# Patient Record
Sex: Female | Born: 1945 | Race: White | Hispanic: No | Marital: Married | Smoking: Former smoker
Health system: Southern US, Community
[De-identification: ages and names within clinical notes are randomized; demographics above are authoritative.]

## PROBLEM LIST (undated history)

## (undated) DIAGNOSIS — I700 Atherosclerosis of aorta: Secondary | ICD-10-CM

## (undated) DIAGNOSIS — R519 Headache, unspecified: Secondary | ICD-10-CM

## (undated) DIAGNOSIS — J189 Pneumonia, unspecified organism: Secondary | ICD-10-CM

## (undated) DIAGNOSIS — H3530 Unspecified macular degeneration: Secondary | ICD-10-CM

## (undated) DIAGNOSIS — Z720 Tobacco use: Secondary | ICD-10-CM

## (undated) DIAGNOSIS — J439 Emphysema, unspecified: Secondary | ICD-10-CM

## (undated) DIAGNOSIS — R0609 Other forms of dyspnea: Secondary | ICD-10-CM

## (undated) DIAGNOSIS — Z8669 Personal history of other diseases of the nervous system and sense organs: Secondary | ICD-10-CM

## (undated) DIAGNOSIS — G43909 Migraine, unspecified, not intractable, without status migrainosus: Secondary | ICD-10-CM

## (undated) DIAGNOSIS — T7840XA Allergy, unspecified, initial encounter: Secondary | ICD-10-CM

## (undated) DIAGNOSIS — F419 Anxiety disorder, unspecified: Secondary | ICD-10-CM

## (undated) DIAGNOSIS — E785 Hyperlipidemia, unspecified: Secondary | ICD-10-CM

## (undated) DIAGNOSIS — F32A Depression, unspecified: Secondary | ICD-10-CM

## (undated) DIAGNOSIS — I10 Essential (primary) hypertension: Secondary | ICD-10-CM

## (undated) DIAGNOSIS — F1910 Other psychoactive substance abuse, uncomplicated: Secondary | ICD-10-CM

## (undated) DIAGNOSIS — F319 Bipolar disorder, unspecified: Secondary | ICD-10-CM

## (undated) DIAGNOSIS — J449 Chronic obstructive pulmonary disease, unspecified: Secondary | ICD-10-CM

## (undated) DIAGNOSIS — M810 Age-related osteoporosis without current pathological fracture: Secondary | ICD-10-CM

## (undated) DIAGNOSIS — I2510 Atherosclerotic heart disease of native coronary artery without angina pectoris: Secondary | ICD-10-CM

## (undated) HISTORY — PX: WISDOM TOOTH EXTRACTION: SHX21

## (undated) HISTORY — PX: DILATION AND CURETTAGE OF UTERUS: SHX78

## (undated) HISTORY — PX: CATARACT EXTRACTION: SUR2

## (undated) HISTORY — PX: WRIST GANGLION EXCISION: SUR520

---

## 2004-09-15 ENCOUNTER — Ambulatory Visit: Payer: Self-pay | Admitting: Family Medicine

## 2005-10-05 ENCOUNTER — Ambulatory Visit: Payer: Self-pay | Admitting: Family Medicine

## 2006-11-08 ENCOUNTER — Ambulatory Visit: Payer: Self-pay | Admitting: Family Medicine

## 2009-01-07 ENCOUNTER — Ambulatory Visit: Payer: Self-pay | Admitting: Family Medicine

## 2010-01-12 ENCOUNTER — Ambulatory Visit: Payer: Self-pay | Admitting: Family Medicine

## 2011-01-18 ENCOUNTER — Ambulatory Visit: Payer: Self-pay | Admitting: Family Medicine

## 2011-01-31 ENCOUNTER — Ambulatory Visit: Payer: Self-pay | Admitting: Gastroenterology

## 2011-02-02 LAB — PATHOLOGY REPORT

## 2011-02-10 ENCOUNTER — Ambulatory Visit: Payer: Self-pay | Admitting: Neurology

## 2012-01-24 ENCOUNTER — Ambulatory Visit: Payer: Self-pay | Admitting: Internal Medicine

## 2012-01-25 ENCOUNTER — Ambulatory Visit: Payer: Self-pay | Admitting: Internal Medicine

## 2013-01-29 ENCOUNTER — Ambulatory Visit: Payer: Self-pay | Admitting: Internal Medicine

## 2013-10-07 DIAGNOSIS — F17200 Nicotine dependence, unspecified, uncomplicated: Secondary | ICD-10-CM | POA: Insufficient documentation

## 2013-10-07 DIAGNOSIS — N951 Menopausal and female climacteric states: Secondary | ICD-10-CM | POA: Insufficient documentation

## 2013-10-07 DIAGNOSIS — I10 Essential (primary) hypertension: Secondary | ICD-10-CM | POA: Insufficient documentation

## 2014-01-30 ENCOUNTER — Ambulatory Visit: Payer: Self-pay | Admitting: Family Medicine

## 2015-08-02 ENCOUNTER — Emergency Department: Payer: Medicare HMO

## 2015-08-02 ENCOUNTER — Emergency Department
Admission: EM | Admit: 2015-08-02 | Discharge: 2015-08-02 | Disposition: A | Discharge status: Home / Self Care | Payer: Medicare HMO | Attending: Emergency Medicine | Admitting: Emergency Medicine

## 2015-08-02 DIAGNOSIS — R000 Tachycardia, unspecified: Secondary | ICD-10-CM | POA: Insufficient documentation

## 2015-08-02 DIAGNOSIS — J159 Unspecified bacterial pneumonia: Secondary | ICD-10-CM | POA: Diagnosis not present

## 2015-08-02 DIAGNOSIS — J189 Pneumonia, unspecified organism: Secondary | ICD-10-CM

## 2015-08-02 DIAGNOSIS — R0602 Shortness of breath: Secondary | ICD-10-CM | POA: Diagnosis present

## 2015-08-02 LAB — COMPREHENSIVE METABOLIC PANEL
ALBUMIN: 4.1 g/dL (ref 3.5–5.0)
ALK PHOS: 85 U/L (ref 38–126)
ALT: 23 U/L (ref 14–54)
AST: 30 U/L (ref 15–41)
Anion gap: 10 (ref 5–15)
BUN: 11 mg/dL (ref 6–20)
CALCIUM: 9.2 mg/dL (ref 8.9–10.3)
CHLORIDE: 98 mmol/L — AB (ref 101–111)
CO2: 26 mmol/L (ref 22–32)
CREATININE: 0.74 mg/dL (ref 0.44–1.00)
GFR calc non Af Amer: >60 mL/min (ref >60)
GLUCOSE: 105 mg/dL — AB (ref 65–99)
Potassium: 4 mmol/L (ref 3.5–5.1)
SODIUM: 134 mmol/L — AB (ref 135–145)
Total Bilirubin: 0.6 mg/dL (ref 0.3–1.2)
Total Protein: 8.2 g/dL — ABNORMAL HIGH (ref 6.5–8.1)

## 2015-08-02 LAB — CBC
HCT: 38.5 % (ref 35.0–47.0)
HEMOGLOBIN: 13.4 g/dL (ref 12.0–16.0)
MCH: 35.1 pg — AB (ref 26.0–34.0)
MCHC: 34.9 g/dL (ref 32.0–36.0)
MCV: 100.7 fL — ABNORMAL HIGH (ref 80.0–100.0)
PLATELETS: 339 10*3/uL (ref 150–440)
RBC: 3.82 MIL/uL (ref 3.80–5.20)
RDW: 14.3 % (ref 11.5–14.5)
WBC: 10.9 10*3/uL (ref 3.6–11.0)

## 2015-08-02 LAB — TROPONIN I: Troponin I: 0.03 ng/mL (ref <0.031)

## 2015-08-02 LAB — BRAIN NATRIURETIC PEPTIDE: B Natriuretic Peptide: 63 pg/mL (ref 0.0–100.0)

## 2015-08-02 MED ORDER — LEVOFLOXACIN IN D5W 500 MG/100ML IV SOLN
500.0000 mg | Freq: Once | INTRAVENOUS | Status: AC
  Administered 2015-08-02: 500 mg via INTRAVENOUS
  Filled 2015-08-02: qty 100

## 2015-08-02 MED ORDER — LEVOFLOXACIN 500 MG PO TABS: 500.0000 mg | ORAL_TABLET | Freq: Every day | ORAL | Status: AC

## 2015-08-02 MED ORDER — IPRATROPIUM-ALBUTEROL 0.5-2.5 (3) MG/3ML IN SOLN
3.0000 mL | Freq: Once | RESPIRATORY_TRACT | Status: AC
  Administered 2015-08-02: 3 mL via RESPIRATORY_TRACT
  Filled 2015-08-02: qty 3

## 2015-08-02 MED ORDER — SODIUM CHLORIDE 0.9 % IV SOLN
1000.0000 mL | Freq: Once | INTRAVENOUS | Status: AC
  Administered 2015-08-02: 1000 mL via INTRAVENOUS

## 2015-08-02 NOTE — Discharge Instructions (Signed)

## 2015-08-02 NOTE — ED Notes (Addendum)
Pt reports since Jan. 5th SOB that increases with activity. Pt also reports heart palpations but denies pain. Denies fever. Cough with green sputum.

## 2015-08-02 NOTE — ED Notes (Signed)
Pt was assisted to the bathroom. 

## 2015-08-02 NOTE — ED Provider Notes (Signed)
[facility] Emergency Department Provider Note  ____________________________________________    I have reviewed the triage vital signs and the nursing notes.   HISTORY  Chief Complaint Shortness of Breath and Palpitations    HPI Taylor Chambers is a 70 y.o. female who presents with cough shortness of breath and fatigue and heart palpitations. She reports the last month she has had upper respiratory infection which her PCP treated with steroids but she reports over the last week she has become worse and become short of breath with ambulation. She denies chest pain. She does report cough. She does smoke cigarettes. Subjective fevers. No recent Travel or calf pain or swelling.     No past medical history on file.  There are no active problems to display for this patient.   No past surgical history on file.  No current outpatient prescriptions on file.  Allergies Review of patient's allergies indicates no known allergies.  No family history on file.  Social History Patient smokes cigarettes, denies alcohol use  Review of Systems  Constitutional: Subjective fever Eyes: Negative for discharge ENT: Negative for sore throat Cardiovascular: Negative for chest pain. Respiratory: Positive for shortness of breath. Positive for cough  Gastrointestinal: Negative for abdominal pain, vomiting  Genitourinary: Negative for dysuria. Musculoskeletal: Negative for back pain. Skin: Negative for rash. Neurological: Negative for headaches     ____________________________________________   PHYSICAL EXAM:  VITAL SIGNS: ED Triage Vitals  Enc Vitals Group     BP 08/02/15 0849 174/71 mmHg     Pulse Rate 08/02/15 0849 125     Resp 08/02/15 0849 22     Temp 08/02/15 0849 98.1 F (36.7 C)     Temp Source 08/02/15 0849 Oral     SpO2 08/02/15 0849 97 %     Weight 08/02/15 0849 100 lb 6 oz (45.53 kg)     Height 08/02/15 0849 5\' 2"  (1.575 m)     Head Cir --       Peak Flow --      Pain Score 08/02/15 0853 9     Pain Loc --      Pain Edu? --      Excl. in [place]? --      Constitutional: Alert and oriented. Well appearing and in no distress. Eyes: Conjunctivae are normal.  ENT   Head: Normocephalic and atraumatic.   Mouth/Throat: Mucous membranes are moist. Cardiovascular: Tachycardia, regular rhythm. Normal and symmetric distal pulses are present in all extremities. No murmurs, rubs, or gallops. Respiratory: Normal respiratory effort without tachypnea nor retractions. Scattered mild wheezes Gastrointestinal: Soft and non-tender in all quadrants. No distention. There is no CVA tenderness. Genitourinary: deferred Musculoskeletal: Nontender with normal range of motion in all extremities. No lower extremity tenderness nor edema. Neurologic:  Normal speech and language. No gross focal neurologic deficits are appreciated. Skin:  Skin is warm, dry and intact. No rash noted. Psychiatric: Mood and affect are normal. Patient exhibits appropriate insight and judgment.  ____________________________________________    LABS (pertinent positives/negatives)  Labs Reviewed  CBC - Abnormal; Notable for the following:    MCV 100.7 (*)    MCH 35.1 (*)    All other components within normal limits  COMPREHENSIVE METABOLIC PANEL  TROPONIN I  BRAIN NATRIURETIC PEPTIDE    ____________________________________________   EKG    ED ECG REPORT I, Lavonia Drafts, the attending physician, personally viewed and interpreted this ECG.  Date: 08/02/2015 EKG Time: 8:57 AM Rate: 114 Rhythm: normal sinus  rhythm QRS Axis: normal Intervals: normal ST/T Wave abnormalities: Nonspecific changes Conduction Disturbances: none Narrative Interpretation: unremarkable   ____________________________________________    RADIOLOGY I have personally reviewed any xrays that were ordered on this patient: Chest x-ray concerning for  pneumonia  ____________________________________________   PROCEDURES  Procedure(s) performed: none  Critical Care performed: none  ____________________________________________   INITIAL IMPRESSION / ASSESSMENT AND PLAN / ED COURSE  Pertinent labs & imaging results that were available during my care of the patient were reviewed by me and considered in my medical decision making (see chart for details).  Patient presents with complaints of symptoms of upper respiratory infection for approximately one month. Appears to worsened over the last week. She does have a severe cough. We will check labs, chest x-ray given concerning for pneumonia versus bronchospasm versus bronchitis.  ----------------------------------------- 10:16 AM on 08/02/2015 -----------------------------------------  Chest x-ray concerning for pneumonia, labs unremarkable. Recommended admission to the patient however she refuses. She wants to try outpatient antibiotics. She did agree to a dose of IV Levaquin in the emergency department.  ----------------------------------------- 11:43 AM on 08/02/2015 -----------------------------------------  Patient has finished IV antibiotics. Her heart rate remains slightly high. I reiterated that I would like her to stay in the hospital and she again declined. We did discuss return precautions at length.  ____________________________________________   FINAL CLINICAL IMPRESSION(S) / ED DIAGNOSES  Final diagnoses:  Community acquired pneumonia     Lavonia Drafts, MD 08/02/15 1147

## 2016-01-26 ENCOUNTER — Other Ambulatory Visit: Payer: Self-pay | Admitting: Internal Medicine

## 2016-01-26 DIAGNOSIS — Z1231 Encounter for screening mammogram for malignant neoplasm of breast: Secondary | ICD-10-CM

## 2016-02-02 ENCOUNTER — Ambulatory Visit
Admission: RE | Admit: 2016-02-02 | Discharge: 2016-02-02 | Disposition: A | Discharge status: Home / Self Care | Payer: Medicare HMO | Source: Ambulatory Visit | Attending: Internal Medicine | Admitting: Internal Medicine

## 2016-02-02 ENCOUNTER — Other Ambulatory Visit: Payer: Self-pay | Admitting: Internal Medicine

## 2016-02-02 DIAGNOSIS — Z1231 Encounter for screening mammogram for malignant neoplasm of breast: Secondary | ICD-10-CM

## 2016-04-05 ENCOUNTER — Telehealth: Payer: Self-pay | Admitting: *Deleted

## 2016-04-05 NOTE — Telephone Encounter (Signed)
Received referral for initial lung cancer screening scan. Contacted patient and obtained smoking history,(current, 45 pack year) as well as answering questions related to screening process. Patient denies signs of lung cancer such as weight loss or hemoptysis. Patient denies comorbidity that would prevent curative treatment if lung cancer were found. Patient is tentatively scheduled for shared decision making visit and CT scan on 04/12/16 at 1:30pm, pending insurance approval from business office.

## 2016-04-11 ENCOUNTER — Other Ambulatory Visit: Payer: Self-pay | Admitting: *Deleted

## 2016-04-11 DIAGNOSIS — Z87891 Personal history of nicotine dependence: Secondary | ICD-10-CM

## 2016-04-12 ENCOUNTER — Inpatient Hospital Stay: Payer: Medicare HMO | Attending: Oncology | Admitting: Oncology

## 2016-04-12 ENCOUNTER — Encounter: Payer: Self-pay | Admitting: Oncology

## 2016-04-12 ENCOUNTER — Ambulatory Visit
Admission: RE | Admit: 2016-04-12 | Discharge: 2016-04-12 | Disposition: A | Discharge status: Home / Self Care | Payer: Medicare HMO | Source: Ambulatory Visit | Attending: Oncology | Admitting: Oncology

## 2016-04-12 DIAGNOSIS — I700 Atherosclerosis of aorta: Secondary | ICD-10-CM | POA: Diagnosis not present

## 2016-04-12 DIAGNOSIS — I2510 Atherosclerotic heart disease of native coronary artery without angina pectoris: Secondary | ICD-10-CM | POA: Insufficient documentation

## 2016-04-12 DIAGNOSIS — Z122 Encounter for screening for malignant neoplasm of respiratory organs: Secondary | ICD-10-CM

## 2016-04-12 DIAGNOSIS — F17210 Nicotine dependence, cigarettes, uncomplicated: Secondary | ICD-10-CM

## 2016-04-12 DIAGNOSIS — Z87891 Personal history of nicotine dependence: Secondary | ICD-10-CM

## 2016-04-12 DIAGNOSIS — J432 Centrilobular emphysema: Secondary | ICD-10-CM | POA: Diagnosis not present

## 2016-04-13 ENCOUNTER — Telehealth: Payer: Self-pay | Admitting: *Deleted

## 2016-04-13 NOTE — Telephone Encounter (Signed)
Notified patient of LDCT lung cancer screening results with recommendation for 1 month follow up imaging. Also notified of incidental finding noted below. Will coordinate 1 month imaging scan. This note will be sent to PCP via Epic. Patient verbalizes understanding.   IMPRESSION: 1. Scattered areas of nodular consolidation are likely infectious/inflammatory, given the presence of peribronchial thickening, scattered peribronchovascular nodularity mucoid impaction. However, malignancy cannot be excluded and based on size, study is categorized as Lung-RADS Category 4B, suspicious. Consider short-term follow-up low-dose CT chest in 3-4 weeks in further initial evaluation. If persistent, pulmonary medicine or thoracic surgery consultation should be considered. These results will be called to the ordering clinician or representative by the Radiologist Assistant, and communication documented in the PACS or zVision Dashboard. 2. Aortic atherosclerosis (ICD10-170.0). Coronary artery calcification. 3.  Emphysema (ICD10-J43.9).

## 2016-04-17 DIAGNOSIS — Z87891 Personal history of nicotine dependence: Secondary | ICD-10-CM | POA: Insufficient documentation

## 2016-04-17 NOTE — Progress Notes (Signed)
In accordance with CMS guidelines, patient has met eligibility criteria including age, absence of signs or symptoms of lung cancer.  Social History  Substance Use Topics  . Smoking status: Current Every Day Smoker    Packs/day: 1.00    Years: 45.00    Types: Cigarettes  . Smokeless tobacco: Not on file  . Alcohol use Not on file     A shared decision-making session was conducted prior to the performance of CT scan. This includes one or more decision aids, includes benefits and harms of screening, follow-up diagnostic testing, over-diagnosis, false positive rate, and total radiation exposure.  Counseling on the importance of adherence to annual lung cancer LDCT screening, impact of co-morbidities, and ability or willingness to undergo diagnosis and treatment is imperative for compliance of the program.  Counseling on the importance of continued smoking cessation for former smokers; the importance of smoking cessation for current smokers, and information about tobacco cessation interventions have been given to patient including [place] and [employer] quit [place] programs.  Written order for lung cancer screening with LDCT has been given to the patient and any and all questions have been answered to the best of my abilities.   Yearly follow up will be coordinated by Burgess Estelle, Thoracic Navigator.

## 2016-05-03 ENCOUNTER — Other Ambulatory Visit: Payer: Self-pay | Admitting: *Deleted

## 2016-05-03 DIAGNOSIS — R9389 Abnormal findings on diagnostic imaging of other specified body structures: Secondary | ICD-10-CM

## 2016-05-10 ENCOUNTER — Ambulatory Visit
Admission: RE | Admit: 2016-05-10 | Discharge: 2016-05-10 | Disposition: A | Discharge status: Home / Self Care | Payer: Medicare HMO | Source: Ambulatory Visit | Attending: Oncology | Admitting: Oncology

## 2016-05-10 ENCOUNTER — Ambulatory Visit: Admission: RE | Admit: 2016-05-10 | Payer: Medicare HMO | Source: Ambulatory Visit

## 2016-05-10 DIAGNOSIS — I2510 Atherosclerotic heart disease of native coronary artery without angina pectoris: Secondary | ICD-10-CM | POA: Insufficient documentation

## 2016-05-10 DIAGNOSIS — R938 Abnormal findings on diagnostic imaging of other specified body structures: Secondary | ICD-10-CM | POA: Insufficient documentation

## 2016-05-10 DIAGNOSIS — I700 Atherosclerosis of aorta: Secondary | ICD-10-CM | POA: Diagnosis not present

## 2016-05-10 DIAGNOSIS — J432 Centrilobular emphysema: Secondary | ICD-10-CM | POA: Diagnosis not present

## 2016-05-10 DIAGNOSIS — R9389 Abnormal findings on diagnostic imaging of other specified body structures: Secondary | ICD-10-CM

## 2016-05-13 ENCOUNTER — Telehealth: Payer: Self-pay | Admitting: *Deleted

## 2016-05-13 NOTE — Telephone Encounter (Signed)
Notified patient of LDCT lung cancer screening results with recommendation for 6 month follow up imaging. Also notified of incidental finding noted below. Patient verbalizes understanding. This note will be forwarded to PCP via Epic.  IMPRESSION: 1. Lung-Rads category 3, probably benign findings. Short-term follow-up in 6 months is recommended with repeat low-dose chest CT without contrast (please use the following order, "CT CHEST LCS NODULE FOLLOW-UP W/O CM"). Improvement in multifocal pulmonary opacities which are likely post infectious or inflammatory. 2. Underlying centrilobular emphysema. 3.  Coronary artery atherosclerosis. Aortic atherosclerosis.

## 2016-11-03 ENCOUNTER — Telehealth: Payer: Self-pay | Admitting: *Deleted

## 2016-11-03 NOTE — Telephone Encounter (Signed)
Left message for patient to notify them that it is time to schedule annual low dose lung cancer screening CT scan. Instructed patient to call back to verify information prior to the scan being scheduled.  

## 2016-11-04 ENCOUNTER — Telehealth: Payer: Self-pay | Admitting: *Deleted

## 2016-11-04 DIAGNOSIS — R9389 Abnormal findings on diagnostic imaging of other specified body structures: Secondary | ICD-10-CM

## 2016-11-04 NOTE — Telephone Encounter (Signed)
Notified patient that annual lung cancer screening low dose CT scan is due currently or will be in near future. Confirmed that patient is within the age range of 55-77, and asymptomatic, (no signs or symptoms of lung cancer). Patient denies illness that would prevent curative treatment for lung cancer if found. Verified smoking history, (current, 45.5 pack year). The shared decision making visit was done 04/12/16. Patient is agreeable for CT scan being scheduled.

## 2016-11-23 ENCOUNTER — Ambulatory Visit
Admission: RE | Admit: 2016-11-23 | Discharge: 2016-11-23 | Disposition: A | Discharge status: Home / Self Care | Payer: Medicare HMO | Source: Ambulatory Visit | Attending: Oncology | Admitting: Oncology

## 2016-11-23 ENCOUNTER — Ambulatory Visit: Payer: Medicare HMO

## 2016-11-23 DIAGNOSIS — I700 Atherosclerosis of aorta: Secondary | ICD-10-CM | POA: Insufficient documentation

## 2016-11-23 DIAGNOSIS — R938 Abnormal findings on diagnostic imaging of other specified body structures: Secondary | ICD-10-CM | POA: Diagnosis not present

## 2016-11-23 DIAGNOSIS — I2510 Atherosclerotic heart disease of native coronary artery without angina pectoris: Secondary | ICD-10-CM | POA: Diagnosis not present

## 2016-11-23 DIAGNOSIS — J432 Centrilobular emphysema: Secondary | ICD-10-CM | POA: Insufficient documentation

## 2016-11-23 DIAGNOSIS — R9389 Abnormal findings on diagnostic imaging of other specified body structures: Secondary | ICD-10-CM

## 2016-11-28 ENCOUNTER — Encounter: Payer: Self-pay | Admitting: *Deleted

## 2017-03-16 ENCOUNTER — Other Ambulatory Visit: Payer: Self-pay | Admitting: Ophthalmology

## 2017-03-16 DIAGNOSIS — G453 Amaurosis fugax: Secondary | ICD-10-CM

## 2017-03-22 ENCOUNTER — Ambulatory Visit
Admission: RE | Admit: 2017-03-22 | Discharge: 2017-03-22 | Disposition: A | Discharge status: Home / Self Care | Payer: Medicare HMO | Source: Ambulatory Visit | Attending: Ophthalmology | Admitting: Ophthalmology

## 2017-03-22 DIAGNOSIS — G453 Amaurosis fugax: Secondary | ICD-10-CM | POA: Diagnosis present

## 2017-03-22 LAB — POCT I-STAT CREATININE: Creatinine, Ser: 1.1 mg/dL — ABNORMAL HIGH (ref 0.44–1.00)

## 2017-03-22 MED ORDER — GADOBENATE DIMEGLUMINE 529 MG/ML IV SOLN
10.0000 mL | Freq: Once | INTRAVENOUS | Status: AC | PRN
  Administered 2017-03-22: 10 mL via INTRAVENOUS

## 2017-11-14 ENCOUNTER — Telehealth: Payer: Self-pay | Admitting: *Deleted

## 2017-11-14 DIAGNOSIS — Z87891 Personal history of nicotine dependence: Secondary | ICD-10-CM

## 2017-11-14 DIAGNOSIS — Z122 Encounter for screening for malignant neoplasm of respiratory organs: Secondary | ICD-10-CM

## 2017-11-14 NOTE — Telephone Encounter (Signed)
Notified patient that annual lung cancer screening low dose CT scan is due currently or will be in near future. Confirmed that patient is within the age range of 55-77, and asymptomatic, (no signs or symptoms of lung cancer). Patient denies illness that would prevent curative treatment for lung cancer if found. Verified smoking history, (former, quit 08/04/17). The shared decision making visit was done 04/12/16. Patient is agreeable for CT scan being scheduled.

## 2017-11-15 ENCOUNTER — Telehealth: Payer: Self-pay | Admitting: *Deleted

## 2017-11-15 NOTE — Telephone Encounter (Signed)
Pt notified and appt details mailed. Also received email.

## 2017-11-27 ENCOUNTER — Ambulatory Visit
Admission: RE | Admit: 2017-11-27 | Discharge: 2017-11-27 | Disposition: A | Discharge status: Home / Self Care | Payer: Medicare HMO | Source: Ambulatory Visit | Attending: Oncology | Admitting: Oncology

## 2017-11-27 DIAGNOSIS — I2510 Atherosclerotic heart disease of native coronary artery without angina pectoris: Secondary | ICD-10-CM | POA: Diagnosis not present

## 2017-11-27 DIAGNOSIS — Z87891 Personal history of nicotine dependence: Secondary | ICD-10-CM | POA: Diagnosis not present

## 2017-11-27 DIAGNOSIS — J439 Emphysema, unspecified: Secondary | ICD-10-CM | POA: Diagnosis not present

## 2017-11-27 DIAGNOSIS — Z122 Encounter for screening for malignant neoplasm of respiratory organs: Secondary | ICD-10-CM | POA: Insufficient documentation

## 2017-11-27 DIAGNOSIS — I700 Atherosclerosis of aorta: Secondary | ICD-10-CM | POA: Insufficient documentation

## 2017-12-04 ENCOUNTER — Encounter: Payer: Self-pay | Admitting: *Deleted

## 2018-12-05 ENCOUNTER — Telehealth: Payer: Self-pay | Admitting: *Deleted

## 2018-12-05 DIAGNOSIS — Z87891 Personal history of nicotine dependence: Secondary | ICD-10-CM

## 2018-12-05 DIAGNOSIS — Z122 Encounter for screening for malignant neoplasm of respiratory organs: Secondary | ICD-10-CM

## 2018-12-05 NOTE — Telephone Encounter (Signed)
Patient has been notified that annual lung cancer screening low dose CT scan is due currently or will be in near future. Confirmed that patient is within the age range of 55-77, and asymptomatic, (no signs or symptoms of lung cancer). Patient denies illness that would prevent curative treatment for lung cancer if found. Verified smoking history, (current, 46 pack year). The shared decision making visit was done 04/12/16. Patient is agreeable for CT scan being scheduled.

## 2018-12-11 ENCOUNTER — Ambulatory Visit
Admission: RE | Admit: 2018-12-11 | Discharge: 2018-12-11 | Disposition: A | Discharge status: Home / Self Care | Payer: Medicare HMO | Source: Ambulatory Visit | Attending: Oncology | Admitting: Oncology

## 2018-12-11 ENCOUNTER — Other Ambulatory Visit: Payer: Self-pay

## 2018-12-11 DIAGNOSIS — Z122 Encounter for screening for malignant neoplasm of respiratory organs: Secondary | ICD-10-CM | POA: Diagnosis not present

## 2018-12-11 DIAGNOSIS — Z87891 Personal history of nicotine dependence: Secondary | ICD-10-CM

## 2018-12-12 ENCOUNTER — Encounter: Payer: Self-pay | Admitting: *Deleted

## 2019-02-05 DIAGNOSIS — J432 Centrilobular emphysema: Secondary | ICD-10-CM | POA: Insufficient documentation

## 2019-02-06 ENCOUNTER — Other Ambulatory Visit: Payer: Self-pay | Admitting: Infectious Diseases

## 2019-02-06 DIAGNOSIS — Z1231 Encounter for screening mammogram for malignant neoplasm of breast: Secondary | ICD-10-CM

## 2019-03-15 ENCOUNTER — Ambulatory Visit
Admission: RE | Admit: 2019-03-15 | Discharge: 2019-03-15 | Disposition: A | Discharge status: Home / Self Care | Payer: Medicare HMO | Source: Ambulatory Visit | Attending: Infectious Diseases | Admitting: Infectious Diseases

## 2019-03-15 DIAGNOSIS — Z1231 Encounter for screening mammogram for malignant neoplasm of breast: Secondary | ICD-10-CM | POA: Insufficient documentation

## 2019-12-06 ENCOUNTER — Telehealth: Payer: Self-pay

## 2019-12-06 DIAGNOSIS — Z122 Encounter for screening for malignant neoplasm of respiratory organs: Secondary | ICD-10-CM

## 2019-12-06 DIAGNOSIS — Z87891 Personal history of nicotine dependence: Secondary | ICD-10-CM

## 2019-12-06 NOTE — Telephone Encounter (Signed)
Patient has been notified that the low dose lung cancer screening CT scan is due currently or will be in near future.  Confirmed that patient is within the appropriate age range and asymptomatic, (no signs or symptoms of lung cancer).  Patient denies illness that would prevent curative treatment for lung cancer if found.  Patient is agreeable for CT scan being scheduled.    Verified smoking history (current smoker, with 46 year 1 ppd history).   CT scheduled for 12/17/19 @ 9:30

## 2019-12-10 NOTE — Telephone Encounter (Signed)
Smoking history: current, 47 pack year

## 2019-12-10 NOTE — Addendum Note (Signed)
Addended by: Lieutenant Diego on: 12/10/2019 02:31 PM   Modules accepted: Orders

## 2019-12-17 ENCOUNTER — Other Ambulatory Visit: Payer: Self-pay

## 2019-12-17 ENCOUNTER — Ambulatory Visit
Admission: RE | Admit: 2019-12-17 | Discharge: 2019-12-17 | Disposition: A | Discharge status: Home / Self Care | Payer: Medicare HMO | Source: Ambulatory Visit | Attending: Oncology | Admitting: Oncology

## 2019-12-17 DIAGNOSIS — Z122 Encounter for screening for malignant neoplasm of respiratory organs: Secondary | ICD-10-CM | POA: Diagnosis present

## 2019-12-17 DIAGNOSIS — Z87891 Personal history of nicotine dependence: Secondary | ICD-10-CM | POA: Insufficient documentation

## 2019-12-20 ENCOUNTER — Encounter: Payer: Self-pay | Admitting: *Deleted

## 2020-02-24 DIAGNOSIS — I5189 Other ill-defined heart diseases: Secondary | ICD-10-CM

## 2020-02-24 HISTORY — DX: Other ill-defined heart diseases: I51.89

## 2020-03-25 ENCOUNTER — Other Ambulatory Visit: Payer: Self-pay | Admitting: Cardiovascular Disease

## 2020-03-25 ENCOUNTER — Other Ambulatory Visit: Payer: Self-pay | Admitting: Infectious Diseases

## 2020-03-25 DIAGNOSIS — Z1231 Encounter for screening mammogram for malignant neoplasm of breast: Secondary | ICD-10-CM

## 2020-04-29 ENCOUNTER — Other Ambulatory Visit: Payer: Self-pay

## 2020-04-29 ENCOUNTER — Ambulatory Visit
Admission: RE | Admit: 2020-04-29 | Discharge: 2020-04-29 | Disposition: A | Discharge status: Home / Self Care | Payer: Medicare HMO | Source: Ambulatory Visit | Attending: Infectious Diseases | Admitting: Infectious Diseases

## 2020-04-29 DIAGNOSIS — Z1231 Encounter for screening mammogram for malignant neoplasm of breast: Secondary | ICD-10-CM | POA: Diagnosis not present

## 2021-02-23 ENCOUNTER — Other Ambulatory Visit
Admission: RE | Admit: 2021-02-23 | Discharge: 2021-02-23 | Disposition: A | Discharge status: Home / Self Care | Payer: Medicare HMO | Attending: Ophthalmology | Admitting: Ophthalmology

## 2021-02-23 ENCOUNTER — Other Ambulatory Visit: Payer: Self-pay | Admitting: Ophthalmology

## 2021-02-23 DIAGNOSIS — H5462 Unqualified visual loss, left eye, normal vision right eye: Secondary | ICD-10-CM | POA: Diagnosis present

## 2021-02-23 LAB — CBC WITH DIFFERENTIAL/PLATELET
Abs Immature Granulocytes: 0.03 10*3/uL (ref 0.00–0.07)
Basophils Absolute: 0.1 10*3/uL (ref 0.0–0.1)
Basophils Relative: 1 %
Eosinophils Absolute: 0.4 10*3/uL (ref 0.0–0.5)
Eosinophils Relative: 4 %
HCT: 40.7 % (ref 36.0–46.0)
Hemoglobin: 14 g/dL (ref 12.0–15.0)
Immature Granulocytes: 0 %
Lymphocytes Relative: 24 %
Lymphs Abs: 2.2 10*3/uL (ref 0.7–4.0)
MCH: 34.1 pg — ABNORMAL HIGH (ref 26.0–34.0)
MCHC: 34.4 g/dL (ref 30.0–36.0)
MCV: 99 fL (ref 80.0–100.0)
Monocytes Absolute: 0.8 10*3/uL (ref 0.1–1.0)
Monocytes Relative: 9 %
Neutro Abs: 5.8 10*3/uL (ref 1.7–7.7)
Neutrophils Relative %: 62 %
Platelets: 242 10*3/uL (ref 150–400)
RBC: 4.11 MIL/uL (ref 3.87–5.11)
RDW: 12 % (ref 11.5–15.5)
WBC: 9.3 10*3/uL (ref 4.0–10.5)
nRBC: 0 % (ref 0.0–0.2)

## 2021-02-23 LAB — C-REACTIVE PROTEIN: CRP: 0.5 mg/dL (ref <1.0)

## 2021-02-23 LAB — SEDIMENTATION RATE: Sed Rate: 12 mm/hr (ref 0–30)

## 2021-03-09 ENCOUNTER — Ambulatory Visit
Admission: RE | Admit: 2021-03-09 | Discharge: 2021-03-09 | Disposition: A | Discharge status: Home / Self Care | Payer: Medicare HMO | Source: Ambulatory Visit | Attending: Ophthalmology | Admitting: Ophthalmology

## 2021-03-09 ENCOUNTER — Other Ambulatory Visit: Payer: Self-pay

## 2021-03-09 DIAGNOSIS — H5462 Unqualified visual loss, left eye, normal vision right eye: Secondary | ICD-10-CM | POA: Insufficient documentation

## 2021-03-09 MED ORDER — GADOBUTROL 1 MMOL/ML IV SOLN
5.0000 mL | Freq: Once | INTRAVENOUS | Status: AC | PRN
  Administered 2021-03-09: 5 mL via INTRAVENOUS

## 2021-03-10 ENCOUNTER — Other Ambulatory Visit: Payer: Self-pay | Admitting: *Deleted

## 2021-03-10 DIAGNOSIS — Z87891 Personal history of nicotine dependence: Secondary | ICD-10-CM

## 2021-03-10 DIAGNOSIS — F17210 Nicotine dependence, cigarettes, uncomplicated: Secondary | ICD-10-CM

## 2021-03-26 ENCOUNTER — Other Ambulatory Visit: Payer: Self-pay | Admitting: Infectious Diseases

## 2021-03-26 DIAGNOSIS — Z1231 Encounter for screening mammogram for malignant neoplasm of breast: Secondary | ICD-10-CM

## 2021-03-30 ENCOUNTER — Ambulatory Visit
Admission: RE | Admit: 2021-03-30 | Discharge: 2021-03-30 | Disposition: A | Discharge status: Home / Self Care | Payer: Medicare HMO | Source: Ambulatory Visit | Attending: Acute Care | Admitting: Acute Care

## 2021-03-30 ENCOUNTER — Other Ambulatory Visit: Payer: Self-pay

## 2021-03-30 DIAGNOSIS — F17210 Nicotine dependence, cigarettes, uncomplicated: Secondary | ICD-10-CM | POA: Insufficient documentation

## 2021-03-30 DIAGNOSIS — Z87891 Personal history of nicotine dependence: Secondary | ICD-10-CM | POA: Insufficient documentation

## 2021-04-01 ENCOUNTER — Other Ambulatory Visit: Payer: Self-pay | Admitting: *Deleted

## 2021-04-01 DIAGNOSIS — F17210 Nicotine dependence, cigarettes, uncomplicated: Secondary | ICD-10-CM

## 2021-04-01 DIAGNOSIS — Z87891 Personal history of nicotine dependence: Secondary | ICD-10-CM

## 2021-04-01 NOTE — Progress Notes (Signed)
I have called the patient with the results of her low dose CT Chest. I explained her scan was read as a Lung RADS 4 A : suspicious findings, either short term follow up in 3 months or alternatively  PET Scan evaluation may be considered when there is a solid component of  8 mm or larger. She is in agreement with a 3 month follow up and knows we will call closer to January 2023 to get this scheduled . She verbalized understanding. Langley Gauss, please fax results to PCP and order 3 month follow up scan. Thanks so much

## 2021-06-24 DIAGNOSIS — Z87898 Personal history of other specified conditions: Secondary | ICD-10-CM | POA: Insufficient documentation

## 2021-07-16 ENCOUNTER — Other Ambulatory Visit: Payer: Self-pay

## 2021-07-16 ENCOUNTER — Ambulatory Visit
Admission: RE | Admit: 2021-07-16 | Discharge: 2021-07-16 | Disposition: A | Discharge status: Home / Self Care | Payer: Medicare HMO | Source: Ambulatory Visit | Attending: Acute Care | Admitting: Acute Care

## 2021-07-16 DIAGNOSIS — F17210 Nicotine dependence, cigarettes, uncomplicated: Secondary | ICD-10-CM | POA: Insufficient documentation

## 2021-07-16 DIAGNOSIS — Z87891 Personal history of nicotine dependence: Secondary | ICD-10-CM | POA: Insufficient documentation

## 2021-08-12 ENCOUNTER — Other Ambulatory Visit: Payer: Self-pay | Admitting: Acute Care

## 2021-08-12 ENCOUNTER — Telehealth: Payer: Self-pay | Admitting: Acute Care

## 2021-08-12 DIAGNOSIS — Z87891 Personal history of nicotine dependence: Secondary | ICD-10-CM

## 2021-08-12 DIAGNOSIS — F17210 Nicotine dependence, cigarettes, uncomplicated: Secondary | ICD-10-CM

## 2021-08-12 NOTE — Telephone Encounter (Signed)
I  called patient / send result letter and let them  know their  low dose Ct was read as a Lung  RADS 3, nodules that are probably benign findings, short term follow up suggested: includes nodules with a low likelihood of becoming a clinically active cancer. Radiology recommends a 6 month repeat LDCT follow up. I  let them  know we will order and schedule their  follow up  screening scan for 01/2022.  ?Pt.  is  currently on statin therapy. Please place order for 6 month follow up  screening scan for  01/2022 and fax results to PCP. Thanks so much.  ?

## 2021-08-12 NOTE — Telephone Encounter (Signed)
CT results faxed to PCP with f/u plan included. Order placed for 6 mth nodule f/u CT ?  ?

## 2021-08-23 ENCOUNTER — Other Ambulatory Visit: Payer: Self-pay | Admitting: Physician Assistant

## 2021-08-23 DIAGNOSIS — M1611 Unilateral primary osteoarthritis, right hip: Secondary | ICD-10-CM

## 2021-08-27 ENCOUNTER — Encounter: Payer: Self-pay | Admitting: Gastroenterology

## 2021-08-29 ENCOUNTER — Other Ambulatory Visit: Payer: Self-pay

## 2021-08-29 ENCOUNTER — Ambulatory Visit
Admission: RE | Admit: 2021-08-29 | Discharge: 2021-08-29 | Disposition: A | Discharge status: Home / Self Care | Payer: Medicare HMO | Source: Ambulatory Visit | Attending: Physician Assistant | Admitting: Physician Assistant

## 2021-08-29 DIAGNOSIS — M1611 Unilateral primary osteoarthritis, right hip: Secondary | ICD-10-CM | POA: Diagnosis present

## 2021-08-30 ENCOUNTER — Encounter
Admission: RE | Disposition: A | Discharge status: Home / Self Care | Payer: Self-pay | Source: Ambulatory Visit | Attending: Gastroenterology

## 2021-08-30 ENCOUNTER — Ambulatory Visit
Admission: RE | Admit: 2021-08-30 | Discharge: 2021-08-30 | Disposition: A | Discharge status: Home / Self Care | Payer: Medicare HMO | Source: Ambulatory Visit | Attending: Gastroenterology | Admitting: Gastroenterology

## 2021-08-30 ENCOUNTER — Encounter: Payer: Self-pay | Admitting: Gastroenterology

## 2021-08-30 ENCOUNTER — Ambulatory Visit: Payer: Medicare HMO | Admitting: Certified Registered"

## 2021-08-30 DIAGNOSIS — I10 Essential (primary) hypertension: Secondary | ICD-10-CM | POA: Insufficient documentation

## 2021-08-30 DIAGNOSIS — J439 Emphysema, unspecified: Secondary | ICD-10-CM | POA: Insufficient documentation

## 2021-08-30 DIAGNOSIS — M810 Age-related osteoporosis without current pathological fracture: Secondary | ICD-10-CM | POA: Diagnosis not present

## 2021-08-30 DIAGNOSIS — K640 First degree hemorrhoids: Secondary | ICD-10-CM | POA: Insufficient documentation

## 2021-08-30 DIAGNOSIS — K621 Rectal polyp: Secondary | ICD-10-CM | POA: Diagnosis not present

## 2021-08-30 DIAGNOSIS — I340 Nonrheumatic mitral (valve) insufficiency: Secondary | ICD-10-CM | POA: Insufficient documentation

## 2021-08-30 DIAGNOSIS — Z1211 Encounter for screening for malignant neoplasm of colon: Secondary | ICD-10-CM | POA: Insufficient documentation

## 2021-08-30 DIAGNOSIS — D120 Benign neoplasm of cecum: Secondary | ICD-10-CM | POA: Diagnosis not present

## 2021-08-30 DIAGNOSIS — F419 Anxiety disorder, unspecified: Secondary | ICD-10-CM | POA: Diagnosis not present

## 2021-08-30 DIAGNOSIS — Z87891 Personal history of nicotine dependence: Secondary | ICD-10-CM | POA: Insufficient documentation

## 2021-08-30 DIAGNOSIS — F32A Depression, unspecified: Secondary | ICD-10-CM | POA: Insufficient documentation

## 2021-08-30 HISTORY — DX: Depression, unspecified: F32.A

## 2021-08-30 HISTORY — PX: COLONOSCOPY: SHX5424

## 2021-08-30 HISTORY — DX: Anxiety disorder, unspecified: F41.9

## 2021-08-30 HISTORY — DX: Emphysema, unspecified: J43.9

## 2021-08-30 HISTORY — DX: Other psychoactive substance abuse, uncomplicated: F19.10

## 2021-08-30 HISTORY — DX: Age-related osteoporosis without current pathological fracture: M81.0

## 2021-08-30 HISTORY — DX: Personal history of other diseases of the nervous system and sense organs: Z86.69

## 2021-08-30 HISTORY — DX: Essential (primary) hypertension: I10

## 2021-08-30 SURGERY — COLONOSCOPY
Anesthesia: General

## 2021-08-30 MED ORDER — PROPOFOL 10 MG/ML IV BOLUS
INTRAVENOUS | Status: AC
  Filled 2021-08-30: qty 20

## 2021-08-30 MED ORDER — LIDOCAINE HCL (CARDIAC) PF 100 MG/5ML IV SOSY
PREFILLED_SYRINGE | INTRAVENOUS | Status: DC | PRN
Start: 2021-08-30 — End: 2021-08-30
  Administered 2021-08-30: 100 mg via INTRAVENOUS

## 2021-08-30 MED ORDER — SODIUM CHLORIDE 0.9 % IV SOLN
INTRAVENOUS | Status: DC
  Administered 2021-08-30: 20 mL/h via INTRAVENOUS

## 2021-08-30 MED ORDER — PROPOFOL 10 MG/ML IV BOLUS
INTRAVENOUS | Status: DC | PRN
  Administered 2021-08-30: 80 mg via INTRAVENOUS
  Administered 2021-08-30: 140 ug/kg/min via INTRAVENOUS

## 2021-08-30 MED ORDER — LIDOCAINE HCL (PF) 2 % IJ SOLN
INTRAMUSCULAR | Status: AC
  Filled 2021-08-30: qty 5

## 2021-08-30 NOTE — Op Note (Signed)
[facility] ?Gastroenterology ?Patient Name: Taylor Chambers ?Procedure Date: 08/30/2021 8:17 AM ?MRN: [id number] ?Account #: [id number] ?Date of Birth: July 18, 1945 ?Admit Type: Outpatient ?Age: 76 ?Room: [facility] ENDO ROOM 2 ?Gender: Female ?Note Status: Finalized ?Instrument Name: Peds Colonoscope [id number] ?Procedure:             Colonoscopy ?Indications:           Screening for colorectal malignant neoplasm ?Providers:             Annamaria Helling DO, DO ?Medicines:             Monitored Anesthesia Care ?Complications:         No immediate complications. Estimated blood loss:  ?                       Minimal. ?Procedure:             Pre-Anesthesia Assessment: ?                       - Prior to the procedure, a History and Physical was  ?                       performed, and patient medications and allergies were  ?                       reviewed. The patient is competent. The risks and  ?                       benefits of the procedure and the sedation options and  ?                       risks were discussed with the patient. All questions  ?                       were answered and informed consent was obtained.  ?                       Patient identification and proposed procedure were  ?                       verified by the physician, the nurse, the anesthetist  ?                       and the technician in the endoscopy suite. Mental  ?                       Status Examination: alert and oriented. Airway  ?                       Examination: normal oropharyngeal airway and neck  ?                       mobility. Respiratory Examination: clear to  ?                       auscultation. CV Examination: RRR, no murmurs, no S3  ?                       or S4. Prophylactic Antibiotics: The patient does  not  ?                       require prophylactic antibiotics. Prior  ?                       Anticoagulants: The patient has taken no previous  ?                       anticoagulant or antiplatelet  agents. ASA Grade  ?                       Assessment: III - A patient with severe systemic  ?                       disease. After reviewing the risks and benefits, the  ?                       patient was deemed in satisfactory condition to  ?                       undergo the procedure. The anesthesia plan was to use  ?                       monitored anesthesia care (MAC). Immediately prior to  ?                       administration of medications, the patient was  ?                       re-assessed for adequacy to receive sedatives. The  ?                       heart rate, respiratory rate, oxygen saturations,  ?                       blood pressure, adequacy of pulmonary ventilation, and  ?                       response to care were monitored throughout the  ?                       procedure. The physical status of the patient was  ?                       re-assessed after the procedure. ?                       After obtaining informed consent, the colonoscope was  ?                       passed under direct vision. Throughout the procedure,  ?                       the patient's blood pressure, pulse, and oxygen  ?                       saturations were monitored continuously. The  ?  Colonoscope was introduced through the anus and  ?                       advanced to the the cecum, identified by appendiceal  ?                       orifice and ileocecal valve. The colonoscopy was  ?                       performed without difficulty. The patient tolerated  ?                       the procedure well. The quality of the bowel  ?                       preparation was evaluated using the BBPS [facility] Bowel  ?                       Preparation Scale) with scores of: Right Colon = 3,  ?                       Transverse Colon = 3 and Left Colon = 3 (entire mucosa  ?                       seen well with no residual staining, small fragments  ?                       of stool or opaque liquid).  The total BBPS score  ?                       equals 9. The ileocecal valve, appendiceal orifice,  ?                       and rectum were photographed. ?Findings: ?     The perianal and digital rectal examinations were normal. Pertinent  ?     negatives include normal sphincter tone. ?     Non-bleeding internal hemorrhoids were found during retroflexion. The  ?     hemorrhoids were Grade I (internal hemorrhoids that do not prolapse).  ?     Estimated blood loss: none. ?     A 1 to 2 mm polyp was found in the rectum. The polyp was sessile. The  ?     polyp was removed with a cold biopsy forceps. Resection and retrieval  ?     were complete. Estimated blood loss was minimal. ?     A 3 to 4 mm polyp was found in the cecum. The polyp was sessile. The  ?     polyp was removed with a cold snare. Resection and retrieval were  ?     complete. Estimated blood loss was minimal. ?     The exam was otherwise without abnormality on direct and retroflexion  ?     views. ?Impression:            - Non-bleeding internal hemorrhoids. ?                       - One 1 to 2 mm polyp in the rectum, removed with a  ?  cold biopsy forceps. Resected and retrieved. ?                       - One 3 to 4 mm polyp in the cecum, removed with a  ?                       cold snare. Resected and retrieved. ?                       - The examination was otherwise normal on direct and  ?                       retroflexion views. ?Recommendation:        - Discharge patient to home. ?                       - Resume previous diet. ?                       - No aspirin, ibuprofen, naproxen, or other  ?                       non-steroidal anti-inflammatory drugs for 5 days after  ?                       polyp removal. ?                       - Continue present medications. ?                       - Recommend starting proton pump inhibitor over the  ?                       counter such as omeprazole or esomeprazole, etc. if  ?                        continuing to take non-steroidal anti-inflammatories  ?                       long term. ?                       - Await pathology results. ?                       - Repeat colonoscopy for surveillance based on  ?                       pathology results. ?                       - Return to referring physician as previously  ?                       scheduled. ?                       - The findings and recommendations were discussed with  ?                       the patient. ?Procedure Code(s):     ---  Professional --- ?                       [id number], Colonoscopy, flexible; with removal of  ?                       tumor(s), polyp(s), or other lesion(s) by snare  ?                       technique ?                       45380, 59, Colonoscopy, flexible; with biopsy, single  ?                       or multiple ?Diagnosis Code(s):     --- Professional --- ?                       Z12.11, Encounter for screening for malignant neoplasm  ?                       of colon ?                       K62.1, Rectal polyp ?                       K63.5, Polyp of colon ?                       K64.0, First degree hemorrhoids ?CPT copyright 2019 American Medical Association. All rights reserved. ?The codes documented in this report are preliminary and upon coder review may  ?be revised to meet current compliance requirements. ?Attending Participation: ?     I personally performed the entire procedure. ?Volney American, DO ?Annamaria Helling DO, DO ?08/30/2021 9:38:31 AM ?This report has been signed electronically. ?Number of Addenda: 0 ?Note Initiated On: 08/30/2021 8:17 AM ?Scope Withdrawal Time: 0 hours 16 minutes 24 seconds  ?Total Procedure Duration: 0 hours 24 minutes 52 seconds  ?Estimated Blood Loss:  Estimated blood loss was minimal. ?     [facility] ?

## 2021-08-30 NOTE — Anesthesia Postprocedure Evaluation (Signed)
Anesthesia Post Note ? ?Patient: Taylor Chambers ? ?Procedure(s) Performed: COLONOSCOPY ? ?Patient location during evaluation: Endoscopy ?Anesthesia Type: General ?Level of consciousness: awake and alert ?Pain management: pain level controlled ?Vital Signs Assessment: post-procedure vital signs reviewed and stable ?Respiratory status: spontaneous breathing, nonlabored ventilation, respiratory function stable and patient connected to nasal cannula oxygen ?Cardiovascular status: blood pressure returned to baseline and stable ?Postop Assessment: no apparent nausea or vomiting ?Anesthetic complications: no ? ? ?No notable events documented. ? ? ?Last Vitals:  ?Vitals:  ? 08/30/21 0953 08/30/21 0959  ?BP:  (!) 150/84  ?Pulse: 76 77  ?Resp: 20 (!) 21  ?Temp:    ?SpO2: 100% 99%  ?  ?Last Pain:  ?Vitals:  ? 08/30/21 0936  ?TempSrc: Temporal  ?PainSc:   ? ? ?  ?  ?  ?  ?  ?  ? ?Martha Clan ? ? ? ? ?

## 2021-08-30 NOTE — Transfer of Care (Signed)
Immediate Anesthesia Transfer of Care Note ? ?Patient: Taylor Chambers ? ?Procedure(s) Performed: COLONOSCOPY ? ?Patient Location: PACU ? ?Anesthesia Type:General ? ?Level of Consciousness: awake ? ?Airway & Oxygen Therapy: Patient Spontanous Breathing ? ?Post-op Assessment: Report given to RN and Post -op Vital signs reviewed and stable ? ?Post vital signs: Reviewed and stable ? ?Last Vitals:  ?Vitals Value Taken Time  ?BP 108/48 08/30/21 0936  ?Temp 35.8   ?Pulse 72 08/30/21 0936  ?Resp 26 08/30/21 0936  ?SpO2 100 % 08/30/21 0936  ?Vitals shown include unvalidated device data. ? ?Last Pain:  ?Vitals:  ? 08/30/21 0840  ?TempSrc: Temporal  ?PainSc: 7   ?   ? ?  ? ?Complications: No notable events documented. ?

## 2021-08-30 NOTE — H&P (Signed)
B   ? ?Pre-Procedure H&P ?  ?Patient ID: Taylor Chambers is a 76 y.o. female. ? ?Gastroenterology Provider: Annamaria Helling, DO ? ?Referring Provider: Dawson Bills, NP ?PCP: Leonel Ramsay, MD ? ?Date: 08/30/2021 ? ?HPI ?Taylor Chambers is a 76 y.o. female who presents today for Colonoscopy for Screening colonoscopy. ? ?Patient with history of mitral valve insufficiency, tobacco use who presents for screening colonoscopy. ? ?Bowel movements have been normal without melena hematochezia diarrhea or constipation. ? ?Most recent lab work hemoglobin 13.5 MCV 99.8 platelets 21,000 creatinine 0.6.  Recent echocardiogram with 55% ejection fraction and mitral valve insufficiency. ? ?EGD and colonoscopy in 2012.  Each of these were normal per report ? ?Taking two aleve tablets twice a day ? ?No family history of colorectal cancer or polyps ? ?Past Medical History:  ?Diagnosis Date  ? Anxiety   ? Depression   ? Emphysema lung ([place])   ? History of cataract   ? Hypertension   ? Osteoporosis   ? Substance abuse ([place])   ? tobacco user  ? ? ?Past Surgical History:  ?Procedure Laterality Date  ? CATARACT EXTRACTION    ? DILATION AND CURETTAGE OF UTERUS    ? WISDOM TOOTH EXTRACTION    ? WRIST GANGLION EXCISION    ? ? ?Family History ?No h/o GI disease or malignancy ? ?Review of Systems  ?Constitutional:  Negative for activity change, appetite change, chills, diaphoresis, fatigue, fever and unexpected weight change.  ?HENT:  Negative for trouble swallowing and voice change.   ?Respiratory:  Negative for shortness of breath and wheezing.   ?Cardiovascular:  Negative for chest pain, palpitations and leg swelling.  ?Gastrointestinal:  Negative for abdominal distention, abdominal pain, anal bleeding, blood in stool, constipation, diarrhea, nausea, rectal pain and vomiting.  ?Musculoskeletal:  Negative for arthralgias and myalgias.  ?Skin:  Negative for color change and pallor.  ?Neurological:  Negative for dizziness, syncope and  weakness.  ?Psychiatric/Behavioral:  Negative for confusion.   ?All other systems reviewed and are negative.  ? ?Medications ?No current facility-administered medications on file prior to encounter.  ? ?Current Outpatient Medications on File Prior to Encounter  ?Medication Sig Dispense Refill  ? alendronate (FOSAMAX) 70 MG tablet Take 70 mg by mouth once a week. Take with a full glass of water on an empty stomach.    ? amLODipine (NORVASC) 5 MG tablet Take 5 mg by mouth daily.    ? aspirin EC 81 MG tablet Take 81 mg by mouth daily. Swallow whole.    ? buPROPion (WELLBUTRIN) 75 MG tablet Take 75 mg by mouth 2 (two) times daily.    ? Calcium Carbonate (CALCIUM 500 PO) Take by mouth.    ? Cholecalciferol (VITAMIN D3) 1.25 MG (50000 UT) CAPS Take by mouth.    ? fluticasone (FLONASE) 50 MCG/ACT nasal spray Place into both nostrils daily.    ? fluticasone-salmeterol (ADVAIR HFA) 115-21 MCG/ACT inhaler Inhale 2 puffs into the lungs 2 (two) times daily.    ? ibuprofen (ADVIL) 200 MG tablet Take 200 mg by mouth every 6 (six) hours as needed.    ? Ipratropium-Albuterol (COMBIVENT RESPIMAT) 20-100 MCG/ACT AERS respimat Inhale 1 puff into the lungs every 6 (six) hours.    ? losartan (COZAAR) 100 MG tablet Take 100 mg by mouth daily.    ? Multiple Vitamins-Minerals (PRESERVISION AREDS 2+MULTI VIT PO) Take 2 tablets by mouth.    ? Multiple Vitamins-Minerals (ZINC PO) Take by mouth.    ?  oxybutynin (DITROPAN) 5 MG tablet Take 5 mg by mouth at bedtime.    ? pravastatin (PRAVACHOL) 10 MG tablet Take 10 mg by mouth daily.    ? sertraline (ZOLOFT) 50 MG tablet Take 50 mg by mouth daily.    ? ? ?Pertinent medications related to GI and procedure were reviewed by me with the patient prior to the procedure ? ? ?Current Facility-Administered Medications:  ?  0.9 %  sodium chloride infusion, , Intravenous, Continuous, Annamaria Helling, DO, Last Rate: 20 mL/hr at 08/30/21 0848, 20 mL/hr at 08/30/21 0848 ?  ?  ? ?Allergies  ?Allergen  Reactions  ? Bupropion   ? ?Allergies were reviewed by me prior to the procedure ? ?Objective  ? ? ?Vitals:  ? 08/30/21 0840  ?BP: (!) 173/108  ?Pulse: 97  ?Resp: 20  ?Temp: (!) 97 ?F (36.1 ?C)  ?TempSrc: Temporal  ?SpO2: 99%  ?Weight: 49.9 kg  ?Height: '5\' 2"'$  (1.575 m)  ? ? ?Physical Exam ?Vitals and nursing note reviewed.  ?Constitutional:   ?   General: She is not in acute distress. ?   Appearance: Normal appearance. She is not ill-appearing, toxic-appearing or diaphoretic.  ?HENT:  ?   Head: Normocephalic and atraumatic.  ?   Nose: Nose normal.  ?   Mouth/Throat:  ?   Mouth: Mucous membranes are moist.  ?   Pharynx: Oropharynx is clear.  ?Eyes:  ?   General: No scleral icterus. ?   Extraocular Movements: Extraocular movements intact.  ?Cardiovascular:  ?   Rate and Rhythm: Normal rate and regular rhythm.  ?   Heart sounds: No murmur heard. ?  No friction rub. No gallop.  ?Pulmonary:  ?   Effort: Pulmonary effort is normal. No respiratory distress.  ?   Breath sounds: Normal breath sounds. No wheezing, rhonchi or rales.  ?Abdominal:  ?   General: Bowel sounds are normal. There is no distension.  ?   Palpations: Abdomen is soft.  ?   Tenderness: There is no abdominal tenderness. There is no guarding or rebound.  ?Musculoskeletal:  ?   Cervical back: Neck supple.  ?   Right lower leg: No edema.  ?   Left lower leg: No edema.  ?Skin: ?   General: Skin is warm and dry.  ?   Coloration: Skin is not jaundiced or pale.  ?Neurological:  ?   General: No focal deficit present.  ?   Mental Status: She is alert and oriented to person, place, and time. Mental status is at baseline.  ?Psychiatric:     ?   Mood and Affect: Mood normal.     ?   Behavior: Behavior normal.     ?   Thought Content: Thought content normal.     ?   Judgment: Judgment normal.  ? ? ? ?Assessment:  ?Taylor Chambers is a 76 y.o. female  who presents today for Colonoscopy for Screening colonoscopy. ? ?Plan:  ?Colonoscopy with possible intervention  today ? ?Colonoscopy with possible biopsy, control of bleeding, polypectomy, and interventions as necessary has been discussed with the patient/patient representative. Informed consent was obtained from the patient/patient representative after explaining the indication, nature, and risks of the procedure including but not limited to death, bleeding, perforation, missed neoplasm/lesions, cardiorespiratory compromise, and reaction to medications. Opportunity for questions was given and appropriate answers were provided. Patient/patient representative has verbalized understanding is amenable to undergoing the procedure. ? ? ?Annamaria Helling, DO  ?[facility] ? ?Portions of the record may have been created with voice recognition software. Occasional wrong-word or 'sound-a-like' substitutions may have occurred due to the inherent limitations of voice recognition software.  Read the chart carefully and recognize, using context, where substitutions may have occurred. ?

## 2021-08-30 NOTE — Interval H&P Note (Signed)
History and Physical Interval Note: Preprocedure H&P from 08/30/21 ? was reviewed and there was no interval change after seeing and examining the patient.  Written consent was obtained from the patient after discussion of risks, benefits, and alternatives. Patient has consented to proceed with Colonoscopy with possible intervention ? ? ?08/30/2021 ?8:53 AM ? ?Romanda Turrubiates  has presented today for surgery, with the diagnosis of Screening for colon cancer (Z12.11).  The various methods of treatment have been discussed with the patient and family. After consideration of risks, benefits and other options for treatment, the patient has consented to  Procedure(s): ?COLONOSCOPY (N/A) as a surgical intervention.  The patient's history has been reviewed, patient examined, no change in status, stable for surgery.  I have reviewed the patient's chart and labs.  Questions were answered to the patient's satisfaction.   ? ? ?Annamaria Helling ? ? ?

## 2021-08-30 NOTE — Anesthesia Preprocedure Evaluation (Signed)
Anesthesia Evaluation  ?Patient identified by MRN, date of birth, ID band ?Patient awake ? ? ? ?Reviewed: ?Allergy & Precautions, H&P , NPO status , Patient's Chart, lab work & pertinent test results, reviewed documented beta blocker date and time  ? ?History of Anesthesia Complications ?Negative for: history of anesthetic complications ? ?Airway ?Mallampati: II ? ?TM Distance: >3 FB ?Neck ROM: full ? ? ? Dental ? ?(+) Dental Advidsory Given, Partial Upper, Partial Lower, Teeth Intact ?  ?Pulmonary ?shortness of breath and with exertion, COPD,  COPD inhaler, neg recent URI, former smoker,  ?  ?Pulmonary exam normal ?breath sounds clear to auscultation ? ? ? ? ? ? Cardiovascular ?Exercise Tolerance: Good ?hypertension, (-) angina(-) Past MI and (-) Cardiac Stents Normal cardiovascular exam(-) dysrhythmias (-) Valvular Problems/Murmurs ?Rhythm:regular Rate:Normal ? ? ?  ?Neuro/Psych ?PSYCHIATRIC DISORDERS Anxiety Depression negative neurological ROS ?   ? GI/Hepatic ?negative GI ROS, Neg liver ROS,   ?Endo/Other  ?negative endocrine ROS ? Renal/GU ?negative Renal ROS  ?negative genitourinary ?  ?Musculoskeletal ? ? Abdominal ?  ?Peds ? Hematology ?negative hematology ROS ?(+)   ?Anesthesia Other Findings ?Past Medical History: ?No date: Anxiety ?No date: Depression ?No date: Emphysema lung ([place]) ?No date: History of cataract ?No date: Hypertension ?No date: Osteoporosis ?No date: Substance abuse ([place]) ?    Comment:  tobacco user ? ? Reproductive/Obstetrics ?negative OB ROS ? ?  ? ? ? ? ? ? ? ? ? ? ? ? ? ?  ?  ? ? ? ? ? ? ? ? ?Anesthesia Physical ?Anesthesia Plan ? ?ASA: 3 ? ?Anesthesia Plan: General  ? ?Post-op Pain Management:   ? ?Induction: Intravenous ? ?PONV Risk Score and Plan: 2 and Propofol infusion and TIVA ? ?Airway Management Planned: Natural Airway and Nasal Cannula ? ?Additional Equipment:  ? ?Intra-op Plan:  ? ?Post-operative Plan:  ? ?Informed Consent: I have reviewed  the patients History and Physical, chart, labs and discussed the procedure including the risks, benefits and alternatives for the proposed anesthesia with the patient or authorized representative who has indicated his/her understanding and acceptance.  ? ? ? ?Dental Advisory Given ? ?Plan Discussed with: Anesthesiologist, CRNA and Surgeon ? ?Anesthesia Plan Comments:   ? ? ? ? ? ? ?Anesthesia Quick Evaluation ? ?

## 2021-08-31 ENCOUNTER — Encounter: Payer: Self-pay | Admitting: Gastroenterology

## 2021-08-31 LAB — SURGICAL PATHOLOGY

## 2021-10-22 NOTE — Discharge Instructions (Signed)
Instructions after Total Hip Replacement     Taylor Chambers, Jr., M.D.     Dept. of Orthopaedics & Sports Medicine  [facility]  [address]  [place], [place]  [address]  Phone: [phone]   Fax: [phone]    DIET: . Drink plenty of non-alcoholic fluids. . Resume your normal diet. Include foods high in fiber.  ACTIVITY:  . You may use crutches or a walker with weight-bearing as tolerated, unless instructed otherwise. . You may be weaned off of the walker or crutches by your Physical Therapist.  . Do NOT reach below the level of your knees or cross your legs until allowed.    . Continue doing gentle exercises. Exercising will reduce the pain and swelling, increase motion, and prevent muscle weakness.   . Please continue to use the TED compression stockings for 6 weeks. You may remove the stockings at night, but should reapply them in the morning. . Do not drive or operate any equipment until instructed.  WOUND CARE:  . Continue to use ice packs periodically to reduce pain and swelling. . Keep the incision clean and dry. . You may bathe or shower after the staples are removed at the first office visit following surgery.  MEDICATIONS: . You may resume your regular medications. . Please take the pain medication as prescribed on the medication. . Do not take pain medication on an empty stomach. . You have been given a prescription for a blood thinner to prevent blood clots. Please take the medication as instructed. (NOTE: After completing a 2 week course of Lovenox, take one Enteric-coated aspirin once a day.) . Pain medications and iron supplements can cause constipation. Use a stool softener (Senokot or Colace) on a daily basis and a laxative (dulcolax or miralax) as needed. . Do not drive or drink alcoholic beverages when taking pain medications.  CALL THE OFFICE FOR: . Temperature above 101 degrees . Excessive bleeding or drainage on the dressing. . Excessive  swelling, coldness, or paleness of the toes. . Persistent nausea and vomiting.  FOLLOW-UP:  . You should have an appointment to return to the office in 6 weeks after surgery. . Arrangements have been made for continuation of Physical Therapy (either home therapy or outpatient therapy).     [facility] Department Directory         [email or web address]       [email or web address]          Cardiology  Appointments: [place] - [phone] [place] - [phone]  Endocrinology  Appointments: [place] - [phone] [place] - [phone]  Gastroenterology  Appointments: [place] - [phone] [place] - [phone]        General Surgery   Appointments: [place] - [phone]  Internal Medicine/Family Medicine  Appointments: [place] - [phone] [place] - [phone] [place] - [phone]  Metabolic and Weigh Loss Surgery  Appointments: [place] - [phone]        Neurology  Appointments: [place] - [phone] [place] - [phone]  Neurosurgery  Appointments: [place] - [phone]  Obstetrics & Gynecology  Appointments: [place] - [phone] [place] - [phone]        Pediatrics  Appointments: [place] - [phone] [place] - [phone]  Physiatry  Appointments: [place] -[phone]  Physical Therapy  Appointments: [place] - [phone] [place] - [phone]        Podiatry  Appointments: [place] - [phone] [place] - [phone]  Pulmonology  Appointments: [place] - [phone]  Rheumatology  Appointments: [place] - [phone]        [place] Location: [facility]  [address] [place], [place]  [address]  [place] Location: [facility] [address] [place], [place]  [address]  [place] Location: [facility] [address] [place], [place]  [address]    

## 2021-10-28 ENCOUNTER — Other Ambulatory Visit: Payer: Self-pay

## 2021-10-28 ENCOUNTER — Encounter
Admission: RE | Admit: 2021-10-28 | Discharge: 2021-10-28 | Disposition: A | Discharge status: Home / Self Care | Payer: Medicare HMO | Source: Ambulatory Visit | Attending: Orthopedic Surgery | Admitting: Orthopedic Surgery

## 2021-10-28 VITALS — BP 148/70 | HR 83 | Resp 17

## 2021-10-28 DIAGNOSIS — Z87891 Personal history of nicotine dependence: Secondary | ICD-10-CM

## 2021-10-28 DIAGNOSIS — E876 Hypokalemia: Secondary | ICD-10-CM | POA: Diagnosis not present

## 2021-10-28 DIAGNOSIS — R7989 Other specified abnormal findings of blood chemistry: Secondary | ICD-10-CM | POA: Insufficient documentation

## 2021-10-28 DIAGNOSIS — Z01818 Encounter for other preprocedural examination: Secondary | ICD-10-CM | POA: Insufficient documentation

## 2021-10-28 DIAGNOSIS — M1611 Unilateral primary osteoarthritis, right hip: Secondary | ICD-10-CM | POA: Diagnosis not present

## 2021-10-28 DIAGNOSIS — Z01812 Encounter for preprocedural laboratory examination: Secondary | ICD-10-CM

## 2021-10-28 HISTORY — DX: Pneumonia, unspecified organism: J18.9

## 2021-10-28 HISTORY — DX: Unspecified macular degeneration: H35.30

## 2021-10-28 HISTORY — DX: Headache, unspecified: R51.9

## 2021-10-28 LAB — URINALYSIS, ROUTINE W REFLEX MICROSCOPIC
Bilirubin Urine: NEGATIVE
Glucose, UA: NEGATIVE mg/dL
Hgb urine dipstick: NEGATIVE
Ketones, ur: 5 mg/dL — AB
Leukocytes,Ua: NEGATIVE
Nitrite: NEGATIVE
Protein, ur: 30 mg/dL — AB
Specific Gravity, Urine: 1.026 (ref 1.005–1.030)
pH: 5 (ref 5.0–8.0)

## 2021-10-28 LAB — CBC
HCT: 43.1 % (ref 36.0–46.0)
Hemoglobin: 14.2 g/dL (ref 12.0–15.0)
MCH: 32 pg (ref 26.0–34.0)
MCHC: 32.9 g/dL (ref 30.0–36.0)
MCV: 97.1 fL (ref 80.0–100.0)
Platelets: 329 10*3/uL (ref 150–400)
RBC: 4.44 MIL/uL (ref 3.87–5.11)
RDW: 12.4 % (ref 11.5–15.5)
WBC: 10.4 10*3/uL (ref 4.0–10.5)
nRBC: 0 % (ref 0.0–0.2)

## 2021-10-28 LAB — C-REACTIVE PROTEIN: CRP: 1.1 mg/dL — ABNORMAL HIGH (ref <1.0)

## 2021-10-28 LAB — COMPREHENSIVE METABOLIC PANEL
ALT: 15 U/L (ref 0–44)
AST: 20 U/L (ref 15–41)
Albumin: 4.1 g/dL (ref 3.5–5.0)
Alkaline Phosphatase: 78 U/L (ref 38–126)
Anion gap: 7 (ref 5–15)
BUN: 13 mg/dL (ref 8–23)
CO2: 29 mmol/L (ref 22–32)
Calcium: 8.9 mg/dL (ref 8.9–10.3)
Chloride: 106 mmol/L (ref 98–111)
Creatinine, Ser: 0.65 mg/dL (ref 0.44–1.00)
GFR, Estimated: >60 mL/min (ref >60)
Glucose, Bld: 76 mg/dL (ref 70–99)
Potassium: 3 mmol/L — ABNORMAL LOW (ref 3.5–5.1)
Sodium: 142 mmol/L (ref 135–145)
Total Bilirubin: 0.4 mg/dL (ref 0.3–1.2)
Total Protein: 7 g/dL (ref 6.5–8.1)

## 2021-10-28 LAB — SURGICAL PCR SCREEN
MRSA, PCR: NEGATIVE
Staphylococcus aureus: NEGATIVE

## 2021-10-28 LAB — SEDIMENTATION RATE: Sed Rate: 11 mm/hr (ref 0–30)

## 2021-10-28 NOTE — Patient Instructions (Addendum)
Your procedure is scheduled on: 11/10/21 - Wednesday Report to the Registration Desk on the 1st floor of the [facility]. To find out your arrival time, please call [phone] between 1PM - 3PM on: 11/09/21 - Tuesday If your arrival time is 6:00 am, do not arrive prior to that time as the [place] entrance doors do not open until 6:00 am.  REMEMBER: Instructions that are not followed completely may result in serious medical risk, up to and including death; or upon the discretion of your surgeon and anesthesiologist your surgery may need to be rescheduled.  Do not eat food after midnight the night before surgery.  No gum chewing, lozengers or hard candies.  You may however, drink CLEAR liquids up to 2 hours before you are scheduled to arrive for your surgery. Do not drink anything within 2 hours of your scheduled arrival time.  Clear liquids include: - water  - apple juice without pulp - gatorade (not RED colors) - black coffee or tea (Do NOT add milk or creamers to the coffee or tea) Do NOT drink anything that is not on this list.  In addition, your doctor has ordered for you to drink the provided  Ensure Pre-Surgery Clear Carbohydrate Drink  Drinking this carbohydrate drink up to two hours before surgery helps to reduce insulin resistance and improve patient outcomes. Please complete drinking 2 hours prior to scheduled arrival time.  TAKE ONLY THESE MEDICATIONS THE MORNING OF SURGERY WITH A SIP OF WATER:  - ipratropium-albuterol (DUONEB) 0.5-2.5 (3) MG/3ML SOLN - Ipratropium-Albuterol (COMBIVENT RESPIMAT) 20-100 MCG/ACT AERS respimat bring to the hospital with you. - budesonide (PULMICORT) 0.25 MG/2ML nebulizer solution   One week prior to surgery: Stop Anti-inflammatories (NSAIDS) such as Advil, Aleve, Ibuprofen, Motrin, Naproxen, Naprosyn and Aspirin based products such as Excedrin, Goodys Powder, BC Powder.  Stop ANY OVER THE COUNTER supplements until after  surgery.Calcium Carbonate (CALCIUM 500 PO, Cholecalciferol (VITAMIN D3) 1.25 MG (50000 UT) CAPS,  Multiple Vitamins-Minerals (PRESERVISION AREDS 2+MULTI VIT PO You may take Tylenol if needed for pain up until the day of surgery.  No Alcohol for 24 hours before or after surgery.  No Smoking including e-cigarettes for 24 hours prior to surgery.  No chewable tobacco products for at least 6 hours prior to surgery.  No nicotine patches on the day of surgery.  Do not use any "recreational" drugs for at least a week prior to your surgery.  Please be advised that the combination of cocaine and anesthesia may have negative outcomes, up to and including death. If you test positive for cocaine, your surgery will be cancelled.  On the morning of surgery brush your teeth with toothpaste and water, you may rinse your mouth with mouthwash if you wish. Do not swallow any toothpaste or mouthwash.  Use CHG Soap or wipes as directed on instruction sheet.  Do not wear jewelry, make-up, hairpins, clips or nail polish.  Do not wear lotions, powders, or perfumes.   Do not shave body from the neck down 48 hours prior to surgery just in case you cut yourself which could leave a site for infection.  Also, freshly shaved skin may become irritated if using the CHG soap.  Contact lenses, hearing aids and dentures may not be worn into surgery.  Do not bring valuables to the hospital. [facility] is not responsible for any missing/lost belongings or valuables.   Notify your doctor if there is any change in your medical condition (cold, fever, infection).  Wear comfortable clothing (specific to your surgery type) to the hospital.  After surgery, you can help prevent lung complications by doing breathing exercises.  Take deep breaths and cough every 1-2 hours. Your doctor may order a device called an Incentive Spirometer to help you take deep breaths. When coughing or sneezing, hold a pillow firmly against your  incision with both hands. This is called "splinting." Doing this helps protect your incision. It also decreases belly discomfort.  If you are being admitted to the hospital overnight, leave your suitcase in the car. After surgery it may be brought to your room.  If you are being discharged the day of surgery, you will not be allowed to drive home. You will need a responsible adult (18 years or older) to drive you home and stay with you that night.   If you are taking public transportation, you will need to have a responsible adult (18 years or older) with you. Please confirm with your physician that it is acceptable to use public transportation.   Please call the [facility] at [phone] if you have any questions about these instructions.  Surgery Visitation Policy:  Patients undergoing a surgery or procedure may have two family members or support persons with them as long as the person is not COVID-19 positive or experiencing its symptoms.   Inpatient Visitation:    Visiting hours are 7 a.m. to 8 p.m. Up to four visitors are allowed at one time in a patient room, including children. The visitors may rotate out with other people during the day. One designated support person (adult) may remain overnight.

## 2021-10-28 NOTE — Progress Notes (Signed)
  [facility] Perioperative Services: Pre-Admission/Anesthesia Testing  Abnormal Lab Notification   Date: 10/28/21  Name: Jaeden Westbay MRN:   [id number]  Re: Abnormal labs noted during PAT appointment   Notified:  Provider Name Provider Role Notification Mode  Skip Estimable, MD Orthopedics (Surgeon) Routed and/or faxed via [place] and Notes:  ABNORMAL LAB VALUE(S): Lab Results  Component Value Date   K 3.0 (L) 10/28/2021   Elvera Almario is scheduled for an elective TOTAL HIP ARTHROPLASTY (Right: Hip) on 11/10/2021. In review of her medication reconciliation, it is noted that the patient is NOT taking any type of prescribed diuretic medications. Please note, in efforts to promote a safe and effective anesthetic course, per current guidelines/standards set by the [facility] anesthesia team, the minimal acceptable K+ level for the patient to proceed with general anesthesia is 3.0 mmol/L. With that being said, at her current level, her elective procedure would need to be postponed until K+ is better optimized. Abnormal result is being forwarded to primary attending surgeon for review and optimization in efforts to avoid case cancellation. Order placed to have K+ rechecked on the day of her procedure to ensure correction of the noted derangement.    Honor Loh, MSN, APRN, FNP-C, CEN [facility]  Peri-operative Services Nurse Practitioner Phone: [phone] Fax: [phone] 10/28/21 3:17 PM

## 2021-11-04 ENCOUNTER — Encounter: Payer: Self-pay | Admitting: Orthopedic Surgery

## 2021-11-07 NOTE — H&P (Signed)
ORTHOPAEDIC HISTORY & PHYSICAL Gwenlyn Fudge, [place] - 10/26/2021 8:15 AM EDT Formatting of this note is different from the original. [facility] Chief Complaint:   Chief Complaint  Patient presents with   Hip Pain  H & P RIGHT HIP   History of Present Illness:   Taylor Chambers is a 76 y.o. female that presents to clinic today for her preoperative history and evaluation. Patient presents with her husband. The patient is scheduled to undergo a right total hip arthroplasty on 11/10/21 by Dr. Marry Guan. Her pain began around 6 months ago. The pain is located in the right hip and groin. She describes her pain as worse with weightbearing and extremes of rotation. She denies associated numbness or tingling. Of note, patient does have a burning pain in the right buttocks that radiates to the groin as well as occasional radiation of pain down the lateral aspect of the thigh to the ankle.  The patient's symptoms have progressed to the point that they decrease her quality of life. The patient has previously undergone conservative treatment including NSAIDS and activity modification without adequate control of her symptoms.  Patient denies history of lumbar surgery, history of DVT. She is followed by Dr Orest Dikes office in cardiology.   No significant drug allergies. Her husband will be available to help after surgery.  Past Medical, Surgical, Family, Social History, Allergies, Medications:   Past Medical History:  Past Medical History:  Diagnosis Date   Allergic rhinitis due to allergen   Allergy  take Allegra Allergy and Flonase   Anxiety buspor  mild - no medication   Depression zolof   Emphysema of lung (CMS-HCC)   History of cataract  surgery in 2008   Hypertension   Osteoporosis 02/13/2019   Substance abuse (CMS-HCC)  tobacco user   Past Surgical History:  Past Surgical History:  Procedure Laterality Date   EXTRACTION TEETH Bilateral  06/12/2019   colonscopy 08/30/2021  Tubular adenoma/Hyperplastic polyp/Repeat 35yr/SMR   CATARACT EXTRACTION   DILATION AND CURETTAGE, DIAGNOSTIC / THERAPEUTIC   WRIST GANGLION EXCISION   Current Medications:  Current Outpatient Medications  Medication Sig Dispense Refill   amLODIPine (NORVASC) 5 MG tablet TAKE ONE AND ONE-HALF TABLET BY MOUTH ONCE DAILY 135 tablet 1   aspirin 81 MG EC tablet Take 81 mg by mouth once daily.   budesonide (PULMICORT) 0.25 mg/2 mL nebulizer solution Take 2 mLs (0.25 mg total) by nebulization 2 (two) times daily for 90 days 360 mL 0   calcium 500 mg Tab Take 500 mg by mouth once daily.   cholecalciferol (VITAMIN D3) 1000 unit tablet Take 1,000 Units by mouth once daily   fluticasone propionate (FLONASE) 50 mcg/actuation nasal spray Place 2 sprays into both nostrils 2 (two) times daily   fluticasone-umeclidinium-vilanterol (TRELEGY ELLIPTA) 100-62.5-25 mcg inhaler Inhale 1 inhalation into the lungs once daily 1 each 10   ipratropium-albuteroL (DUO-NEB) nebulizer solution Take 3 mLs by nebulization 2 (two) times daily for 90 days 540 mL 0   losartan (COZAAR) 100 MG tablet TAKE 1 TABLET BY MOUTH ONCE DAILY 90 tablet 3   naproxen sodium (ALEVE) 220 MG tablet Take 220 mg by mouth 2 (two) times daily as needed for Pain   pravastatin (PRAVACHOL) 10 MG tablet Take 1 tablet (10 mg total) by mouth at bedtime 90 tablet 2   vit C/E/Zn/coppr/lutein/zeaxan (PRESERVISION AREDS-2 ORAL) Take 2 capsules by mouth once daily   ipratropium-albuteroL (COMBIVENT RESPIMAT) 20-100 mcg/actuation  inhaler Inhale 2 inhalations into the lungs 4 (four) times daily as needed for Wheezing 4 g 12   No current facility-administered medications for this visit.   Allergies:  Allergies  Allergen Reactions   Bupropion Other (See Comments)  "feels crazy"   Social History:  Social History   Socioeconomic History   Marital status: Married  Spouse name: Laporche Martelle   Number of  children: 2   Years of education: 12+   Highest education level: Some college, no degree  Occupational History   Occupation: Retired - Buyer, retail -[employer]  Tobacco Use   Smoking status: Former  Packs/day: 0.75  Years: 50.00  Pack years: 37.50  Types: Cigarettes  Start date: 06/06/1968  Quit date: 06/06/2021  Years since quitting: 0.3   Smokeless tobacco: Never   Tobacco comments:  Need help to quit  Vaping Use   Vaping Use: Never used  Substance and Sexual Activity   Alcohol use: Never   Drug use: Never   Sexual activity: Not Currently  Partners: Male  Birth control/protection: Pill, Post-menopausal   Family History:  Family History  Problem Relation Age of Onset   Breast cancer Mother   High blood pressure (Hypertension) Mother   Diabetes Mother   Diabetes type II Mother   Stroke Mother   Cancer Mother   Aneurysm Father   High blood pressure (Hypertension) Father   Stroke Father   Prostate cancer Brother   High blood pressure (Hypertension) Brother   Review of Systems:   A 10+ ROS was performed, reviewed, and the pertinent orthopaedic findings are documented in the HPI.   Physical Examination:   BP (!) 140/90 (BP Location: Left upper arm, Patient Position: Sitting, BP Cuff Size: Adult)  Ht 157.5 cm ('5\' 2"'$ )  Wt 49.4 kg (109 lb)  BMI 19.94 kg/m   Patient is a well-developed, well-nourished female in no acute distress. Patient has normal mood and affect. Patient is alert and oriented to person, place, and time.   HEENT: Atraumatic, normocephalic. Pupils equal and reactive to light. Extraocular motion intact. Noninjected sclera.  Cardiovascular: Regular rate and rhythm, with no murmurs, rubs, or gallops. Distal pulses palpable.  Respiratory: Lungs clear to auscultation bilaterally.   Right Hip: Pelvic tilt: Negative Limb lengths: Equal with the patient standing Soft tissue swelling: Negative Erythema: Negative Crepitance:  Negative Tenderness: Greater trochanter is nontender to palpation. Moderate pain is elicited by axial compression or extremes of rotation. Atrophy: No atrophy. Fair to good hip flexor and abductor strength. Range of Motion:   IR/ER: 20/0/30  Patient has approximately 110 degrees of flexion.  Patient able to dorsiflex and plantarflex the right ankle. Able to flex and extend the toes.  Sensation is intact over the saphenous, lateral cutaneous, superficial fibular, and deep fibular nerve distributions.  Tests Performed/Reviewed:  X-rays  Anteroposterior view of the pelvis as well as anteroposterior and lateral views of the right hip were obtained. Images are mild loss of femoral acetabular joint space. Mild osteophyte formation of the inferior femoral head is noted.  I personally ordered and interpreted today's x-rays.  MRI of the right hip:   MR OF THE RIGHT HIP WITHOUT CONTRAST   TECHNIQUE:  Multiplanar, multisequence MR imaging was performed. No intravenous  contrast was administered.   COMPARISON:  None.   FINDINGS:  Bones: Marked bone marrow edema throughout the right femoral head,  neck, and intertrochanteric region. Developing trabecular fracture  within the basicervical and intertrochanteric aspects of  the right  femur (series 3, image 21). Extensive subchondral fracture of the  femoral head superiorly with slight flattening of the femoral head  contour. No typical findings of femoral head avascular necrosis.  Bone marrow edema within the posterior and medial aspects of the  right acetabulum where there is a suspected developing fracture  (series 3, image 15).   Left hip joint intact without fracture, malalignment, or significant  arthropathy. No marrow edema associated with the proximal left  femur. Pelvic bony ring intact without diastasis. SI joints and  pubic symphysis within normal limits. Degenerative disc disease and  facet arthropathy of the included lower  lumbar spine.   Articular cartilage and labrum   Articular cartilage: High-grade cartilage loss along the superior  and anterosuperior aspects of the right hip joint.   Labrum: Diffuse labral degeneration. Multiple small cysts along the  acetabular rim may reflect paralabral cysts or ganglia.   Joint or bursal effusion   Joint effusion:  Small right hip joint effusion.   Bursae: None.   Muscles and tendons   Muscles and tendons: The gluteal, hamstring, iliopsoas, rectus  femoris, and adductor tendons appear intact without tear or  significant tendinosis. Intramuscular edema within the right gluteus  minimus muscles and mildly within the right adductor compartment.   Other findings   Miscellaneous: No inguinal lymphadenopathy. No acute findings within  the pelvis.   IMPRESSION:  1. Extensive bone marrow edema throughout the right femoral head,  neck, and intertrochanteric regions with developing trabecular  fractures involving the basicervical and intertrochanteric aspects  of the right femur.  2. Subchondral fracture of the right femoral head superiorly with  slight flattening of the femoral head contour.  3. Bone marrow edema within the posterior and medial aspects of the  right acetabulum where there is a suspected developing fracture.  4. Small right hip joint effusion.  5. Intramuscular edema within the right gluteus minimus and right  adductor muscles, which may represent muscle strain.   These results will be called to the ordering clinician or  representative by the Radiologist Assistant, and communication  documented in the PACS or [employer].   Electronically Signed    By: Davina Poke D.O.    On: 08/31/2021 10:09  Impression:   ICD-10-CM  1. Primary osteoarthritis of right hip M16.11   Plan:   The patient has severe degenerative changes of the right hip. It was explained to the patient that the condition is progressive in nature. Having  failed conservative treatment, the patient has elected to proceed with a total joint arthroplasty. The patient will undergo a total joint arthroplasty with Dr. Marry Guan. The risks of surgery, including blood clot and infection, were discussed with the patient. Measures to reduce these risks, including the use of anticoagulation, perioperative antibiotics, and early ambulation were discussed. The importance of postoperative physical therapy was discussed with the patient. The patient elects to proceed with surgery. The patient is instructed to stop all blood thinners prior to surgery. The patient is instructed to call the hospital the day before surgery to learn of the proper arrival time.   Contact our office with any questions or concerns. Follow up as indicated, or sooner should any new problems arise, if conditions worsen, or if they are otherwise concerned.   Gwenlyn Fudge, PA-C [facility] [place], [place] [address] Phone: [phone]  This note was generated in part with voice recognition software and I  apologize for any typographical errors that were not detected and corrected.  Electronically signed by Gwenlyn Fudge, PA at 10/26/2021 12:01 PM EDT

## 2021-11-09 MED ORDER — CHLORHEXIDINE GLUCONATE 0.12 % MT SOLN: 15.0000 mL | Freq: Once | OROMUCOSAL | Status: DC

## 2021-11-09 MED ORDER — LACTATED RINGERS IV SOLN
INTRAVENOUS | Status: DC
Start: 2021-11-09 — End: 2021-11-10

## 2021-11-09 MED ORDER — CHLORHEXIDINE GLUCONATE 4 % EX LIQD: 60.0000 mL | Freq: Once | CUTANEOUS | Status: DC

## 2021-11-09 MED ORDER — CEFAZOLIN SODIUM-DEXTROSE 2-4 GM/100ML-% IV SOLN: 2.0000 g | INTRAVENOUS | Status: DC

## 2021-11-09 MED ORDER — ORAL CARE MOUTH RINSE: 15.0000 mL | Freq: Once | OROMUCOSAL | Status: DC

## 2021-11-09 MED ORDER — CELECOXIB 200 MG PO CAPS: 400.0000 mg | ORAL_CAPSULE | Freq: Once | ORAL | Status: DC

## 2021-11-09 MED ORDER — GABAPENTIN 300 MG PO CAPS: 300.0000 mg | ORAL_CAPSULE | Freq: Once | ORAL | Status: DC

## 2021-11-09 MED ORDER — TRANEXAMIC ACID-NACL 1000-0.7 MG/100ML-% IV SOLN: 1000.0000 mg | INTRAVENOUS | Status: DC

## 2021-11-09 MED ORDER — FAMOTIDINE 20 MG PO TABS: 20.0000 mg | ORAL_TABLET | Freq: Once | ORAL | Status: AC

## 2021-11-09 MED ORDER — DEXAMETHASONE SODIUM PHOSPHATE 10 MG/ML IJ SOLN: 8.0000 mg | Freq: Once | INTRAMUSCULAR | Status: DC

## 2021-11-10 ENCOUNTER — Encounter: Payer: Self-pay | Admitting: Orthopedic Surgery

## 2021-11-10 ENCOUNTER — Observation Stay
Admission: RE | Admit: 2021-11-10 | Discharge: 2021-11-11 | Disposition: A | Discharge status: Home Health | Payer: Medicare HMO | Source: Ambulatory Visit | Attending: Orthopedic Surgery | Admitting: Orthopedic Surgery

## 2021-11-10 ENCOUNTER — Observation Stay: Payer: Medicare HMO

## 2021-11-10 ENCOUNTER — Other Ambulatory Visit: Payer: Self-pay

## 2021-11-10 ENCOUNTER — Ambulatory Visit: Payer: Medicare HMO | Admitting: Urgent Care

## 2021-11-10 ENCOUNTER — Encounter
Admission: RE | Disposition: A | Discharge status: Home Health | Payer: Self-pay | Source: Ambulatory Visit | Attending: Orthopedic Surgery

## 2021-11-10 DIAGNOSIS — Z79899 Other long term (current) drug therapy: Secondary | ICD-10-CM | POA: Insufficient documentation

## 2021-11-10 DIAGNOSIS — M1611 Unilateral primary osteoarthritis, right hip: Secondary | ICD-10-CM | POA: Diagnosis not present

## 2021-11-10 DIAGNOSIS — J449 Chronic obstructive pulmonary disease, unspecified: Secondary | ICD-10-CM | POA: Insufficient documentation

## 2021-11-10 DIAGNOSIS — Z87891 Personal history of nicotine dependence: Secondary | ICD-10-CM | POA: Insufficient documentation

## 2021-11-10 DIAGNOSIS — Z01812 Encounter for preprocedural laboratory examination: Secondary | ICD-10-CM

## 2021-11-10 DIAGNOSIS — Z7982 Long term (current) use of aspirin: Secondary | ICD-10-CM | POA: Diagnosis not present

## 2021-11-10 DIAGNOSIS — I10 Essential (primary) hypertension: Secondary | ICD-10-CM | POA: Insufficient documentation

## 2021-11-10 DIAGNOSIS — E876 Hypokalemia: Secondary | ICD-10-CM

## 2021-11-10 DIAGNOSIS — Z96641 Presence of right artificial hip joint: Secondary | ICD-10-CM

## 2021-11-10 HISTORY — DX: Other forms of dyspnea: R06.09

## 2021-11-10 HISTORY — DX: Chronic obstructive pulmonary disease, unspecified: J44.9

## 2021-11-10 HISTORY — DX: Atherosclerosis of aorta: I70.0

## 2021-11-10 HISTORY — DX: Tobacco use: Z72.0

## 2021-11-10 HISTORY — DX: Atherosclerotic heart disease of native coronary artery without angina pectoris: I25.10

## 2021-11-10 HISTORY — DX: Bipolar disorder, unspecified: F31.9

## 2021-11-10 HISTORY — DX: Allergy, unspecified, initial encounter: T78.40XA

## 2021-11-10 HISTORY — DX: Hyperlipidemia, unspecified: E78.5

## 2021-11-10 HISTORY — DX: Migraine, unspecified, not intractable, without status migrainosus: G43.909

## 2021-11-10 HISTORY — PX: TOTAL HIP ARTHROPLASTY: SHX124

## 2021-11-10 LAB — POCT I-STAT, CHEM 8
BUN: 10 mg/dL (ref 8–23)
Calcium, Ion: 1.19 mmol/L (ref 1.15–1.40)
Chloride: 106 mmol/L (ref 98–111)
Creatinine, Ser: 0.5 mg/dL (ref 0.44–1.00)
Glucose, Bld: 103 mg/dL — ABNORMAL HIGH (ref 70–99)
HCT: 44 % (ref 36.0–46.0)
Hemoglobin: 15 g/dL (ref 12.0–15.0)
Potassium: 4.8 mmol/L (ref 3.5–5.1)
Sodium: 140 mmol/L (ref 135–145)
TCO2: 27 mmol/L (ref 22–32)

## 2021-11-10 SURGERY — ARTHROPLASTY, HIP, TOTAL,POSTERIOR APPROACH
Anesthesia: Spinal | Site: Hip | Laterality: Right

## 2021-11-10 MED ORDER — BISACODYL 10 MG RE SUPP: 10.0000 mg | Freq: Every day | RECTAL | Status: DC | PRN

## 2021-11-10 MED ORDER — PANTOPRAZOLE SODIUM 40 MG PO TBEC
40.0000 mg | DELAYED_RELEASE_TABLET | Freq: Two times a day (BID) | ORAL | Status: DC
  Administered 2021-11-10 – 2021-11-11 (×3): 40 mg via ORAL
  Filled 2021-11-10 (×3): qty 1

## 2021-11-10 MED ORDER — SODIUM CHLORIDE 0.9 % IV SOLN: INTRAVENOUS | Status: DC

## 2021-11-10 MED ORDER — CHLORHEXIDINE GLUCONATE 0.12 % MT SOLN
OROMUCOSAL | Status: AC
  Administered 2021-11-10: 15 mL via OROMUCOSAL
  Filled 2021-11-10: qty 15

## 2021-11-10 MED ORDER — IPRATROPIUM-ALBUTEROL 0.5-2.5 (3) MG/3ML IN SOLN: 3.0000 mL | Freq: Two times a day (BID) | RESPIRATORY_TRACT | Status: DC

## 2021-11-10 MED ORDER — ONDANSETRON HCL 4 MG PO TABS: 4.0000 mg | ORAL_TABLET | Freq: Four times a day (QID) | ORAL | Status: DC | PRN

## 2021-11-10 MED ORDER — TRANEXAMIC ACID-NACL 1000-0.7 MG/100ML-% IV SOLN
INTRAVENOUS | Status: AC
  Administered 2021-11-10: 1000 mg via INTRAVENOUS
  Filled 2021-11-10: qty 100

## 2021-11-10 MED ORDER — LOSARTAN POTASSIUM 50 MG PO TABS
100.0000 mg | ORAL_TABLET | Freq: Every day | ORAL | Status: DC
  Administered 2021-11-10: 100 mg via ORAL
  Filled 2021-11-10 (×2): qty 2

## 2021-11-10 MED ORDER — BUPIVACAINE HCL (PF) 0.5 % IJ SOLN
INTRAMUSCULAR | Status: DC | PRN
  Administered 2021-11-10: 3 mL

## 2021-11-10 MED ORDER — ACETAMINOPHEN 10 MG/ML IV SOLN
INTRAVENOUS | Status: DC | PRN
  Administered 2021-11-10: 1000 mg via INTRAVENOUS

## 2021-11-10 MED ORDER — HYDROMORPHONE HCL 1 MG/ML IJ SOLN: 0.5000 mg | INTRAMUSCULAR | Status: DC | PRN

## 2021-11-10 MED ORDER — OXYCODONE HCL 5 MG PO TABS: 10.0000 mg | ORAL_TABLET | ORAL | Status: DC | PRN

## 2021-11-10 MED ORDER — PHENOL 1.4 % MT LIQD
1.0000 | OROMUCOSAL | Status: DC | PRN
Start: 2021-11-10 — End: 2021-11-11

## 2021-11-10 MED ORDER — PROPOFOL 1000 MG/100ML IV EMUL
INTRAVENOUS | Status: AC
  Filled 2021-11-10: qty 100

## 2021-11-10 MED ORDER — OCUVITE-LUTEIN PO CAPS
2.0000 | ORAL_CAPSULE | Freq: Every day | ORAL | Status: DC
  Administered 2021-11-10 – 2021-11-11 (×2): 2 via ORAL
  Filled 2021-11-10 (×2): qty 2

## 2021-11-10 MED ORDER — MAGNESIUM HYDROXIDE 400 MG/5ML PO SUSP
30.0000 mL | Freq: Every day | ORAL | Status: DC
  Administered 2021-11-10 – 2021-11-11 (×2): 30 mL via ORAL
  Filled 2021-11-10 (×2): qty 30

## 2021-11-10 MED ORDER — AMLODIPINE BESYLATE 5 MG PO TABS
5.0000 mg | ORAL_TABLET | Freq: Every day | ORAL | Status: DC
  Administered 2021-11-10 – 2021-11-11 (×2): 5 mg via ORAL
  Filled 2021-11-10 (×2): qty 1

## 2021-11-10 MED ORDER — BUDESONIDE 0.25 MG/2ML IN SUSP
0.2500 mg | Freq: Two times a day (BID) | RESPIRATORY_TRACT | Status: DC
  Administered 2021-11-10 – 2021-11-11 (×3): 0.25 mg via RESPIRATORY_TRACT
  Filled 2021-11-10 (×3): qty 2

## 2021-11-10 MED ORDER — PRAVASTATIN SODIUM 20 MG PO TABS
10.0000 mg | ORAL_TABLET | Freq: Every day | ORAL | Status: DC
  Administered 2021-11-10: 10 mg via ORAL
  Filled 2021-11-10 (×2): qty 1

## 2021-11-10 MED ORDER — METOCLOPRAMIDE HCL 10 MG PO TABS
10.0000 mg | ORAL_TABLET | Freq: Three times a day (TID) | ORAL | Status: DC
  Administered 2021-11-10 – 2021-11-11 (×4): 10 mg via ORAL
  Filled 2021-11-10 (×9): qty 1

## 2021-11-10 MED ORDER — IPRATROPIUM-ALBUTEROL 20-100 MCG/ACT IN AERS
1.0000 | INHALATION_SPRAY | Freq: Four times a day (QID) | RESPIRATORY_TRACT | Status: DC
  Filled 2021-11-10: qty 4

## 2021-11-10 MED ORDER — CEFAZOLIN SODIUM-DEXTROSE 2-3 GM-%(50ML) IV SOLR
INTRAVENOUS | Status: DC | PRN
  Administered 2021-11-10: 2 g via INTRAVENOUS

## 2021-11-10 MED ORDER — FERROUS SULFATE 325 (65 FE) MG PO TABS
325.0000 mg | ORAL_TABLET | Freq: Two times a day (BID) | ORAL | Status: DC
  Administered 2021-11-10 – 2021-11-11 (×2): 325 mg via ORAL
  Filled 2021-11-10 (×2): qty 1

## 2021-11-10 MED ORDER — ACETAMINOPHEN 10 MG/ML IV SOLN
INTRAVENOUS | Status: AC
Start: 2021-11-10 — End: ?
  Filled 2021-11-10: qty 100

## 2021-11-10 MED ORDER — ALUM & MAG HYDROXIDE-SIMETH 200-200-20 MG/5ML PO SUSP: 30.0000 mL | ORAL | Status: DC | PRN

## 2021-11-10 MED ORDER — ACETAMINOPHEN 10 MG/ML IV SOLN: 1000.0000 mg | Freq: Once | INTRAVENOUS | Status: DC | PRN

## 2021-11-10 MED ORDER — PHENYLEPHRINE HCL (PRESSORS) 10 MG/ML IV SOLN
INTRAVENOUS | Status: AC
  Filled 2021-11-10: qty 1

## 2021-11-10 MED ORDER — OXYCODONE HCL 5 MG PO TABS
5.0000 mg | ORAL_TABLET | ORAL | Status: DC | PRN
  Administered 2021-11-10 – 2021-11-11 (×3): 5 mg via ORAL
  Filled 2021-11-10 (×3): qty 1

## 2021-11-10 MED ORDER — TRANEXAMIC ACID-NACL 1000-0.7 MG/100ML-% IV SOLN
INTRAVENOUS | Status: DC | PRN
  Administered 2021-11-10: 1000 mg via INTRAVENOUS

## 2021-11-10 MED ORDER — SODIUM CHLORIDE 0.9 % IR SOLN
Status: DC | PRN
  Administered 2021-11-10: 3000 mL

## 2021-11-10 MED ORDER — CEFAZOLIN SODIUM-DEXTROSE 2-4 GM/100ML-% IV SOLN
INTRAVENOUS | Status: AC
  Filled 2021-11-10: qty 100

## 2021-11-10 MED ORDER — LACTATED RINGERS IV SOLN: INTRAVENOUS | Status: DC | PRN

## 2021-11-10 MED ORDER — OXYCODONE HCL 5 MG PO TABS: 5.0000 mg | ORAL_TABLET | Freq: Once | ORAL | Status: DC | PRN

## 2021-11-10 MED ORDER — SURGIPHOR WOUND IRRIGATION SYSTEM - OPTIME: TOPICAL | Status: DC | PRN

## 2021-11-10 MED ORDER — FAMOTIDINE 20 MG PO TABS
ORAL_TABLET | ORAL | Status: AC
  Administered 2021-11-10: 20 mg via ORAL
  Filled 2021-11-10: qty 1

## 2021-11-10 MED ORDER — IPRATROPIUM-ALBUTEROL 0.5-2.5 (3) MG/3ML IN SOLN
3.0000 mL | Freq: Four times a day (QID) | RESPIRATORY_TRACT | Status: DC
  Administered 2021-11-10 – 2021-11-11 (×4): 3 mL via RESPIRATORY_TRACT
  Filled 2021-11-10 (×4): qty 3

## 2021-11-10 MED ORDER — ACETAMINOPHEN 325 MG PO TABS: 325.0000 mg | ORAL_TABLET | Freq: Four times a day (QID) | ORAL | Status: DC | PRN

## 2021-11-10 MED ORDER — DEXAMETHASONE SODIUM PHOSPHATE 10 MG/ML IJ SOLN
INTRAMUSCULAR | Status: AC
  Filled 2021-11-10: qty 1

## 2021-11-10 MED ORDER — TRAMADOL HCL 50 MG PO TABS
50.0000 mg | ORAL_TABLET | ORAL | Status: DC | PRN
  Administered 2021-11-10: 50 mg via ORAL
  Filled 2021-11-10: qty 1

## 2021-11-10 MED ORDER — CELECOXIB 200 MG PO CAPS
200.0000 mg | ORAL_CAPSULE | Freq: Two times a day (BID) | ORAL | Status: DC
  Administered 2021-11-10 – 2021-11-11 (×3): 200 mg via ORAL
  Filled 2021-11-10 (×3): qty 1

## 2021-11-10 MED ORDER — ONDANSETRON HCL 4 MG/2ML IJ SOLN: 4.0000 mg | Freq: Four times a day (QID) | INTRAMUSCULAR | Status: DC | PRN

## 2021-11-10 MED ORDER — MIDAZOLAM HCL 2 MG/2ML IJ SOLN
INTRAMUSCULAR | Status: AC
  Filled 2021-11-10: qty 2

## 2021-11-10 MED ORDER — FENTANYL CITRATE (PF) 100 MCG/2ML IJ SOLN: 25.0000 ug | INTRAMUSCULAR | Status: DC | PRN

## 2021-11-10 MED ORDER — ACETAMINOPHEN 10 MG/ML IV SOLN
1000.0000 mg | Freq: Four times a day (QID) | INTRAVENOUS | Status: AC
  Administered 2021-11-10 – 2021-11-11 (×4): 1000 mg via INTRAVENOUS
  Filled 2021-11-10 (×4): qty 100

## 2021-11-10 MED ORDER — MIDAZOLAM HCL 5 MG/5ML IJ SOLN
INTRAMUSCULAR | Status: DC | PRN
  Administered 2021-11-10: 2 mg via INTRAVENOUS

## 2021-11-10 MED ORDER — SENNOSIDES-DOCUSATE SODIUM 8.6-50 MG PO TABS
1.0000 | ORAL_TABLET | Freq: Two times a day (BID) | ORAL | Status: DC
  Administered 2021-11-10 – 2021-11-11 (×3): 1 via ORAL
  Filled 2021-11-10 (×3): qty 1

## 2021-11-10 MED ORDER — CEFAZOLIN SODIUM-DEXTROSE 2-4 GM/100ML-% IV SOLN
2.0000 g | Freq: Four times a day (QID) | INTRAVENOUS | Status: AC
  Administered 2021-11-10 (×2): 2 g via INTRAVENOUS
  Filled 2021-11-10 (×2): qty 100

## 2021-11-10 MED ORDER — FLEET ENEMA 7-19 GM/118ML RE ENEM: 1.0000 | ENEMA | Freq: Once | RECTAL | Status: DC | PRN

## 2021-11-10 MED ORDER — DIPHENHYDRAMINE HCL 12.5 MG/5ML PO ELIX: 12.5000 mg | ORAL_SOLUTION | ORAL | Status: DC | PRN

## 2021-11-10 MED ORDER — GABAPENTIN 300 MG PO CAPS
ORAL_CAPSULE | ORAL | Status: AC
  Administered 2021-11-10: 300 mg via ORAL
  Filled 2021-11-10: qty 1

## 2021-11-10 MED ORDER — 0.9 % SODIUM CHLORIDE (POUR BTL) OPTIME
TOPICAL | Status: DC | PRN
  Administered 2021-11-10: 1000 mL

## 2021-11-10 MED ORDER — GLYCOPYRROLATE 0.2 MG/ML IJ SOLN
INTRAMUSCULAR | Status: DC | PRN
  Administered 2021-11-10: .2 mg via INTRAVENOUS

## 2021-11-10 MED ORDER — VITAMIN D 25 MCG (1000 UNIT) PO TABS
5000.0000 [IU] | ORAL_TABLET | Freq: Every day | ORAL | Status: DC
  Administered 2021-11-10 – 2021-11-11 (×2): 5000 [IU] via ORAL
  Filled 2021-11-10 (×2): qty 5

## 2021-11-10 MED ORDER — TRANEXAMIC ACID-NACL 1000-0.7 MG/100ML-% IV SOLN
INTRAVENOUS | Status: AC
  Filled 2021-11-10: qty 100

## 2021-11-10 MED ORDER — TRANEXAMIC ACID-NACL 1000-0.7 MG/100ML-% IV SOLN: 1000.0000 mg | Freq: Once | INTRAVENOUS | Status: AC

## 2021-11-10 MED ORDER — SODIUM CHLORIDE FLUSH 0.9 % IV SOLN
INTRAVENOUS | Status: AC
  Filled 2021-11-10: qty 10

## 2021-11-10 MED ORDER — PROPOFOL 500 MG/50ML IV EMUL
INTRAVENOUS | Status: DC | PRN
  Administered 2021-11-10: 150 ug/kg/min via INTRAVENOUS

## 2021-11-10 MED ORDER — MENTHOL 3 MG MT LOZG: 1.0000 | LOZENGE | OROMUCOSAL | Status: DC | PRN

## 2021-11-10 MED ORDER — PHENYLEPHRINE HCL-NACL 20-0.9 MG/250ML-% IV SOLN
INTRAVENOUS | Status: DC | PRN
  Administered 2021-11-10: 40 ug/min via INTRAVENOUS

## 2021-11-10 MED ORDER — ENOXAPARIN SODIUM 30 MG/0.3ML IJ SOSY
30.0000 mg | PREFILLED_SYRINGE | Freq: Two times a day (BID) | INTRAMUSCULAR | Status: DC
  Administered 2021-11-11: 30 mg via SUBCUTANEOUS
  Filled 2021-11-10: qty 0.3

## 2021-11-10 MED ORDER — OXYCODONE HCL 5 MG/5ML PO SOLN: 5.0000 mg | Freq: Once | ORAL | Status: DC | PRN

## 2021-11-10 MED ORDER — ENSURE PRE-SURGERY PO LIQD: 296.0000 mL | Freq: Once | ORAL | Status: DC

## 2021-11-10 MED ORDER — ONDANSETRON HCL 4 MG/2ML IJ SOLN: 4.0000 mg | Freq: Once | INTRAMUSCULAR | Status: DC | PRN

## 2021-11-10 MED ORDER — FLUTICASONE PROPIONATE 50 MCG/ACT NA SUSP: 1.0000 | NASAL | Status: DC | PRN

## 2021-11-10 SURGICAL SUPPLY — 63 items
BLADE DRUM FLTD (BLADE) ×2 IMPLANT
BLADE SAW 90X25X1.19 OSCILLAT (BLADE) ×2 IMPLANT
CARTRIDGE OIL MAESTRO DRILL (MISCELLANEOUS) ×1 IMPLANT
DIFFUSER DRILL AIR PNEUMATIC (MISCELLANEOUS) ×2 IMPLANT
DRAPE 3/4 80X56 (DRAPES) ×2 IMPLANT
DRAPE INCISE IOBAN 66X60 STRL (DRAPES) ×2 IMPLANT
DRSG DERMACEA 8X12 NADH (GAUZE/BANDAGES/DRESSINGS) ×2 IMPLANT
DRSG MEPILEX SACRM 8.7X9.8 (GAUZE/BANDAGES/DRESSINGS) ×2 IMPLANT
DRSG OPSITE POSTOP 4X12 (GAUZE/BANDAGES/DRESSINGS) ×2 IMPLANT
DRSG OPSITE POSTOP 4X14 (GAUZE/BANDAGES/DRESSINGS) IMPLANT
DRSG TEGADERM 4X4.75 (GAUZE/BANDAGES/DRESSINGS) ×2 IMPLANT
DURAPREP 26ML APPLICATOR (WOUND CARE) ×4 IMPLANT
ELECT CAUTERY BLADE 6.4 (BLADE) ×2 IMPLANT
ELECT REM PT RETURN 9FT ADLT (ELECTROSURGICAL) ×2
ELECTRODE REM PT RTRN 9FT ADLT (ELECTROSURGICAL) ×1 IMPLANT
GLOVE BIO SURGEON STRL SZ7.5 (GLOVE) ×4 IMPLANT
GLOVE BIOGEL M STRL SZ7.5 (GLOVE) ×4 IMPLANT
GLOVE SURG UNDER LTX SZ8 (GLOVE) ×2 IMPLANT
GLOVE SURG UNDER POLY LF SZ7.5 (GLOVE) ×2 IMPLANT
GOWN STRL REUS W/ TWL LRG LVL3 (GOWN DISPOSABLE) ×2 IMPLANT
GOWN STRL REUS W/ TWL XL LVL3 (GOWN DISPOSABLE) ×1 IMPLANT
GOWN STRL REUS W/TWL LRG LVL3 (GOWN DISPOSABLE) ×2
GOWN STRL REUS W/TWL XL LVL3 (GOWN DISPOSABLE) ×1
HEAD FEM STD 32X+1 STRL (Hips) ×1 IMPLANT
HEMOVAC 400CC 10FR (MISCELLANEOUS) ×2 IMPLANT
HOLDER FOLEY CATH W/STRAP (MISCELLANEOUS) ×2 IMPLANT
HOLSTER ELECTROSUGICAL PENCIL (MISCELLANEOUS) ×2 IMPLANT
HOOD PEEL AWAY FLYTE STAYCOOL (MISCELLANEOUS) ×4 IMPLANT
IV NS IRRIG 3000ML ARTHROMATIC (IV SOLUTION) ×2 IMPLANT
KIT PEG BOARD PINK (KITS) ×2 IMPLANT
KIT TURNOVER KIT A (KITS) ×2 IMPLANT
LINER MARATHON 10 DEG 32MMX450 (Hips) ×1 IMPLANT
LINER MARATHON 10D 32MMX450 (Hips) IMPLANT
MANIFOLD NEPTUNE II (INSTRUMENTS) ×4 IMPLANT
NDL SAFETY ECLIPSE 18X1.5 (NEEDLE) ×1 IMPLANT
NEEDLE HYPO 18GX1.5 SHARP (NEEDLE)
NS IRRIG 1000ML POUR BTL (IV SOLUTION) ×2 IMPLANT
NS IRRIG 500ML POUR BTL (IV SOLUTION) ×1 IMPLANT
OIL CARTRIDGE MAESTRO DRILL (MISCELLANEOUS) ×2
PACK HIP PROSTHESIS (MISCELLANEOUS) ×2 IMPLANT
PENCIL SMOKE EVACUATOR COATED (MISCELLANEOUS) ×1 IMPLANT
PIN SECT CUP 50MM (Hips) ×1 IMPLANT
PULSAVAC PLUS IRRIG FAN TIP (DISPOSABLE) ×2
SOL PREP PVP 2OZ (MISCELLANEOUS) ×2
SOLUTION IRRIG SURGIPHOR (IV SOLUTION) ×2 IMPLANT
SOLUTION PREP PVP 2OZ (MISCELLANEOUS) ×1 IMPLANT
SPONGE DRAIN TRACH 4X4 STRL 2S (GAUZE/BANDAGES/DRESSINGS) ×2 IMPLANT
STAPLER SKIN PROX 35W (STAPLE) ×2 IMPLANT
STEM FEM CMNTLSS SM AML 15.0 (Hips) ×1 IMPLANT
SUT ETHIBOND #5 BRAIDED 30INL (SUTURE) ×2 IMPLANT
SUT VIC AB 0 CT1 36 (SUTURE) ×2 IMPLANT
SUT VIC AB 1 CT1 36 (SUTURE) ×4 IMPLANT
SUT VIC AB 2-0 CT1 27 (SUTURE) ×1
SUT VIC AB 2-0 CT1 TAPERPNT 27 (SUTURE) ×1 IMPLANT
SYR 20ML LL LF (SYRINGE) ×1 IMPLANT
TAPE CLOTH 3X10 WHT NS LF (GAUZE/BANDAGES/DRESSINGS) ×2 IMPLANT
TAPE TRANSPORE STRL 2 31045 (GAUZE/BANDAGES/DRESSINGS) ×2 IMPLANT
TIP FAN IRRIG PULSAVAC PLUS (DISPOSABLE) ×1 IMPLANT
TOWEL OR 17X26 4PK STRL BLUE (TOWEL DISPOSABLE) IMPLANT
TRAY FOLEY MTR SLVR 16FR STAT (SET/KITS/TRAYS/PACK) ×2 IMPLANT
TUBE KAMVAC SUCTION (TUBING) ×2 IMPLANT
WATER STERILE IRR 1000ML POUR (IV SOLUTION) ×3 IMPLANT
WATER STERILE IRR 500ML POUR (IV SOLUTION) ×1 IMPLANT

## 2021-11-10 NOTE — Transfer of Care (Signed)
Immediate Anesthesia Transfer of Care Note  Patient: Houa Nie  Procedure(s) Performed: TOTAL HIP ARTHROPLASTY (Right: Hip)  Patient Location: PACU  Anesthesia Type:Spinal  Level of Consciousness: awake, alert  and oriented  Airway & Oxygen Therapy: Patient Spontanous Breathing and Patient connected to face mask oxygen  Post-op Assessment: Report given to RN and Post -op Vital signs reviewed and stable  Post vital signs: Reviewed and stable  Last Vitals:  Vitals Value Taken Time  BP 117/57 11/10/21 1153  Temp 36.3 C 11/10/21 1153  Pulse 81 11/10/21 1157  Resp 29 11/10/21 1157  SpO2 96 % 11/10/21 1157  Vitals shown include unvalidated device data.  Last Pain:  Vitals:   11/10/21 1153  TempSrc:   PainSc: Asleep      Patients Stated Pain Goal: 0 (54/62/70 3500)  Complications: No notable events documented.

## 2021-11-10 NOTE — Op Note (Signed)
OPERATIVE NOTE  DATE OF SURGERY:  11/10/2021  PATIENT NAME:  Taylor Chambers   DOB: [birth year]  MRN: [id number]  PRE-OPERATIVE DIAGNOSIS: Degenerative arthrosis of the right hip, primary  POST-OPERATIVE DIAGNOSIS:  Same  PROCEDURE:  Right total hip arthroplasty  SURGEON:  Marciano Sequin. M.D.  ASSISTANT: Cassell Smiles, PA-C (present and scrubbed throughout the case, critical for assistance with exposure, retraction, instrumentation, and closure)  ANESTHESIA: general  ESTIMATED BLOOD LOSS: 100 mL  FLUIDS REPLACED: 1400 mL of crystalloid  DRAINS: 2 medium Hemovac drains  IMPLANTS UTILIZED: DePuy 15 mm small stature AML femoral stem, 50 mm OD Pinnacle 100 acetabular component, +4 mm 10 degree Pinnacle Marathon polyethylene insert, and a 32 mm CoCr +1 mm hip ball  INDICATIONS FOR SURGERY: Taylor Chambers is a 76 y.o. year old female with a long history of progressive hip and groin  pain. X-rays demonstrated severe degenerative changes. The patient had not seen any significant improvement despite conservative nonsurgical intervention. After discussion of the risks and benefits of surgical intervention, the patient expressed understanding of the risks benefits and agree with plans for total hip arthroplasty.   The risks, benefits, and alternatives were discussed at length including but not limited to the risks of infection, bleeding, nerve injury, stiffness, blood clots, the need for revision surgery, limb length inequality, dislocation, cardiopulmonary complications, among others, and they were willing to proceed.  PROCEDURE IN DETAIL: The patient was brought into the operating room and, after adequate spinal anesthesia was achieved, the patient was placed in a left lateral decubitus position. Axillary roll was placed and all bony prominences were well-padded. The patient's right hip was cleaned and prepped with alcohol and DuraPrep and draped in the usual sterile fashion. A "timeout" was performed as  per usual protocol. A lateral curvilinear incision was made gently curving towards the posterior superior iliac spine. The IT band was incised in line with the skin incision and the fibers of the gluteus maximus were split in line. The piriformis tendon was identified, skeletonized, and incised at its insertion to the proximal femur and reflected posteriorly. A T type posterior capsulotomy was performed. Prior to dislocation of the femoral head, a threaded Steinmann pin was inserted through a separate stab incision into the pelvis superior to the acetabulum and bent in the form of a stylus so as to assess limb length and hip offset throughout the procedure. The femoral head was then dislocated posteriorly. Inspection of the femoral head demonstrated severe degenerative changes with full-thickness loss of articular cartilage. The femoral neck cut was performed using an oscillating saw. The anterior capsule was elevated off of the femoral neck using a periosteal elevator. Attention was then directed to the acetabulum. The remnant of the labrum was excised using electrocautery. Inspection of the acetabulum also demonstrated significant degenerative changes. The acetabulum was reamed in sequential fashion up to a 49 mm diameter. Good punctate bleeding bone was encountered. A 50 mm Pinnacle 100 acetabular component was positioned and impacted into place. Good scratch fit was appreciated. A +4 mm neutral polyethylene trial was inserted.  Attention was then directed to the proximal femur. A hole for reaming of the proximal femoral canal was created using a high-speed burr. The femoral canal was reamed in sequential fashion up to a 14.5 mm diameter. This allowed for approximately 5 cm of scratch fit. Serial broaches were inserted up to a 15 mm small stature femoral broach. Calcar region was planed and a trial reduction was performed using  a 32 mm hip ball with a +1 mm neck length.  Good stability was noted but it was  elected to trial with a +4 mm 10 degree trial polyethylene with the high side positioned at the 8 o'clock position.  Good equalization of limb lengths and hip offset was appreciated and excellent stability was noted both anteriorly and posteriorly. Trial components were removed. The acetabular shell was irrigated with copious amounts of normal saline with antibiotic solution and suctioned dry. A +4 mm 10 degree Pinnacle Marathon polyethylene insert was positioned with the high side at the 8 o'clock position and impacted into place. Next, a 15 mm small stature AML femoral stem was positioned and impacted into place. Excellent scratch fit was appreciated. A trial reduction was again performed with a 32 mm hip ball with a S1 mm neck length. Again, good equalization of limb lengths was appreciated and excellent stability appreciated both anteriorly and posteriorly. The hip was then dislocated and the trial hip ball was removed. The Morse taper was cleaned and dried. A 32 mm cobalt chrome hip ball with a +1 mm neck length was placed on the trunnion and impacted into place. The hip was then reduced and placed through range of motion. Excellent stability was appreciated both anteriorly and posteriorly.  The wound was irrigated with copious amounts of normal saline followed by 450 ml of Surgiphor and suctioned dry. Good hemostasis was appreciated. The posterior capsulotomy was repaired using #5 Ethibond. Piriformis tendon was reapproximated to the undersurface of the gluteus medius tendon using #5 Ethibond. The IT band was reapproximated using interrupted sutures of #1 Vicryl. Subcutaneous tissue was approximated using first #0 Vicryl followed by #2-0 Vicryl. The skin was closed with skin staples.  The patient tolerated the procedure well and was transported to the recovery room in stable condition.   Marciano Sequin., M.D.

## 2021-11-10 NOTE — H&P (Signed)
The patient has been re-examined, and the chart reviewed, and there have been no interval changes to the documented history and physical.    The risks, benefits, and alternatives have been discussed at length. The patient expressed understanding of the risks benefits and agreed with plans for surgical intervention.  Grigor Lipschutz P. Wylma Tatem, Jr. M.D.    

## 2021-11-10 NOTE — Anesthesia Procedure Notes (Signed)
Spinal  Patient location during procedure: OR Start time: 11/10/2021 8:30 AM End time: 11/10/2021 8:37 AM Reason for block: surgical anesthesia Staffing Performed: resident/CRNA  Anesthesiologist: Piscitello, Precious Haws, MD Resident/CRNA: Nelda Marseille, CRNA Preanesthetic Checklist Completed: patient identified, IV checked, site marked, risks and benefits discussed, surgical consent, monitors and equipment checked, pre-op evaluation and timeout performed Spinal Block Patient position: sitting Prep: ChloraPrep Patient monitoring: heart rate, continuous pulse ox, blood pressure and cardiac monitor Approach: midline Location: L3-4 Injection technique: single-shot Needle Needle type: Whitacre and Introducer  Needle gauge: 25 G Needle length: 9 cm Assessment Sensory level: T10 Events: CSF return Additional Notes Sterile aseptic technique used throughout the procedure.  Negative paresthesia. Negative blood return. Positive free-flowing CSF. Expiration date of kit checked and confirmed. Patient tolerated procedure well, without complications.

## 2021-11-10 NOTE — Anesthesia Preprocedure Evaluation (Signed)
Anesthesia Evaluation  Patient identified by MRN, date of birth, ID band Patient awake    Reviewed: Allergy & Precautions, NPO status , Patient's Chart, lab work & pertinent test results  History of Anesthesia Complications Negative for: history of anesthetic complications  Airway Mallampati: II  TM Distance: >3 FB Neck ROM: Full    Dental  (+) Edentulous Upper, Edentulous Lower   Pulmonary neg sleep apnea, COPD,  COPD inhaler, Patient abstained from smoking.Not current smoker, former smoker,  Quit smoking two months ago Took inhaler today     + decreased breath sounds      Cardiovascular Exercise Tolerance: Good METShypertension, Pt. on medications + CAD and + DOE  (-) Past MI (-) dysrhythmias  Rhythm:Regular Rate:Normal - Systolic murmurs    Neuro/Psych  Headaches, PSYCHIATRIC DISORDERS Anxiety Depression Bipolar Disorder    GI/Hepatic neg GERD  ,(+)     (-) substance abuse  ,   Endo/Other  neg diabetes  Renal/GU negative Renal ROS     Musculoskeletal   Abdominal   Peds  Hematology   Anesthesia Other Findings Past Medical History: No date: Allergies No date: Anxiety No date: Aortic atherosclerosis (HCC) No date: Bipolar affective disorder ([facility]) No date: COPD (chronic obstructive pulmonary disease) (HCC) No date: Coronary artery calcification seen on CT scan No date: Depression 73/53/2992: Diastolic dysfunction     Comment:  a.) TTE 02/24/2020: EF >55%; triv PR, mild TR, mod MR;               G1DD. No date: DOE (dyspnea on exertion) No date: Emphysema lung (HCC) No date: History of cataract No date: HLD (hyperlipidemia) No date: Hypertension No date: Macular degeneration No date: Migraines No date: Osteoporosis No date: Pneumonia No date: Tobacco use  Reproductive/Obstetrics                             Anesthesia Physical Anesthesia Plan  ASA: 3  Anesthesia Plan:  Spinal   Post-op Pain Management: Ofirmev IV (intra-op)* and Gabapentin PO (pre-op)*   Induction: Intravenous  PONV Risk Score and Plan: 2 and Ondansetron, Dexamethasone, Propofol infusion, TIVA and Treatment may vary due to age or medical condition  Airway Management Planned: Natural Airway  Additional Equipment: None  Intra-op Plan:   Post-operative Plan:   Informed Consent: I have reviewed the patients History and Physical, chart, labs and discussed the procedure including the risks, benefits and alternatives for the proposed anesthesia with the patient or authorized representative who has indicated his/her understanding and acceptance.       Plan Discussed with: CRNA and Surgeon  Anesthesia Plan Comments: (Discussed R/B/A of neuraxial anesthesia technique with patient: - rare risks of spinal/epidural hematoma, nerve damage, infection - Risk of PDPH - Risk of nausea and vomiting - Risk of conversion to general anesthesia and its associated risks, including sore throat, damage to lips/eyes/teeth/oropharynx, and rare risks such as cardiac and respiratory events. - Risk of allergic reactions  Discussed the role of CRNA in patient's perioperative care.  Patient voiced understanding.)        Anesthesia Quick Evaluation

## 2021-11-10 NOTE — Anesthesia Procedure Notes (Signed)
Date/Time: 11/10/2021 8:38 AM Performed by: Nelda Marseille, CRNA Pre-anesthesia Checklist: Patient identified, Emergency Drugs available, Suction available, Patient being monitored and Timeout performed Oxygen Delivery Method: Simple face mask

## 2021-11-10 NOTE — Evaluation (Signed)
Physical Therapy Evaluation Patient Details Name: Taylor Chambers MRN: [id number] DOB: [birth year] Today's Date: 11/10/2021  History of Present Illness  Pt  is a 76 y.o. female s/p R THA 11/10/21 w/ posterior hip approach. PMH includes COPD, HTN, GERD, osteoporosis, DOE, and bipolar disorder.  Clinical Impression  Pt was pleasant and motivated to participate during the session and put forth good effort throughout. Pt was able to perform bed mobility w/ supervision and min verbal cuing to safely maintain hip precautions. Pt was able to perform sit to stand with no excessive hip flexion and required verbal cuing for sequencing. Pt was able to safely ambulate to the recliner and perform 90 degree right turns to avoid CKC R hip IR following verbal cuing for sequencing. Pt with some hesitation to bear weight through RLE but improved as session progressed. Pt will benefit from HHPT upon discharge to safely address deficits listed in patient problem list for decreased caregiver assistance and eventual return to PLOF.      Recommendations for follow up therapy are one component of a multi-disciplinary discharge planning process, led by the attending physician.  Recommendations may be updated based on patient status, additional functional criteria and insurance authorization.  Follow Up Recommendations Home health PT    Assistance Recommended at Discharge Intermittent Supervision/Assistance  Patient can return home with the following  A little help with walking and/or transfers;A little help with bathing/dressing/bathroom;Assistance with cooking/housework;Help with stairs or ramp for entrance    Equipment Recommendations Rolling walker (2 wheels)  Recommendations for Other Services       Functional Status Assessment Patient has had a recent decline in their functional status and demonstrates the ability to make significant improvements in function in a reasonable and predictable amount of time.      Precautions / Restrictions Precautions Precautions: Posterior Hip;Fall Precaution Booklet Issued: Yes (comment) Restrictions Weight Bearing Restrictions: Yes RLE Weight Bearing: Weight bearing as tolerated      Mobility  Bed Mobility Overal bed mobility: Needs Assistance Bed Mobility: Supine to Sit     Supine to sit: Supervision     General bed mobility comments: extra time and effort required w/ verbal cuing to maintain hip precautions    Transfers Overall transfer level: Needs assistance Equipment used: Rolling walker (2 wheels) Transfers: Sit to/from Stand Sit to Stand: Min guard           General transfer comment: verbal cuing for hand placement and foot placement to maintain hip precaution    Ambulation/Gait Ambulation/Gait assistance: Min guard Gait Distance (Feet): 4 Feet Assistive device: Rolling walker (2 wheels) Gait Pattern/deviations: Step-to pattern, Decreased step length - right, Decreased step length - left, Decreased stance time - right       General Gait Details: extra time and effort required and verbal cuing for sequencing to maintain hip precuation  Stairs            Wheelchair Mobility    Modified Rankin (Stroke Patients Only)       Balance Overall balance assessment: Needs assistance Sitting-balance support: Bilateral upper extremity supported, Feet supported Sitting balance-Leahy Scale: Good     Standing balance support: Bilateral upper extremity supported, During functional activity Standing balance-Leahy Scale: Fair                               Pertinent Vitals/Pain Pain Assessment Pain Assessment: 0-10 Pain Score: 3  Pain Location: R hip Pain Descriptors /  Indicators: Aching Pain Intervention(s): Monitored during session, Premedicated before session, Repositioned    Home Living Family/patient expects to be discharged to:: Private residence Living Arrangements: Spouse/significant other  (husband) Available Help at Discharge: Family;Available 24 hours/day Type of Home: House Home Access: Stairs to enter   [employer] of Steps: 18 steps in basement entry w/ chair lift   Home Layout: Two level Home Equipment: Standard Walker;Cane - quad;BSC/3in1;Grab bars - tub/shower;Hand held shower head      Prior Function Prior Level of Function : Independent/Modified Independent             Mobility Comments: Independent household ambulator w/ no AD and community ambulator using cane for longer distances. Pt reports no hx of falls ADLs Comments: Indpendent w/ ADLs     Hand Dominance        Extremity/Trunk Assessment   Upper Extremity Assessment Upper Extremity Assessment: Overall WFL for tasks assessed    Lower Extremity Assessment Lower Extremity Assessment: Generalized weakness       Communication   Communication: No difficulties  Cognition Arousal/Alertness: Awake/alert Behavior During Therapy: WFL for tasks assessed/performed Overall Cognitive Status: Within Functional Limits for tasks assessed                                          General Comments      Exercises Total Joint Exercises Ankle Circles/Pumps: Strengthening, Both, 10 reps Quad Sets: Strengthening, Both, 10 reps Gluteal Sets: Strengthening, Both, 10 reps Knee Flexion: Strengthening, Both, 10 reps Other Exercises: Positioning education to maintain hip pre cautions Other Exercises: Pt educated on 90 degree R turn to prevent CKC R hip IR Other Exercises: HEP education per handout   Assessment/Plan    PT Assessment Patient needs continued PT services  PT Problem List Decreased strength;Decreased mobility;Decreased activity tolerance;Decreased balance       PT Treatment Interventions DME instruction;Therapeutic exercise;Balance training;Gait training;Stair training;Functional mobility training;Therapeutic activities;Patient/family education    PT Goals  (Current goals can be found in the Care Plan section)  Acute Rehab PT Goals Patient Stated Goal: Pt would like to get back to PLOF independent walking PT Goal Formulation: With patient Time For Goal Achievement: 11/23/21 Potential to Achieve Goals: Good    Frequency BID     Co-evaluation               AM-PAC PT "6 Clicks" Mobility  Outcome Measure Help needed turning from your back to your side while in a flat bed without using bedrails?: A Little Help needed moving from lying on your back to sitting on the side of a flat bed without using bedrails?: A Little Help needed moving to and from a bed to a chair (including a wheelchair)?: A Little Help needed standing up from a chair using your arms (e.g., wheelchair or bedside chair)?: A Little Help needed to walk in hospital room?: A Little Help needed climbing 3-5 steps with a railing? : A Little 6 Click Score: 18    End of Session Equipment Utilized During Treatment: Gait belt Activity Tolerance: Patient tolerated treatment well;No increased pain Patient left: in chair;with call bell/phone within reach;with chair alarm set;with family/visitor present Nurse Communication: Mobility status;Precautions;Weight bearing status PT Visit Diagnosis: Other abnormalities of gait and mobility (R26.89);Muscle weakness (generalized) (M62.81)    Time: 4765-4650 PT Time Calculation (min) (ACUTE ONLY): 47 min   Charges:  Turner Daniels, SPT  11/10/2021, 5:36 PM

## 2021-11-11 DIAGNOSIS — M1611 Unilateral primary osteoarthritis, right hip: Secondary | ICD-10-CM | POA: Diagnosis not present

## 2021-11-11 LAB — TYPE AND SCREEN
ABO/RH(D): A POS
Antibody Screen: POSITIVE
Unit division: 0
Unit division: 0

## 2021-11-11 LAB — BPAM RBC
Blood Product Expiration Date: 202306292359
Blood Product Expiration Date: 202306292359
Unit Type and Rh: 6200
Unit Type and Rh: 6200

## 2021-11-11 MED ORDER — CELECOXIB 200 MG PO CAPS: 200.0000 mg | ORAL_CAPSULE | Freq: Two times a day (BID) | ORAL | 0 refills | Status: DC

## 2021-11-11 MED ORDER — OXYCODONE HCL 5 MG PO TABS: 5.0000 mg | ORAL_TABLET | ORAL | 0 refills | Status: DC | PRN

## 2021-11-11 MED ORDER — ENOXAPARIN SODIUM 40 MG/0.4ML IJ SOSY: 40.0000 mg | PREFILLED_SYRINGE | INTRAMUSCULAR | 0 refills | Status: DC

## 2021-11-11 MED ORDER — IPRATROPIUM-ALBUTEROL 0.5-2.5 (3) MG/3ML IN SOLN
3.0000 mL | Freq: Two times a day (BID) | RESPIRATORY_TRACT | Status: DC
Start: 2021-11-11 — End: 2021-11-11

## 2021-11-11 MED ORDER — TRAMADOL HCL 50 MG PO TABS
50.0000 mg | ORAL_TABLET | ORAL | 0 refills | Status: DC | PRN
Start: 2021-11-11 — End: 2023-04-14

## 2021-11-11 NOTE — Anesthesia Postprocedure Evaluation (Signed)
Anesthesia Post Note  Patient: Taylor Chambers  Procedure(s) Performed: TOTAL HIP ARTHROPLASTY (Right: Hip)  Patient location during evaluation: Nursing Unit Anesthesia Type: Spinal Level of consciousness: oriented and awake and alert Pain management: pain level controlled Vital Signs Assessment: post-procedure vital signs reviewed and stable Respiratory status: spontaneous breathing and respiratory function stable Cardiovascular status: blood pressure returned to baseline and stable Postop Assessment: no headache, no backache, no apparent nausea or vomiting and patient able to bend at knees Anesthetic complications: no   No notable events documented.   Last Vitals:  Vitals:   11/11/21 0348 11/11/21 0501  BP:  139/71  Pulse:  (!) 109  Resp:  17  Temp:  37.1 C  SpO2: 96% 92%    Last Pain:  Vitals:   11/11/21 0517  TempSrc:   PainSc: 2                  Alison Stalling

## 2021-11-11 NOTE — Progress Notes (Signed)
Met with the patient in the room at the bedside The patient lives at Home with her husband The patient  currently has DME including a 3 in 1 standard walker, rolling walker, and rolator The patient will  not need additional They have transportation with her husband They can afford their medication  They are set up with [facility] for Home health services   Admitted for: right hip surgery

## 2021-11-11 NOTE — Progress Notes (Signed)
  Subjective: 1 Day Post-Op Procedure(s) (LRB): TOTAL HIP ARTHROPLASTY (Right) Patient reports pain as well-controlled.   Patient is well, and has had no acute complaints or problems Plan is to go Home after hospital stay. Negative for chest pain and shortness of breath Fever: no Gastrointestinal: negative for nausea and vomiting.  Patient has not had a bowel movement.  Objective: Vital signs in last 24 hours: Temp:  [97.4 F (36.3 C)-98.8 F (37.1 C)] 98.2 F (36.8 C) (06/08 0809) Pulse Rate:  [81-109] 93 (06/08 0813) Resp:  [16-20] 16 (06/08 0813) BP: (101-158)/(56-94) 143/86 (06/08 0809) SpO2:  [92 %-100 %] 94 % (06/08 0813)  Intake/Output from previous day:  Intake/Output Summary (Last 24 hours) at 11/11/2021 0825 Last data filed at 11/11/2021 0522 Gross per 24 hour  Intake 2225 ml  Output 495 ml  Net 1730 ml    Intake/Output this shift: No intake/output data recorded.  Labs: Recent Labs    11/10/21 0757  HGB 15.0   Recent Labs    11/10/21 0757  HCT 44.0   Recent Labs    11/10/21 0757  NA 140  K 4.8  CL 106  BUN 10  CREATININE 0.50  GLUCOSE 103*   No results for input(s): "LABPT", "INR" in the last 72 hours.   EXAM General - Patient is Alert, Appropriate, and Oriented Extremity - Neurovascular intact Dorsiflexion/Plantar flexion intact Compartment soft Dressing/Incision -clean, dry, no drainage, Hemovac in place.  Motor Function - intact, moving foot and toes well on exam.  Cardiovascular- Regular rate and rhythm, no murmurs/rubs/gallops Respiratory- Lungs clear to auscultation bilaterally Gastrointestinal- soft, nontender, and active bowel sounds   Assessment/Plan: 1 Day Post-Op Procedure(s) (LRB): TOTAL HIP ARTHROPLASTY (Right) Principal Problem:   Hx of total hip arthroplasty, right  Estimated body mass index is 20.3 kg/m as calculated from the following:   Height as of this encounter: '5\' 2"'$  (1.575 m).   Weight as of this encounter:  50.3 kg. Advance diet Up with therapy  Discharge with [facility] anticipated this PM pending completion of therapy goals   Hemovac removed., Mini compression dressing applied. , and Fresh honeycomb dressing applied.   DVT Prophylaxis - Lovenox, Ted hose, and SCDs Weight-Bearing as tolerated to right leg  Cassell Smiles, PA-C [facility] Orthopaedic Surgery 11/11/2021, 8:25 AM

## 2021-11-11 NOTE — Evaluation (Signed)
Occupational Therapy Evaluation Patient Details Name: Taylor Chambers MRN: [id number] DOB: 1946/05/26 Today's Date: 11/11/2021   History of Present Illness Pt is a 76 y/o F s/p R THA w/ posterior approach 11/10/21. PMH COPD, HTN, GERD, osteoporosis, DOE, and bipolar disorder.   Clinical Impression   Pt was seen for OT evaluation this date. Prior to hospital admission, pt was independent with mobility and all ADLs. Pt lives with husband in two level house with 18 stairs and a chair lift to reach the entrance. Pt presents to acute OT demonstrating impaired ADL performance and functional mobility 2/2 R THA w/ posterior approach - functional strength/ROM limitations. Pt currently requires MOD A for threading/unthreading underpants/pants and donning/doffing socks - pt husband present and demonstrated safe assistance with dressing and MOD I + RW for grooming tasks. SUPERVISION + RW with min vcs to maintain pcns for STS and ADL t/f. Pt and husband educated on maintaining pcns during functional ADLs and donning/doffing compression stockings. All questions from pt and family answered within scope of practice. All education complete, will sign off. Upon hospital discharge, recommend no OT follow up.     Recommendations for follow up therapy are one component of a multi-disciplinary discharge planning process, led by the attending physician.  Recommendations may be updated based on patient status, additional functional criteria and insurance authorization.   Follow Up Recommendations  No OT follow up    Assistance Recommended at Discharge Intermittent Supervision/Assistance  Patient can return home with the following A lot of help with bathing/dressing/bathroom;Assistance with cooking/housework;Assist for transportation    Functional Status Assessment  Patient has had a recent decline in their functional status and demonstrates the ability to make significant improvements in function in a reasonable and  predictable amount of time.  Equipment Recommendations  None recommended by OT    Recommendations for Other Services       Precautions / Restrictions Precautions Precautions: Posterior Hip;Fall Precaution Booklet Issued: Yes (comment) Restrictions Weight Bearing Restrictions: Yes RLE Weight Bearing: Weight bearing as tolerated      Mobility Bed Mobility               General bed mobility comments: Not tested; pt seated in chair at start and seated at EOB at end of session.    Transfers Overall transfer level: Needs assistance Equipment used: Rolling walker (2 wheels) Transfers: Sit to/from Stand Sit to Stand: Supervision           General transfer comment: Pt required vcs for maintaining posterior hip precautions      Balance Overall balance assessment: Modified Independent Sitting-balance support: Feet supported, No upper extremity supported Sitting balance-Leahy Scale: Good     Standing balance support: Bilateral upper extremity supported, During functional activity Standing balance-Leahy Scale: Good                             ADL either performed or assessed with clinical judgement   ADL Overall ADL's : Needs assistance/impaired                                       General ADL Comments: MOD A for threading/unthreading underpants/pants and donning/doffing socks - pt husband present and able to assist with dressing. SUPERVISION with min vcs to maintain pcns for STS and ADL t/f.      Pertinent Vitals/Pain Pain Assessment Pain  Assessment: 0-10 Pain Score: 8  (Pt reported 7/10 pain at start of session and 8/10 pain with mobility) Pain Location: R hip Pain Descriptors / Indicators: Discomfort Pain Intervention(s): Limited activity within patient's tolerance, Monitored during session     Hand Dominance     Extremity/Trunk Assessment Upper Extremity Assessment Upper Extremity Assessment: Overall WFL for tasks  assessed   Lower Extremity Assessment Lower Extremity Assessment: Generalized weakness (R THA)       Communication Communication Communication: No difficulties   Cognition Arousal/Alertness: Awake/alert Behavior During Therapy: WFL for tasks assessed/performed Overall Cognitive Status: Within Functional Limits for tasks assessed                                                  Home Living Family/patient expects to be discharged to:: Private residence Living Arrangements: Spouse/significant other (husband) Available Help at Discharge: Family;Available 24 hours/day Type of Home: House Home Access: Stairs to enter [employer] of Steps: 18 steps in basement entry w/ chair lift   Home Layout: Two level;Able to live on main level with bedroom/bathroom     Bathroom Shower/Tub: Tub/shower unit;Walk-in shower   Bathroom Toilet: Standard     Home Equipment: Standard Walker;Grab bars - tub/shower;Hand held shower head;BSC/3in1;Cane - quad          Prior Functioning/Environment Prior Level of Function : Independent/Modified Independent;Driving             Mobility Comments: No hx of falls; no AD ADLs Comments: Independent w/ ADLs        OT Problem List: Decreased strength;Decreased activity tolerance;Decreased range of motion      OT Treatment/Interventions:      OT Goals(Current goals can be found in the care plan section) Acute Rehab OT Goals Patient Stated Goal: to go home OT Goal Formulation: With patient/family Time For Goal Achievement: 11/25/21 Potential to Achieve Goals: Good  OT Frequency:         AM-PAC OT "6 Clicks" Daily Activity     Outcome Measure Help from another person eating meals?: None Help from another person taking care of personal grooming?: None Help from another person toileting, which includes using toliet, bedpan, or urinal?: A Little Help from another person bathing (including washing, rinsing,  drying)?: A Lot Help from another person to put on and taking off regular upper body clothing?: A Little Help from another person to put on and taking off regular lower body clothing?: A Lot 6 Click Score: 18   End of Session Equipment Utilized During Treatment: Gait belt;Rolling walker (2 wheels)  Activity Tolerance: Patient tolerated treatment well Patient left: in bed;with call bell/phone within reach;with family/visitor present  OT Visit Diagnosis: Muscle weakness (generalized) (M62.81)                Time: 0934-1000 OT Time Calculation (min): 26 min Charges:  OT General Charges $OT Visit: 1 Visit OT Evaluation $OT Eval Low Complexity: 1 Low OT Treatments $Self Care/Home Management : 8-22 mins  [employer], OTDS  [employer] 11/11/2021, 11:22 AM

## 2021-11-11 NOTE — Discharge Summary (Signed)
Physician Discharge Summary  Patient ID: Taylor Chambers MRN: [id number] DOB/AGE: 1945-08-11 76 y.o.  Admit date: 11/10/2021 Discharge date: 11/11/2021  Admission Diagnoses:  Hx of total hip arthroplasty, right [Z96.641]  Surgeries:Procedure(s):  Right total hip arthroplasty   SURGEON:  Marciano Sequin. M.D.   ASSISTANT: Cassell Smiles, PA-C (present and scrubbed throughout the case, critical for assistance with exposure, retraction, instrumentation, and closure)   ANESTHESIA: general   ESTIMATED BLOOD LOSS: 100 mL   FLUIDS REPLACED: 1400 mL of crystalloid   DRAINS: 2 medium Hemovac drains   IMPLANTS UTILIZED: DePuy 15 mm small stature AML femoral stem, 50 mm OD Pinnacle 100 acetabular component, +4 mm 10 degree Pinnacle Marathon polyethylene insert, and a 32 mm CoCr +1 mm hip ball  Discharge Diagnoses: Patient Active Problem List   Diagnosis Date Noted   Hx of total hip arthroplasty, right 11/10/2021   H/O heartburn 06/24/2021   Centrilobular emphysema ([place]) 02/05/2019   Tobacco use disorder 10/07/2013   Benign essential hypertension 10/07/2013   Symptomatic menopausal or female climacteric states 10/07/2013    Past Medical History:  Diagnosis Date   Allergies    Anxiety    Aortic atherosclerosis (H[facility])    Bipolar affective disorder ([place])    COPD (chronic obstructive pulmonary disease) ([place])    Coronary artery calcification seen on CT scan    Depression    Diastolic dysfunction 35/57/3220   a.) TTE 02/24/2020: EF >55%; triv PR, mild TR, mod MR; G1DD.   DOE (dyspnea on exertion)    Emphysema lung (HCC)    History of cataract    HLD (hyperlipidemia)    Hypertension    Macular degeneration    Migraines    Osteoporosis    Pneumonia    Tobacco use      Transfusion:    Consultants (if any):   Discharged Condition: Improved  Hospital Course: Taylor Chambers is an 76 y.o. female who was admitted 11/10/2021 with a diagnosis of right hip osteoarthritis and went to the  operating room on 11/10/2021 and underwent right total hip arthroplasty through posterior approach. The patient received perioperative antibiotics for prophylaxis (see below). The patient tolerated the procedure well and was transported to PACU in stable condition. After meeting PACU criteria, the patient was subsequently transferred to the Orthopaedics/Rehabilitation unit.   The patient received DVT prophylaxis in the form of early mobilization, Lovenox, TED hose, and SCDs . A sacral pad had been placed and heels were elevated off of the bed with rolled towels in order to protect skin integrity. Foley catheter was discontinued on postoperative day #0. Wound drains were discontinued on postoperative day #1. The surgical incision was healing well without signs of infection.  Physical therapy was initiated postoperatively for transfers, gait training, and strengthening. Occupational therapy was initiated for activities of daily living and evaluation for assisted devices. Rehabilitation goals were reviewed in detail with the patient. The patient made steady progress with physical therapy and physical therapy recommended discharge to Home.   The patient achieved the preliminary goals of this hospitalization and was felt to be medically and orthopaedically appropriate for discharge.  She was given perioperative antibiotics:  Anti-infectives (From admission, onward)    Start     Dose/Rate Route Frequency Ordered Stop   11/10/21 1500  ceFAZolin (ANCEF) IVPB 2g/100 mL premix        2 g 200 mL/hr over 30 Minutes Intravenous Every 6 hours 11/10/21 1258 11/10/21 2058   11/10/21 0726  ceFAZolin (  ANCEF) 2-4 GM/100ML-% IVPB       Note to Pharmacy: [place] [facility], [place]: cabinet override      11/10/21 0726 11/10/21 1929   11/10/21 0600  ceFAZolin (ANCEF) IVPB 2g/100 mL premix  Status:  Discontinued        2 g 200 mL/hr over 30 Minutes Intravenous On call to O.R. 11/09/21 2153 11/10/21 0814     .  Recent vital  signs:  Vitals:   11/11/21 0809 11/11/21 0813  BP: (!) 143/86   Pulse: 94 93  Resp: 18 16  Temp: 98.2 F (36.8 C)   SpO2: 95% 94%    Recent laboratory studies:  Recent Labs    11/10/21 0757  HGB 15.0  HCT 44.0  K 4.8  CL 106  BUN 10  CREATININE 0.50  GLUCOSE 103*    Diagnostic Studies: DG Hip Port Unilat With Pelvis 1V Right  Addendum Date: 11/11/2021   ADDENDUM REPORT: 11/11/2021 09:15 ADDENDUM: The original report was by Dr. Van Clines. The following addendum is by Dr. Van Clines: This case was research further, and it appears that the truncated original lateral hip view was due to a machine error but that the data could be recovered. Accordingly, the data was recovered, and the cross-table lateral view of the right hip is the subject of this addendum. This makes the exam a two view hip radiograph. The cross-table lateral view demonstrates expected alignment and no periprosthetic fracture or acute complicating feature. Electronically Signed   By: Van Clines M.D.   On: 11/11/2021 09:15   Result Date: 11/11/2021 CLINICAL DATA:  Right total hip arthroplasty, postoperative assessment EXAM: DG HIP (WITH OR WITHOUT PELVIS) 1V PORT RIGHT COMPARISON:  Right hip MRI from 08/29/2021 FINDINGS: Right total hip prosthesis noted, without visible periprosthetic fracture or acute complicating feature on the frontal projection. The lateral projection attempt unfortunately excludes the femur and prosthesis, we are happy to re-attempt if clinically warranted but otherwise this is considered a one-view exam. IMPRESSION: 1. Single frontal view demonstrates no periprosthetic fracture or acute complicating feature status post right total hip replacement. The lateral view is nondiagnostic but can be repeated if clinically warranted. Electronically Signed: By: Van Clines M.D. On: 11/10/2021 12:33    Discharge Medications:   Allergies as of 11/11/2021       Reactions   Bupropion          Medication List     STOP taking these medications    aspirin EC 81 MG tablet   ibuprofen 200 MG tablet Commonly known as: ADVIL       TAKE these medications    alendronate 70 MG tablet Commonly known as: FOSAMAX Take 70 mg by mouth once a week. Take with a full glass of water on an empty stomach.   amLODipine 5 MG tablet Commonly known as: NORVASC Take 5 mg by mouth daily.   budesonide 0.25 MG/2ML nebulizer solution Commonly known as: PULMICORT Take 0.25 mg by nebulization 2 (two) times daily.   buPROPion 75 MG tablet Commonly known as: WELLBUTRIN Take 75 mg by mouth 2 (two) times daily.   CALCIUM 500 PO Take by mouth.   celecoxib 200 MG capsule Commonly known as: CELEBREX Take 1 capsule (200 mg total) by mouth 2 (two) times daily.   Combivent Respimat 20-100 MCG/ACT Aers respimat Generic drug: Ipratropium-Albuterol Inhale 1 puff into the lungs every 6 (six) hours.   ipratropium-albuterol 0.5-2.5 (3) MG/3ML Soln Commonly known as: DUONEB Take 3  mLs by nebulization 2 (two) times daily. Mix with budesonide   enoxaparin 40 MG/0.4ML injection Commonly known as: LOVENOX Inject 0.4 mLs (40 mg total) into the skin daily for 14 days.   fluticasone 50 MCG/ACT nasal spray Commonly known as: FLONASE Place into both nostrils as needed.   fluticasone-salmeterol 115-21 MCG/ACT inhaler Commonly known as: ADVAIR HFA Inhale 2 puffs into the lungs 2 (two) times daily.   losartan 100 MG tablet Commonly known as: COZAAR Take 100 mg by mouth daily.   oxybutynin 5 MG tablet Commonly known as: DITROPAN Take 5 mg by mouth at bedtime.   oxyCODONE 5 MG immediate release tablet Commonly known as: Oxy IR/ROXICODONE Take 1 tablet (5 mg total) by mouth every 4 (four) hours as needed for severe pain.   potassium chloride SA 20 MEQ tablet Commonly known as: KLOR-CON M Take 1 tablet by mouth daily.   pravastatin 10 MG tablet Commonly known as: PRAVACHOL Take 10  mg by mouth daily.   PRESERVISION AREDS 2+MULTI VIT PO Take 2 tablets by mouth.   sertraline 50 MG tablet Commonly known as: ZOLOFT Take 50 mg by mouth daily.   traMADol 50 MG tablet Commonly known as: ULTRAM Take 1 tablet (50 mg total) by mouth every 4 (four) hours as needed for moderate pain.   Vitamin D3 1.25 MG (50000 UT) Caps Take by mouth.   ZINC PO Take by mouth.               Durable Medical Equipment  (From admission, onward)           Start     Ordered   11/10/21 1258  DME Walker rolling  Once       Question:  Patient needs a walker to treat with the following condition  Answer:  S/P total hip arthroplasty   11/10/21 1257   11/10/21 1258  DME Bedside commode  Once       Question:  Patient needs a bedside commode to treat with the following condition  Answer:  S/P total hip arthroplasty   11/10/21 1257            Disposition: Home with home health PT     Follow-up Information     Hooten, Laurice Record, MD Follow up on 12/28/2021.   Specialty: Orthopedic Surgery Why: at 9:30am Contact information: [place] [address] [place], PA-C 11/11/2021, 11:55 AM

## 2021-11-11 NOTE — Progress Notes (Signed)
D/C orders received, IV taken out, AVS explained to pt.  Two honeycomb dressings sent with pt.  Pt taken to hospital entrance to go home with husband.

## 2021-11-11 NOTE — Progress Notes (Addendum)
Physical Therapy Treatment Patient Details Name: Taylor Chambers MRN: [id number] DOB: 1945-08-29 Today's Date: 11/11/2021   History of Present Illness Pt is a 76 y/o F s/p R THA w/ posterior approach 11/10/21. PMH COPD, HTN, GERD, osteoporosis, DOE, and bipolar disorder.    PT Comments    Pt was pleasant and motivated to participate during the session and put forth good effort throughout. Pt was able to ambulate around nurses station using RW  w/ CGA, provided but not needed, and as session progressed was able to evenly bear weight through BLE with more fluid step through pattern. Pt is able to navigate stairs safely using bilat handrails w/ good stability and no LOB w/ min verbal cuing for sequencing. Pt was able to safely maintain hip precautions through all mobility tasks w/ min verbal cuing. Pt was left in recliner at end of session with all needs met. Pt will benefit from HHPT upon discharge to safely address deficits listed in patient problem list for decreased caregiver assistance and eventual return to PLOF.    Recommendations for follow up therapy are one component of a multi-disciplinary discharge planning process, led by the attending physician.  Recommendations may be updated based on patient status, additional functional criteria and insurance authorization.  Follow Up Recommendations  Home health PT     Assistance Recommended at Discharge Intermittent Supervision/Assistance  Patient can return home with the following A little help with walking and/or transfers;A little help with bathing/dressing/bathroom;Assistance with cooking/housework;Help with stairs or ramp for entrance   Equipment Recommendations  None recommended by PT    Recommendations for Other Services       Precautions / Restrictions Precautions Precautions: Posterior Hip;Fall Precaution Booklet Issued: Yes (comment) Restrictions Weight Bearing Restrictions: Yes RLE Weight Bearing: Weight bearing as tolerated      Mobility  Bed Mobility Overal bed mobility: Independent Bed Mobility: Supine to Sit     Supine to sit: Supervision     General bed mobility comments: pt able to maintain hip pre cautions    Transfers Overall transfer level: Needs assist Equipment used: Rolling walker (2 wheels) Transfers: Sit to/from Stand Sit to Stand: Supervision           General transfer comment: min verbal cuing for RLE to maintain hip precuation    Ambulation/Gait Ambulation/Gait assistance: Min guard Gait Distance (Feet): 160 Feet Assistive device: Rolling walker (2 wheels) Gait Pattern/deviations: Decreased step length - right, Decreased step length - left, Step-through pattern       General Gait Details: pt able to evenly bear weight with good stability and balance. Min cuing to push and not lift walker as well min cuing for turning to maintain hip precaution   Stairs Stairs: Yes Stairs assistance: Min guard Stair Management: Two rails, Step to pattern Number of Stairs: 4 General stair comments: pt with good stability and no LOB   Wheelchair Mobility    Modified Rankin (Stroke Patients Only)       Balance Overall balance assessment: Modified Independent Sitting-balance support: Feet supported, Bilateral upper extremity supported Sitting balance-Leahy Scale: Good     Standing balance support: Bilateral upper extremity supported, During functional activity Standing balance-Leahy Scale: Good                              Cognition Arousal/Alertness: Awake/alert Behavior During Therapy: WFL for tasks assessed/performed Overall Cognitive Status: Within Functional Limits for tasks assessed  Exercises      General Comments        Pertinent Vitals/Pain Pain Assessment Pain Assessment: 0-10 Pain Score: 0-No pain Pain Intervention(s): Monitored during session, Premedicated before session, Repositioned     Home Living Family/patient expects to be discharged to:: Private residence Living Arrangements: Spouse/significant other (husband) Available Help at Discharge: Family;Available 24 hours/day Type of Home: House Home Access: Stairs to enter   [employer] of Steps: 18 steps in basement entry w/ chair lift   Home Layout: Two level;Able to live on main level with bedroom/bathroom Home Equipment: Standard Walker;Grab bars - tub/shower;Hand held shower head;BSC/3in1;Cane - quad      Prior Function            PT Goals (current goals can now be found in the care plan section) Progress towards PT goals: Progressing toward goals    Frequency    BID      PT Plan Current plan remains appropriate    Co-evaluation              AM-PAC PT "6 Clicks" Mobility   Outcome Measure  Help needed turning from your back to your side while in a flat bed without using bedrails?: A Little Help needed moving from lying on your back to sitting on the side of a flat bed without using bedrails?: A Little Help needed moving to and from a bed to a chair (including a wheelchair)?: A Little Help needed standing up from a chair using your arms (e.g., wheelchair or bedside chair)?: A Little Help needed to walk in hospital room?: A Little Help needed climbing 3-5 steps with a railing? : A Little 6 Click Score: 18    End of Session Equipment Utilized During Treatment: Gait belt Activity Tolerance: Patient tolerated treatment well;No increased pain Patient left: in chair;with call bell/phone within reach;with chair alarm set Nurse Communication: Mobility status;Precautions;Weight bearing status PT Visit Diagnosis: Other abnormalities of gait and mobility (R26.89);Muscle weakness (generalized) (M62.81)     Time: 0865-7846 PT Time Calculation (min) (ACUTE ONLY): 25 min  Charges:  $Gait Training: 8-22 mins $Therapeutic Exercise: 8-22 mins                     Turner Daniels,  SPT  11/11/2021, 12:52 PM

## 2021-11-12 LAB — SURGICAL PATHOLOGY

## 2021-11-13 LAB — ABO/RH: ABO/RH(D): A POS

## 2022-01-11 ENCOUNTER — Other Ambulatory Visit: Payer: Self-pay

## 2022-01-11 ENCOUNTER — Emergency Department: Payer: Medicare HMO

## 2022-01-11 ENCOUNTER — Observation Stay
Admission: EM | Admit: 2022-01-11 | Discharge: 2022-01-12 | Disposition: A | Discharge status: Home Health | Payer: Medicare HMO | Attending: Family Medicine | Admitting: Family Medicine

## 2022-01-11 ENCOUNTER — Encounter: Payer: Self-pay | Admitting: Emergency Medicine

## 2022-01-11 DIAGNOSIS — Z79899 Other long term (current) drug therapy: Secondary | ICD-10-CM | POA: Insufficient documentation

## 2022-01-11 DIAGNOSIS — Z20822 Contact with and (suspected) exposure to covid-19: Secondary | ICD-10-CM | POA: Insufficient documentation

## 2022-01-11 DIAGNOSIS — Z7951 Long term (current) use of inhaled steroids: Secondary | ICD-10-CM | POA: Insufficient documentation

## 2022-01-11 DIAGNOSIS — A419 Sepsis, unspecified organism: Secondary | ICD-10-CM | POA: Diagnosis not present

## 2022-01-11 DIAGNOSIS — J9601 Acute respiratory failure with hypoxia: Secondary | ICD-10-CM | POA: Insufficient documentation

## 2022-01-11 DIAGNOSIS — F17210 Nicotine dependence, cigarettes, uncomplicated: Secondary | ICD-10-CM | POA: Diagnosis not present

## 2022-01-11 DIAGNOSIS — R778 Other specified abnormalities of plasma proteins: Secondary | ICD-10-CM | POA: Diagnosis not present

## 2022-01-11 DIAGNOSIS — J441 Chronic obstructive pulmonary disease with (acute) exacerbation: Secondary | ICD-10-CM | POA: Diagnosis not present

## 2022-01-11 DIAGNOSIS — E440 Moderate protein-calorie malnutrition: Secondary | ICD-10-CM | POA: Insufficient documentation

## 2022-01-11 DIAGNOSIS — I10 Essential (primary) hypertension: Secondary | ICD-10-CM | POA: Diagnosis not present

## 2022-01-11 DIAGNOSIS — Z7901 Long term (current) use of anticoagulants: Secondary | ICD-10-CM | POA: Diagnosis not present

## 2022-01-11 DIAGNOSIS — Z96641 Presence of right artificial hip joint: Secondary | ICD-10-CM | POA: Insufficient documentation

## 2022-01-11 DIAGNOSIS — R6520 Severe sepsis without septic shock: Secondary | ICD-10-CM | POA: Diagnosis not present

## 2022-01-11 DIAGNOSIS — I5A Non-ischemic myocardial injury (non-traumatic): Secondary | ICD-10-CM | POA: Diagnosis not present

## 2022-01-11 DIAGNOSIS — J9602 Acute respiratory failure with hypercapnia: Secondary | ICD-10-CM

## 2022-01-11 DIAGNOSIS — J432 Centrilobular emphysema: Secondary | ICD-10-CM | POA: Diagnosis present

## 2022-01-11 DIAGNOSIS — R911 Solitary pulmonary nodule: Secondary | ICD-10-CM

## 2022-01-11 DIAGNOSIS — R0602 Shortness of breath: Secondary | ICD-10-CM | POA: Diagnosis present

## 2022-01-11 DIAGNOSIS — E43 Unspecified severe protein-calorie malnutrition: Secondary | ICD-10-CM | POA: Insufficient documentation

## 2022-01-11 LAB — COMPREHENSIVE METABOLIC PANEL
ALT: 24 U/L (ref 0–44)
AST: 37 U/L (ref 15–41)
Albumin: 3.6 g/dL (ref 3.5–5.0)
Alkaline Phosphatase: 127 U/L — ABNORMAL HIGH (ref 38–126)
Anion gap: 12 (ref 5–15)
BUN: 22 mg/dL (ref 8–23)
CO2: 23 mmol/L (ref 22–32)
Calcium: 8.8 mg/dL — ABNORMAL LOW (ref 8.9–10.3)
Chloride: 104 mmol/L (ref 98–111)
Creatinine, Ser: 0.65 mg/dL (ref 0.44–1.00)
GFR, Estimated: >60 mL/min (ref >60)
Glucose, Bld: 175 mg/dL — ABNORMAL HIGH (ref 70–99)
Potassium: 3.5 mmol/L (ref 3.5–5.1)
Sodium: 139 mmol/L (ref 135–145)
Total Bilirubin: 1.2 mg/dL (ref 0.3–1.2)
Total Protein: 7.4 g/dL (ref 6.5–8.1)

## 2022-01-11 LAB — CBC WITH DIFFERENTIAL/PLATELET
Abs Immature Granulocytes: 0.15 10*3/uL — ABNORMAL HIGH (ref 0.00–0.07)
Basophils Absolute: 0.2 10*3/uL — ABNORMAL HIGH (ref 0.0–0.1)
Basophils Relative: 1 %
Eosinophils Absolute: 0 10*3/uL (ref 0.0–0.5)
Eosinophils Relative: 0 %
HCT: 42.5 % (ref 36.0–46.0)
Hemoglobin: 14.2 g/dL (ref 12.0–15.0)
Immature Granulocytes: 1 %
Lymphocytes Relative: 7 %
Lymphs Abs: 1.5 10*3/uL (ref 0.7–4.0)
MCH: 32.3 pg (ref 26.0–34.0)
MCHC: 33.4 g/dL (ref 30.0–36.0)
MCV: 96.6 fL (ref 80.0–100.0)
Monocytes Absolute: 2.4 10*3/uL — ABNORMAL HIGH (ref 0.1–1.0)
Monocytes Relative: 11 %
Neutro Abs: 17.4 10*3/uL — ABNORMAL HIGH (ref 1.7–7.7)
Neutrophils Relative %: 80 %
Platelets: 390 10*3/uL (ref 150–400)
RBC: 4.4 MIL/uL (ref 3.87–5.11)
RDW: 12.3 % (ref 11.5–15.5)
WBC: 21.7 10*3/uL — ABNORMAL HIGH (ref 4.0–10.5)
nRBC: 0 % (ref 0.0–0.2)

## 2022-01-11 LAB — LACTIC ACID, PLASMA: Lactic Acid, Venous: 2.4 mmol/L (ref 0.5–1.9)

## 2022-01-11 LAB — BRAIN NATRIURETIC PEPTIDE: B Natriuretic Peptide: 94 pg/mL (ref 0.0–100.0)

## 2022-01-11 LAB — TROPONIN I (HIGH SENSITIVITY): Troponin I (High Sensitivity): 25 ng/L — ABNORMAL HIGH (ref <18)

## 2022-01-11 LAB — SARS CORONAVIRUS 2 BY RT PCR: SARS Coronavirus 2 by RT PCR: NEGATIVE

## 2022-01-11 LAB — D-DIMER, QUANTITATIVE: D-Dimer, Quant: 1.95 ug/mL-FEU — ABNORMAL HIGH (ref 0.00–0.50)

## 2022-01-11 MED ORDER — SODIUM CHLORIDE 0.9 % IV SOLN: 2.0000 g | Freq: Once | INTRAVENOUS | Status: DC

## 2022-01-11 MED ORDER — ALBUTEROL SULFATE (2.5 MG/3ML) 0.083% IN NEBU
2.5000 mg | INHALATION_SOLUTION | Freq: Once | RESPIRATORY_TRACT | Status: AC
  Administered 2022-01-11: 2.5 mg via RESPIRATORY_TRACT
  Filled 2022-01-11: qty 3

## 2022-01-11 MED ORDER — ALBUTEROL SULFATE (2.5 MG/3ML) 0.083% IN NEBU: 2.5000 mg | INHALATION_SOLUTION | RESPIRATORY_TRACT | Status: DC | PRN

## 2022-01-11 MED ORDER — IPRATROPIUM-ALBUTEROL 0.5-2.5 (3) MG/3ML IN SOLN
3.0000 mL | Freq: Four times a day (QID) | RESPIRATORY_TRACT | Status: DC
  Administered 2022-01-12 (×3): 3 mL via RESPIRATORY_TRACT
  Filled 2022-01-11 (×3): qty 3

## 2022-01-11 MED ORDER — VANCOMYCIN HCL IN DEXTROSE 1-5 GM/200ML-% IV SOLN
1000.0000 mg | Freq: Once | INTRAVENOUS | Status: AC
Start: 2022-01-11 — End: 2022-01-12
  Administered 2022-01-11: 1000 mg via INTRAVENOUS
  Filled 2022-01-11: qty 200

## 2022-01-11 MED ORDER — METHYLPREDNISOLONE SODIUM SUCC 40 MG IJ SOLR
40.0000 mg | Freq: Two times a day (BID) | INTRAMUSCULAR | Status: DC
  Administered 2022-01-12: 40 mg via INTRAVENOUS
  Filled 2022-01-11: qty 1

## 2022-01-11 MED ORDER — ENOXAPARIN SODIUM 40 MG/0.4ML IJ SOSY
40.0000 mg | PREFILLED_SYRINGE | INTRAMUSCULAR | Status: DC
Start: 2022-01-12 — End: 2022-01-12
  Administered 2022-01-12: 40 mg via SUBCUTANEOUS
  Filled 2022-01-11: qty 0.4

## 2022-01-11 MED ORDER — SODIUM CHLORIDE 0.9 % IV SOLN
500.0000 mg | INTRAVENOUS | Status: DC
  Administered 2022-01-12: 500 mg via INTRAVENOUS
  Filled 2022-01-11: qty 5

## 2022-01-11 MED ORDER — SODIUM CHLORIDE 0.9 % IV SOLN: 500.0000 mg | Freq: Once | INTRAVENOUS | Status: DC

## 2022-01-11 MED ORDER — IPRATROPIUM-ALBUTEROL 0.5-2.5 (3) MG/3ML IN SOLN
3.0000 mL | Freq: Once | RESPIRATORY_TRACT | Status: AC
  Administered 2022-01-11: 3 mL via RESPIRATORY_TRACT
  Filled 2022-01-11: qty 3

## 2022-01-11 MED ORDER — SODIUM CHLORIDE 0.9 % IV SOLN
2.0000 g | INTRAVENOUS | Status: DC
  Administered 2022-01-12: 2 g via INTRAVENOUS
  Filled 2022-01-11: qty 20

## 2022-01-11 MED ORDER — ACETAMINOPHEN 650 MG RE SUPP: 650.0000 mg | Freq: Four times a day (QID) | RECTAL | Status: DC | PRN

## 2022-01-11 MED ORDER — ACETAMINOPHEN 325 MG PO TABS: 650.0000 mg | ORAL_TABLET | Freq: Four times a day (QID) | ORAL | Status: DC | PRN

## 2022-01-11 MED ORDER — IOHEXOL 350 MG/ML SOLN
75.0000 mL | Freq: Once | INTRAVENOUS | Status: AC | PRN
  Administered 2022-01-11: 75 mL via INTRAVENOUS

## 2022-01-11 MED ORDER — METHYLPREDNISOLONE SODIUM SUCC 125 MG IJ SOLR
125.0000 mg | Freq: Once | INTRAMUSCULAR | Status: AC
  Administered 2022-01-11: 125 mg via INTRAVENOUS
  Filled 2022-01-11: qty 2

## 2022-01-11 MED ORDER — CEFEPIME HCL 2 G IV SOLR
2.0000 g | Freq: Once | INTRAVENOUS | Status: AC
  Administered 2022-01-11: 2 g via INTRAVENOUS
  Filled 2022-01-11: qty 12.5

## 2022-01-11 MED ORDER — ONDANSETRON HCL 4 MG PO TABS: 4.0000 mg | ORAL_TABLET | Freq: Four times a day (QID) | ORAL | Status: DC | PRN

## 2022-01-11 MED ORDER — LACTATED RINGERS IV SOLN: INTRAVENOUS | Status: DC

## 2022-01-11 MED ORDER — HYDROCODONE-ACETAMINOPHEN 5-325 MG PO TABS
1.0000 | ORAL_TABLET | ORAL | Status: DC | PRN
  Administered 2022-01-12: 2 via ORAL
  Filled 2022-01-11: qty 2

## 2022-01-11 MED ORDER — PREDNISONE 20 MG PO TABS: 40.0000 mg | ORAL_TABLET | Freq: Every day | ORAL | Status: DC

## 2022-01-11 MED ORDER — SODIUM CHLORIDE 0.9 % IV BOLUS
1000.0000 mL | Freq: Once | INTRAVENOUS | Status: AC
  Administered 2022-01-11: 1000 mL via INTRAVENOUS

## 2022-01-11 MED ORDER — ONDANSETRON HCL 4 MG/2ML IJ SOLN: 4.0000 mg | Freq: Four times a day (QID) | INTRAMUSCULAR | Status: DC | PRN

## 2022-01-11 NOTE — Assessment & Plan Note (Addendum)
Acute respiratory failure with hypoxia Sepsis Patient with increased work of breathing tachypneic to 39 using accessory muscles requiring O2 Sepsis criteria includes leukocytosis lactic acidosis, tachypnea, tachycardia Sepsis fluids Antibiotic coverage with ceftriaxone and azithromycin (received cefepime and vancomycin in the ED) Follow blood cultures and sputum cultures DuoNebs every 6 and as needed IV steroids Flutter valve, pulmonary toilet, antitussives and supportive care

## 2022-01-11 NOTE — Assessment & Plan Note (Signed)
-   Continue home meds °

## 2022-01-11 NOTE — ED Provider Notes (Signed)
[facility] Provider Note    Event Date/Time   First MD Initiated Contact with Patient 01/11/22 2125     (approximate)   History   Nasal Congestion   HPI  Angelo Caroll is a 76 y.o. female  with PMHx HTN, COPD/emphysema, here with nasal congestion and SOB. Pt reports that for the past 4 days she has had nasal congestion, cough, wheezing, and fatigue. She has felt chills. She has a h/o COPD and has been using breathing tx without significant relief. She got more SOB today so presents for evaluation. No other complaints. No known sick contacts. No leg swelling.     Physical Exam   Triage Vital Signs: ED Triage Vitals  Enc Vitals Group     BP 01/11/22 2115 (!) 143/58     Pulse Rate 01/11/22 2115 (!) 106     Resp 01/11/22 2115 18     Temp 01/11/22 2115 97.8 F (36.6 C)     Temp Source 01/11/22 2115 Oral     SpO2 01/11/22 2115 94 %     Weight 01/11/22 2115 105 lb (47.6 kg)     Height 01/11/22 2115 '5\' 2"'$  (1.575 m)     Head Circumference --      Peak Flow --      Pain Score 01/11/22 2116 0     Pain Loc --      Pain Edu? --      Excl. in [place]? --     Most recent vital signs: Vitals:   01/12/22 0105 01/12/22 0106  BP: 138/61   Pulse: 99 (!) 103  Resp: (!) 25 (!) 24  Temp: 98.3 F (36.8 C)   SpO2: 99% 99%     General: Awake, mild resp distress with tachypnea. CV:  Good peripheral perfusion. Tachycardic.  Resp:  Increased WOB with diffuse diminished aeration, wheezing. Abd:  No distention. No tenderness. Other:  No significant edema.   ED Results / Procedures / Treatments   Labs (all labs ordered are listed, but only abnormal results are displayed) Labs Reviewed  CBC WITH DIFFERENTIAL/PLATELET - Abnormal; Notable for the following components:      Result Value   WBC 21.7 (*)    Neutro Abs 17.4 (*)    Monocytes Absolute 2.4 (*)    Basophils Absolute 0.2 (*)    Abs Immature Granulocytes 0.15 (*)    All other components within normal  limits  COMPREHENSIVE METABOLIC PANEL - Abnormal; Notable for the following components:   Glucose, Bld 175 (*)    Calcium 8.8 (*)    Alkaline Phosphatase 127 (*)    All other components within normal limits  D-DIMER, QUANTITATIVE - Abnormal; Notable for the following components:   D-Dimer, Quant 1.95 (*)    All other components within normal limits  LACTIC ACID, PLASMA - Abnormal; Notable for the following components:   Lactic Acid, Venous 2.4 (*)    All other components within normal limits  TROPONIN I (HIGH SENSITIVITY) - Abnormal; Notable for the following components:   Troponin I (High Sensitivity) 25 (*)    All other components within normal limits  TROPONIN I (HIGH SENSITIVITY) - Abnormal; Notable for the following components:   Troponin I (High Sensitivity) 148 (*)    All other components within normal limits  SARS CORONAVIRUS 2 BY RT PCR  CULTURE, BLOOD (ROUTINE X 2)  CULTURE, BLOOD (ROUTINE X 2)  EXPECTORATED SPUTUM ASSESSMENT W GRAM STAIN, RFLX TO RESP C  LACTIC ACID, PLASMA  BRAIN NATRIURETIC PEPTIDE  HIV ANTIBODY (ROUTINE TESTING W REFLEX)  PROTIME-INR  CORTISOL-AM, BLOOD  PROCALCITONIN     EKG Sinus tachycardia with PVCs. VR 119, RP 128, QRS 58, QTc 458. ST depression noted in inferior and lateral leads, without ST elevations.   RADIOLOGY CXR: Increased vascular and interstitial prominence CT Angio: Emphysema, no PE   I also independently reviewed and agree with radiologist interpretations.   PROCEDURES:  Critical Care performed: No   MEDICATIONS ORDERED IN ED: Medications  methylPREDNISolone sodium succinate (SOLU-MEDROL) 40 mg/mL injection 40 mg (has no administration in time range)    Followed by  predniSONE (DELTASONE) tablet 40 mg (has no administration in time range)  ipratropium-albuterol (DUONEB) 0.5-2.5 (3) MG/3ML nebulizer solution 3 mL (3 mLs Nebulization Given 01/12/22 0106)  albuterol (PROVENTIL) (2.5 MG/3ML) 0.083% nebulizer solution 2.5  mg (has no administration in time range)  enoxaparin (LOVENOX) injection 40 mg (has no administration in time range)  lactated ringers infusion ( Intravenous New Bag/Given 01/12/22 0111)  cefTRIAXone (ROCEPHIN) 2 g in sodium chloride 0.9 % 100 mL IVPB (has no administration in time range)  azithromycin (ZITHROMAX) 500 mg in sodium chloride 0.9 % 250 mL IVPB (has no administration in time range)  acetaminophen (TYLENOL) tablet 650 mg (has no administration in time range)    Or  acetaminophen (TYLENOL) suppository 650 mg (has no administration in time range)  ondansetron (ZOFRAN) tablet 4 mg (has no administration in time range)    Or  ondansetron (ZOFRAN) injection 4 mg (has no administration in time range)  HYDROcodone-acetaminophen (NORCO/VICODIN) 5-325 MG per tablet 1-2 tablet (has no administration in time range)  guaiFENesin (MUCINEX) 12 hr tablet 1,200 mg (1,200 mg Oral Given 01/12/22 0032)  benzonatate (TESSALON) capsule 200 mg (200 mg Oral Given 01/12/22 0032)  ipratropium-albuterol (DUONEB) 0.5-2.5 (3) MG/3ML nebulizer solution 3 mL (3 mLs Nebulization Given 01/11/22 2121)  sodium chloride 0.9 % bolus 1,000 mL (0 mLs Intravenous Stopped 01/12/22 0024)  methylPREDNISolone sodium succinate (SOLU-MEDROL) 125 mg/2 mL injection 125 mg (125 mg Intravenous Given 01/11/22 2218)  ipratropium-albuterol (DUONEB) 0.5-2.5 (3) MG/3ML nebulizer solution 3 mL (3 mLs Nebulization Given 01/11/22 2334)  ipratropium-albuterol (DUONEB) 0.5-2.5 (3) MG/3ML nebulizer solution 3 mL (3 mLs Nebulization Given 01/11/22 2231)  albuterol (PROVENTIL) (2.5 MG/3ML) 0.083% nebulizer solution 2.5 mg (2.5 mg Nebulization Given 01/11/22 2218)  iohexol (OMNIPAQUE) 350 MG/ML injection 75 mL (75 mLs Intravenous Contrast Given 01/11/22 2209)  ceFEPIme (MAXIPIME) 2 g in sodium chloride 0.9 % 100 mL IVPB (0 g Intravenous Stopped 01/11/22 2334)  vancomycin (VANCOCIN) IVPB 1000 mg/200 mL premix (1,000 mg Intravenous New Bag/Given 01/11/22 2336)      IMPRESSION / MDM / ASSESSMENT AND PLAN / ED COURSE  I reviewed the triage vital signs and the nursing notes.                               The patient is on the cardiac monitor to evaluate for evidence of arrhythmia and/or significant heart rate changes.   Ddx:  Differential includes the following, with pertinent life- or limb-threatening emergencies considered:  COPD exacerbation, PNA, PE, CHF, ACS, PTX, anemia  Patient's presentation is most consistent with acute presentation with potential threat to life or bodily function.  MDM:  76 yo F here with SOB, hypoxia. Suspect COPD exacerbation versus PNA, with diffuse wheezing/tachypnea on exam. Initial WBC 21.7 with left shift and LA slightly  elevated, so pt started on empiric ABX and fluids. CXR clear, however. CMP unremarkable. D-Dimer positive, angio pending. Trop 25, and EKG does show ST depressions likely related to demand. She has no current CP, and no STE are noted. Will monitor closely.   Will admit for acute hypoxic resp failure likely 2/2 COPD vs PNA, with plan to trend trops for ACS, and CT Angio for +D-Dimer. Hospitalist Dr. Damita Dunnings aware and in agreement.  MEDICATIONS GIVEN IN ED: Medications  methylPREDNISolone sodium succinate (SOLU-MEDROL) 40 mg/mL injection 40 mg (has no administration in time range)    Followed by  predniSONE (DELTASONE) tablet 40 mg (has no administration in time range)  ipratropium-albuterol (DUONEB) 0.5-2.5 (3) MG/3ML nebulizer solution 3 mL (3 mLs Nebulization Given 01/12/22 0106)  albuterol (PROVENTIL) (2.5 MG/3ML) 0.083% nebulizer solution 2.5 mg (has no administration in time range)  enoxaparin (LOVENOX) injection 40 mg (has no administration in time range)  lactated ringers infusion ( Intravenous New Bag/Given 01/12/22 0111)  cefTRIAXone (ROCEPHIN) 2 g in sodium chloride 0.9 % 100 mL IVPB (has no administration in time range)  azithromycin (ZITHROMAX) 500 mg in sodium chloride 0.9 % 250 mL  IVPB (has no administration in time range)  acetaminophen (TYLENOL) tablet 650 mg (has no administration in time range)    Or  acetaminophen (TYLENOL) suppository 650 mg (has no administration in time range)  ondansetron (ZOFRAN) tablet 4 mg (has no administration in time range)    Or  ondansetron (ZOFRAN) injection 4 mg (has no administration in time range)  HYDROcodone-acetaminophen (NORCO/VICODIN) 5-325 MG per tablet 1-2 tablet (has no administration in time range)  guaiFENesin (MUCINEX) 12 hr tablet 1,200 mg (1,200 mg Oral Given 01/12/22 0032)  benzonatate (TESSALON) capsule 200 mg (200 mg Oral Given 01/12/22 0032)  ipratropium-albuterol (DUONEB) 0.5-2.5 (3) MG/3ML nebulizer solution 3 mL (3 mLs Nebulization Given 01/11/22 2121)  sodium chloride 0.9 % bolus 1,000 mL (0 mLs Intravenous Stopped 01/12/22 0024)  methylPREDNISolone sodium succinate (SOLU-MEDROL) 125 mg/2 mL injection 125 mg (125 mg Intravenous Given 01/11/22 2218)  ipratropium-albuterol (DUONEB) 0.5-2.5 (3) MG/3ML nebulizer solution 3 mL (3 mLs Nebulization Given 01/11/22 2334)  ipratropium-albuterol (DUONEB) 0.5-2.5 (3) MG/3ML nebulizer solution 3 mL (3 mLs Nebulization Given 01/11/22 2231)  albuterol (PROVENTIL) (2.5 MG/3ML) 0.083% nebulizer solution 2.5 mg (2.5 mg Nebulization Given 01/11/22 2218)  iohexol (OMNIPAQUE) 350 MG/ML injection 75 mL (75 mLs Intravenous Contrast Given 01/11/22 2209)  ceFEPIme (MAXIPIME) 2 g in sodium chloride 0.9 % 100 mL IVPB (0 g Intravenous Stopped 01/11/22 2334)  vancomycin (VANCOCIN) IVPB 1000 mg/200 mL premix (1,000 mg Intravenous New Bag/Given 01/11/22 2336)     Consults:  Hospitalist Dr. Damita Dunnings   EMR reviewed       FINAL CLINICAL IMPRESSION(S) / ED DIAGNOSES   Final diagnoses:  Acute respiratory failure with hypoxia ([place])  COPD exacerbation ([place])  Elevated troponin     Rx / DC Orders   ED Discharge Orders     None        Note:  This document was prepared using Dragon voice recognition  software and may include unintentional dictation errors.   Duffy Bruce, MD 01/12/22 [phone]

## 2022-01-11 NOTE — ED Triage Notes (Signed)
Pt presents via POV with complaints of nasal congestion, cough, and headache for the last 4 days. Unsure if she's been around anyone sick but developed a "cold" over the last few days. Hx of COPD & Emphysema. Denies fevers, N/V, or CP.

## 2022-01-11 NOTE — Assessment & Plan Note (Signed)
Pain control and fall precautions

## 2022-01-11 NOTE — Progress Notes (Signed)
PHARMACY -  BRIEF ANTIBIOTIC NOTE   Pharmacy has received consult(s) for Cefepime & Vancomycin from an ED provider.  The patient's profile has been reviewed for ht/wt/allergies/indication/available labs.    One time order(s) placed for Cefepime 2 gm & Vancomycin 1 gm per pt wt 47.6 kg  Further antibiotics/pharmacy consults should be ordered by admitting physician if indicated.                       Thank you, Renda Rolls, PharmD, [facility] 01/11/2022 10:12 PM

## 2022-01-11 NOTE — ED Notes (Signed)
ERP notified of critical lab value for lactic.

## 2022-01-11 NOTE — H&P (Incomplete)
History and Physical    Patient: Taylor Chambers FYB:[id number] DOB: [birth year] DOA: 01/11/2022 DOS: the patient was seen and examined on 01/11/2022 PCP: Leonel Ramsay, MD  Patient coming from: Home  Chief Complaint:  Chief Complaint  Patient presents with   Nasal Congestion    HPI: Taylor Chambers is a 76 y.o. female with medical history significant for Hypertension, COPD/emphysema who is s/p right hip arthroplasty in June 2023 who presents to the ED with a 4-day history of cough, nasal congestion, wheezing, generalized malaise and chills.  She has been using her breathing treatments without significant relief.  Her symptoms became acutely worse on the day of arrival prompting the visit to the ED. ED course and data review: Afebrile on arrival with pulse up to 119, respiratory rate up to 36, BP 143/58, O2 sat 94% on room air but with increased work of breathing she was placed on O2 at 4 L improving her respiratory rate to 21 and O2 sat are 100%. Labs WBC 21,000 with lactic acid 2.4.  Troponin 25 and BNP 94.  CBC and CMP otherwise unremarkable except for glucose of 175.  D-dimer 1.95 EKG, personally viewed and interpreted: Sinus tachycardia at 119 with T wave inversion inferior leads CTA chest PE protocol shows the following: IMPRESSION: 1. No evidence of pulmonary embolism or other acute cardiopulmonary disease. 2. Moderate severity centrilobular and paraseptal emphysematous lung disease. 3. Stable right lower lobe noncalcified lung nodules. ...... 4. Small hiatal hernia.    Patient treated with a fluid bolus, several rounds of DuoNebs, cefepime and vancomycin and methylprednisolone.  Hospitalist consulted for admission.     Past Medical History:  Diagnosis Date   Allergies    Anxiety    Aortic atherosclerosis (HCC)    Bipolar affective disorder (HCC)    COPD (chronic obstructive pulmonary disease) (HCC)    Coronary artery calcification seen on CT scan    Depression    Diastolic  dysfunction 52/77/8242   a.) TTE 02/24/2020: EF >55%; triv PR, mild TR, mod MR; G1DD.   DOE (dyspnea on exertion)    Emphysema lung (HCC)    History of cataract    HLD (hyperlipidemia)    Hypertension    Macular degeneration    Migraines    Osteoporosis    Pneumonia    Tobacco use    Past Surgical History:  Procedure Laterality Date   CATARACT EXTRACTION     COLONOSCOPY N/A 08/30/2021   Procedure: COLONOSCOPY;  Surgeon: Annamaria Helling, DO;  Location: [facility] ENDOSCOPY;  Service: Gastroenterology;  Laterality: N/A;   DILATION AND CURETTAGE OF UTERUS     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   Procedure: TOTAL HIP ARTHROPLASTY;  Surgeon: Dereck Leep, MD;  Location: [facility] ORS;  Service: Orthopedics;  Laterality: Right;   WISDOM TOOTH EXTRACTION     WRIST GANGLION EXCISION     Social History:  reports that she has been smoking cigarettes. She has a 12.50 pack-year smoking history. She has never used smokeless tobacco. She reports that she does not drink alcohol and does not use drugs.  Allergies  Allergen Reactions   Bupropion     Family History  Problem Relation Age of Onset   Breast cancer Mother 88    Prior to Admission medications   Medication Sig Start Date End Date Taking? Authorizing Provider  alendronate (FOSAMAX) 70 MG tablet Take 70 mg by mouth once a week. Take with a full glass of water on an empty stomach.  Patient not taking: Reported on 10/28/2021    [provider]  amLODipine (NORVASC) 5 MG tablet Take 5 mg by mouth daily.    [provider]  budesonide (PULMICORT) 0.25 MG/2ML nebulizer solution Take 0.25 mg by nebulization 2 (two) times daily.    [provider]  buPROPion (WELLBUTRIN) 75 MG tablet Take 75 mg by mouth 2 (two) times daily. Patient not taking: Reported on 10/28/2021    [provider]  Calcium Carbonate (CALCIUM 500 PO) Take by mouth.    [provider]  celecoxib (CELEBREX) 200 MG capsule Take 1  capsule (200 mg total) by mouth 2 (two) times daily. 11/11/21   Fausto Skillern, PA-C  Cholecalciferol (VITAMIN D3) 1.25 MG (50000 UT) CAPS Take by mouth.    [provider]  enoxaparin (LOVENOX) 40 MG/0.4ML injection Inject 0.4 mLs (40 mg total) into the skin daily for 14 days. 11/11/21 11/25/21  Tamala Julian B, PA-C  fluticasone (FLONASE) 50 MCG/ACT nasal spray Place into both nostrils as needed.    [provider]  fluticasone-salmeterol (ADVAIR HFA) 115-21 MCG/ACT inhaler Inhale 2 puffs into the lungs 2 (two) times daily. Patient not taking: Reported on 10/28/2021    [provider]  Ipratropium-Albuterol (COMBIVENT RESPIMAT) 20-100 MCG/ACT AERS respimat Inhale 1 puff into the lungs every 6 (six) hours.    [provider]  ipratropium-albuterol (DUONEB) 0.5-2.5 (3) MG/3ML SOLN Take 3 mLs by nebulization 2 (two) times daily. Mix with budesonide    [provider]  losartan (COZAAR) 100 MG tablet Take 100 mg by mouth daily.    [provider]  Multiple Vitamins-Minerals (PRESERVISION AREDS 2+MULTI VIT PO) Take 2 tablets by mouth.    [provider]  Multiple Vitamins-Minerals (ZINC PO) Take by mouth. Patient not taking: Reported on 10/28/2021    [provider]  oxybutynin (DITROPAN) 5 MG tablet Take 5 mg by mouth at bedtime. Patient not taking: Reported on 10/28/2021    [provider]  oxyCODONE (OXY IR/ROXICODONE) 5 MG immediate release tablet Take 1 tablet (5 mg total) by mouth every 4 (four) hours as needed for severe pain. 11/11/21   Tamala Julian B, PA-C  potassium chloride SA (KLOR-CON M) 20 MEQ tablet Take 1 tablet by mouth daily. 11/08/21   [provider]  pravastatin (PRAVACHOL) 10 MG tablet Take 10 mg by mouth daily.    [provider]  sertraline (ZOLOFT) 50 MG tablet Take 50 mg by mouth daily. Patient not taking: Reported on 10/28/2021    [provider]  traMADol (ULTRAM) 50 MG  tablet Take 1 tablet (50 mg total) by mouth every 4 (four) hours as needed for moderate pain. 11/11/21   Fausto Skillern, PA-C    Physical Exam: Vitals:   01/11/22 2200 01/11/22 2230 01/11/22 2300 01/11/22 2330  BP: (!) 138/52 (!) 128/54 125/73 121/67  Pulse: (!) 109 (!) 107 (!) 105 (!) 104  Resp: (!) 36 (!) 29 (!) 21 (!) 47  Temp:    97.8 F (36.6 C)  TempSrc:    Oral  SpO2: 100% 100% 100% 100%  Weight:      Height:       Physical Exam  Labs on Admission: I have personally reviewed following labs and imaging studies  CBC: Recent Labs  Lab 01/11/22 2121  WBC 21.7*  NEUTROABS 17.4*  HGB 14.2  HCT 42.5  MCV 96.6  PLT 166   Basic Metabolic Panel: Recent Labs  Lab 01/11/22  2121  NA 139  K 3.5  CL 104  CO2 23  GLUCOSE 175*  BUN 22  CREATININE 0.65  CALCIUM 8.8*   GFR: Estimated Creatinine Clearance: 46.4 mL/min (by C-G formula based on SCr of 0.65 mg/dL). Liver Function Tests: Recent Labs  Lab 01/11/22 2121  AST 37  ALT 24  ALKPHOS 127*  BILITOT 1.2  PROT 7.4  ALBUMIN 3.6   No results for input(s): "LIPASE", "AMYLASE" in the last 168 hours. No results for input(s): "AMMONIA" in the last 168 hours. Coagulation Profile: No results for input(s): "INR", "PROTIME" in the last 168 hours. Cardiac Enzymes: No results for input(s): "CKTOTAL", "CKMB", "CKMBINDEX", "TROPONINI" in the last 168 hours. BNP (last 3 results) No results for input(s): "PROBNP" in the last 8760 hours. HbA1C: No results for input(s): "HGBA1C" in the last 72 hours. CBG: No results for input(s): "GLUCAP" in the last 168 hours. Lipid Profile: No results for input(s): "CHOL", "HDL", "LDLCALC", "TRIG", "CHOLHDL", "LDLDIRECT" in the last 72 hours. Thyroid Function Tests: No results for input(s): "TSH", "T4TOTAL", "FREET4", "T3FREE", "THYROIDAB" in the last 72 hours. Anemia Panel: No results for input(s): "VITAMINB12", "FOLATE", "FERRITIN", "TIBC", "IRON", "RETICCTPCT" in the last 72  hours. Urine analysis:    Component Value Date/Time   COLORURINE YELLOW (A) 10/28/2021 1425   APPEARANCEUR HAZY (A) 10/28/2021 1425   LABSPEC 1.026 10/28/2021 1425   PHURINE 5.0 10/28/2021 1425   GLUCOSEU NEGATIVE 10/28/2021 1425   HGBUR NEGATIVE 10/28/2021 1425   BILIRUBINUR NEGATIVE 10/28/2021 1425   KETONESUR 5 (A) 10/28/2021 1425   PROTEINUR 30 (A) 10/28/2021 1425   NITRITE NEGATIVE 10/28/2021 1425   LEUKOCYTESUR NEGATIVE 10/28/2021 1425    Radiological Exams on Admission: CT Angio Chest PE W and/or Wo Contrast  Result Date: 01/11/2022 CLINICAL DATA:  Nasal congestion, cough and headache. EXAM: CT ANGIOGRAPHY CHEST WITH CONTRAST TECHNIQUE: Multidetector CT imaging of the chest was performed using the standard protocol during bolus administration of intravenous contrast. Multiplanar CT image reconstructions and MIPs were obtained to evaluate the vascular anatomy. RADIATION DOSE REDUCTION: This exam was performed according to the departmental dose-optimization program which includes automated exposure control, adjustment of the mA and/or kV according to patient size and/or use of iterative reconstruction technique. CONTRAST:  40m OMNIPAQUE IOHEXOL 350 MG/ML SOLN COMPARISON:  July 16, 2021 FINDINGS: Cardiovascular: There is mild calcification of the aortic arch, without evidence of aortic aneurysm. Satisfactory opacification of the pulmonary arteries to the segmental level. No evidence of pulmonary embolism. Normal heart size. No pericardial effusion. Mediastinum/Nodes: No enlarged mediastinal, hilar, or axillary lymph nodes. Thyroid gland, trachea, and esophagus demonstrate no significant findings. Lungs/Pleura: There is moderate severity centrilobular and paraseptal emphysematous lung disease. A stable 6.5 mm noncalcified lung nodule is seen within the posterior aspect of the right lower lobe (axial CT image 63, CT series 5). An additional stable 8.3 mm noncalcified lung nodule is seen  within the right lower lobe (axial CT image 71, CT series 5). There is mild, posterior biapical scarring and/or atelectasis, with mild atelectasis seen within the left lung base. Focal lingular scarring and/or atelectasis is seen. This is mildly increased in size when compared to the prior study. There is no evidence of acute infiltrate, pleural effusion or pneumothorax. Upper Abdomen: There is a small hiatal hernia. Musculoskeletal: Multilevel degenerative changes seen throughout the thoracic spine. Review of the MIP images confirms the above findings. IMPRESSION: 1. No evidence of pulmonary embolism or other acute cardiopulmonary disease. 2. Moderate severity centrilobular  and paraseptal emphysematous lung disease. 3. Stable right lower lobe noncalcified lung nodules. Additional follow-up chest CT at 18-24 months (from the July 16, 2021 scan) is considered optional for low-risk patients, but is recommended for high-risk patients. This recommendation follows the consensus statement: Guidelines for Management of Incidental Pulmonary Nodules Detected on CT Images: From the Fleischner Society 2017; Radiology 2017; 284:228-243. 4. Small hiatal hernia. Aortic Atherosclerosis (ICD10-I70.0) and Emphysema (ICD10-J43.9). Electronically Signed   By: Virgina Norfolk M.D.   On: 01/11/2022 22:34   DG Chest 1 View  Result Date: 01/11/2022 CLINICAL DATA:  10031. Complains of congestion, cough and headache. History COPD. EXAM: CHEST  1 VIEW COMPARISON:  Lung screening chest CT 07/16/2021, portable chest 08/02/2015 FINDINGS: The cardiac size and mediastinal configuration are normal. There is aortic atherosclerosis. There are trace pleural effusions. The central vessels are mildly prominent in relation to prior studies. The lungs are emphysematous. There are diffuse increased interstitial markings which could be due to interstitial edema or interstitial pneumonia. There are underlying scattered areas of ground-glass opacity  which could be ground-glass edema or ground-glass pneumonitis. IMPRESSION: Increased vascular and interstitial prominence, could be due to perihilar vascular congestion and interstitial edema, or interstitial pneumonia with scattered areas of ground-glass edema or pneumonitis. The cardiac size is normal. Trace pleural effusions are also noted. Clinical correlation and radiographic follow-up recommended. Electronically Signed   By: Telford Nab M.D.   On: 01/11/2022 22:06     Data Reviewed: Relevant notes from primary care and specialist visits, past discharge summaries as available in EHR, including Care Everywhere. Prior diagnostic testing as pertinent to current admission diagnoses Updated medications and problem lists for reconciliation ED course, including vitals, labs, imaging, treatment and response to treatment Triage notes, nursing and pharmacy notes and ED provider's notes Notable results as noted in HPI   Assessment and Plan: * COPD with acute exacerbation ([place]) Acute respiratory failure with hypoxia Sepsis Patient with increased work of breathing tachypneic to 39 using accessory muscles requiring O2 Sepsis criteria includes leukocytosis lactic acidosis, tachypnea, tachycardia Sepsis fluids Antibiotic coverage with ceftriaxone and azithromycin (received cefepime and vancomycin in the ED) Follow blood cultures DuoNebs every 6 and as needed IV steroids Flutter valve, pulmonary toilet, antitussives and supportive care  Hx of total hip arthroplasty, 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 Inpatient status is judged to be reasonable and necessary in  order to provide the required intensity of service to ensure the patient's safety. The patient's presenting symptoms, physical exam findings, and initial radiographic and laboratory data in the context of their chronic comorbidities is felt to place them at high risk for further clinical deterioration. Furthermore, it is not anticipated that the patient will be medically stable for discharge from the hospital within 2 midnights of admission.   * I certify that at the point of admission it is my clinical judgment that the patient will require inpatient hospital care spanning beyond 2 midnights from the point of admission due to high intensity of service, high risk for further deterioration and high frequency of surveillance required.*  Author: Athena Masse, MD 01/11/2022 11:47 PM  For on call review [email or web address].

## 2022-01-12 ENCOUNTER — Encounter: Payer: Self-pay | Admitting: Internal Medicine

## 2022-01-12 DIAGNOSIS — E440 Moderate protein-calorie malnutrition: Secondary | ICD-10-CM | POA: Insufficient documentation

## 2022-01-12 DIAGNOSIS — E43 Unspecified severe protein-calorie malnutrition: Secondary | ICD-10-CM | POA: Insufficient documentation

## 2022-01-12 DIAGNOSIS — I5A Non-ischemic myocardial injury (non-traumatic): Secondary | ICD-10-CM

## 2022-01-12 DIAGNOSIS — R911 Solitary pulmonary nodule: Secondary | ICD-10-CM

## 2022-01-12 DIAGNOSIS — J441 Chronic obstructive pulmonary disease with (acute) exacerbation: Secondary | ICD-10-CM | POA: Diagnosis not present

## 2022-01-12 LAB — PROCALCITONIN: Procalcitonin: 0.97 ng/mL

## 2022-01-12 LAB — CORTISOL-AM, BLOOD: Cortisol - AM: 19.3 ug/dL (ref 6.7–22.6)

## 2022-01-12 LAB — LACTIC ACID, PLASMA: Lactic Acid, Venous: 1.8 mmol/L (ref 0.5–1.9)

## 2022-01-12 LAB — HIV ANTIBODY (ROUTINE TESTING W REFLEX): HIV Screen 4th Generation wRfx: NONREACTIVE

## 2022-01-12 LAB — PROTIME-INR
INR: 1.3 — ABNORMAL HIGH (ref 0.8–1.2)
Prothrombin Time: 15.8 seconds — ABNORMAL HIGH (ref 11.4–15.2)

## 2022-01-12 LAB — TROPONIN I (HIGH SENSITIVITY)
Troponin I (High Sensitivity): 148 ng/L (ref <18)
Troponin I (High Sensitivity): 202 ng/L (ref <18)

## 2022-01-12 MED ORDER — ENSURE ENLIVE PO LIQD
237.0000 mL | Freq: Three times a day (TID) | ORAL | Status: DC
  Administered 2022-01-12: 237 mL via ORAL

## 2022-01-12 MED ORDER — BENZONATATE 200 MG PO CAPS: 200.0000 mg | ORAL_CAPSULE | Freq: Three times a day (TID) | ORAL | 0 refills | Status: DC | PRN

## 2022-01-12 MED ORDER — BENZONATATE 100 MG PO CAPS
200.0000 mg | ORAL_CAPSULE | Freq: Three times a day (TID) | ORAL | Status: DC | PRN
Start: 2022-01-12 — End: 2022-01-12
  Administered 2022-01-12: 200 mg via ORAL
  Filled 2022-01-12: qty 2

## 2022-01-12 MED ORDER — PREDNISONE 20 MG PO TABS: 40.0000 mg | ORAL_TABLET | Freq: Every day | ORAL | 0 refills | Status: DC

## 2022-01-12 MED ORDER — ADULT MULTIVITAMIN W/MINERALS CH
1.0000 | ORAL_TABLET | Freq: Every day | ORAL | Status: DC
Start: 2022-01-13 — End: 2022-01-12

## 2022-01-12 MED ORDER — GUAIFENESIN ER 600 MG PO TB12
1200.0000 mg | ORAL_TABLET | Freq: Two times a day (BID) | ORAL | Status: DC
  Administered 2022-01-12 (×2): 1200 mg via ORAL
  Filled 2022-01-12 (×2): qty 2

## 2022-01-12 MED ORDER — AZITHROMYCIN 250 MG PO TABS: ORAL_TABLET | ORAL | 0 refills | Status: AC

## 2022-01-12 NOTE — Evaluation (Signed)
Physical Therapy Evaluation Patient Details Name: Taylor Chambers MRN: [id number] DOB: 1945-11-22 Today's Date: 01/12/2022  History of Present Illness  Pt is a 76 y/o F s/p R THA w/ posterior approach 11/10/21. PMH COPD, HTN, GERD, osteoporosis, DOE, and bipolar disorder.  Clinical Impression  Pt did relatively well with PT exam and was able to ambulate ~350 ft, she did need one rest break at ~125 ft  (O2 stayed in the mid 90s most of the time, HR up to 130 with prolonged activity).  Overall pt did relatively well but did display some consistent stagger stepping and though PT did not need to directly physically assist she did reach to counter/wall/grab bar surfaces from time to time.  Gait much more consistent and w/o stagger stepping with light HHA for the final ~100 ft.  PT encouraged her to start using SPC at home until she has had time to improve function/safety with HHPT.       Recommendations for follow up therapy are one component of a multi-disciplinary discharge planning process, led by the attending physician.  Recommendations may be updated based on patient status, additional functional criteria and insurance authorization.  Follow Up Recommendations Home health PT      Assistance Recommended at Discharge Intermittent Supervision/Assistance  Patient can return home with the following  Assistance with cooking/housework;Help with stairs or ramp for entrance;Assist for transportation    Equipment Recommendations None recommended by PT (discussed her using SPC more consistently)  Recommendations for Other Services       Functional Status Assessment Patient has had a recent decline in their functional status and demonstrates the ability to make significant improvements in function in a reasonable and predictable amount of time.     Precautions / Restrictions Precautions Precautions:  (mod fall) Restrictions Weight Bearing Restrictions: No      Mobility  Bed Mobility Overal bed  mobility: Modified Independent             General bed mobility comments: Pt moves in bed with confidence    Transfers Overall transfer level: Independent Equipment used: None               General transfer comment: Pt was able to rise to standing w/o assist or hesitation    Ambulation/Gait Ambulation/Gait assistance: Supervision Gait Distance (Feet): 350 Feet Assistive device: None, 1 person hand held assist         General Gait Details: Pt showed good effort with ambulation; while walking w/o AD (first 250 ft ) she had multiple small bouts of stagger stepping/wobble that she generally self corrected (often reaching toward surfaces to steady).  She did much better with more consistent cadence and less unsteadiness with HHA  Stairs            Wheelchair Mobility    Modified Rankin (Stroke Patients Only)       Balance Overall balance assessment: Needs assistance Sitting-balance support: No upper extremity supported Sitting balance-Leahy Scale: Normal       Standing balance-Leahy Scale: Fair Standing balance comment: Pt with no overt LOBs, but had multiple stagger stepping episodes with ambulation w/o AD                             Pertinent Vitals/Pain Pain Assessment Pain Assessment: No/denies pain    Home Living Family/patient expects to be discharged to:: Private residence Living Arrangements: Spouse/significant other Available Help at Discharge: Family;Available 24 hours/day Type of  Home: House Home Access: Stairs to enter   [employer] of Steps: 18 steps in basement entry w/ chair lift   Home Layout: Two level;Able to live on main level with bedroom/bathroom Home Equipment: Grab bars - tub/shower;Hand held shower head;BSC/3in1;Cane - quad;Rolling Walker (2 wheels)      Prior Function Prior Level of Function : Independent/Modified Independent;Driving             Mobility Comments: No hx of falls; no AD -  reports she drives and stays active ADLs Comments: Independent w/ ADLs     Hand Dominance        Extremity/Trunk Assessment   Upper Extremity Assessment Upper Extremity Assessment: Overall WFL for tasks assessed    Lower Extremity Assessment Lower Extremity Assessment: Overall WFL for tasks assessed       Communication   Communication: No difficulties  Cognition Arousal/Alertness: Awake/alert Behavior During Therapy: WFL for tasks assessed/performed Overall Cognitive Status: Within Functional Limits for tasks assessed                                          General Comments      Exercises     Assessment/Plan    PT Assessment Patient needs continued PT services  PT Problem List Decreased activity tolerance;Decreased balance;Decreased coordination;Decreased knowledge of use of DME;Decreased safety awareness       PT Treatment Interventions DME instruction;Gait training;Functional mobility training;Therapeutic activities;Therapeutic exercise;Balance training;Patient/family education    PT Goals (Current goals can be found in the Care Plan section)  Acute Rehab PT Goals Patient Stated Goal: go home PT Goal Formulation: With patient Time For Goal Achievement: 01/25/22 Potential to Achieve Goals: Good    Frequency Min 2X/week     Co-evaluation               AM-PAC PT "6 Clicks" Mobility  Outcome Measure Help needed turning from your back to your side while in a flat bed without using bedrails?: A Little Help needed moving from lying on your back to sitting on the side of a flat bed without using bedrails?: A Little Help needed moving to and from a bed to a chair (including a wheelchair)?: A Little Help needed standing up from a chair using your arms (e.g., wheelchair or bedside chair)?: A Little Help needed to walk in hospital room?: A Little Help needed climbing 3-5 steps with a railing? : A Little 6 Click Score: 18    End of  Session Equipment Utilized During Treatment: Gait belt Activity Tolerance: Patient tolerated treatment well;Patient limited by fatigue Patient left: in chair;with call bell/phone within reach Nurse Communication: Mobility status PT Visit Diagnosis: Unsteadiness on feet (R26.81);Difficulty in walking, not elsewhere classified (R26.2)    Time: 0998-3382 PT Time Calculation (min) (ACUTE ONLY): 20 min   Charges:   PT Evaluation $PT Eval Low Complexity: 1 Low PT Treatments $Gait Training: 8-22 mins        Kreg Shropshire, DPT 01/12/2022, 12:47 PM

## 2022-01-12 NOTE — Hospital Course (Signed)
Taylor Chambers is a 76 y.o. female with medical history significant for Hypertension, COPD/emphysema who is s/p right hip arthroplasty in June 2023 who presents to the ED with a 4-day history of cough, nasal congestion, wheezing, generalized malaise and chills.  Cough is productive of copious amount of thick tenacious yellow phlegm.  She has been using her breathing treatments without significant relief.  Her symptoms became acutely worse on the day of arrival prompting the visit to the ED. ED course and data review: Afebrile on arrival with pulse up to 119, respiratory rate up to 36, BP 143/58, O2 sat 94% on room air but with increased work of breathing she was placed on O2 at 4 L improving her respiratory rate to 21 and O2 sat are 100%. Labs WBC 21,000 with lactic acid 2.4.  Troponin 25 and BNP 94.  CBC and CMP otherwise unremarkable except for glucose of 175.  D-dimer 1.95

## 2022-01-12 NOTE — Discharge Summary (Signed)
Physician Discharge Summary   Patient: Taylor Chambers MRN: [id number] DOB: 12/30/1945  Admit date:     01/11/2022  Discharge date: 01/12/22  Discharge Physician: Edwin Dada   PCP: Leonel Ramsay, MD     Recommendations at discharge:  Follow up with PCP Dr. 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 F with emphysema, HTN who presented with several days progressive cough, wheezing, and thick yellow phlegm.  In the ER CT angiogram of the chest showed no infiltrate, pulmonary embolism, but did show extensive emphysema.  She was admitted and started on steroids, antibiotics.     * COPD with acute exacerbation [facility]) Patient admitted and started on antibiotics, steroids.  Imaging ruled out pneumonia, she had no source of infection, suspect her lactic acid elevation was due to lung disease, sepsis ruled out.  No documented hypoxia here, respiratory failure ruled out.    She was weaned to room air, able to ambulate without hypoxia, was taking orals well, ambulating short distances without assistance, and mentating at baseline.  Discharged with 5 days of azithromycin and prednisone, close follow-up with PCP.             The [facility] Controlled Substances Registry was reviewed for this patient prior to discharge.    Procedures performed:  - CTA chest   Disposition: Home Diet recommendation: Regular   DISCHARGE MEDICATION: Allergies as of 01/12/2022       Reactions   Bupropion         Medication List     STOP taking these medications    alendronate 70 MG tablet Commonly known as: FOSAMAX   buPROPion 75 MG tablet Commonly known as:  WELLBUTRIN   enoxaparin 40 MG/0.4ML injection Commonly known as: LOVENOX   fluticasone-salmeterol 115-21 MCG/ACT inhaler Commonly known as: ADVAIR HFA   oxybutynin 5 MG tablet Commonly known as: DITROPAN   sertraline 50 MG tablet Commonly known as: ZOLOFT   ZINC PO       TAKE these medications    amLODipine 5 MG tablet Commonly known as: NORVASC Take 5 mg by mouth daily.   azithromycin 250 MG tablet Commonly known as: Zithromax Z-Pak Take 2 tablets (500 mg) on  Day 1,  followed by 1 tablet (250 mg) once daily on Days 2 through 5.   benzonatate 200 MG capsule Commonly known as: TESSALON Take 1 capsule (200 mg total) by mouth 3 (three) times daily as needed for cough.   budesonide 0.25 MG/2ML nebulizer solution Commonly known as: PULMICORT Take 0.25 mg by nebulization 2 (two) times daily.   CALCIUM 500 PO Take by mouth.   celecoxib 200 MG capsule Commonly known as: CELEBREX Take 1 capsule (200 mg total) by mouth 2 (two) times daily.   Combivent Respimat 20-100 MCG/ACT Aers respimat Generic drug: Ipratropium-Albuterol Inhale 1 puff into the lungs every 6 (six) hours.   ipratropium-albuterol 0.5-2.5 (3) MG/3ML Soln Commonly known as: DUONEB Take 3 mLs by nebulization 2 (two) times daily. Mix with budesonide   fluticasone 50 MCG/ACT nasal spray Commonly known as: FLONASE Place into both nostrils as needed.   losartan 100 MG tablet Commonly known  as: COZAAR Take 100 mg by mouth daily.   oxyCODONE 5 MG immediate release tablet Commonly known as: Oxy IR/ROXICODONE Take 1 tablet (5 mg total) by mouth every 4 (four) hours as needed for severe pain.   potassium chloride SA 20 MEQ tablet Commonly known as: KLOR-CON M Take 1 tablet by mouth daily.   pravastatin 10 MG tablet Commonly known as: PRAVACHOL Take 10 mg by mouth daily.   predniSONE 20 MG tablet Commonly known as: DELTASONE Take 2 tablets (40 mg total) by mouth daily with breakfast. Start taking  on: January 13, 2022   PRESERVISION AREDS 2+MULTI VIT PO Take 2 tablets by mouth.   traMADol 50 MG tablet Commonly known as: ULTRAM Take 1 tablet (50 mg total) by mouth every 4 (four) hours as needed for moderate pain.   Vitamin D3 1.25 MG (50000 UT) Caps Take by mouth.        Follow-up Information     Leonel Ramsay, MD Follow up.   Specialty: Infectious Diseases Why: Follow up with Dr. Ola Spurr in 1 week Contact information: [place] [address] [phone]                 Discharge Instructions     Discharge instructions   Complete by: As directed    From Dr. Loleta Books: You were admitted for a COPD flare You had a CT scan of the lungs that showed no blood clots and no pneumonia, but did show fairly advanced emphysema, which you knew about.  You should take steroids and antibiotics for the next 5 days Take the steroid prednisone 40 mg daily (two tabs) for 5 days Take the antibiotic azithromycin for 5 days (2 tabs or 500 mg on the first day, then 250 mg one tab once daily for the next 4 days)  Go see your primary care doctor, Dr. Ola Spurr in 1 week Call his office for an appointment.  For the next week, take your Duoneb nebulizer four times daily After that, you can go back to using it as you usually did  You can continue your home Trelegy  Ladona Ridgel might help with cough Use the incentive spirometer and flutter valve at home  Continue your other home medicines as before   Increase activity slowly   Complete by: As directed        Discharge Exam: Filed Weights   01/11/22 2115  Weight: 47.6 kg    General: Pt is alert, awake, not in acute distress Cardiovascular: RRR, nl S1-S2, no murmurs appreciated.   No LE edema.   Respiratory: Normal respiratory rate and rhythm.  Diminished air movement, but CTAB without rales or wheezes. Abdominal: Abdomen soft and non-tender.  No distension or HSM.   Neuro/Psych:  Strength symmetric in upper and lower extremities.  Judgment and insight appear normal.   Condition at discharge: fair  The results of significant diagnostics from this hospitalization (including imaging, microbiology, ancillary and laboratory) are listed below for reference.   Imaging Studies: CT Angio Chest PE W and/or Wo Contrast  Result Date: 01/11/2022 CLINICAL DATA:  Nasal congestion, cough and headache. EXAM: CT ANGIOGRAPHY CHEST WITH CONTRAST TECHNIQUE: Multidetector CT imaging of the chest was performed using the standard protocol during bolus administration of intravenous contrast. Multiplanar CT image reconstructions and MIPs were obtained to evaluate the vascular anatomy. RADIATION DOSE REDUCTION: This exam was performed according to the departmental dose-optimization program which includes automated exposure control, adjustment of the mA and/or  kV according to patient size and/or use of iterative reconstruction technique. CONTRAST:  82m OMNIPAQUE IOHEXOL 350 MG/ML SOLN COMPARISON:  July 16, 2021 FINDINGS: Cardiovascular: There is mild calcification of the aortic arch, without evidence of aortic aneurysm. Satisfactory opacification of the pulmonary arteries to the segmental level. No evidence of pulmonary embolism. Normal heart size. No pericardial effusion. Mediastinum/Nodes: No enlarged mediastinal, hilar, or axillary lymph nodes. Thyroid gland, trachea, and esophagus demonstrate no significant findings. Lungs/Pleura: There is moderate severity centrilobular and paraseptal emphysematous lung disease. A stable 6.5 mm noncalcified lung nodule is seen within the posterior aspect of the right lower lobe (axial CT image 63, CT series 5). An additional stable 8.3 mm noncalcified lung nodule is seen within the right lower lobe (axial CT image 71, CT series 5). There is mild, posterior biapical scarring and/or atelectasis, with mild atelectasis seen within the left lung base. Focal lingular  scarring and/or atelectasis is seen. This is mildly increased in size when compared to the prior study. There is no evidence of acute infiltrate, pleural effusion or pneumothorax. Upper Abdomen: There is a small hiatal hernia. Musculoskeletal: Multilevel degenerative changes seen throughout the thoracic spine. Review of the MIP images confirms the above findings. IMPRESSION: 1. No evidence of pulmonary embolism or other acute cardiopulmonary disease. 2. Moderate severity centrilobular and paraseptal emphysematous lung disease. 3. Stable right lower lobe noncalcified lung nodules. Additional follow-up chest CT at 18-24 months (from the July 16, 2021 scan) is considered optional for low-risk patients, but is recommended for high-risk patients. This recommendation follows the consensus statement: Guidelines for Management of Incidental Pulmonary Nodules Detected on CT Images: From the Fleischner Society 2017; Radiology 2017; 284:228-243. 4. Small hiatal hernia. Aortic Atherosclerosis (ICD10-I70.0) and Emphysema (ICD10-J43.9). Electronically Signed   By: TVirgina NorfolkM.D.   On: 01/11/2022 22:34   DG Chest 1 View  Result Date: 01/11/2022 CLINICAL DATA:  10031. Complains of congestion, cough and headache. History COPD. EXAM: CHEST  1 VIEW COMPARISON:  Lung screening chest CT 07/16/2021, portable chest 08/02/2015 FINDINGS: The cardiac size and mediastinal configuration are normal. There is aortic atherosclerosis. There are trace pleural effusions. The central vessels are mildly prominent in relation to prior studies. The lungs are emphysematous. There are diffuse increased interstitial markings which could be due to interstitial edema or interstitial pneumonia. There are underlying scattered areas of ground-glass opacity which could be ground-glass edema or ground-glass pneumonitis. IMPRESSION: Increased vascular and interstitial prominence, could be due to perihilar vascular congestion and interstitial edema,  or interstitial pneumonia with scattered areas of ground-glass edema or pneumonitis. The cardiac size is normal. Trace pleural effusions are also noted. Clinical correlation and radiographic follow-up recommended. Electronically Signed   By: KTelford NabM.D.   On: 01/11/2022 22:06    Microbiology: Results for orders placed or performed during the hospital encounter of 01/11/22  SARS Coronavirus 2 by RT PCR (hospital order, [facility]) *cepheid single result test* Anterior Nasal Swab     Status: None   Collection Time: 01/11/22  9:21 PM   Specimen: Anterior Nasal Swab  Result Value Ref Range Status   SARS Coronavirus 2 by RT PCR NEGATIVE NEGATIVE Final    Comment: (NOTE) SARS-CoV-2 target nucleic acids are NOT DETECTED.  The SARS-CoV-2 RNA is generally detectable in upper and lower respiratory specimens during the acute phase of infection. The lowest concentration of SARS-CoV-2 viral copies this assay can detect is 250 copies / mL. A negative result does  not preclude SARS-CoV-2 infection and should not be used as the sole basis for treatment or other patient management decisions.  A negative result may occur with improper specimen collection / handling, submission of specimen other than nasopharyngeal swab, presence of viral mutation(s) within the areas targeted by this assay, and inadequate number of viral copies (<250 copies / mL). A negative result must be combined with clinical observations, patient history, and epidemiological information.  Fact Sheet for Patients:   https://www.patel.info/  Fact Sheet for Healthcare Providers: https://hall.com/  This test is not yet approved or  cleared by the [place] FDA and has been authorized for detection and/or diagnosis of SARS-CoV-2 by FDA under an Emergency Use Authorization (EUA).  This EUA will remain in effect (meaning this test can be used) for the duration  of the COVID-19 declaration under Section 564(b)(1) of the Act, 21 U.S.C. section 360bbb-3(b)(1), unless the authorization is terminated or revoked sooner.  Performed at [facility], [address] [place], [place] [address]   Blood culture (routine x 2)     Status: None (Preliminary result)   Collection Time: 01/11/22  9:51 PM   Specimen: BLOOD  Result Value Ref Range Status   Specimen Description BLOOD LEFT ASSIST CONTROL  Final   Special Requests   Final    BOTTLES DRAWN AEROBIC AND ANAEROBIC Blood Culture results may not be optimal due to an inadequate volume of blood received in culture bottles   Culture   Final    NO GROWTH < 12 HOURS Performed at [facility], [address] [place], [place] [address]    Report Status PENDING  Incomplete  Blood culture (routine x 2)     Status: None (Preliminary result)   Collection Time: 01/11/22  9:57 PM   Specimen: BLOOD  Result Value Ref Range Status   Specimen Description BLOOD LEFT ARM  Final   Special Requests   Final    BOTTLES DRAWN AEROBIC AND ANAEROBIC Blood Culture adequate volume   Culture   Final    NO GROWTH < 12 HOURS Performed at [facility], [address], [place], [place] [address]    Report Status PENDING  Incomplete    Labs: CBC: Recent Labs  Lab 01/11/22 2121  WBC 21.7*  NEUTROABS 17.4*  HGB 14.2  HCT 42.5  MCV 96.6  PLT 621   Basic Metabolic Panel: Recent Labs  Lab 01/11/22 2121  NA 139  K 3.5  CL 104  CO2 23  GLUCOSE 175*  BUN 22  CREATININE 0.65  CALCIUM 8.8*   Liver Function Tests: Recent Labs  Lab 01/11/22 2121  AST 37  ALT 24  ALKPHOS 127*  BILITOT 1.2  PROT 7.4  ALBUMIN 3.6   CBG: No results for input(s): "GLUCAP" in the last 168 hours.  Discharge time spent: approximately 35 minutes spent on discharge counseling, evaluation of patient on day of discharge, and coordination of discharge planning with nursing, social work, pharmacy and case  management  Signed: Edwin Dada, MD [facility] 01/12/2022

## 2022-01-12 NOTE — Progress Notes (Signed)
Lab called for critical value of Troponin at 148. Paged Hospitalist and made aware. NP Called for repeat lab draw at 5am.

## 2022-01-12 NOTE — Plan of Care (Signed)

## 2022-01-12 NOTE — Progress Notes (Signed)
Discharge instructions reviewed w/ patient. [place]co given for ride  home as per patient request. No questions or concerns regarding instructions. All active LDA's removed. Patient happy to be going home. Spouse at bedside to transport patient home.

## 2022-01-12 NOTE — Progress Notes (Signed)
Oxygen saturated assessed at rest on room air at 95%, ambulated patient entire length of unit and patient remained stable w/ O2 saturations 93 and 94% w/ no complaints of shortness of breath or dyspnea.

## 2022-01-12 NOTE — Progress Notes (Signed)
       CROSS COVER NOTE  NAME: Taylor Chambers MRN: [id number] DOB : [birth year]    Date of Service   01/12/22  HPI/Events of Note   Notified of elevated Troponin 148 up from 25 ~3hrs prior. Suspect this is secondary to demand, patient presented in respiratory distress and tachycardic. No reports of chest pain on arrival and denies chest pain at this time.  Interventions   Plan: Repeat Troponin with AM labs     This document was prepared using Dragon voice recognition software and may include unintentional dictation errors.  Neomia Glass DNP, MHA, FNP-BC Nurse Practitioner [facility] Pager [phone]

## 2022-01-12 NOTE — Evaluation (Signed)
Occupational Therapy Evaluation Patient Details Name: Taylor Chambers MRN: [id number] DOB: 27-Jun-1945 Today's Date: 01/12/2022   History of Present Illness Pt is a 76 y/o F s/p R THA w/ posterior approach 11/10/21. PMH COPD, HTN, GERD, osteoporosis, DOE, and bipolar disorder.   Clinical Impression   Patient presenting with decreased Ind in self care, balance, functional mobility/transfers, endurance, and safety awareness. Patient reports living at home with husband and being independent since last hospital admission with THA. Pt is independent with bed mobility and stands without assistance. She does have LOB needing min guard when standing with self care items  in hands. Pt standing at sink for grooming tasks without assistance. RR is noted to increase to 40 and HR increased to 120's while standing. Pt returning to sit on EOB and educated on use of incentive spirometer. She demonstrated x 5 reps. RN present in the room with medications and all needs within reach. Patient will benefit from acute OT to increase overall independence in the areas of ADLs, functional mobility, and safety awareness in order to safely discharge home with family.      Recommendations for follow up therapy are one component of a multi-disciplinary discharge planning process, led by the attending physician.  Recommendations may be updated based on patient status, additional functional criteria and insurance authorization.   Follow Up Recommendations  No OT follow up    Assistance Recommended at Discharge Intermittent Supervision/Assistance  Patient can return home with the following A little help with bathing/dressing/bathroom;Assistance with cooking/housework;Assist for transportation;Help with stairs or ramp for entrance;A little help with walking and/or transfers    Functional Status Assessment  Patient has had a recent decline in their functional status and demonstrates the ability to make significant improvements in  function in a reasonable and predictable amount of time.  Equipment Recommendations  None recommended by OT       Precautions / Restrictions Precautions Precautions: None Restrictions Weight Bearing Restrictions: No      Mobility Bed Mobility Overal bed mobility: Modified Independent             General bed mobility comments: Pt moves in bed with confidence    Transfers Overall transfer level: Independent Equipment used: None                      Balance Overall balance assessment: Needs assistance Sitting-balance support: No upper extremity supported Sitting balance-Leahy Scale: Normal     Standing balance support: During functional activity Standing balance-Leahy Scale: Fair                             ADL either performed or assessed with clinical judgement   ADL Overall ADL's : Needs assistance/impaired                                       General ADL Comments: Pt ambulates and stands at sink with supervision for self care tasks. She has some staggering steps when carrying ADL items to sink needing min guard to correct during this session.     Vision Patient Visual Report: No change from baseline              Pertinent Vitals/Pain Pain Assessment Pain Assessment: No/denies pain     Hand Dominance Right   Extremity/Trunk Assessment Upper Extremity Assessment Upper Extremity Assessment: Overall [facility]  for tasks assessed   Lower Extremity Assessment Lower Extremity Assessment: Overall WFL for tasks assessed       Communication Communication Communication: No difficulties   Cognition Arousal/Alertness: Awake/alert Behavior During Therapy: WFL for tasks assessed/performed Overall Cognitive Status: Within Functional Limits for tasks assessed                                                  Home Living Family/patient expects to be discharged to:: Private residence Living Arrangements:  Spouse/significant other Available Help at Discharge: Family;Available 24 hours/day Type of Home: House Home Access: Stairs to enter [employer] of Steps: 18 steps in basement entry w/ chair lift   Home Layout: Two level;Able to live on main level with bedroom/bathroom     Bathroom Shower/Tub: Tub/shower unit;Walk-in shower   Bathroom Toilet: Standard Bathroom Accessibility: Yes   Home Equipment: Grab bars - tub/shower;Hand held shower head;BSC/3in1;Cane - quad;Rolling Walker (2 wheels)          Prior Functioning/Environment Prior Level of Function : Independent/Modified Independent;Driving             Mobility Comments: No hx of falls; no AD - reports she drives and stays active ADLs Comments: Independent w/ ADLs        OT Problem List: Decreased strength;Decreased activity tolerance;Impaired balance (sitting and/or standing);Decreased safety awareness;Decreased knowledge of use of DME or AE      OT Treatment/Interventions: Self-care/ADL training;Therapeutic exercise;Therapeutic activities;DME and/or AE instruction;Balance training;Patient/family education;Energy conservation    OT Goals(Current goals can be found in the care plan section) Acute Rehab OT Goals Patient Stated Goal: to go home OT Goal Formulation: With patient Time For Goal Achievement: 01/26/22 Potential to Achieve Goals: Good ADL Goals Pt Will Perform Grooming: standing;Independently Pt Will Perform Lower Body Dressing: with modified independence;sit to/from stand Pt Will Transfer to Toilet: with modified independence;ambulating Pt Will Perform Toileting - Clothing Manipulation and hygiene: with modified independence;sit to/from stand Additional ADL Goal #1: Pt will demonstrate energy conservation strategies during self care tasks independently.  OT Frequency: Min 2X/week       AM-PAC OT "6 Clicks" Daily Activity     Outcome Measure Help from another person eating meals?: None Help  from another person taking care of personal grooming?: None Help from another person toileting, which includes using toliet, bedpan, or urinal?: A Little Help from another person bathing (including washing, rinsing, drying)?: A Little Help from another person to put on and taking off regular upper body clothing?: None Help from another person to put on and taking off regular lower body clothing?: A Little 6 Click Score: 21   End of Session Nurse Communication: Mobility status  Activity Tolerance: Patient tolerated treatment well Patient left: in bed;with call bell/phone within reach;with nursing/sitter in room;with family/visitor present  OT Visit Diagnosis: Unsteadiness on feet (R26.81);Repeated falls (R29.6);Muscle weakness (generalized) (M62.81)                Time: 6203-5597 OT Time Calculation (min): 18 min Charges:  OT General Charges $OT Visit: 1 Visit OT Evaluation $OT Eval Moderate Complexity: 1 Mod OT Treatments $Self Care/Home Management : 8-22 mins  Darleen Crocker, MS, OTR/L , CBIS ascom [phone]  01/12/22, 2:07 PM

## 2022-01-12 NOTE — TOC Progression Note (Signed)
Transition of Care [facility]) - Progression Note    Patient Details  Name: Taylor Chambers MRN: [id number] Date of Birth: December 03, 1945  Transition of Care [facility]) CM/SW [place], RN Phone Number: 01/12/2022, 3:54 PM  Clinical Narrative:    Reviewed the code 79 with the patient        Expected Discharge Plan and Services                                                 Social Determinants of Health (SDOH) Interventions    Readmission Risk Interventions     No data to display

## 2022-01-12 NOTE — Care Management CC44 (Signed)
Condition Code 44 Documentation Completed  Patient Details  Name: Taylor Chambers MRN: [id number] Date of Birth: [birth year]   Condition Code 44 given:  Yes Patient signature on Condition Code 44 notice:  Yes Documentation of 2 MD's agreement:  Yes Code 44 added to claim:  Yes    Conception Oms, RN 01/12/2022, 3:53 PM

## 2022-01-12 NOTE — Plan of Care (Signed)
  Problem: Fluid Volume: Goal: Hemodynamic stability will improve 01/12/2022 1447 by Ardelia Mems, RN Outcome: Adequate for Discharge 01/12/2022 [phone] by Ardelia Mems, RN Outcome: Progressing   Problem: Clinical Measurements: Goal: Diagnostic test results will improve 01/12/2022 1447 by Ardelia Mems, RN Outcome: Adequate for Discharge 01/12/2022 [phone] by Ardelia Mems, RN Outcome: Progressing Goal: Signs and symptoms of infection will decrease 01/12/2022 1447 by Ardelia Mems, RN Outcome: Adequate for Discharge 01/12/2022 [phone] by Ardelia Mems, RN Outcome: Progressing   Problem: Respiratory: Goal: Ability to maintain adequate ventilation will improve 01/12/2022 1447 by Ardelia Mems, RN Outcome: Adequate for Discharge 01/12/2022 [phone] by Ardelia Mems, RN Outcome: Progressing   Problem: Education: Goal: Knowledge of General Education information will improve Description: Including pain rating scale, medication(s)/side effects and non-pharmacologic comfort measures 01/12/2022 1447 by Ardelia Mems, RN Outcome: Adequate for Discharge 01/12/2022 [phone] by Ardelia Mems, RN Outcome: Progressing   Problem: Health Behavior/Discharge Planning: Goal: Ability to manage health-related needs will improve 01/12/2022 1447 by Ardelia Mems, RN Outcome: Adequate for Discharge 01/12/2022 [phone] by Ardelia Mems, RN Outcome: Progressing   Problem: Clinical Measurements: Goal: Ability to maintain clinical measurements within normal limits will improve 01/12/2022 1447 by Ardelia Mems, RN Outcome: Adequate for Discharge 01/12/2022 [phone] by Ardelia Mems, RN Outcome: Progressing Goal: Will remain free from infection 01/12/2022 1447 by Ardelia Mems, RN Outcome: Adequate for Discharge 01/12/2022 [phone] by Ardelia Mems, RN Outcome: Progressing Goal: Diagnostic test results will improve 01/12/2022 1447 by Ardelia Mems,  RN Outcome: Adequate for Discharge 01/12/2022 [phone] by Ardelia Mems, RN Outcome: Progressing Goal: Respiratory complications will improve 01/12/2022 1447 by Ardelia Mems, RN Outcome: Adequate for Discharge 01/12/2022 [phone] by Ardelia Mems, RN Outcome: Progressing Goal: Cardiovascular complication will be avoided 01/12/2022 1447 by Ardelia Mems, RN Outcome: Adequate for Discharge 01/12/2022 [phone] by Ardelia Mems, RN Outcome: Progressing   Problem: Activity: Goal: Risk for activity intolerance will decrease 01/12/2022 1447 by Ardelia Mems, RN Outcome: Adequate for Discharge 01/12/2022 [phone] by Ardelia Mems, RN Outcome: Progressing   Problem: Nutrition: Goal: Adequate nutrition will be maintained 01/12/2022 1447 by Ardelia Mems, RN Outcome: Adequate for Discharge 01/12/2022 [phone] by Ardelia Mems, RN Outcome: Progressing   Problem: Coping: Goal: Level of anxiety will decrease 01/12/2022 1447 by Ardelia Mems, RN Outcome: Adequate for Discharge 01/12/2022 [phone] by Ardelia Mems, RN Outcome: Progressing   Problem: Elimination: Goal: Will not experience complications related to bowel motility 01/12/2022 1447 by Ardelia Mems, RN Outcome: Adequate for Discharge 01/12/2022 [phone] by Ardelia Mems, RN Outcome: Progressing Goal: Will not experience complications related to urinary retention 01/12/2022 1447 by Ardelia Mems, RN Outcome: Adequate for Discharge 01/12/2022 [phone] by Ardelia Mems, RN Outcome: Progressing   Problem: Pain Managment: Goal: General experience of comfort will improve 01/12/2022 1447 by Ardelia Mems, RN Outcome: Adequate for Discharge 01/12/2022 [phone] by Ardelia Mems, RN Outcome: Progressing   Problem: Safety: Goal: Ability to remain free from injury will improve 01/12/2022 1447 by Ardelia Mems, RN Outcome: Adequate for Discharge 01/12/2022 [phone] by Ardelia Mems,  RN Outcome: Progressing   Problem: Skin Integrity: Goal: Risk for impaired skin integrity will decrease 01/12/2022 1447 by Ardelia Mems, RN Outcome: Adequate for Discharge 01/12/2022 [phone] by Ardelia Mems, RN Outcome: Progressing

## 2022-01-12 NOTE — TOC Initial Note (Signed)
Transition of Care [facility]) - Initial/Assessment Note    Patient Details  Name: Taylor Chambers MRN: [id number] Date of Birth: 05/17/1946  Transition of Care [facility]) CM/SW Contact:    Conception Oms, RN Phone Number: 01/12/2022, 11:26 AM  Clinical Narrative:                  Transition of Care [facility]) Screening Note   Patient Details  Name: Taylor Chambers Date of Birth: [birth year]   Transition of Care [facility]) CM/SW Contact:    Conception Oms, RN Phone Number: 01/12/2022, 11:26 AM    Transition of Care Department [facility]) has reviewed patient and no TOC needs have been identified at this time. We will continue to monitor patient advancement through interdisciplinary progression rounds. If new patient transition needs arise, please place a TOC consult.          Patient Goals and CMS Choice        Expected Discharge Plan and Services                                                Prior Living Arrangements/Services                       Activities of Daily Living Home Assistive Devices/Equipment: Nebulizer ADL Screening (condition at time of admission) Patient's cognitive ability adequate to safely complete daily activities?: Yes Is the patient deaf or have difficulty hearing?: No Does the patient have difficulty seeing, even when wearing glasses/contacts?: No Does the patient have difficulty concentrating, remembering, or making decisions?: No Patient able to express need for assistance with ADLs?: Yes Does the patient have difficulty dressing or bathing?: No Independently performs ADLs?: Yes (appropriate for developmental age) Does the patient have difficulty walking or climbing stairs?: No Weakness of Legs: None Weakness of Arms/Hands: None  Permission Sought/Granted                  Emotional Assessment              Admission diagnosis:  COPD with acute exacerbation ([place]) [J44.1] Patient Active Problem List   Diagnosis Date Noted    Lung nodule 01/12/2022   Myocardial injury 01/12/2022   COPD with acute exacerbation ([place]) 01/11/2022   Sepsis ([place]) 01/11/2022   Acute respiratory failure with hypoxia ([place]) 01/11/2022   Hx of total hip arthroplasty, 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 11/10/2021   H/O heartburn 06/24/2021   Centrilobular emphysema ([place]) 02/05/2019   Tobacco use disorder 10/07/2013   Benign essential hypertension 10/07/2013   Symptomatic menopausal or female climacteric states 10/07/2013   PCP:  Leonel Ramsay, MD Pharmacy:   [place], [place] [facility] [address] [place] [address] Phone: [phone] Fax: [phone]     Social Determinants of Health (SDOH) Interventions    Readmission Risk Interventions     No data to display

## 2022-01-12 NOTE — Progress Notes (Signed)
Initial Nutrition Assessment  DOCUMENTATION CODES:   Non-severe (moderate) malnutrition in context of chronic illness  INTERVENTION:   Ensure Enlive po TID, each supplement provides 350 kcal and 20 grams of protein.  MVI po daily   Liberalize diet  Pt at high refeed risk; recommend monitor potassium, magnesium and phosphorus labs daily until stable  NUTRITION DIAGNOSIS:   Moderate Malnutrition related to chronic illness (COPD) as evidenced by moderate muscle depletion, moderate fat depletion.  GOAL:   Patient will meet greater than or equal to 90% of their needs  MONITOR:   PO intake, Supplement acceptance, Labs, Weight trends, Skin, I & O's  REASON FOR ASSESSMENT:   Consult Assessment of nutrition requirement/status  ASSESSMENT:   76 y/o female with h/o COPD, HTN, bipolar disorder, depression, R total hip arthroplasty 6/7 and HLD who is admitted with COPD exacerbation.  Met with pt in room today. Pt reports decreased appetite and oral intake for 5 days pta r/t SOB. Pt reports that she ate pretty well at breakfast this morning. Pt reports that she has never tried supplements before at home but she is willing to try chocolate or strawberry Ensure in hospital. RD discussed with pt the importance of adequate nutrition needed to preserve lean muscle. RD will add supplements and MVI to help pt meet her estimated needs. RD will also liberalize pt's diet. Pt is at refeed risk. Per chart, pt is down ~6lbs(5%) over the past month; this is significant weight loss. Pt reports that weight loss is r/t to her recent hip surgery as she reports that the pain medications decreased her appetite.   Medications reviewed and include: lovenox, solu-medrol, azithromycin, ceftriaxone   Labs reviewed: K 3.5 wnl- 8/8 Wbc- 21.7(H)- 8/8  NUTRITION - FOCUSED PHYSICAL EXAM:  Flowsheet Row Most Recent Value  Orbital Region No depletion  Upper Arm Region Mild depletion  Thoracic and Lumbar Region  Mild depletion  Buccal Region No depletion  Temple Region No depletion  Clavicle Bone Region Moderate depletion  Clavicle and Acromion Bone Region Moderate depletion  Scapular Bone Region Mild depletion  Dorsal Hand Moderate depletion  Patellar Region Moderate depletion  Anterior Thigh Region Mild depletion  Posterior Calf Region Mild depletion  Edema (RD Assessment) None  Hair Reviewed  Eyes Reviewed  Mouth Reviewed  Skin Reviewed  Nails Reviewed   Diet Order:   Diet Order             Diet regular Room service appropriate? Yes; Fluid consistency: Thin  Diet effective now                  EDUCATION NEEDS:   Education needs have been addressed  Skin:  Skin Assessment: Reviewed RN Assessment  Last BM:  8/8  Height:   Ht Readings from Last 1 Encounters:  01/11/22 _0  (1.575 m)    Weight:   Wt Readings from Last 1 Encounters:  01/11/22 47.6 kg    Ideal Body Weight:  50 kg  BMI:  Body mass index is 19.2 kg/m.  Estimated Nutritional Needs:   Kcal:  1400-1600kcal/day  Protein:  70-80g/day  Fluid:  1.2-1.4L/day  Koleen Distance MS, RD, LDN Please refer to [facility] for RD and/or RD on-call/weekend/after hours pager

## 2022-01-16 LAB — CULTURE, BLOOD (ROUTINE X 2)
Culture: NO GROWTH
Culture: NO GROWTH
Special Requests: ADEQUATE

## 2022-02-16 ENCOUNTER — Telehealth: Payer: Self-pay | Admitting: Acute Care

## 2022-02-16 DIAGNOSIS — Z87891 Personal history of nicotine dependence: Secondary | ICD-10-CM

## 2022-02-16 DIAGNOSIS — Z122 Encounter for screening for malignant neoplasm of respiratory organs: Secondary | ICD-10-CM

## 2022-02-16 DIAGNOSIS — F17210 Nicotine dependence, cigarettes, uncomplicated: Secondary | ICD-10-CM

## 2022-02-16 NOTE — Telephone Encounter (Signed)
This patient will need a 12 month follow up from the CTA done 01/2022. They recommended 18-24 months, but we will do her annual scan as we do all the screening patients. Thanks so much

## 2022-02-16 NOTE — Telephone Encounter (Signed)
New Low dose lung screening CT order placed due 01/2023.

## 2022-04-07 ENCOUNTER — Other Ambulatory Visit: Payer: Self-pay | Admitting: Internal Medicine

## 2022-04-07 ENCOUNTER — Ambulatory Visit
Admission: RE | Admit: 2022-04-07 | Discharge: 2022-04-07 | Disposition: A | Discharge status: Home / Self Care | Payer: Medicare HMO | Source: Ambulatory Visit | Attending: Internal Medicine | Admitting: Internal Medicine

## 2022-04-07 DIAGNOSIS — I2089 Other forms of angina pectoris: Secondary | ICD-10-CM | POA: Insufficient documentation

## 2022-04-07 MED ORDER — NITROGLYCERIN 0.4 MG SL SUBL
0.8000 mg | SUBLINGUAL_TABLET | Freq: Once | SUBLINGUAL | Status: AC
  Administered 2022-04-07: 0.8 mg via SUBLINGUAL

## 2022-04-07 MED ORDER — METOPROLOL TARTRATE 5 MG/5ML IV SOLN
5.0000 mg | Freq: Once | INTRAVENOUS | Status: AC
  Administered 2022-04-07: 5 mg via INTRAVENOUS

## 2022-04-07 MED ORDER — METOPROLOL TARTRATE 5 MG/5ML IV SOLN
10.0000 mg | Freq: Once | INTRAVENOUS | Status: AC
  Administered 2022-04-07: 10 mg via INTRAVENOUS

## 2022-04-07 MED ORDER — IOHEXOL 350 MG/ML SOLN
75.0000 mL | Freq: Once | INTRAVENOUS | Status: AC | PRN
  Administered 2022-04-07: 75 mL via INTRAVENOUS

## 2022-04-07 MED ORDER — DILTIAZEM HCL 25 MG/5ML IV SOLN
5.0000 mg | Freq: Once | INTRAVENOUS | Status: AC
  Administered 2022-04-07: 5 mg via INTRAVENOUS

## 2022-09-05 ENCOUNTER — Other Ambulatory Visit: Payer: Self-pay

## 2022-09-05 ENCOUNTER — Observation Stay
Admission: EM | Admit: 2022-09-05 | Discharge: 2022-09-06 | Disposition: A | Discharge status: Home / Self Care | Payer: Medicare HMO | Attending: Internal Medicine | Admitting: Internal Medicine

## 2022-09-05 ENCOUNTER — Emergency Department: Payer: Medicare HMO

## 2022-09-05 ENCOUNTER — Encounter: Payer: Self-pay | Admitting: Internal Medicine

## 2022-09-05 DIAGNOSIS — Z79899 Other long term (current) drug therapy: Secondary | ICD-10-CM | POA: Insufficient documentation

## 2022-09-05 DIAGNOSIS — I10 Essential (primary) hypertension: Secondary | ICD-10-CM | POA: Insufficient documentation

## 2022-09-05 DIAGNOSIS — F17210 Nicotine dependence, cigarettes, uncomplicated: Secondary | ICD-10-CM | POA: Insufficient documentation

## 2022-09-05 DIAGNOSIS — D7589 Other specified diseases of blood and blood-forming organs: Secondary | ICD-10-CM | POA: Insufficient documentation

## 2022-09-05 DIAGNOSIS — Z96641 Presence of right artificial hip joint: Secondary | ICD-10-CM | POA: Insufficient documentation

## 2022-09-05 DIAGNOSIS — I480 Paroxysmal atrial fibrillation: Secondary | ICD-10-CM | POA: Diagnosis not present

## 2022-09-05 DIAGNOSIS — J441 Chronic obstructive pulmonary disease with (acute) exacerbation: Secondary | ICD-10-CM | POA: Diagnosis not present

## 2022-09-05 DIAGNOSIS — D72829 Elevated white blood cell count, unspecified: Secondary | ICD-10-CM | POA: Diagnosis not present

## 2022-09-05 DIAGNOSIS — J9621 Acute and chronic respiratory failure with hypoxia: Secondary | ICD-10-CM | POA: Diagnosis not present

## 2022-09-05 DIAGNOSIS — Z1152 Encounter for screening for COVID-19: Secondary | ICD-10-CM | POA: Diagnosis not present

## 2022-09-05 DIAGNOSIS — I4891 Unspecified atrial fibrillation: Secondary | ICD-10-CM | POA: Diagnosis present

## 2022-09-05 DIAGNOSIS — R0602 Shortness of breath: Secondary | ICD-10-CM | POA: Diagnosis present

## 2022-09-05 DIAGNOSIS — F17200 Nicotine dependence, unspecified, uncomplicated: Secondary | ICD-10-CM | POA: Diagnosis present

## 2022-09-05 LAB — CBC
HCT: 42.3 % (ref 36.0–46.0)
Hemoglobin: 14 g/dL (ref 12.0–15.0)
MCH: 33.4 pg (ref 26.0–34.0)
MCHC: 33.1 g/dL (ref 30.0–36.0)
MCV: 101 fL — ABNORMAL HIGH (ref 80.0–100.0)
Platelets: 303 10*3/uL (ref 150–400)
RBC: 4.19 MIL/uL (ref 3.87–5.11)
RDW: 12.4 % (ref 11.5–15.5)
WBC: 20.7 10*3/uL — ABNORMAL HIGH (ref 4.0–10.5)
nRBC: 0 % (ref 0.0–0.2)

## 2022-09-05 LAB — BASIC METABOLIC PANEL
Anion gap: 14 (ref 5–15)
BUN: 10 mg/dL (ref 8–23)
CO2: 19 mmol/L — ABNORMAL LOW (ref 22–32)
Calcium: 8.8 mg/dL — ABNORMAL LOW (ref 8.9–10.3)
Chloride: 103 mmol/L (ref 98–111)
Creatinine, Ser: 0.62 mg/dL (ref 0.44–1.00)
GFR, Estimated: >60 mL/min (ref >60)
Glucose, Bld: 128 mg/dL — ABNORMAL HIGH (ref 70–99)
Potassium: 3.8 mmol/L (ref 3.5–5.1)
Sodium: 136 mmol/L (ref 135–145)

## 2022-09-05 LAB — RESP PANEL BY RT-PCR (RSV, FLU A&B, COVID)  RVPGX2
Influenza A by PCR: NEGATIVE
Influenza B by PCR: NEGATIVE
Resp Syncytial Virus by PCR: NEGATIVE
SARS Coronavirus 2 by RT PCR: NEGATIVE

## 2022-09-05 LAB — TROPONIN I (HIGH SENSITIVITY)
Troponin I (High Sensitivity): 11 ng/L (ref <18)
Troponin I (High Sensitivity): 11 ng/L (ref <18)

## 2022-09-05 LAB — LACTIC ACID, PLASMA: Lactic Acid, Venous: 1.5 mmol/L (ref 0.5–1.9)

## 2022-09-05 LAB — CBG MONITORING, ED: Glucose-Capillary: 180 mg/dL — ABNORMAL HIGH (ref 70–99)

## 2022-09-05 LAB — MAGNESIUM: Magnesium: 2.1 mg/dL (ref 1.7–2.4)

## 2022-09-05 MED ORDER — METHYLPREDNISOLONE SODIUM SUCC 125 MG IJ SOLR: 60.0000 mg | Freq: Every day | INTRAMUSCULAR | Status: DC

## 2022-09-05 MED ORDER — SODIUM CHLORIDE 0.9 % IV SOLN
1.0000 g | INTRAVENOUS | Status: DC
  Administered 2022-09-05: 1 g via INTRAVENOUS
  Filled 2022-09-05: qty 10

## 2022-09-05 MED ORDER — INSULIN ASPART 100 UNIT/ML IJ SOLN: 0.0000 [IU] | Freq: Every day | INTRAMUSCULAR | Status: DC

## 2022-09-05 MED ORDER — DILTIAZEM HCL 25 MG/5ML IV SOLN
10.0000 mg | Freq: Once | INTRAVENOUS | Status: AC
  Administered 2022-09-05: 10 mg via INTRAVENOUS

## 2022-09-05 MED ORDER — INSULIN ASPART 100 UNIT/ML IJ SOLN
0.0000 [IU] | Freq: Three times a day (TID) | INTRAMUSCULAR | Status: DC
  Administered 2022-09-06: 2 [IU] via SUBCUTANEOUS
  Filled 2022-09-05: qty 1

## 2022-09-05 MED ORDER — PANTOPRAZOLE SODIUM 20 MG PO TBEC
20.0000 mg | DELAYED_RELEASE_TABLET | Freq: Every day | ORAL | Status: DC
  Filled 2022-09-05 (×2): qty 1

## 2022-09-05 MED ORDER — DILTIAZEM HCL 25 MG/5ML IV SOLN: 5.0000 mg | Freq: Once | INTRAVENOUS | Status: AC

## 2022-09-05 MED ORDER — DILTIAZEM HCL 25 MG/5ML IV SOLN
INTRAVENOUS | Status: AC
  Administered 2022-09-05: 5 mg via INTRAVENOUS
  Filled 2022-09-05: qty 5

## 2022-09-05 MED ORDER — IPRATROPIUM-ALBUTEROL 0.5-2.5 (3) MG/3ML IN SOLN
3.0000 mL | Freq: Once | RESPIRATORY_TRACT | Status: AC
  Administered 2022-09-05: 3 mL via RESPIRATORY_TRACT
  Filled 2022-09-05: qty 3

## 2022-09-05 MED ORDER — METHYLPREDNISOLONE SODIUM SUCC 125 MG IJ SOLR
125.0000 mg | Freq: Once | INTRAMUSCULAR | Status: AC
  Administered 2022-09-05: 125 mg via INTRAVENOUS
  Filled 2022-09-05: qty 2

## 2022-09-05 MED ORDER — SODIUM CHLORIDE 0.9 % IV BOLUS
500.0000 mL | Freq: Once | INTRAVENOUS | Status: AC
  Administered 2022-09-05: 500 mL via INTRAVENOUS

## 2022-09-05 MED ORDER — SODIUM CHLORIDE 0.9 % IV SOLN
500.0000 mg | INTRAVENOUS | Status: DC
  Administered 2022-09-05: 500 mg via INTRAVENOUS
  Filled 2022-09-05: qty 5

## 2022-09-05 MED ORDER — DILTIAZEM HCL-DEXTROSE 125-5 MG/125ML-% IV SOLN (PREMIX)
INTRAVENOUS | Status: AC
  Administered 2022-09-05: 10 mg/h via INTRAVENOUS
  Filled 2022-09-05: qty 125

## 2022-09-05 MED ORDER — IPRATROPIUM-ALBUTEROL 0.5-2.5 (3) MG/3ML IN SOLN
3.0000 mL | RESPIRATORY_TRACT | Status: DC
  Administered 2022-09-05 – 2022-09-06 (×4): 3 mL via RESPIRATORY_TRACT
  Filled 2022-09-05 (×4): qty 3

## 2022-09-05 MED ORDER — DILTIAZEM HCL-DEXTROSE 125-5 MG/125ML-% IV SOLN (PREMIX): 5.0000 mg/h | INTRAVENOUS | Status: DC

## 2022-09-05 MED ORDER — DILTIAZEM HCL 30 MG PO TABS
30.0000 mg | ORAL_TABLET | Freq: Four times a day (QID) | ORAL | Status: DC
  Administered 2022-09-05 – 2022-09-06 (×2): 30 mg via ORAL
  Filled 2022-09-05 (×2): qty 1

## 2022-09-05 NOTE — ED Notes (Signed)
Report received, this RN now assuming care.  

## 2022-09-05 NOTE — Assessment & Plan Note (Signed)
Moderate to severe in intensity given that patient is still tachypneic after initial Rx in the ER.  At this time I will draw blood cultures, viral panel is negative.  Treat with ceftriaxone azithromycin intravenous methylprednisolone and inhaled bronchodilators.  Monitor with continuous pulse oximetry.

## 2022-09-05 NOTE — Assessment & Plan Note (Addendum)
Smoking cessation discussion.

## 2022-09-05 NOTE — Assessment & Plan Note (Signed)
Patient has required at least 2 doses of IV diltiazem and then started on infusion, currently infusion has been stopped.  I will start the patient on oral 30 mg of Cardizem every 6 hourly to maintain rate control.  Check thyroid panel as well as echo.  Troponin is negative.  This is a new diagnosis.  I will check baseline INR PTT with plan for anticoagulation in this 77 year old lady.  I did broach the topic of anticoagulation with this patient.  Further discussion to follow

## 2022-09-05 NOTE — Assessment & Plan Note (Signed)
Without anemia, check B12 and folate

## 2022-09-05 NOTE — Assessment & Plan Note (Signed)
Blood cultures as above

## 2022-09-05 NOTE — ED Provider Notes (Addendum)
[facility] Provider Note    Event Date/Time   First MD Initiated Contact with Patient 09/05/22 1524     (approximate)   History   Shortness of Breath   HPI  Taylor Chambers is a 77 y.o. female with a history of COPD and hypertension who presents with increased shortness of breath since yesterday associated with cough productive of green and brown sputum.  The patient has intermittently felt hot but has not noted any fever.  She denies any chest pain.  She has no leg swelling.  The patient states that she is on 2 L of O2 by nasal cannula at home intermittently when needed.  I reviewed the past medical records.  The patient was most recently admitted in August of last year and per the hospitalist discharge summary she was treated for COPD exacerbation and was ruled out for sepsis.     Physical Exam   Triage Vital Signs: ED Triage Vitals  Enc Vitals Group     BP 09/05/22 1505 (!) 120/94     Pulse Rate 09/05/22 1505 (!) 115     Resp 09/05/22 1505 (!) 28     Temp 09/05/22 1505 99.3 F (37.4 C)     Temp src --      SpO2 09/05/22 1505 95 %     Weight 09/05/22 1506 112 lb (50.8 kg)     Height 09/05/22 1506 5\' 2"  (1.575 m)     Head Circumference --      Peak Flow --      Pain Score 09/05/22 1505 0     Pain Loc --      Pain Edu? --      Excl. in [place]? --     Most recent vital signs: Vitals:   09/05/22 1930 09/05/22 1945  BP: 131/78 139/63  Pulse:    Resp: (!) 21   Temp:    SpO2: 99% 97%     General: Awake, no distress, relatively well-appearing.  CV:  Good peripheral perfusion.  Resp:  Normal effort.  Diminished breath sounds bilaterally with scattered wheezes. Abd:  No distention.  Other:  No peripheral edema.   ED Results / Procedures / Treatments   Labs (all labs ordered are listed, but only abnormal results are displayed) Labs Reviewed  BASIC METABOLIC PANEL - Abnormal; Notable for the following components:      Result Value   CO2 19  (*)    Glucose, Bld 128 (*)    Calcium 8.8 (*)    All other components within normal limits  CBC - Abnormal; Notable for the following components:   WBC 20.7 (*)    MCV 101.0 (*)    All other components within normal limits  RESP PANEL BY RT-PCR (RSV, FLU A&B, COVID)  [id number]  LACTIC ACID, PLASMA  MAGNESIUM  TROPONIN I (HIGH SENSITIVITY)  TROPONIN I (HIGH SENSITIVITY)     EKG  ED ECG REPORT I, Arta Silence, the attending physician, personally viewed and interpreted this ECG.  Date: 09/05/2022 EKG Time: 1514 Rate: 116 Rhythm: Sinus tachycardia QRS Axis: normal Intervals: normal ST/T Wave abnormalities: Nonspecific ST abnormalities Narrative Interpretation: no evidence of acute ischemia   ED ECG REPORT I, Arta Silence, the attending physician, personally viewed and interpreted this ECG.  Date: 09/05/2022 EKG Time: 1559 Rate: 189 Rhythm: Atrial fibrillation with RVR QRS Axis: normal Intervals: normal ST/T Wave abnormalities: Nonspecific ST abnormalities Narrative Interpretation: no evidence of acute ischemia  RADIOLOGY  Chest x-ray: I independently viewed and interpreted the images; there is no focal consolidation or edema   PROCEDURES:  Critical Care performed: Yes, see critical care procedure note(s)  .Critical Care  Performed by: Arta Silence, MD Authorized by: Arta Silence, MD   Critical care provider statement:    Critical care time (minutes):  45   Critical care time was exclusive of:  Separately billable procedures and treating other patients   Critical care was necessary to treat or prevent imminent or life-threatening deterioration of the following conditions:  Cardiac failure and respiratory failure   Critical care was time spent personally by me on the following activities:  Development of treatment plan with patient or surrogate, discussions with consultants, evaluation of patient's response to treatment, examination of  patient, ordering and review of laboratory studies, ordering and review of radiographic studies, ordering and performing treatments and interventions, pulse oximetry, re-evaluation of patient's condition, review of old charts and obtaining history from patient or surrogate   Care discussed with: admitting provider      MEDICATIONS ORDERED IN ED: Medications  diltiazem (CARDIZEM) 125 mg in dextrose 5% 125 mL (1 mg/mL) infusion (0 mg/hr Intravenous Stopped 09/05/22 1901)  methylPREDNISolone sodium succinate (SOLU-MEDROL) 125 mg/2 mL injection 125 mg (125 mg Intravenous Given 09/05/22 1600)  ipratropium-albuterol (DUONEB) 0.5-2.5 (3) MG/3ML nebulizer solution 3 mL (3 mLs Nebulization Given 09/05/22 1550)  ipratropium-albuterol (DUONEB) 0.5-2.5 (3) MG/3ML nebulizer solution 3 mL (3 mLs Nebulization Given 09/05/22 1550)  sodium chloride 0.9 % bolus 500 mL (0 mLs Intravenous Stopped 09/05/22 1625)  diltiazem (CARDIZEM) injection 5 mg (5 mg Intravenous Given 09/05/22 1602)  diltiazem (CARDIZEM) injection 10 mg (10 mg Intravenous Given 09/05/22 1608)  sodium chloride 0.9 % bolus 500 mL (0 mLs Intravenous Stopped 09/05/22 1733)     IMPRESSION / MDM / [place] / ED COURSE  I reviewed the triage vital signs and the nursing notes.  77 year old female with PMH as noted above presents with worsening shortness of breath since yesterday associated with productive cough.  Exam reveals diminished breath sounds bilaterally, some wheezing, and borderline elevated temperature.  Differential diagnosis includes, but is not limited to, COPD exacerbation, pneumonia, acute bronchitis, COVID-19, influenza, or other viral etiology.  We will obtain lab workup including respiratory panel to rule out acute viral infections, chest x-ray, give bronchodilators, steroid, and fluids and reassess.  Patient's presentation is most consistent with acute presentation with potential threat to life or bodily function.  The patient is on  the cardiac monitor to evaluate for evidence of arrhythmia and/or significant heart rate changes.  ----------------------------------------- 4:28 PM on 09/05/2022 -----------------------------------------  The patient went into rapid atrial fibrillation shortly after starting her nebulizer treatment with a heart rate as high as 225.  EKG shows irregular narrow complex rhythm consistent with atrial fibrillation.  The patient got 2 doses of IV Cardizem with an improvement in her rate to the 160s but remains in atrial fibrillation so I have ordered a Cardizem infusion.  The patient reports palpitations but no increase in shortness of breath and no chest pain.  Her blood pressure has remained stable.  ----------------------------------------- 7:30 PM on 09/05/2022 -----------------------------------------  The patient's heart rate improved on the Cardizem infusion.  Short time ago her rate went down to the 90s and appeared to be back in sinus so I discontinued the Cardizem infusion.  On further discussion with the patient, she confirms that she has no history of atrial fibrillation.  Chest  x-ray shows no evidence of pneumonia.  Respiratory panel is negative.  Lab workup is otherwise unremarkable for acute findings except for leukocytosis.  Lactate is normal.  I do not suspect pneumonia.  I consulted Dr. Elvia Collum from the hospitalist service; based our discussion he agrees to admit the patient.  FINAL CLINICAL IMPRESSION(S) / ED DIAGNOSES   Final diagnoses:  COPD exacerbation  Acute on chronic respiratory failure with hypoxia  Atrial fibrillation with RVR     Rx / DC Orders   ED Discharge Orders     None        Note:  This document was prepared using Dragon voice recognition software and may include unintentional dictation errors.    Arta Silence, MD 09/05/22 [id number]    Arta Silence, MD 09/05/22 [phone]

## 2022-09-05 NOTE — H&P (Signed)
History and Physical    Patient: Taylor Chambers [id number] DOB: April 20, 1946 DOA: 09/05/2022 DOS: the patient was seen and examined on 09/05/2022 PCP: Leonel Ramsay, MD  Patient coming from: Home  Chief Complaint:  Chief Complaint  Patient presents with   Shortness of Breath   HPI: Taylor Chambers is a 77 y.o. female with medical history significant of COPD with patient using suplemntal oxygen at home PRN.  At baseline patient describes at least some degree of shortness of breath with activities of living such as walking to the bathroom and back.  However since getting up this morning patient reports a worsening of shortness of breath that she would have with this degree of exertion.  Patient also, later in the encounter, reported some degree of shortness of breath present at rest.  There is no worsening of cough or sputum.  No chest pain no fever no leg swelling no cramping no presyncope.  Patient came to the hospital due to worsening shortness of breath.  Patient's son Taylor Chambers and daughter-in-law Taylor Chambers are at the bedside.  ER course is notable for patient having atrial fibrillation with rapid ventricular rate as per the signout.  Review of Systems: As mentioned in the history of present illness. All other systems reviewed and are negative. Past Medical History:  Diagnosis Date   Allergies    Anxiety    Aortic atherosclerosis (HCC)    Bipolar affective disorder (HCC)    COPD (chronic obstructive pulmonary disease) (HCC)    Coronary artery calcification seen on CT scan    Depression    Diastolic dysfunction [id number]   a.) TTE 02/24/2020: EF >55%; triv PR, mild TR, mod MR; G1DD.   DOE (dyspnea on exertion)    Emphysema lung (HCC)    History of cataract    HLD (hyperlipidemia)    Hypertension    Macular degeneration    Migraines    Osteoporosis    Pneumonia    Tobacco use    Past Surgical History:  Procedure Laterality Date   CATARACT EXTRACTION     COLONOSCOPY N/A 08/30/2021    Procedure: COLONOSCOPY;  Surgeon: Annamaria Helling, DO;  Location: [facility] ENDOSCOPY;  Service: Gastroenterology;  Laterality: N/A;   DILATION AND CURETTAGE OF UTERUS     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   Procedure: TOTAL HIP ARTHROPLASTY;  Surgeon: Dereck Leep, MD;  Location: [facility] ORS;  Service: Orthopedics;  Laterality: Right;   WISDOM TOOTH EXTRACTION     WRIST GANGLION EXCISION     Social History:  reports that she has been smoking cigarettes. She has a 12.50 pack-year smoking history. She has never used smokeless tobacco. She reports that she does not drink alcohol and does not use drugs.  Allergies  Allergen Reactions   Bupropion     Family History  Problem Relation Age of Onset   Breast cancer Mother 65    Prior to Admission medications   Medication Sig Start Date End Date Taking? Authorizing Provider  amLODipine (NORVASC) 5 MG tablet Take 5 mg by mouth daily.    [provider]  benzonatate (TESSALON) 200 MG capsule Take 1 capsule (200 mg total) by mouth 3 (three) times daily as needed for cough. 01/12/22   Danford, Suann Larry, MD  budesonide (PULMICORT) 0.25 MG/2ML nebulizer solution Take 0.25 mg by nebulization 2 (two) times daily.    [provider]  Calcium Carbonate (CALCIUM 500 PO) Take by mouth.    [provider]  celecoxib (  CELEBREX) 200 MG capsule Take 1 capsule (200 mg total) by mouth 2 (two) times daily. 11/11/21   Fausto Skillern, PA-C  Cholecalciferol (VITAMIN D3) 1.25 MG (50000 UT) CAPS Take by mouth.    [provider]  fluticasone (FLONASE) 50 MCG/ACT nasal spray Place into both nostrils as needed.    [provider]  Ipratropium-Albuterol (COMBIVENT RESPIMAT) 20-100 MCG/ACT AERS respimat Inhale 1 puff into the lungs every 6 (six) hours.    [provider]  ipratropium-albuterol (DUONEB) 0.5-2.5 (3) MG/3ML SOLN Take 3 mLs by nebulization 2 (two) times daily. Mix with budesonide    [provider]  losartan (COZAAR) 100 MG tablet Take 100 mg by mouth daily.    [provider]  Multiple Vitamins-Minerals (PRESERVISION AREDS 2+MULTI VIT PO) Take 2 tablets by mouth.    [provider]  oxyCODONE (OXY IR/ROXICODONE) 5 MG immediate release tablet Take 1 tablet (5 mg total) by mouth every 4 (four) hours as needed for severe pain. 11/11/21   Tamala Julian B, PA-C  potassium chloride SA (KLOR-CON M) 20 MEQ tablet Take 1 tablet by mouth daily. 11/08/21   [provider]  pravastatin (PRAVACHOL) 10 MG tablet Take 10 mg by mouth daily.    [provider]  predniSONE (DELTASONE) 20 MG tablet Take 2 tablets (40 mg total) by mouth daily with breakfast. 01/13/22   Danford, Suann Larry, MD  traMADol (ULTRAM) 50 MG tablet Take 1 tablet (50 mg total) by mouth every 4 (four) hours as needed for moderate pain. 11/11/21   Fausto Skillern, PA-C   Home med collection is pending. Physical Exam: Vitals:   09/05/22 1830 09/05/22 1845 09/05/22 1900 09/05/22 1915  BP: 116/67 112/74 105/80 101/68  Pulse:      Resp: (!) 30 (!) 35 18 (!) 21  Temp:      SpO2: 98% 99% 97% 100%  Weight:      Height:       General: Patient is alert awake oriented x 3 in fairly good spirits.  However is noted to have tachypnea as documented above.  While on supplemental oxygen with 2 to 3 L/min.  No other distress is apparent.  Patient is able to speak in fairly full sentences Respiratory exam: Bilateral air entry is vesicular however diminished and expiration is markedly diminished in most lung fields with occasional expiratory wheezes. Cardiovascular exam S1-S2 normal.  Telemetry shows patient is still in atrial fibrillation Abdomen soft nontender Extremities warm without edema Skin patient has erythematous patch on right leg that she describes a birthmark. Data Reviewed:  Labs on Admission:  Results for orders placed or performed during the hospital encounter of 09/05/22 (from  the past 24 hour(s))  Basic metabolic panel     Status: Abnormal   Collection Time: 09/05/22  3:11 PM  Result Value Ref Range   Sodium 136 135 - 145 mmol/L   Potassium 3.8 3.5 - 5.1 mmol/L   Chloride 103 98 - 111 mmol/L   CO2 19 (L) 22 - 32 mmol/L   Glucose, Bld 128 (H) 70 - 99 mg/dL   BUN 10 8 - 23 mg/dL   Creatinine, Ser 0.62 0.44 - 1.00 mg/dL   Calcium 8.8 (L) 8.9 - 10.3 mg/dL   GFR, Estimated >60 >60 mL/min   Anion gap 14 5 - 15  CBC     Status: Abnormal   Collection Time: 09/05/22  3:11 PM  Result Value Ref Range   WBC 20.7 (  H) 4.0 - 10.5 K/uL   RBC 4.19 3.87 - 5.11 MIL/uL   Hemoglobin 14.0 12.0 - 15.0 g/dL   HCT 42.3 36.0 - 46.0 %   MCV 101.0 (H) 80.0 - 100.0 fL   MCH 33.4 26.0 - 34.0 pg   MCHC 33.1 30.0 - 36.0 g/dL   RDW 12.4 11.5 - 15.5 %   Platelets 303 150 - 400 K/uL   nRBC 0.0 0.0 - 0.2 %  Troponin I (High Sensitivity)     Status: None   Collection Time: 09/05/22  3:11 PM  Result Value Ref Range   Troponin I (High Sensitivity) 11 <18 ng/L  Resp panel by RT-PCR (RSV, Flu A&B, Covid) Anterior Nasal Swab     Status: None   Collection Time: 09/05/22  4:12 PM   Specimen: Anterior Nasal Swab  Result Value Ref Range   SARS Coronavirus 2 by RT PCR NEGATIVE NEGATIVE   Influenza A by PCR NEGATIVE NEGATIVE   Influenza B by PCR NEGATIVE NEGATIVE   Resp Syncytial Virus by PCR NEGATIVE NEGATIVE  Lactic acid, plasma     Status: None   Collection Time: 09/05/22  4:12 PM  Result Value Ref Range   Lactic Acid, Venous 1.5 0.5 - 1.9 mmol/L  Magnesium     Status: None   Collection Time: 09/05/22  5:16 PM  Result Value Ref Range   Magnesium 2.1 1.7 - 2.4 mg/dL  Troponin I (High Sensitivity)     Status: None   Collection Time: 09/05/22  5:16 PM  Result Value Ref Range   Troponin I (High Sensitivity) 11 <18 ng/L  Radiological Exams on Admission:  DG Chest Port 1 View  Result Date: 09/05/2022 CLINICAL DATA:  Provided history: Shortness of breath. EXAM: PORTABLE CHEST 1 VIEW  COMPARISON:  Prior chest radiographs 01/11/2022 and earlier. FINDINGS: Heart size within normal limits. Aortic atherosclerosis. Pulmonary emphysema with chronic prominence of the interstitial lung markings. Nipple sign 4 shadows project over the lung bases, bilaterally. No appreciable airspace consolidation. No evidence of pleural effusion or pneumothorax. No acute osseous abnormality identified. IMPRESSION: 1. No evidence of acute cardiopulmonary abnormality. 2. Emphysema (ICD10-J43.9) with chronic prominence of the interstitial lung markings. 3. Aortic Atherosclerosis (ICD10-I70.0). Electronically Signed   By: Kellie Simmering D.O.   On: 09/05/2022 15:54    EKG: Independently reviewed. Several EKGS reviewed. Shows SVT, including sinus tachycardia. But also Afib on 7 PM ekg   Assessment and Plan: * Paroxysmal atrial fibrillation with RVR Patient has required at least 2 doses of IV diltiazem and then started on infusion, currently infusion has been stopped.  I will start the patient on oral 30 mg of Cardizem every 6 hourly to maintain rate control.  Check thyroid panel as well as echo.  Troponin is negative.  This is a new diagnosis.  I will check baseline INR PTT with plan for anticoagulation in this 77 year old lady.  I did broach the topic of anticoagulation with this patient.  Further discussion to follow  COPD exacerbation Moderate to severe in intensity given that patient is still tachypneic after initial Rx in the ER.  At this time I will draw blood cultures, viral panel is negative.  Treat with ceftriaxone azithromycin intravenous methylprednisolone and inhaled bronchodilators.  Monitor with continuous pulse oximetry.  Macrocytosis Without anemia, check B12 and folate  Leukocytosis Blood cultures as above  Tobacco use disorder Smoking cessation discussion.      Advance Care Planning:   Code Status: Prior patient  advised me that she has advanced directives filled out at home, son will  likely be able to bring a copy tomorrow.  Initially she did not know what the advance directive said and therefore full code order was put in however patient subsequently clarified that it is her equal vocal wish that she be a DNR in case her heart stops or her breathing stops completely.  Therefore I did put in a DO NOT RESUSCITATE order in the chart.  After discussion with patient I did clarify that there is no further restriction being put in the chart for patient's treatment.  And we will review patient's advanced directives in the morning.  Patient's son and daughter-in-law were at the bedside for this encounter  Consults: Not applicable  Family Communication: Family at bedside  Severity of Illness: The appropriate patient status for this patient is INPATIENT. Inpatient status is judged to be reasonable and necessary in order to provide the required intensity of service to ensure the patient's safety. The patient's presenting symptoms, physical exam findings, and initial radiographic and laboratory data in the context of their chronic comorbidities is felt to place them at high risk for further clinical deterioration. Furthermore, it is not anticipated that the patient will be medically stable for discharge from the hospital within 2 midnights of admission.   * I certify that at the point of admission it is my clinical judgment that the patient will require inpatient hospital care spanning beyond 2 midnights from the point of admission due to high intensity of service, high risk for further deterioration and high frequency of surveillance required.*  Author: Gertie Fey, MD 09/05/2022 7:29 PM  For on call review [email or web address].

## 2022-09-05 NOTE — ED Triage Notes (Addendum)
Pt to ED via POV from home with complaints of SOB. Pt has hx of COPD. Pt currently on 3L O2 [place]. Pt normally on 2L [place] at home. Pt states that she has increased work of breathing since yesterday. Pt denies hx of blood clots. Pt states that she has been coughing up brown and green sputum since yesterday. Denies fever. Pt labored with pursed lip breathing in triage.

## 2022-09-06 DIAGNOSIS — I480 Paroxysmal atrial fibrillation: Secondary | ICD-10-CM | POA: Diagnosis not present

## 2022-09-06 DIAGNOSIS — I4891 Unspecified atrial fibrillation: Secondary | ICD-10-CM | POA: Diagnosis present

## 2022-09-06 LAB — CBC
HCT: 33.7 % — ABNORMAL LOW (ref 36.0–46.0)
Hemoglobin: 11.3 g/dL — ABNORMAL LOW (ref 12.0–15.0)
MCH: 33.3 pg (ref 26.0–34.0)
MCHC: 33.5 g/dL (ref 30.0–36.0)
MCV: 99.4 fL (ref 80.0–100.0)
Platelets: 239 10*3/uL (ref 150–400)
RBC: 3.39 MIL/uL — ABNORMAL LOW (ref 3.87–5.11)
RDW: 12.5 % (ref 11.5–15.5)
WBC: 16.5 10*3/uL — ABNORMAL HIGH (ref 4.0–10.5)
nRBC: 0 % (ref 0.0–0.2)

## 2022-09-06 LAB — BASIC METABOLIC PANEL
Anion gap: 9 (ref 5–15)
BUN: 20 mg/dL (ref 8–23)
CO2: 19 mmol/L — ABNORMAL LOW (ref 22–32)
Calcium: 7.8 mg/dL — ABNORMAL LOW (ref 8.9–10.3)
Chloride: 109 mmol/L (ref 98–111)
Creatinine, Ser: 0.58 mg/dL (ref 0.44–1.00)
GFR, Estimated: >60 mL/min (ref >60)
Glucose, Bld: 167 mg/dL — ABNORMAL HIGH (ref 70–99)
Potassium: 3.4 mmol/L — ABNORMAL LOW (ref 3.5–5.1)
Sodium: 137 mmol/L (ref 135–145)

## 2022-09-06 LAB — PROTIME-INR
INR: 1.2 (ref 0.8–1.2)
Prothrombin Time: 15.2 seconds (ref 11.4–15.2)

## 2022-09-06 LAB — APTT: aPTT: 35 seconds (ref 24–36)

## 2022-09-06 LAB — FOLATE: Folate: 27 ng/mL (ref >5.9)

## 2022-09-06 LAB — VITAMIN B12: Vitamin B-12: 446 pg/mL (ref 180–914)

## 2022-09-06 MED ORDER — AZITHROMYCIN 250 MG PO TABS: 250.0000 mg | ORAL_TABLET | Freq: Every day | ORAL | Status: DC

## 2022-09-06 MED ORDER — PREDNISONE 20 MG PO TABS: 20.0000 mg | ORAL_TABLET | Freq: Every day | ORAL | 0 refills | Status: AC

## 2022-09-06 MED ORDER — DILTIAZEM HCL ER COATED BEADS 120 MG PO CP24
120.0000 mg | ORAL_CAPSULE | Freq: Every day | ORAL | Status: DC
  Filled 2022-09-06: qty 1

## 2022-09-06 MED ORDER — PRAVASTATIN SODIUM 20 MG PO TABS
10.0000 mg | ORAL_TABLET | Freq: Every day | ORAL | Status: DC
  Administered 2022-09-06: 10 mg via ORAL
  Filled 2022-09-06: qty 1

## 2022-09-06 MED ORDER — CELECOXIB 200 MG PO CAPS: 200.0000 mg | ORAL_CAPSULE | Freq: Two times a day (BID) | ORAL | Status: DC | PRN

## 2022-09-06 MED ORDER — AZITHROMYCIN 250 MG PO TABS: ORAL_TABLET | ORAL | 0 refills | Status: DC

## 2022-09-06 MED ORDER — TRAMADOL HCL 50 MG PO TABS: 50.0000 mg | ORAL_TABLET | ORAL | Status: DC | PRN

## 2022-09-06 MED ORDER — ACETAMINOPHEN 650 MG RE SUPP: 650.0000 mg | Freq: Four times a day (QID) | RECTAL | Status: DC | PRN

## 2022-09-06 MED ORDER — ENOXAPARIN SODIUM 40 MG/0.4ML IJ SOSY
40.0000 mg | PREFILLED_SYRINGE | INTRAMUSCULAR | Status: DC
  Administered 2022-09-06: 40 mg via SUBCUTANEOUS
  Filled 2022-09-06: qty 0.4

## 2022-09-06 MED ORDER — SODIUM CHLORIDE 0.9% FLUSH: 3.0000 mL | Freq: Two times a day (BID) | INTRAVENOUS | Status: DC

## 2022-09-06 MED ORDER — ACETAMINOPHEN 325 MG PO TABS: 650.0000 mg | ORAL_TABLET | Freq: Four times a day (QID) | ORAL | Status: DC | PRN

## 2022-09-06 MED ORDER — POTASSIUM CHLORIDE CRYS ER 20 MEQ PO TBCR
40.0000 meq | EXTENDED_RELEASE_TABLET | Freq: Once | ORAL | Status: AC
  Administered 2022-09-06: 40 meq via ORAL
  Filled 2022-09-06: qty 2

## 2022-09-06 MED ORDER — PREDNISONE 20 MG PO TABS: 20.0000 mg | ORAL_TABLET | Freq: Every day | ORAL | Status: DC

## 2022-09-06 NOTE — Plan of Care (Signed)

## 2022-09-06 NOTE — TOC Initial Note (Signed)
Transition of Care [facility]) - Initial/Assessment Note    Patient Details  Name: Taylor Chambers MRN: [id number] Date of Birth: 05-10-1946  Transition of Care [facility]) CM/SW Contact:    Laurena Slimmer, RN Phone Number: 09/06/2022, 9:48 AM  Clinical Narrative:                  Transition of Care [facility]) Screening Note   Patient Details  Name: Taylor Chambers Date of Birth: 27-Dec-1945   Transition of Care [facility]) CM/SW Contact:    Laurena Slimmer, RN Phone Number: 09/06/2022, 9:48 AM    Transition of Care Department [facility]) has reviewed patient and no TOC needs have been identified at this time. We will continue to monitor patient advancement through interdisciplinary progression rounds. If new patient transition needs arise, please place a TOC consult.          Patient Goals and CMS Choice            Expected Discharge Plan and Services                                              Prior Living Arrangements/Services                       Activities of Daily Living Home Assistive Devices/Equipment: Oxygen ADL Screening (condition at time of admission) Patient's cognitive ability adequate to safely complete daily activities?: Yes Is the patient deaf or have difficulty hearing?: No Does the patient have difficulty seeing, even when wearing glasses/contacts?: No Does the patient have difficulty concentrating, remembering, or making decisions?: No Patient able to express need for assistance with ADLs?: Yes Does the patient have difficulty dressing or bathing?: No Independently performs ADLs?: Yes (appropriate for developmental age) Does the patient have difficulty walking or climbing stairs?: Yes Weakness of Legs: Both Weakness of Arms/Hands: None  Permission Sought/Granted                  Emotional Assessment              Admission diagnosis:  A-fib [I48.91] COPD exacerbation [J44.1] Atrial fibrillation with RVR [I48.91] Acute on chronic  respiratory failure with hypoxia [J96.21] Patient Active Problem List   Diagnosis Date Noted   Paroxysmal atrial fibrillation with RVR 09/05/2022   Leukocytosis 09/05/2022   Macrocytosis 09/05/2022   Lung nodule 01/12/2022   Myocardial injury 01/12/2022   Malnutrition of moderate degree 01/12/2022   COPD exacerbation 01/11/2022   Sepsis 01/11/2022   Acute respiratory failure with hypoxia 01/11/2022   Hx of total hip arthroplasty, 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 11/10/2021   H/O heartburn 06/24/2021   Centrilobular emphysema 02/05/2019   Tobacco use disorder 10/07/2013   Benign essential hypertension 10/07/2013   Symptomatic menopausal or female climacteric states 10/07/2013   PCP:  Leonel Ramsay, MD Pharmacy:   [facility] [place] [address] [place] [address] Phone: [phone] Fax: [phone]     Social Determinants of Health (SDOH) Social History: SDOH Screenings   Food Insecurity: No Food Insecurity (09/05/2022)  Housing: Low Risk  (09/05/2022)  Transportation Needs: No Transportation Needs (09/05/2022)  Utilities: Not At Risk (09/05/2022)  Tobacco Use: High Risk (09/05/2022)   SDOH Interventions:     Readmission Risk Interventions     No data to display

## 2022-09-06 NOTE — Care Management CC44 (Signed)
Condition Code 44 Documentation Completed  Patient Details  Name: Taylor Chambers MRN: [id number] Date of Birth: 1946-01-13   Condition Code 44 given:  Yes Patient signature on Condition Code 44 notice:  Yes Documentation of 2 MD's agreement:    Code 44 added to claim:  Yes    Laurena Slimmer, RN 09/06/2022, 11:16 AM

## 2022-09-06 NOTE — Care Management Important Message (Signed)
Important Message  Patient Details  Name: Chesney Golde MRN: [id number] Date of Birth: [birth year]   Medicare Important Message Given:  Yes     Laurena Slimmer, RN 09/06/2022, 11:16 AM

## 2022-09-06 NOTE — Discharge Summary (Addendum)
Physician Discharge Summary   Taylor Chambers: Taylor Chambers MRN: [id number] DOB: 09-29-1945  Admit date:     09/05/2022  Discharge date: 09/06/22  Discharge Physician: Fritzi Mandes   PCP: Leonel Ramsay, MD   Recommendations at discharge:    Use your oxygen, nebulizer and inhalers as before F/u Dr Lanney Gins in 7-10 days F/u PCP in 1-2 weeks  Discharge Diagnoses: Principal Problem:   Paroxysmal atrial fibrillation with RVR Active Problems:   COPD exacerbation   Tobacco use disorder   Leukocytosis   Macrocytosis   A-fib  Taylor Chambers is a 77 y.o. female with a history of COPD and hypertension who presents with increased shortness of breath since yesterday associated with cough productive of green and brown sputum.  [name] has intermittently felt hot but has not noted any fever. She felt worse after being outside on Easter Sunday. Taylor Chambers states that she is on 2 L of O2 by nasal cannula at home intermittently when needed.   COPD exacerbation/acute bronchitis -Moderate to severe in intensity given that Taylor Chambers is still tachypneic after initial Rx in the ER.  --viral panel is negative.   -Change to po zithromax and opo steroids for 4 days --cxr no PNA --pt feels back to baseline --no fever  Afib with rvr in the setting of COPD flare--resolved --pt has remained in SR. Was on po cardizem.  --has had echo 8/23 as out pt EF 45% --pt switched back to  her home bp meds. --she will f/u Cardiology dr callwood as outpt (no h/o Afib)   Macrocytosis Folate stable   Leukocytosis in the setting of steroids and bronchitis Blood cultures negative   Tobacco use disorder Smoking cessation discussion.  Overall improving. D/c home. Pt agreeable and requesting to go home. Husband at bedside       Pain control - [place] Controlled Substance Reporting System database was reviewed. and Taylor Chambers was instructed, not to drive, operate heavy machinery, perform activities at heights,  swimming or participation in water activities or provide baby-sitting services while on Pain, Sleep and Anxiety Medications; until their outpatient Physician has advised to do so again. Also recommended to not to take more than prescribed Pain, Sleep and Anxiety Medications.  Disposition: Home Diet recommendation:  Discharge Diet Orders (From admission, onward)     Start     Ordered   09/06/22 0000  Diet - low sodium heart healthy        04 /02/24 1005           Cardiac diet DISCHARGE MEDICATION: Allergies as of 09/06/2022       Reactions   Bupropion         Medication List     TAKE these medications    amLODipine 5 MG tablet Commonly known as: NORVASC Take 5 mg by mouth daily.   azithromycin 250 MG tablet Commonly known as: ZITHROMAX Take daily as instructed   benzonatate 200 MG capsule Commonly known as: TESSALON Take 1 capsule (200 mg total) by mouth 3 (three) times daily as needed for cough.   budesonide 0.25 MG/2ML nebulizer solution Commonly known as: PULMICORT Take 0.25 mg by nebulization 2 (two) times daily.   CALCIUM 500 PO Take by mouth.   celecoxib 200 MG capsule Commonly known as: CELEBREX Take 1 capsule (200 mg total) by mouth 2 (two) times daily.   Combivent Respimat 20-100 MCG/ACT Aers respimat Generic drug: Ipratropium-Albuterol Inhale 1 puff into the lungs every 6 (six) hours.   ipratropium-albuterol 0.5-2.5 (  3) MG/3ML Soln Commonly known as: DUONEB Take 3 mLs by nebulization 2 (two) times daily. Mix with budesonide   fluticasone 50 MCG/ACT nasal spray Commonly known as: FLONASE Place into both nostrils as needed.   furosemide 20 MG tablet Commonly known as: LASIX Take 20 mg by mouth daily.   losartan 100 MG tablet Commonly known as: COZAAR Take 100 mg by mouth daily.   omeprazole 40 MG capsule Commonly known as: PRILOSEC Take by mouth.   oxyCODONE 5 MG immediate release tablet Commonly known as: Oxy IR/ROXICODONE Take 1  tablet (5 mg total) by mouth every 4 (four) hours as needed for severe pain.   potassium chloride SA 20 MEQ tablet Commonly known as: KLOR-CON M Take 1 tablet by mouth daily.   pravastatin 10 MG tablet Commonly known as: PRAVACHOL Take 10 mg by mouth daily.   predniSONE 20 MG tablet Commonly known as: DELTASONE Take 1 tablet (20 mg total) by mouth daily with breakfast for 4 days. Start taking on: September 07, 2022 What changed: how much to take   PRESERVISION AREDS 2+MULTI VIT PO Take 2 tablets by mouth.   sertraline 100 MG tablet Commonly known as: ZOLOFT Take by mouth.   traMADol 50 MG tablet Commonly known as: ULTRAM Take 1 tablet (50 mg total) by mouth every 4 (four) hours as needed for moderate pain.   Vitamin D3 1.25 MG (50000 UT) capsule Generic drug: Cholecalciferol Take by mouth.        Follow-up Information     Leonel Ramsay, MD. Schedule an appointment as soon as possible for a visit in 1 week(s).   Specialty: Infectious Diseases Why: copd flare Contact information: [place] [address] [phone]         Ottie Glazier, MD. Call in 10 day(s).   Specialty: Pulmonary Disease Why: pt to call and make appt for f/u Contact information: [place] [address] [phone]                Filed Weights   09/05/22 1506 09/06/22 0042  Weight: 50.8 kg 50.9 kg     Condition at discharge: fair  The results of significant diagnostics from this hospitalization (including imaging, microbiology, ancillary and laboratory) are listed below for reference.   Imaging Studies: DG Chest Port 1 View  Result Date: 09/05/2022 CLINICAL DATA:  Provided history: Shortness of breath. EXAM: PORTABLE CHEST 1 VIEW COMPARISON:  Prior chest radiographs 01/11/2022 and earlier. FINDINGS: Heart size within normal limits. Aortic atherosclerosis. Pulmonary emphysema with chronic prominence of the interstitial lung  markings. Nipple sign 4 shadows project over the lung bases, bilaterally. No appreciable airspace consolidation. No evidence of pleural effusion or pneumothorax. No acute osseous abnormality identified. IMPRESSION: 1. No evidence of acute cardiopulmonary abnormality. 2. Emphysema (ICD10-J43.9) with chronic prominence of the interstitial lung markings. 3. Aortic Atherosclerosis (ICD10-I70.0). Electronically Signed   By: Kellie Simmering D.O.   On: 09/05/2022 15:54    Microbiology: Results for orders placed or performed during the hospital encounter of 09/05/22  Resp panel by RT-PCR (RSV, Flu A&B, Covid) Anterior Nasal Swab     Status: None   Collection Time: 09/05/22  4:12 PM   Specimen: Anterior Nasal Swab  Result Value Ref Range Status   SARS Coronavirus 2 by RT PCR NEGATIVE NEGATIVE Final    Comment: (NOTE) SARS-CoV-2 target nucleic acids are NOT DETECTED.  The SARS-CoV-2 RNA is generally detectable in upper respiratory specimens during the  acute phase of infection. The lowest concentration of SARS-CoV-2 viral copies this assay can detect is 138 copies/mL. A negative result does not preclude SARS-Cov-2 infection and should not be used as the sole basis for treatment or other Taylor Chambers management decisions. A negative result may occur with  improper specimen collection/handling, submission of specimen other than nasopharyngeal swab, presence of viral mutation(s) within the areas targeted by this assay, and inadequate number of viral copies(<138 copies/mL). A negative result must be combined with clinical observations, Taylor Chambers history, and epidemiological information. The expected result is Negative.  Fact Sheet for Patients:  EntrepreneurPulse.com.au  Fact Sheet for Healthcare Providers:  IncredibleEmployment.be  This test is no t yet approved or cleared by the [place] FDA and  has been authorized for detection and/or diagnosis of SARS-CoV-2 by FDA  under an Emergency Use Authorization (EUA). This EUA will remain  in effect (meaning this test can be used) for the duration of the COVID-19 declaration under Section 564(b)(1) of the Act, 21 U.S.C.section 360bbb-3(b)(1), unless the authorization is terminated  or revoked sooner.       Influenza A by PCR NEGATIVE NEGATIVE Final   Influenza B by PCR NEGATIVE NEGATIVE Final    Comment: (NOTE) The Xpert Xpress SARS-CoV-2/FLU/RSV plus assay is intended as an aid in the diagnosis of influenza from Nasopharyngeal swab specimens and should not be used as a sole basis for treatment. Nasal washings and aspirates are unacceptable for Xpert Xpress SARS-CoV-2/FLU/RSV testing.  Fact Sheet for Patients: EntrepreneurPulse.com.au  Fact Sheet for Healthcare Providers: IncredibleEmployment.be  This test is not yet approved or cleared by the [place] FDA and has been authorized for detection and/or diagnosis of SARS-CoV-2 by FDA under an Emergency Use Authorization (EUA). This EUA will remain in effect (meaning this test can be used) for the duration of the COVID-19 declaration under Section 564(b)(1) of the Act, 21 U.S.C. section 360bbb-3(b)(1), unless the authorization is terminated or revoked.     Resp Syncytial Virus by PCR NEGATIVE NEGATIVE Final    Comment: (NOTE) Fact Sheet for Patients: EntrepreneurPulse.com.au  Fact Sheet for Healthcare Providers: IncredibleEmployment.be  This test is not yet approved or cleared by the [place] FDA and has been authorized for detection and/or diagnosis of SARS-CoV-2 by FDA under an Emergency Use Authorization (EUA). This EUA will remain in effect (meaning this test can be used) for the duration of the COVID-19 declaration under Section 564(b)(1) of the Act, 21 U.S.C. section 360bbb-3(b)(1), unless the authorization is terminated or revoked.  Performed at [facility], [address] [place], [place] [address]   Culture, blood (Routine X 2) w Reflex to ID Panel     Status: None (Preliminary result)   Collection Time: 09/05/22  8:58 PM   Specimen: BLOOD  Result Value Ref Range Status   Specimen Description BLOOD BLOOD RIGHT ARM  Final   Special Requests   Final    BOTTLES DRAWN AEROBIC AND ANAEROBIC Blood Culture adequate volume   Culture   Final    NO GROWTH < 12 HOURS Performed at [facility], [address] [place], [place] [address]    Report Status PENDING  Incomplete  Culture, blood (Routine X 2) w Reflex to ID Panel     Status: None (Preliminary result)   Collection Time: 09/05/22  8:58 PM   Specimen: BLOOD  Result Value Ref Range Status   Specimen Description BLOOD BLOOD LEFT ARM  Final   Special Requests   Final  BOTTLES DRAWN AEROBIC AND ANAEROBIC Blood Culture adequate volume   Culture   Final    NO GROWTH < 12 HOURS Performed at [facility], [address] [place], [place] [address]    Report Status PENDING  Incomplete    Labs: CBC: Recent Labs  Lab 09/05/22 1511 09/06/22 0324  WBC 20.7* 16.5*  HGB 14.0 11.3*  HCT 42.3 33.7*  MCV 101.0* 99.4  PLT 303 [id number]   Basic Metabolic Panel: Recent Labs  Lab 09/05/22 1511 09/05/22 1716 09/06/22 0324  NA 136  --  137  K 3.8  --  3.4*  CL 103  --  109  CO2 19*  --  19*  GLUCOSE 128*  --  167*  BUN 10  --  20  CREATININE 0.62  --  0.58  CALCIUM 8.8*  --  7.8*  MG  --  2.1  --    Liver Function Tests: No results for input(s): "AST", "ALT", "ALKPHOS", "BILITOT", "PROT", "ALBUMIN" in the last 168 hours. CBG: Recent Labs  Lab 09/05/22 2250  GLUCAP 180*    Discharge time spent: greater than 30 minutes.  Signed: Fritzi Mandes, MD [facility] 09/06/2022

## 2022-09-06 NOTE — Discharge Instructions (Signed)
Use your oxygen, nebulizer and inhalers as before

## 2022-09-07 LAB — HEMOGLOBIN A1C
Hgb A1c MFr Bld: 5.4 % (ref 4.8–5.6)
Mean Plasma Glucose: 108 mg/dL

## 2022-09-07 LAB — THYROID PANEL WITH TSH
Free Thyroxine Index: 2.3 (ref 1.2–4.9)
T3 Uptake Ratio: 30 % (ref 24–39)
T4, Total: 7.6 ug/dL (ref 4.5–12.0)
TSH: 1.22 u[IU]/mL (ref 0.450–4.500)

## 2022-09-10 LAB — CULTURE, BLOOD (ROUTINE X 2)
Culture: NO GROWTH
Culture: NO GROWTH
Special Requests: ADEQUATE
Special Requests: ADEQUATE

## 2022-10-16 ENCOUNTER — Emergency Department: Payer: Medicare HMO

## 2022-10-16 ENCOUNTER — Other Ambulatory Visit: Payer: Self-pay

## 2022-10-16 ENCOUNTER — Emergency Department
Admission: EM | Admit: 2022-10-16 | Discharge: 2022-10-16 | Disposition: A | Discharge status: Home / Self Care | Payer: Medicare HMO | Attending: Emergency Medicine | Admitting: Emergency Medicine

## 2022-10-16 DIAGNOSIS — M25521 Pain in right elbow: Secondary | ICD-10-CM | POA: Diagnosis present

## 2022-10-16 DIAGNOSIS — W19XXXA Unspecified fall, initial encounter: Secondary | ICD-10-CM | POA: Insufficient documentation

## 2022-10-16 MED ORDER — ONDANSETRON 4 MG PO TBDP: 4.0000 mg | ORAL_TABLET | Freq: Three times a day (TID) | ORAL | 0 refills | Status: AC | PRN

## 2022-10-16 MED ORDER — HYDROCODONE-ACETAMINOPHEN 5-325 MG PO TABS
1.0000 | ORAL_TABLET | Freq: Once | ORAL | Status: AC
  Administered 2022-10-16: 1 via ORAL
  Filled 2022-10-16: qty 1

## 2022-10-16 MED ORDER — HYDROCODONE-ACETAMINOPHEN 5-325 MG PO TABS: 1.0000 | ORAL_TABLET | Freq: Four times a day (QID) | ORAL | 0 refills | Status: AC | PRN

## 2022-10-16 MED ORDER — ONDANSETRON 4 MG PO TBDP
4.0000 mg | ORAL_TABLET | Freq: Once | ORAL | Status: AC
  Administered 2022-10-16: 4 mg via ORAL
  Filled 2022-10-16: qty 1

## 2022-10-16 NOTE — ED Provider Notes (Signed)
[facility] Provider Note  Patient Contact: 8:15 PM (approximate)   History   Elbow Pain   HPI  Taylor Chambers is a 77 y.o. female presents to the emergency department with acute right elbow pain after patient had a mechanical fall.  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  She denies hitting her head or her neck.  No similar injuries in the past.      Physical Exam   Triage Vital Signs: ED Triage Vitals  Enc Vitals Group     BP 10/16/22 1936 (!) 159/77     Pulse Rate 10/16/22 1936 82     Resp 10/16/22 1936 18     Temp 10/16/22 1936 98.1 F (36.7 C)     Temp src --      SpO2 10/16/22 1936 95 %     Weight 10/16/22 1937 110 lb (49.9 kg)     Height 10/16/22 1937 5\' 2"  (1.575 m)     Head Circumference --      Peak Flow --      Pain Score 10/16/22 1937 8     Pain Loc --      Pain Edu? --      Excl. in GC? --     Most recent vital signs: Vitals:   10/16/22 1936  BP: (!) 159/77  Pulse: 82  Resp: 18  Temp: 98.1 F (36.7 C)  SpO2: 95%     General: Alert and in no acute distress. Eyes:  PERRL. EOMI. Head: No acute traumatic findings ENT:      Nose: No congestion/rhinnorhea.      Mouth/Throat: Mucous membranes are moist. Neck: No stridor. No cervical spine tenderness to palpation. Cardiovascular:  Good peripheral perfusion Respiratory: Normal respiratory effort without tachypnea or retractions. Lungs CTAB. Good air entry to the bases with no decreased or absent breath sounds. Gastrointestinal: Bowel sounds 4 quadrants. Soft and nontender to palpation. No guarding or rigidity. No palpable masses. No distention. No CVA tenderness. Musculoskeletal: Patient has difficulty performing range of motion at right elbow.  Palpable radial and ulnar pulses bilaterally and symmetrically. Neurologic:  No gross focal neurologic deficits are appreciated.  Skin: Capillary refill less than 2  seconds on the right.    ED Results / Procedures / Treatments   Labs (all labs ordered are listed, but only abnormal results are displayed) Labs Reviewed - No data to display     RADIOLOGY  I personally viewed and evaluated these images as part of my medical decision making, as well as reviewing the written report by the radiologist.  ED Provider Interpretation: No acute abnormality on x-ray of the right forearm and right elbow.   PROCEDURES:  Critical Care performed: No  Procedures   MEDICATIONS ORDERED IN ED: Medications  HYDROcodone-acetaminophen (NORCO/VICODIN) 5-325 MG per tablet 1 tablet (has no administration in time range)  ondansetron (ZOFRAN-ODT) disintegrating tablet 4 mg (has no administration in time range)     IMPRESSION / MDM / ASSESSMENT AND PLAN / ED COURSE  I reviewed the triage vital signs and the nursing notes.                              Assessment and plan Fall 77 year old female presents to the emergency department after she had a mechanical fall at a wedding.  Patient was hypertensive at triage  but vital signs otherwise reassuring.  On exam, patient was alert and nontoxic-appearing.  She had difficulty with pronation and supination.  I reviewed x-rays of the right elbow and right forearm which showed no acute abnormalities.  Patient was placed in a sling for comfort and was prescribed a short course of Norco for pain.      FINAL CLINICAL IMPRESSION(S) / ED DIAGNOSES   Final diagnoses:  Right elbow pain     Rx / DC Orders   ED Discharge Orders     None        Note:  This document was prepared using Dragon voice recognition software and may include unintentional dictation errors.   Pia Mau [place], PA-C 10/16/22 2018    Georga Hacking, MD 10/17/22 [phone]

## 2022-10-16 NOTE — ED Triage Notes (Signed)
Pt reports mechanical fall while carrying a wedding cake at a wedding. States landed on R elbow on concrete. Denies hitting head or LOC. Reports pain is in R elbow joint and radiates to wrist. Sensation intact. Peripheral pulses palpable. Denies pain elsewhere. Ambulatory to triage with independent and steady gait. Alert and oriented following commands. Breathing unlabored speaking in full sentences with symmetric chest rise and fall. No obvious bruising or deformity noted.

## 2022-10-16 NOTE — Discharge Instructions (Addendum)
Please make follow-up appointment with orthopedics, Dr. Allena Katz. You have been prescribed a short course of Norco for pain.

## 2022-12-13 ENCOUNTER — Encounter: Payer: Self-pay | Admitting: Cardiovascular Disease

## 2022-12-13 ENCOUNTER — Ambulatory Visit: Payer: Medicare HMO | Attending: Cardiovascular Disease | Admitting: Cardiovascular Disease

## 2022-12-13 ENCOUNTER — Ambulatory Visit (INDEPENDENT_AMBULATORY_CARE_PROVIDER_SITE_OTHER): Payer: Medicare HMO

## 2022-12-13 VITALS — BP 124/62 | HR 90 | Ht 62.0 in | Wt 105.4 lb

## 2022-12-13 DIAGNOSIS — R002 Palpitations: Secondary | ICD-10-CM

## 2022-12-13 DIAGNOSIS — I429 Cardiomyopathy, unspecified: Secondary | ICD-10-CM

## 2022-12-13 DIAGNOSIS — I480 Paroxysmal atrial fibrillation: Secondary | ICD-10-CM

## 2022-12-13 DIAGNOSIS — I10 Essential (primary) hypertension: Secondary | ICD-10-CM

## 2022-12-13 MED ORDER — APIXABAN 5 MG PO TABS: 5.0000 mg | ORAL_TABLET | Freq: Two times a day (BID) | ORAL | 3 refills | Status: DC

## 2022-12-13 NOTE — Patient Instructions (Signed)
Medication Instructions:  START Eliquis 5 mg twice daily   *If you need a refill on your cardiac medications before your next appointment, please call your pharmacy*   Lab Work: None ordered If you have labs (blood work) drawn today and your tests are completely normal, you will receive your results only by: MyChart Message (if you have MyChart) OR A paper copy in the mail If you have any lab test that is abnormal or we need to change your treatment, we will call you to review the results.   Testing/Procedures: Your physician has requested that you have an echocardiogram. Echocardiography is a painless test that uses sound waves to create images of your heart. It provides your doctor with information about the size and shape of your heart and how well your heart's chambers and valves are working.   You may receive an ultrasound enhancing agent through an IV if needed to better visualize your heart during the echo. This procedure takes approximately one hour.  There are no restrictions for this procedure.  This will take place at [address] [facility], [place] [address]    Follow-Up: At [facility], you and your health needs are our priority.  As part of our continuing mission to provide you with exceptional heart care, we have created designated Provider Care Teams.  These Care Teams include your primary Cardiologist (physician) and Advanced Practice Providers (APPs -  Physician Assistants and Nurse Practitioners) who all work together to provide you with the care you need, when you need it.  We recommend signing up for the patient portal called "MyChart".  Sign up information is provided on this After Visit Summary.  MyChart is used to connect with patients for Virtual Visits (Telemedicine).  Patients are able to view lab/test results, encounter notes, upcoming appointments, etc.  Non-urgent messages can be sent to your provider as well.   To learn  more about what you can do with MyChart, go to [email or web address].    Your next appointment:   2 month(s)  Provider:   You may see Dr. Kirke Corin or one of the following Advanced Practice Providers on your designated Care Team:   Nicolasa Ducking, NP Eula Listen, PA-C Cadence Fransico Michael, PA-C Charlsie Quest, NP    Other Instructions Christena Deem- Long Term Monitor Instructions  Your physician has requested you wear a ZIO patch monitor for 14 days.  This is a single patch monitor. [employer] supplies one patch monitor per enrollment. Additional stickers are not available. Please do not apply patch if you will be having a Nuclear Stress Test,  Echocardiogram, Cardiac CT, MRI, or Chest Xray during the period you would be wearing the  monitor. The patch cannot be worn during these tests. You cannot remove and re-apply the  ZIO XT patch monitor.  Your ZIO patch monitor will be mailed 3 day USPS to your address on file. It may take 3-5 days  to receive your monitor after you have been enrolled.  Once you have received your monitor, please review the enclosed instructions. Your monitor  has already been registered assigning a specific monitor serial # to you.  Billing and Patient Assistance Program Information  We have supplied [employer] with any of your insurance information on file for billing purposes. [employer] offers a sliding scale Patient Assistance Program for patients that do not have  insurance, or whose insurance does not completely cover the cost of the ZIO monitor.  You must apply for the  Patient Assistance Program to qualify for this discounted rate.  To apply, please call [employer] at [phone], select option 4, select option 2, ask to apply for  Patient Assistance Program. Meredeth Ide will ask your household income, and how many people  are in your household. They will quote your out-of-pocket cost based on that information.  [employer] will also be able to set up a 63-month, interest-free payment  plan if needed.  Applying the monitor   Shave hair from upper left chest.  Hold abrader disc by orange tab. Rub abrader in 40 strokes over the upper left chest as  indicated in your monitor instructions.  Clean area with 4 enclosed alcohol pads. Let dry.  Apply patch as indicated in monitor instructions. Patch will be placed under collarbone on left  side of chest with arrow pointing upward.  Rub patch adhesive wings for 2 minutes. Remove white label marked "1". Remove the white  label marked "2". Rub patch adhesive wings for 2 additional minutes.  While looking in a mirror, press and release button in center of patch. A small green light will  flash 3-4 times. This will be your only indicator that the monitor has been turned on.  Do not shower for the first 24 hours. You may shower after the first 24 hours.  Press the button if you feel a symptom. You will hear a small click. Record Date, Time and  Symptom in the Patient Logbook.  When you are ready to remove the patch, follow instructions on the last 2 pages of Patient  Logbook. Stick patch monitor onto the last page of Patient Logbook.  Place Patient Logbook in the blue and white box. Use locking tab on box and tape box closed  securely. The blue and white box has prepaid postage on it. Please place it in the mailbox as  soon as possible. Your physician should have your test results approximately 7 days after the  monitor has been mailed back to [facility].  Call [facility] Customer Care at [phone] if you have questions regarding  your ZIO XT patch monitor. Call them immediately if you see an orange light blinking on your  monitor.  If your monitor falls off in less than 4 days, contact our Monitor department at [phone].  If your monitor becomes loose or falls off after 4 days call [employer] at [phone] for  suggestions on securing your monitor

## 2022-12-13 NOTE — Progress Notes (Signed)
Cardiology Office Note   Date:  12/16/2022   ID:  Taylor Chambers, DOB 03/25/1946, MRN [id number]  PCP:  Mick Sell, MD  Cardiologist:   Lorine Bears, MD   Chief Complaint  Patient presents with   New Patient (Initial Visit)    CAD/CHF c/o sob. Meds reviewed verbally with pt.      History of Present Illness: Taylor Chambers is a 77 y.o. female who presents to establish cardiovascular care. She has past medical history of nonobstructive coronary artery disease on CT scan, essential hypertension, hyperlipidemia, COPD, bipolar disorder and previous tobacco use. She was seen by Dr. Juliann Pares.  She had an echocardiogram done in August 2023 which showed an EF of 45% with moderate mitral regurgitation. She had cardiac CTA done in November 2023 which showed a calcium score of 95 with mild nonobstructive coronary artery disease. She was hospitalized in April with COPD exasperation and was noted to be in A-fib with RVR on presentation.  She converted to sinus rhythm with diltiazem.  She was not discharged on anticoagulation. She quit smoking last year. She has been doing reasonably well but does complain of occasional exertional chest pain and shortness of breath.  She also complains of frequent palpitations especially with exertion.  Past Medical History:  Diagnosis Date   Allergies    Anxiety    Aortic atherosclerosis (HCC)    Bipolar affective disorder (HCC)    COPD (chronic obstructive pulmonary disease) (HCC)    Coronary artery calcification seen on CT scan    Depression    Diastolic dysfunction 02/24/2020   a.) TTE 02/24/2020: EF >55%; triv PR, mild TR, mod MR; G1DD.   DOE (dyspnea on exertion)    Emphysema lung (HCC)    History of cataract    HLD (hyperlipidemia)    Hypertension    Macular degeneration    Migraines    Osteoporosis    Pneumonia    Tobacco use     Past Surgical History:  Procedure Laterality Date   CATARACT EXTRACTION     COLONOSCOPY N/A 08/30/2021    Procedure: COLONOSCOPY;  Surgeon: Jaynie Collins, DO;  Location: [facility] ENDOSCOPY;  Service: Gastroenterology;  Laterality: N/A;   DILATION AND CURETTAGE OF UTERUS     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   Procedure: TOTAL HIP ARTHROPLASTY;  Surgeon: Donato Heinz, MD;  Location: [facility] ORS;  Service: Orthopedics;  Laterality: Right;   WISDOM TOOTH EXTRACTION     WRIST GANGLION EXCISION       Current Outpatient Medications  Medication Sig Dispense Refill   amLODipine (NORVASC) 5 MG tablet Take 5 mg by mouth daily.     apixaban (ELIQUIS) 5 MG TABS tablet Take 1 tablet (5 mg total) by mouth 2 (two) times daily. 60 tablet 3   azithromycin (ZITHROMAX) 250 MG tablet Take daily as instructed 4 each 0   budesonide (PULMICORT) 0.25 MG/2ML nebulizer solution Take 0.25 mg by nebulization 2 (two) times daily.     Calcium Carbonate (CALCIUM 500 PO) Take by mouth daily at 12 noon.     celecoxib (CELEBREX) 200 MG capsule Take 1 capsule (200 mg total) by mouth 2 (two) times daily. 90 capsule 0   Cholecalciferol (VITAMIN D3) 1.25 MG (50000 UT) CAPS Take by mouth daily in the afternoon.     fluticasone (FLONASE) 50 MCG/ACT nasal spray Place into both nostrils as needed.     ipratropium-albuterol (DUONEB) 0.5-2.5 (3) MG/3ML SOLN Take 3 mLs by nebulization  2 (two) times daily. Mix with budesonide     losartan (COZAAR) 100 MG tablet Take 100 mg by mouth daily.     Multiple Vitamins-Minerals (PRESERVISION AREDS 2+MULTI VIT PO) Take 2 tablets by mouth.     omeprazole (PRILOSEC) 40 MG capsule Take 40 mg by mouth as needed.     pravastatin (PRAVACHOL) 10 MG tablet Take 10 mg by mouth daily.     predniSONE (DELTASONE) 5 MG tablet Take 5 mg by mouth daily with breakfast.     sertraline (ZOLOFT) 100 MG tablet Take 50 mg by mouth daily.     traMADol (ULTRAM) 50 MG tablet Take 1 tablet (50 mg total) by mouth every 4 (four) hours as needed for moderate pain. 30 tablet 0   benzonatate (TESSALON) 200 MG capsule  Take 1 capsule (200 mg total) by mouth 3 (three) times daily as needed for cough. (Patient not taking: Reported on 12/13/2022) 20 capsule 0   furosemide (LASIX) 20 MG tablet Take 20 mg by mouth daily. (Patient not taking: Reported on 12/13/2022)     Ipratropium-Albuterol (COMBIVENT RESPIMAT) 20-100 MCG/ACT AERS respimat Inhale 1 puff into the lungs every 6 (six) hours. (Patient not taking: Reported on 12/13/2022)     oxyCODONE (OXY IR/ROXICODONE) 5 MG immediate release tablet Take 1 tablet (5 mg total) by mouth every 4 (four) hours as needed for severe pain. (Patient not taking: Reported on 12/13/2022) 30 tablet 0   potassium chloride SA (KLOR-CON M) 20 MEQ tablet Take 1 tablet by mouth daily. (Patient not taking: Reported on 12/13/2022)     No current facility-administered medications for this visit.    Allergies:   Bupropion    Social History:  The patient  reports that she quit smoking about 11 months ago. Her smoking use included cigarettes. She started smoking about 50 years ago. She has a 12.5 pack-year smoking history. She has never used smokeless tobacco. She reports that she does not drink alcohol and does not use drugs.   Family History:  The patient's family history includes Breast cancer (age of onset: 13) in her mother; Heart Problems in her maternal uncle, paternal aunt, and paternal uncle.    ROS:  Please see the history of present illness.   Otherwise, review of systems are positive for none.   All other systems are reviewed and negative.    PHYSICAL EXAM: VS:  BP 124/62 (BP Location: Right Arm, Patient Position: Sitting, Cuff Size: Normal)   Pulse 90   Ht 5\' 2"  (1.575 m)   Wt 105 lb 6 oz (47.8 kg)   SpO2 98%   BMI 19.27 kg/m  , BMI Body mass index is 19.27 kg/m. GEN: Well nourished, well developed, in no acute distress  HEENT: normal  Neck: no JVD, carotid bruits, or masses Cardiac: RRR; no murmurs, rubs, or gallops,no edema  Respiratory:  clear to auscultation bilaterally,  normal work of breathing GI: soft, nontender, nondistended, + BS MS: no deformity or atrophy  Skin: warm and dry, no rash Neuro:  Strength and sensation are intact Psych: euthymic mood, full affect   EKG:  EKG is ordered today. The ekg ordered today demonstrates : Sinus rhythm with Premature supraventricular complexes       Recent Labs: 01/11/2022: ALT 24; B Natriuretic Peptide 94.0 09/05/2022: Magnesium 2.1 09/06/2022: BUN 20; Creatinine, Ser 0.58; Hemoglobin 11.3; Platelets 239; Potassium 3.4; Sodium 137; TSH 1.220    Lipid Panel No results found for: "CHOL", "TRIG", "HDL", "CHOLHDL", "VLDL", "LDLCALC", "  LDLDIRECT"    Wt Readings from Last 3 Encounters:  12/13/22 105 lb 6 oz (47.8 kg)  10/16/22 110 lb (49.9 kg)  09/06/22 112 lb 3.4 oz (50.9 kg)          12/13/2022    2:39 PM  PAD Screen  Previous PAD dx? No  Previous surgical procedure? Yes  Pain with walking? Yes  Subsides with rest? Yes  Feet/toe relief with dangling? No  Painful, non-healing ulcers? No  Extremities discolored? No      ASSESSMENT AND PLAN:  1.  Paroxysmal atrial fibrillation: She is in this rhythm today.  CHA2DS2-VASc score is at least 4 and that she is at high risk for cardioembolic complications.  I recommend anticoagulation.  I started Eliquis 5 mg twice daily. Given frequent palpitations, I requested a 2 weeks ZIO monitor to evaluate her burden and see if we need to switch her medications.  2.  Cardiomyopathy: Her EF was mildly reduced last year.  I requested a follow-up echocardiogram.  If her EF is low, she will require a right and left cardiac catheterization.  3.  Essential hypertension: Blood pressure is controlled but her medications might need to be adjusted based on her cardiac testing.    Disposition:   FU with me in 2 months  Signed,  Lorine Bears, MD  12/16/2022 10:59 AM    [facility]

## 2022-12-16 DIAGNOSIS — R002 Palpitations: Secondary | ICD-10-CM

## 2022-12-16 DIAGNOSIS — I480 Paroxysmal atrial fibrillation: Secondary | ICD-10-CM | POA: Diagnosis not present

## 2023-01-05 ENCOUNTER — Telehealth: Payer: Self-pay | Admitting: Cardiovascular Disease

## 2023-01-05 NOTE — Telephone Encounter (Signed)
Received fax for ZIO monitor results Printed and handed to nurse to review

## 2023-01-06 ENCOUNTER — Other Ambulatory Visit: Payer: Self-pay

## 2023-01-06 MED ORDER — METOPROLOL SUCCINATE ER 50 MG PO TB24: 50.0000 mg | ORAL_TABLET | Freq: Every day | ORAL | 3 refills | Status: DC

## 2023-01-06 NOTE — Telephone Encounter (Signed)
Follow Up:   Calling to see if you received the results on this patient's Zio Monitor.

## 2023-01-06 NOTE — Telephone Encounter (Signed)
Per nurse Debbe Odea who received report yesterday, results in MD's basket for review.   Zio company made aware.

## 2023-01-10 ENCOUNTER — Other Ambulatory Visit: Payer: Medicare HMO

## 2023-01-12 ENCOUNTER — Ambulatory Visit
Admission: RE | Admit: 2023-01-12 | Discharge: 2023-01-12 | Disposition: A | Discharge status: Home / Self Care | Payer: Medicare HMO | Source: Ambulatory Visit | Attending: Acute Care | Admitting: Acute Care

## 2023-01-12 DIAGNOSIS — F17210 Nicotine dependence, cigarettes, uncomplicated: Secondary | ICD-10-CM | POA: Insufficient documentation

## 2023-01-12 DIAGNOSIS — Z87891 Personal history of nicotine dependence: Secondary | ICD-10-CM | POA: Diagnosis present

## 2023-01-12 DIAGNOSIS — Z122 Encounter for screening for malignant neoplasm of respiratory organs: Secondary | ICD-10-CM | POA: Diagnosis present

## 2023-01-17 ENCOUNTER — Other Ambulatory Visit: Payer: Self-pay | Admitting: Acute Care

## 2023-01-17 DIAGNOSIS — Z122 Encounter for screening for malignant neoplasm of respiratory organs: Secondary | ICD-10-CM

## 2023-01-17 DIAGNOSIS — Z87891 Personal history of nicotine dependence: Secondary | ICD-10-CM

## 2023-01-26 ENCOUNTER — Ambulatory Visit: Payer: Medicare HMO | Attending: Cardiovascular Disease

## 2023-01-26 DIAGNOSIS — I081 Rheumatic disorders of both mitral and tricuspid valves: Secondary | ICD-10-CM | POA: Diagnosis not present

## 2023-01-26 DIAGNOSIS — I480 Paroxysmal atrial fibrillation: Secondary | ICD-10-CM | POA: Diagnosis not present

## 2023-01-26 LAB — ECHOCARDIOGRAM COMPLETE
Area-P 1/2: 3.72 cm2
S' Lateral: 3.5 cm

## 2023-03-10 ENCOUNTER — Ambulatory Visit: Payer: Medicare HMO | Admitting: Cardiovascular Disease

## 2023-03-23 ENCOUNTER — Ambulatory Visit: Payer: Medicare HMO | Admitting: Physician Assistant

## 2023-04-07 NOTE — H&P (View-Only) (Signed)
 Cardiology Office Note    Date:  04/14/2023   ID:  Taylor Chambers, DOB 06/04/1946, MRN [id number]  PCP:  Mick Sell, MD  Cardiologist:  Lorine Bears, MD  Electrophysiologist:  None   Chief Complaint: Follow-up  History of Present Illness:   Taylor Chambers is a 77 y.o. female with history of nonobstructive CAD by coronary CTA in 04/2022, HFimpEF, PAF diagnosed in 09/2022, moderate to severe mitral regurgitation, HTN, HLD, bipolar disorder, COPD with prior tobacco use quitting in 2023 who presents for follow-up of Zio patch and echo.  She was previously followed by Dr. Juliann Pares, establishing care with Dr. Kirke Corin in 12/2022.  Echo in 02/2020 showed an EF greater than 55%, normal wall motion, grade 1 diastolic dysfunction, normal RV systolic function, moderate mitral regurgitation, and mild to moderate tricuspid regurgitation.  Lexiscan MPI in 02/2020 showed no evidence of ischemia with an EF of 64%.  Echo in 01/2022 showed an EF of 45% with moderate mitral regurgitation.  Coronary CTA in 04/2022 showed a calcium score of 95, which was the 59th percentile.  There was 25% proximal LCx stenosis with less than 25% proximal RCA stenosis.  Noncardiac overread showed no acute extracardiac findings with resolution of tree-in-bud nodules when compared to prior study as well as a solid pulmonary nodules in the right lower lobe that were unchanged when compared to study from 07/2021 with recommendation for annual follow-up (patient already undergoing lung cancer screening CTs).  She was admitted to the hospital in 09/2022 with COPD exacerbation and was noted to be in A-fib with RVR on presentation.  She converted to sinus rhythm with diltiazem.  She was not discharged on anticoagulation.  Upon establishing care with our office in 12/2022, she did note occasional exertional chest pain and shortness of breath as well as frequent palpitations, particularly with exertion.  She was in sinus rhythm with PACs.  To further  evaluate palpitation burden, she underwent a Zio patch that showed a predominant rhythm of sinus with an average rate of 84 bpm (range 56 to 214 bpm), 106 episodes of SVT lasting up to 2 minutes and 10 seconds, 1% A-fib/flutter burden with ventricular rates ranging from 119 to 213 bpm with an average ventricular rate of 153 bpm and the longest episode lasting 2 hours and 4 minutes.  Echo in 01/2023 showed an EF of 55 to 60%, no regional wall motion abnormalities, normal RV systolic function, ventricular cavity size, and RVSP, moderate to severe mitral valve regurgitation with mild to late systolic prolapse of multiple segments of the anterior leaflet of the mitral valve, mild to moderate tricuspid regurgitation, and an estimated right atrial pressure of 3 mmHg.  She comes in doing well from a cardiac perspective and is without symptoms of angina or cardiac decompensation.  Evaluated by PCP's office yesterday for bronchitis and is currently on a prednisone taper and azithromycin.  With this, she notes an improvement in symptoms.  Not currently requiring supplemental oxygen 24/7, uses this when driving and sometimes at night.  No palpitations, dizziness, presyncope, or syncope.  No falls, hematochezia, or melena.  No significant lower extremity swelling or progressive orthopnea.  Adherent and tolerating cardiac medications without off target effects.   Labs independently reviewed: 03/2023 - potassium 4.1, BUN 19, serum creatinine 0.8, Hgb 15.1, PLT 284 09/2022 - TSH normal, magnesium 2.1, A1c 5.4 06/2022 - albumin 4.4, AST/ALT normal 03/2021 - TC 160, TG 65, HDL 77, LDL 70  Past Medical History:  Diagnosis  Date   Allergies    Anxiety    Aortic atherosclerosis (HCC)    Bipolar affective disorder (HCC)    COPD (chronic obstructive pulmonary disease) (HCC)    Coronary artery calcification seen on CT scan    Depression    Diastolic dysfunction 02/24/2020   a.) TTE 02/24/2020: EF >55%; triv PR, mild TR,  mod MR; G1DD.   DOE (dyspnea on exertion)    Emphysema lung (HCC)    History of cataract    HLD (hyperlipidemia)    Hypertension    Macular degeneration    Migraines    Osteoporosis    Pneumonia    Tobacco use     Past Surgical History:  Procedure Laterality Date   CATARACT EXTRACTION     COLONOSCOPY N/A 08/30/2021   Procedure: COLONOSCOPY;  Surgeon: Jaynie Collins, DO;  Location: [facility] ENDOSCOPY;  Service: Gastroenterology;  Laterality: N/A;   DILATION AND CURETTAGE OF UTERUS     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   Procedure: TOTAL HIP ARTHROPLASTY;  Surgeon: Donato Heinz, MD;  Location: [facility] ORS;  Service: Orthopedics;  Laterality: Right;   WISDOM TOOTH EXTRACTION     WRIST GANGLION EXCISION      Current Medications: Current Meds  Medication Sig   apixaban (ELIQUIS) 5 MG TABS tablet Take 1 tablet (5 mg total) by mouth 2 (two) times daily.   Calcium Carbonate (CALCIUM 500 PO) Take by mouth daily at 12 noon.   Cholecalciferol (VITAMIN D3) 1.25 MG (50000 UT) CAPS Take by mouth daily in the afternoon.   Ipratropium-Albuterol (COMBIVENT RESPIMAT) 20-100 MCG/ACT AERS respimat Inhale 1 puff into the lungs every 6 (six) hours.   ipratropium-albuterol (DUONEB) 0.5-2.5 (3) MG/3ML SOLN Take 3 mLs by nebulization 2 (two) times daily. Mix with budesonide   losartan (COZAAR) 100 MG tablet Take 100 mg by mouth daily.   metoprolol succinate (TOPROL-XL) 50 MG 24 hr tablet Take 1 tablet (50 mg total) by mouth daily. Take with or immediately following a meal.   Multiple Vitamins-Minerals (PRESERVISION AREDS 2+MULTI VIT PO) Take 2 tablets by mouth.   omeprazole (PRILOSEC) 40 MG capsule Take 40 mg by mouth as needed.   pravastatin (PRAVACHOL) 10 MG tablet Take 10 mg by mouth daily.   predniSONE (DELTASONE) 10 MG tablet 6 tabs x 1 day, 5 tabs x 1 day, 4 tabs x 1 day, etc...   sertraline (ZOLOFT) 100 MG tablet Take 50 mg by mouth daily.    Allergies:   Bupropion   Social History    Socioeconomic History   Marital status: Married    Spouse name: Taylor Chambers   Number of children: Not on file   Years of education: Not on file   Highest education level: Not on file  Occupational History   Not on file  Tobacco Use   Smoking status: Former    Current packs/day: 0.00    Average packs/day: 0.3 packs/day for 50.0 years (12.5 ttl pk-yrs)    Types: Cigarettes    Start date: 01/10/1972    Quit date: 01/09/2022    Years since quitting: 1.2   Smokeless tobacco: Never  Vaping Use   Vaping status: Never Used  Substance and Sexual Activity   Alcohol use: Never   Drug use: Never   Sexual activity: Not on file  Other Topics Concern   Not on file  Social History Narrative   Not on file   Social Determinants of Health   Financial Resource Strain: Low  Risk  (03/02/2023)   Received from [facility]   Overall Financial Resource Strain (CARDIA)    Difficulty of Paying Living Expenses: Not hard at all  Food Insecurity: No Food Insecurity (03/02/2023)   Received from [facility]   Hunger Vital Sign    Worried About Running Out of Food in the Last Year: Never true    Ran Out of Food in the Last Year: Never true  Transportation Needs: No Transportation Needs (03/02/2023)   Received from [facility]    In the past 12 months, has lack of transportation kept you from medical appointments or from getting medications?: No    Lack of Transportation (Non-Medical): No  Physical Activity: Not on file  Stress: Not on file  Social Connections: Not on file     Family History:  The patient's family history includes Breast cancer (age of onset: 46) in her mother; Heart Problems in her maternal uncle, paternal aunt, and paternal uncle.  ROS:   12-point review of systems is negative unless otherwise noted in the HPI.   EKGs/Labs/Other Studies Reviewed:    Studies reviewed were summarized above. The additional  studies were reviewed today:  2D echo 01/26/2023: 1. Left ventricular ejection fraction, by estimation, is 55 to 60%. The  left ventricle has normal function. The left ventricle has no regional  wall motion abnormalities. Left ventricular diastolic parameters are  indeterminate. The average left  ventricular global longitudinal strain is -19.0 %.   2. Right ventricular systolic function is normal. The right ventricular  size is normal. There is normal pulmonary artery systolic pressure. The  estimated right ventricular systolic pressure is 28.8 mmHg.   3. The mitral valve is normal in structure. Moderate to severe mitral  valve regurgitation. No evidence of mitral stenosis. There is mild late  systolic prolapse of multiple segments of the anterior leaflet of the  mitral valve.   4. Tricuspid valve regurgitation is mild to moderate.   5. The aortic valve is normal in structure. Aortic valve regurgitation is  not visualized. No aortic stenosis is present.   6. The inferior vena cava is normal in size with greater than 50%  respiratory variability, suggesting right atrial pressure of 3 mmHg.  __________  Luci Bank patch 12/2022: Patient had a min HR of 56 bpm, max HR of 214 bpm, and avg HR of 84 bpm. Predominant underlying rhythm was Sinus Rhythm. 106 Supraventricular Tachycardia runs occurred, the run with the fastest interval lasting 6 beats with a max rate of 214 bpm, the longest lasting 2 mins 10 secs with an avg rate of 146 bpm. Atrial Fibrillation/Flutter occurred (1% burden), ranging from 119-213 bpm (avg of 153 bpm), the longest lasting 2 hours 4 mins with an avg rate of 152 bpm.  Occasional PACs and rare PVCs. __________  Coronary CTA 04/07/2022: FINDINGS: Aorta: Normal size. Mild aortic root and descending aorta calcifications. No dissection.   Aortic Valve:  Trileaflet.  No calcifications.   Coronary Arteries:  Normal coronary origin.  Right dominance.   RCA is a dominant artery  that gives rise to PDA and PLA. There is calcified plaque proximally causing minimal stenosis (<25%).   Left main gives rise to LAD and LCX arteries.  LM has no stenosis.   LAD has no plaque.   LCX is a non-dominant artery that gives rise to two obtuse marginal branches. There is calcified plaque proximally causing mild stenosis (  25-49%).   Other findings:   Normal pulmonary vein drainage into the left atrium.   Normal left atrial appendage without a thrombus.   Normal size of the pulmonary artery.   IMPRESSION: 1. Coronary calcium score of 95.3. This was 59th percentile for age and sex matched control. 2. Normal coronary origin with right dominance. 3. Mild proximal LCx stenosis (25%). 4. Minimal proximal RCA stenosis (<25%). 5. CAD-RADS 2. Mild non-obstructive CAD (25-49%). Consider non-atherosclerotic causes of chest pain. Consider preventive therapy and risk factor modification. 6. Image quality degraded by motion artifacts.  Noncardiac overread: IMPRESSION: 1. No acute extracardiac findings. 2. Tree-in-bud nodules which were present on most recent prior chest CT have resolved, likely sequela of infection or aspiration. 3. Solid pulmonary nodules of the right lower lobe are unchanged in size when compared with prior lung cancer screening CT chest CT dated July 16, 2021. Recommend attention on annual lung cancer screening CT. 4. Aortic Atherosclerosis and Emphysema. __________  2D echo 01/18/2022 Gavin Potters): MILD LV SYSTOLIC DYSFUNCTION (See above)  NORMAL RIGHT VENTRICULAR SYSTOLIC FUNCTION  NO VALVULAR STENOSIS  MODERATE MR  MILD TR, PR  EF 45%  __________  Eugenie Birks MPI 02/24/2020 Gavin Potters): Normal myocardial perfusion scan no evidence of stress-induced  medical ischemia ejection fraction of 64% conclusion negative scan  __________  2D echo 02/24/2020 Gavin Potters): NORMAL LEFT VENTRICULAR SYSTOLIC FUNCTION WITH AN ESTIMATED EF = >55 %  NORMAL RIGHT  VENTRICULAR SYSTOLIC FUNCTION  MODERATE MITRAL VALVE INSUFFICIENCY  MILD-TO-MODERATE TRICUSPID VALVE INSUFFICIENCY  NO VALVULAR STENOSIS     EKG:  EKG is not ordered today.    Recent Labs: 09/05/2022: Magnesium 2.1 09/06/2022: BUN 20; Creatinine, Ser 0.58; Hemoglobin 11.3; Platelets 239; Potassium 3.4; Sodium 137; TSH 1.220  Recent Lipid Panel No results found for: "CHOL", "TRIG", "HDL", "CHOLHDL", "VLDL", "LDLCALC", "LDLDIRECT"  PHYSICAL EXAM:    VS:  BP (!) 150/68 (BP Location: Left Arm, Patient Position: Supine, Cuff Size: Normal)   Pulse 94   Ht 5\' 2"  (1.575 m)   Wt 107 lb 12.8 oz (48.9 kg)   SpO2 96%   BMI 19.72 kg/m   BMI: Body mass index is 19.72 kg/m.  Physical Exam Vitals reviewed.  Constitutional:      Appearance: She is well-developed.  HENT:     Head: Normocephalic and atraumatic.  Eyes:     General:        Right eye: No discharge.        Left eye: No discharge.  Neck:     Vascular: No JVD.  Cardiovascular:     Rate and Rhythm: Normal rate and regular rhythm.     Heart sounds: S1 normal and S2 normal. Heart sounds not distant. No midsystolic click and no opening snap. Murmur heard.     High-pitched blowing holosystolic murmur is present with a grade of 1/6 at the apex.     No friction rub.  Pulmonary:     Effort: Pulmonary effort is normal. No respiratory distress.     Breath sounds: Normal breath sounds. No decreased breath sounds, wheezing, rhonchi or rales.  Chest:     Chest wall: No tenderness.  Abdominal:     General: There is no distension.  Musculoskeletal:     Cervical back: Normal range of motion.     Right lower leg: No edema.     Left lower leg: No edema.  Skin:    General: Skin is warm and dry.     Nails: There is no  clubbing.  Neurological:     Mental Status: She is alert and oriented to person, place, and time.  Psychiatric:        Speech: Speech normal.        Behavior: Behavior normal.        Thought Content: Thought content  normal.        Judgment: Judgment normal.     Wt Readings from Last 3 Encounters:  04/14/23 107 lb 12.8 oz (48.9 kg)  12/13/22 105 lb 6 oz (47.8 kg)  10/16/22 110 lb (49.9 kg)     ASSESSMENT & PLAN:   Nonobstructive CAD: She is doing well and without symptoms concerning for angina or cardiac decompensation.  On apixaban in place of aspirin given underlying A-fib.  Continue aggressive risk factor modification and primary prevention including losartan, metoprolol, and pravastatin.  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  NYHA class is difficult to assess secondary to underlying significant pulmonary disease.  Continue losartan 100 mg, Toprol-XL 50 mg, and furosemide 20 mg.  With preserved LV systolic function, and in the context of no heart failure symptoms, defer escalation of GDMT at this time.  PAF: Maintaining sinus rhythm by physical exam with a 1% A-fib burden on recent Zio patch.  CHA2DS2-VASc 6 (CHF, HTN, age x 2, vascular disease, sex category).  She remains apixaban 5 mg twice daily and does not meet reduced dosing criteria.  No symptoms concerning for bleeding.  Mitral regurgitation: Moderate to severe by TTE in 01/2023.  No significant murmur noted on exam today.  Proceed with TEE for further evaluation.  HTN: Blood pressure is mildly elevated in the office today, though she is currently being treated for bronchitis with steroids which is likely exacerbating her blood pressure readings.  Typically well-controlled.  No changes were made in pharmacotherapy in this setting.  Continue to monitor.  HLD: LDL 70 in 03/2021.  Currently on pravastatin 10 mg.  Managed by PCP.   Informed Consent   Shared Decision Making/Informed Consent{  The risks [esophageal damage, perforation (1:10,000 risk), bleeding, pharyngeal hematoma as well as other potential complications associated with conscious sedation including aspiration, arrhythmia, respiratory failure and death],  benefits (treatment guidance and diagnostic support) and alternatives of a transesophageal echocardiogram were discussed in detail with Ms. Donabedian and she is willing to proceed.         Disposition: F/u with Dr. Kirke Corin or an APP in 1 month.   Medication Adjustments/Labs and Tests Ordered: Current medicines are reviewed at length with the patient today.  Concerns regarding medicines are outlined above. Medication changes, Labs and Tests ordered today are summarized above and listed in the Patient Instructions accessible in Encounters.   Signed, Eula Listen, PA-C 04/14/2023 4:18 PM     [facility] [place] [address] [place], [place] [address] [phone]

## 2023-04-07 NOTE — Progress Notes (Signed)
Cardiology Office Note    Date:  04/14/2023   ID:  [name], DOB 06/04/1946, MRN [id number]  PCP:  Mick Sell, MD  Cardiologist:  Lorine Bears, MD  Electrophysiologist:  None   Chief Complaint: Follow-up  History of Present Illness:   Taylor Chambers is a 77 y.o. female with history of nonobstructive CAD by coronary CTA in 04/2022, HFimpEF, PAF diagnosed in 09/2022, moderate to severe mitral regurgitation, HTN, HLD, bipolar disorder, COPD with prior tobacco use quitting in 2023 who presents for follow-up of Zio patch and echo.  She was previously followed by Dr. Juliann Pares, establishing care with Dr. Kirke Corin in 12/2022.  Echo in 02/2020 showed an EF greater than 55%, normal wall motion, grade 1 diastolic dysfunction, normal RV systolic function, moderate mitral regurgitation, and mild to moderate tricuspid regurgitation.  Lexiscan MPI in 02/2020 showed no evidence of ischemia with an EF of 64%.  Echo in 01/2022 showed an EF of 45% with moderate mitral regurgitation.  Coronary CTA in 04/2022 showed a calcium score of 95, which was the 59th percentile.  There was 25% proximal LCx stenosis with less than 25% proximal RCA stenosis.  Noncardiac overread showed no acute extracardiac findings with resolution of tree-in-bud nodules when compared to prior study as well as a solid pulmonary nodules in the right lower lobe that were unchanged when compared to study from 07/2021 with recommendation for annual follow-up (patient already undergoing lung cancer screening CTs).  She was admitted to the hospital in 09/2022 with COPD exacerbation and was noted to be in A-fib with RVR on presentation.  She converted to sinus rhythm with diltiazem.  She was not discharged on anticoagulation.  Upon establishing care with our office in 12/2022, she did note occasional exertional chest pain and shortness of breath as well as frequent palpitations, particularly with exertion.  She was in sinus rhythm with PACs.  To further  evaluate palpitation burden, she underwent a Zio patch that showed a predominant rhythm of sinus with an average rate of 84 bpm (range 56 to 214 bpm), 106 episodes of SVT lasting up to 2 minutes and 10 seconds, 1% A-fib/flutter burden with ventricular rates ranging from 119 to 213 bpm with an average ventricular rate of 153 bpm and the longest episode lasting 2 hours and 4 minutes.  Echo in 01/2023 showed an EF of 55 to 60%, no regional wall motion abnormalities, normal RV systolic function, ventricular cavity size, and RVSP, moderate to severe mitral valve regurgitation with mild to late systolic prolapse of multiple segments of the anterior leaflet of the mitral valve, mild to moderate tricuspid regurgitation, and an estimated right atrial pressure of 3 mmHg.  She comes in doing well from a cardiac perspective and is without symptoms of angina or cardiac decompensation.  Evaluated by PCP's office yesterday for bronchitis and is currently on a prednisone taper and azithromycin.  With this, she notes an improvement in symptoms.  Not currently requiring supplemental oxygen 24/7, uses this when driving and sometimes at night.  No palpitations, dizziness, presyncope, or syncope.  No falls, hematochezia, or melena.  No significant lower extremity swelling or progressive orthopnea.  Adherent and tolerating cardiac medications without off target effects.   Labs independently reviewed: 03/2023 - potassium 4.1, BUN 19, serum creatinine 0.8, Hgb 15.1, PLT 284 09/2022 - TSH normal, magnesium 2.1, A1c 5.4 06/2022 - albumin 4.4, AST/ALT normal 03/2021 - TC 160, TG 65, HDL 77, LDL 70  Past Medical History:  Diagnosis  Date   Allergies    Anxiety    Aortic atherosclerosis (HCC)    Bipolar affective disorder (HCC)    COPD (chronic obstructive pulmonary disease) (HCC)    Coronary artery calcification seen on CT scan    Depression    Diastolic dysfunction 02/24/2020   a.) TTE 02/24/2020: EF >55%; triv PR, mild TR,  mod MR; G1DD.   DOE (dyspnea on exertion)    Emphysema lung (HCC)    History of cataract    HLD (hyperlipidemia)    Hypertension    Macular degeneration    Migraines    Osteoporosis    Pneumonia    Tobacco use     Past Surgical History:  Procedure Laterality Date   CATARACT EXTRACTION     COLONOSCOPY N/A 08/30/2021   Procedure: COLONOSCOPY;  Surgeon: Jaynie Collins, DO;  Location: [facility] ENDOSCOPY;  Service: Gastroenterology;  Laterality: N/A;   DILATION AND CURETTAGE OF UTERUS     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   Procedure: TOTAL HIP ARTHROPLASTY;  Surgeon: Donato Heinz, MD;  Location: [facility] ORS;  Service: Orthopedics;  Laterality: Right;   WISDOM TOOTH EXTRACTION     WRIST GANGLION EXCISION      Current Medications: Current Meds  Medication Sig   apixaban (ELIQUIS) 5 MG TABS tablet Take 1 tablet (5 mg total) by mouth 2 (two) times daily.   Calcium Carbonate (CALCIUM 500 PO) Take by mouth daily at 12 noon.   Cholecalciferol (VITAMIN D3) 1.25 MG (50000 UT) CAPS Take by mouth daily in the afternoon.   Ipratropium-Albuterol (COMBIVENT RESPIMAT) 20-100 MCG/ACT AERS respimat Inhale 1 puff into the lungs every 6 (six) hours.   ipratropium-albuterol (DUONEB) 0.5-2.5 (3) MG/3ML SOLN Take 3 mLs by nebulization 2 (two) times daily. Mix with budesonide   losartan (COZAAR) 100 MG tablet Take 100 mg by mouth daily.   metoprolol succinate (TOPROL-XL) 50 MG 24 hr tablet Take 1 tablet (50 mg total) by mouth daily. Take with or immediately following a meal.   Multiple Vitamins-Minerals (PRESERVISION AREDS 2+MULTI VIT PO) Take 2 tablets by mouth.   omeprazole (PRILOSEC) 40 MG capsule Take 40 mg by mouth as needed.   pravastatin (PRAVACHOL) 10 MG tablet Take 10 mg by mouth daily.   predniSONE (DELTASONE) 10 MG tablet 6 tabs x 1 day, 5 tabs x 1 day, 4 tabs x 1 day, etc...   sertraline (ZOLOFT) 100 MG tablet Take 50 mg by mouth daily.    Allergies:   Bupropion   Social History    Socioeconomic History   Marital status: Married    Spouse name: Dorene Sorrow   Number of children: Not on file   Years of education: Not on file   Highest education level: Not on file  Occupational History   Not on file  Tobacco Use   Smoking status: Former    Current packs/day: 0.00    Average packs/day: 0.3 packs/day for 50.0 years (12.5 ttl pk-yrs)    Types: Cigarettes    Start date: 01/10/1972    Quit date: 01/09/2022    Years since quitting: 1.2   Smokeless tobacco: Never  Vaping Use   Vaping status: Never Used  Substance and Sexual Activity   Alcohol use: Never   Drug use: Never   Sexual activity: Not on file  Other Topics Concern   Not on file  Social History Narrative   Not on file   Social Determinants of Health   Financial Resource Strain: Low  Risk  (03/02/2023)   Received from [facility]   Overall Financial Resource Strain (CARDIA)    Difficulty of Paying Living Expenses: Not hard at all  Food Insecurity: No Food Insecurity (03/02/2023)   Received from [facility]   Hunger Vital Sign    Worried About Running Out of Food in the Last Year: Never true    Ran Out of Food in the Last Year: Never true  Transportation Needs: No Transportation Needs (03/02/2023)   Received from [facility]    In the past 12 months, has lack of transportation kept you from medical appointments or from getting medications?: No    Lack of Transportation (Non-Medical): No  Physical Activity: Not on file  Stress: Not on file  Social Connections: Not on file     Family History:  The patient's family history includes Breast cancer (age of onset: 46) in her mother; Heart Problems in her maternal uncle, paternal aunt, and paternal uncle.  ROS:   12-point review of systems is negative unless otherwise noted in the HPI.   EKGs/Labs/Other Studies Reviewed:    Studies reviewed were summarized above. The additional  studies were reviewed today:  2D echo 01/26/2023: 1. Left ventricular ejection fraction, by estimation, is 55 to 60%. The  left ventricle has normal function. The left ventricle has no regional  wall motion abnormalities. Left ventricular diastolic parameters are  indeterminate. The average left  ventricular global longitudinal strain is -19.0 %.   2. Right ventricular systolic function is normal. The right ventricular  size is normal. There is normal pulmonary artery systolic pressure. The  estimated right ventricular systolic pressure is 28.8 mmHg.   3. The mitral valve is normal in structure. Moderate to severe mitral  valve regurgitation. No evidence of mitral stenosis. There is mild late  systolic prolapse of multiple segments of the anterior leaflet of the  mitral valve.   4. Tricuspid valve regurgitation is mild to moderate.   5. The aortic valve is normal in structure. Aortic valve regurgitation is  not visualized. No aortic stenosis is present.   6. The inferior vena cava is normal in size with greater than 50%  respiratory variability, suggesting right atrial pressure of 3 mmHg.  __________  Luci Bank patch 12/2022: Patient had a min HR of 56 bpm, max HR of 214 bpm, and avg HR of 84 bpm. Predominant underlying rhythm was Sinus Rhythm. 106 Supraventricular Tachycardia runs occurred, the run with the fastest interval lasting 6 beats with a max rate of 214 bpm, the longest lasting 2 mins 10 secs with an avg rate of 146 bpm. Atrial Fibrillation/Flutter occurred (1% burden), ranging from 119-213 bpm (avg of 153 bpm), the longest lasting 2 hours 4 mins with an avg rate of 152 bpm.  Occasional PACs and rare PVCs. __________  Coronary CTA 04/07/2022: FINDINGS: Aorta: Normal size. Mild aortic root and descending aorta calcifications. No dissection.   Aortic Valve:  Trileaflet.  No calcifications.   Coronary Arteries:  Normal coronary origin.  Right dominance.   RCA is a dominant artery  that gives rise to PDA and PLA. There is calcified plaque proximally causing minimal stenosis (<25%).   Left main gives rise to LAD and LCX arteries.  LM has no stenosis.   LAD has no plaque.   LCX is a non-dominant artery that gives rise to two obtuse marginal branches. There is calcified plaque proximally causing mild stenosis (  25-49%).   Other findings:   Normal pulmonary vein drainage into the left atrium.   Normal left atrial appendage without a thrombus.   Normal size of the pulmonary artery.   IMPRESSION: 1. Coronary calcium score of 95.3. This was 59th percentile for age and sex matched control. 2. Normal coronary origin with right dominance. 3. Mild proximal LCx stenosis (25%). 4. Minimal proximal RCA stenosis (<25%). 5. CAD-RADS 2. Mild non-obstructive CAD (25-49%). Consider non-atherosclerotic causes of chest pain. Consider preventive therapy and risk factor modification. 6. Image quality degraded by motion artifacts.  Noncardiac overread: IMPRESSION: 1. No acute extracardiac findings. 2. Tree-in-bud nodules which were present on most recent prior chest CT have resolved, likely sequela of infection or aspiration. 3. Solid pulmonary nodules of the right lower lobe are unchanged in size when compared with prior lung cancer screening CT chest CT dated July 16, 2021. Recommend attention on annual lung cancer screening CT. 4. Aortic Atherosclerosis and Emphysema. __________  2D echo 01/18/2022 Gavin Potters): MILD LV SYSTOLIC DYSFUNCTION (See above)  NORMAL RIGHT VENTRICULAR SYSTOLIC FUNCTION  NO VALVULAR STENOSIS  MODERATE MR  MILD TR, PR  EF 45%  __________  Eugenie Birks MPI 02/24/2020 Gavin Potters): Normal myocardial perfusion scan no evidence of stress-induced  medical ischemia ejection fraction of 64% conclusion negative scan  __________  2D echo 02/24/2020 Gavin Potters): NORMAL LEFT VENTRICULAR SYSTOLIC FUNCTION WITH AN ESTIMATED EF = >55 %  NORMAL RIGHT  VENTRICULAR SYSTOLIC FUNCTION  MODERATE MITRAL VALVE INSUFFICIENCY  MILD-TO-MODERATE TRICUSPID VALVE INSUFFICIENCY  NO VALVULAR STENOSIS     EKG:  EKG is not ordered today.    Recent Labs: 09/05/2022: Magnesium 2.1 09/06/2022: BUN 20; Creatinine, Ser 0.58; Hemoglobin 11.3; Platelets 239; Potassium 3.4; Sodium 137; TSH 1.220  Recent Lipid Panel No results found for: "CHOL", "TRIG", "HDL", "CHOLHDL", "VLDL", "LDLCALC", "LDLDIRECT"  PHYSICAL EXAM:    VS:  BP (!) 150/68 (BP Location: Left Arm, Patient Position: Supine, Cuff Size: Normal)   Pulse 94   Ht 5\' 2"  (1.575 m)   Wt 107 lb 12.8 oz (48.9 kg)   SpO2 96%   BMI 19.72 kg/m   BMI: Body mass index is 19.72 kg/m.  Physical Exam Vitals reviewed.  Constitutional:      Appearance: She is well-developed.  HENT:     Head: Normocephalic and atraumatic.  Eyes:     General:        Right eye: No discharge.        Left eye: No discharge.  Neck:     Vascular: No JVD.  Cardiovascular:     Rate and Rhythm: Normal rate and regular rhythm.     Heart sounds: S1 normal and S2 normal. Heart sounds not distant. No midsystolic click and no opening snap. Murmur heard.     High-pitched blowing holosystolic murmur is present with a grade of 1/6 at the apex.     No friction rub.  Pulmonary:     Effort: Pulmonary effort is normal. No respiratory distress.     Breath sounds: Normal breath sounds. No decreased breath sounds, wheezing, rhonchi or rales.  Chest:     Chest wall: No tenderness.  Abdominal:     General: There is no distension.  Musculoskeletal:     Cervical back: Normal range of motion.     Right lower leg: No edema.     Left lower leg: No edema.  Skin:    General: Skin is warm and dry.     Nails: There is no  clubbing.  Neurological:     Mental Status: She is alert and oriented to person, place, and time.  Psychiatric:        Speech: Speech normal.        Behavior: Behavior normal.        Thought Content: Thought content  normal.        Judgment: Judgment normal.     Wt Readings from Last 3 Encounters:  04/14/23 107 lb 12.8 oz (48.9 kg)  12/13/22 105 lb 6 oz (47.8 kg)  10/16/22 110 lb (49.9 kg)     ASSESSMENT & PLAN:   Nonobstructive CAD: She is doing well and without symptoms concerning for angina or cardiac decompensation.  On apixaban in place of aspirin given underlying A-fib.  Continue aggressive risk factor modification and primary prevention including losartan, metoprolol, and pravastatin.  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  NYHA class is difficult to assess secondary to underlying significant pulmonary disease.  Continue losartan 100 mg, Toprol-XL 50 mg, and furosemide 20 mg.  With preserved LV systolic function, and in the context of no heart failure symptoms, defer escalation of GDMT at this time.  PAF: Maintaining sinus rhythm by physical exam with a 1% A-fib burden on recent Zio patch.  CHA2DS2-VASc 6 (CHF, HTN, age x 2, vascular disease, sex category).  She remains apixaban 5 mg twice daily and does not meet reduced dosing criteria.  No symptoms concerning for bleeding.  Mitral regurgitation: Moderate to severe by TTE in 01/2023.  No significant murmur noted on exam today.  Proceed with TEE for further evaluation.  HTN: Blood pressure is mildly elevated in the office today, though she is currently being treated for bronchitis with steroids which is likely exacerbating her blood pressure readings.  Typically well-controlled.  No changes were made in pharmacotherapy in this setting.  Continue to monitor.  HLD: LDL 70 in 03/2021.  Currently on pravastatin 10 mg.  Managed by PCP.   Informed Consent   Shared Decision Making/Informed Consent{  The risks [esophageal damage, perforation (1:10,000 risk), bleeding, pharyngeal hematoma as well as other potential complications associated with conscious sedation including aspiration, arrhythmia, respiratory failure and death],  benefits (treatment guidance and diagnostic support) and alternatives of a transesophageal echocardiogram were discussed in detail with Ms. Donabedian and she is willing to proceed.         Disposition: F/u with Dr. Kirke Corin or an APP in 1 month.   Medication Adjustments/Labs and Tests Ordered: Current medicines are reviewed at length with the patient today.  Concerns regarding medicines are outlined above. Medication changes, Labs and Tests ordered today are summarized above and listed in the Patient Instructions accessible in Encounters.   Signed, Eula Listen, PA-C 04/14/2023 4:18 PM     [facility] [address] [place], [place] [address] [phone]

## 2023-04-14 ENCOUNTER — Encounter: Payer: Self-pay | Admitting: Physician Assistant

## 2023-04-14 ENCOUNTER — Ambulatory Visit: Payer: Medicare HMO | Attending: Physician Assistant | Admitting: Physician Assistant

## 2023-04-14 VITALS — BP 150/68 | HR 94 | Ht 62.0 in | Wt 107.8 lb

## 2023-04-14 DIAGNOSIS — I2510 Atherosclerotic heart disease of native coronary artery without angina pectoris: Secondary | ICD-10-CM

## 2023-04-14 DIAGNOSIS — I340 Nonrheumatic mitral (valve) insufficiency: Secondary | ICD-10-CM | POA: Diagnosis not present

## 2023-04-14 DIAGNOSIS — I10 Essential (primary) hypertension: Secondary | ICD-10-CM

## 2023-04-14 DIAGNOSIS — I5032 Chronic diastolic (congestive) heart failure: Secondary | ICD-10-CM

## 2023-04-14 DIAGNOSIS — I480 Paroxysmal atrial fibrillation: Secondary | ICD-10-CM

## 2023-04-14 DIAGNOSIS — E785 Hyperlipidemia, unspecified: Secondary | ICD-10-CM

## 2023-04-14 NOTE — Patient Instructions (Signed)
Medication Instructions:  Your Physician recommend you continue on your current medication as directed.    *If you need a refill on your cardiac medications before your next appointment, please call your pharmacy*   Lab Work: NONE ordered If you have labs (blood work) drawn today and your tests are completely normal, you will receive your results only by: MyChart Message (if you have MyChart) OR A paper copy in the mail If you have any lab test that is abnormal or we need to change your treatment, we will call you to review the results.   Testing/Procedures:    Dear Taylor Chambers  You are scheduled for a TEE (Transesophageal Echocardiogram) on Thursday, November 14 with Dr. Mariah Milling.  Please arrive at the [facility] Entrance of [facility], [address], [place] [address] at 6:30 AM (This is 1 hour(s) prior to your procedure time).  Proceed to the Check-In Desk directly inside the entrance.  Procedure Parking: Use the entrance off of the [address] side of the hospital. Turn right upon entering and follow the driveway to parking that is directly in front of the [facility]. There is no valet parking available at this entrance, however there is an awning directly in front of the [facility] for drop off/ pick up for patients.   DIET:  Nothing to eat or drink after midnight except a sip of water with medications (see medication instructions below)  Continue taking your anticoagulant (blood thinner): Apixaban (Eliquis).  You will need to continue this after your procedure until you are told by your provider that it is safe to stop.    FYI:  For your safety, and to allow [place] to monitor your vital signs accurately during the surgery/procedure we request: If you have artificial nails, gel coating, SNS etc, please have those removed prior to your surgery/procedure. Not having the nail coverings /polish removed may result in cancellation or delay of your  surgery/procedure.  You must have a responsible person to drive you home and stay in the waiting area during your procedure. Failure to do so could result in cancellation.  Bring your insurance cards.  *Special Note: Every effort is made to have your procedure done on time. Occasionally there are emergencies that occur at the hospital that may cause delays. Please be patient if a delay does occur.     Follow-Up: At [facility], you and your health needs are our priority.  As part of our continuing mission to provide you with exceptional heart care, we have created designated Provider Care Teams.  These Care Teams include your primary Cardiologist (physician) and Advanced Practice Providers (APPs -  Physician Assistants and Nurse Practitioners) who all work together to provide you with the care you need, when you need it.  We recommend signing up for the patient portal called "MyChart".  Sign up information is provided on this After Visit Summary.  MyChart is used to connect with patients for Virtual Visits (Telemedicine).  Patients are able to view lab/test results, encounter notes, upcoming appointments, etc.  Non-urgent messages can be sent to your provider as well.   To learn more about what you can do with MyChart, go to [email or web address].    Your next appointment:   1 month(s)  Provider:   You may see Lorine Bears, MD or one of the following Advanced Practice Providers on your designated Care Team:   Eula Listen, [place]

## 2023-04-20 ENCOUNTER — Ambulatory Visit
Admission: RE | Admit: 2023-04-20 | Discharge: 2023-04-20 | Disposition: A | Discharge status: Home / Self Care | Payer: Medicare HMO | Source: Home / Self Care | Attending: Physician Assistant | Admitting: Physician Assistant

## 2023-04-20 ENCOUNTER — Ambulatory Visit
Admission: RE | Admit: 2023-04-20 | Discharge: 2023-04-20 | Disposition: A | Discharge status: Home / Self Care | Payer: Medicare HMO | Attending: Cardiovascular Disease | Admitting: Cardiovascular Disease

## 2023-04-20 ENCOUNTER — Encounter
Admission: RE | Disposition: A | Discharge status: Home / Self Care | Payer: Self-pay | Source: Home / Self Care | Attending: Cardiovascular Disease

## 2023-04-20 ENCOUNTER — Encounter: Payer: Self-pay | Admitting: Cardiovascular Disease

## 2023-04-20 DIAGNOSIS — J40 Bronchitis, not specified as acute or chronic: Secondary | ICD-10-CM | POA: Insufficient documentation

## 2023-04-20 DIAGNOSIS — E785 Hyperlipidemia, unspecified: Secondary | ICD-10-CM | POA: Insufficient documentation

## 2023-04-20 DIAGNOSIS — I5032 Chronic diastolic (congestive) heart failure: Secondary | ICD-10-CM | POA: Insufficient documentation

## 2023-04-20 DIAGNOSIS — I480 Paroxysmal atrial fibrillation: Secondary | ICD-10-CM | POA: Insufficient documentation

## 2023-04-20 DIAGNOSIS — I428 Other cardiomyopathies: Secondary | ICD-10-CM | POA: Insufficient documentation

## 2023-04-20 DIAGNOSIS — I700 Atherosclerosis of aorta: Secondary | ICD-10-CM | POA: Diagnosis not present

## 2023-04-20 DIAGNOSIS — Z87891 Personal history of nicotine dependence: Secondary | ICD-10-CM | POA: Insufficient documentation

## 2023-04-20 DIAGNOSIS — J449 Chronic obstructive pulmonary disease, unspecified: Secondary | ICD-10-CM | POA: Diagnosis not present

## 2023-04-20 DIAGNOSIS — R0602 Shortness of breath: Secondary | ICD-10-CM

## 2023-04-20 DIAGNOSIS — I110 Hypertensive heart disease with heart failure: Secondary | ICD-10-CM | POA: Diagnosis not present

## 2023-04-20 DIAGNOSIS — Z79899 Other long term (current) drug therapy: Secondary | ICD-10-CM | POA: Diagnosis not present

## 2023-04-20 DIAGNOSIS — I2510 Atherosclerotic heart disease of native coronary artery without angina pectoris: Secondary | ICD-10-CM | POA: Diagnosis not present

## 2023-04-20 DIAGNOSIS — I340 Nonrheumatic mitral (valve) insufficiency: Secondary | ICD-10-CM | POA: Insufficient documentation

## 2023-04-20 DIAGNOSIS — Z7901 Long term (current) use of anticoagulants: Secondary | ICD-10-CM | POA: Insufficient documentation

## 2023-04-20 DIAGNOSIS — F319 Bipolar disorder, unspecified: Secondary | ICD-10-CM | POA: Insufficient documentation

## 2023-04-20 HISTORY — PX: TEE WITHOUT CARDIOVERSION: SHX5443

## 2023-04-20 SURGERY — TRANSESOPHAGEAL ECHOCARDIOGRAM (TEE)
Anesthesia: Moderate Sedation

## 2023-04-20 MED ORDER — LIDOCAINE VISCOUS HCL 2 % MT SOLN
15.0000 mL | Freq: Once | OROMUCOSAL | Status: AC
  Administered 2023-04-20: 15 mL via OROMUCOSAL
  Filled 2023-04-20: qty 15

## 2023-04-20 MED ORDER — MIDAZOLAM HCL 2 MG/2ML IJ SOLN
INTRAMUSCULAR | Status: AC | PRN
  Administered 2023-04-20: 2 mg via INTRAVENOUS
  Administered 2023-04-20: 1 mg via INTRAVENOUS

## 2023-04-20 MED ORDER — SODIUM CHLORIDE 0.9 % IV SOLN
INTRAVENOUS | Status: DC
  Administered 2023-04-20: 250 mL via INTRAVENOUS

## 2023-04-20 MED ORDER — LIDOCAINE VISCOUS HCL 2 % MT SOLN
OROMUCOSAL | Status: AC
  Filled 2023-04-20: qty 15

## 2023-04-20 MED ORDER — MIDAZOLAM HCL 2 MG/2ML IJ SOLN
INTRAMUSCULAR | Status: AC
Start: 2023-04-20 — End: ?
  Filled 2023-04-20: qty 4

## 2023-04-20 MED ORDER — BUTAMBEN-TETRACAINE-BENZOCAINE 2-2-14 % EX AERO
3.0000 | INHALATION_SPRAY | Freq: Once | CUTANEOUS | Status: AC
  Administered 2023-04-20: 3 via TOPICAL
  Filled 2023-04-20: qty 20

## 2023-04-20 MED ORDER — BUTAMBEN-TETRACAINE-BENZOCAINE 2-2-14 % EX AERO
INHALATION_SPRAY | CUTANEOUS | Status: AC
  Filled 2023-04-20: qty 5

## 2023-04-20 MED ORDER — FENTANYL CITRATE (PF) 100 MCG/2ML IJ SOLN
INTRAMUSCULAR | Status: AC | PRN
  Administered 2023-04-20: 25 ug via INTRAVENOUS
  Administered 2023-04-20: 50 ug via INTRAVENOUS

## 2023-04-20 MED ORDER — FENTANYL CITRATE (PF) 100 MCG/2ML IJ SOLN
INTRAMUSCULAR | Status: AC
  Filled 2023-04-20: qty 2

## 2023-04-21 DIAGNOSIS — I340 Nonrheumatic mitral (valve) insufficiency: Secondary | ICD-10-CM

## 2023-04-21 DIAGNOSIS — R0602 Shortness of breath: Secondary | ICD-10-CM

## 2023-04-21 NOTE — Progress Notes (Signed)
Transesophageal Echocardiogram :  Indication: Mitral valve regurgitation Requesting/ordering  physician: Shea Evans, ryan  Procedure: Benzocaine spray x2 and 2 mls x 2 of viscous lidocaine were given orally to provide local anesthesia to the oropharynx. The patient was positioned supine on the left side, bite block provided. The patient was moderately sedated with the doses of versed and fentanyl as detailed below.  Using digital technique an omniplane probe was advanced into the distal esophagus without incident.   Moderate sedation: 1. Sedation used:  Versed: 3 mg IV, Fentanyl: 75 mcg 2. Time administered: 7:45 AM   time when patient started recovery: 8:15 AM Total sedation time 30 minutes 3. I was face to face during this time  See report in EPIC  for complete details: In brief, transgastric imaging revealed normal LV function with no RWMAs and no mural apical thrombus.  .  Estimated ejection fraction was 55%.  Right sided cardiac chambers were normal with no evidence of pulmonary hypertension.  Mild to moderate mitral valve regurgitation, and worse to possibly moderate To small jets noted, no significant prolapse noted  Imaging of the septum showed no ASD or VSD Bubble study was negative for shunt 2D and color flow confirmed no PFO  The LA was well visualized in orthogonal views.  There was no spontaneous contrast and no thrombus in the LA and LA appendage   The descending thoracic aorta had no  mural aortic debris with no evidence of aneurysmal dilation or disection Mild aortic atherosclerosis aortic arch, descending aorta   Taylor Chambers 04/21/2023 8:10 AM

## 2023-04-23 LAB — ECHO TEE

## 2023-04-24 ENCOUNTER — Encounter: Payer: Self-pay | Admitting: Cardiovascular Disease

## 2023-04-25 NOTE — Interval H&P Note (Signed)
History and Physical Interval Note:  04/25/2023 8:25 AM  Taylor Chambers  has presented today for surgery, with the diagnosis of TEE  Mitral valve regurgitation.  The various methods of treatment have been discussed with the patient and family. After consideration of risks, benefits and other options for treatment, the patient has consented to  Procedure(s): TRANSESOPHAGEAL ECHOCARDIOGRAM (N/A) as a surgical intervention.  The patient's history has been reviewed, patient examined, no change in status, stable for surgery.  I have reviewed the patient's chart and labs.  Questions were answered to the patient's satisfaction.     Julien Nordmann

## 2023-04-25 NOTE — H&P (Signed)
H&P Addendum, pre-cardioversion ° °Patient was seen and evaluated prior to -cardioversion procedure °Symptoms, prior testing details again confirmed with the patient °Patient examined, no significant change from prior exam °Lab work reviewed in detail personally by myself °Patient understands risk and benefit of the procedure,  °The risks (stroke, cardiac arrhythmias rarely resulting in the need for a temporary or permanent pacemaker, skin irritation or burns and complications associated with conscious sedation including aspiration, arrhythmia, respiratory failure and death), benefits (restoration of normal sinus rhythm) and alternatives of a direct current cardioversion were explained in detail °Patient willing to proceed. ° °Signed, °Tim Da Michelle, MD, Ph.D °[facility]  °

## 2023-05-16 ENCOUNTER — Ambulatory Visit: Payer: Medicare HMO | Attending: Physician Assistant | Admitting: Physician Assistant

## 2023-05-16 ENCOUNTER — Encounter: Payer: Self-pay | Admitting: Physician Assistant

## 2023-05-16 VITALS — BP 130/60 | HR 79 | Ht 62.0 in | Wt 110.4 lb

## 2023-05-16 DIAGNOSIS — I340 Nonrheumatic mitral (valve) insufficiency: Secondary | ICD-10-CM | POA: Diagnosis not present

## 2023-05-16 DIAGNOSIS — Z79899 Other long term (current) drug therapy: Secondary | ICD-10-CM

## 2023-05-16 DIAGNOSIS — I480 Paroxysmal atrial fibrillation: Secondary | ICD-10-CM

## 2023-05-16 DIAGNOSIS — I2510 Atherosclerotic heart disease of native coronary artery without angina pectoris: Secondary | ICD-10-CM

## 2023-05-16 DIAGNOSIS — I10 Essential (primary) hypertension: Secondary | ICD-10-CM

## 2023-05-16 DIAGNOSIS — I5032 Chronic diastolic (congestive) heart failure: Secondary | ICD-10-CM | POA: Diagnosis not present

## 2023-05-16 DIAGNOSIS — E785 Hyperlipidemia, unspecified: Secondary | ICD-10-CM

## 2023-05-16 NOTE — Progress Notes (Signed)
Cardiology Office Note    Date:  05/16/2023   ID:  Taylor Chambers, DOB 06-26-1945, MRN [id number]  PCP:  Mick Sell, MD  Cardiologist:  Lorine Bears, MD  Electrophysiologist:  None   Chief Complaint: Follow up  History of Present Illness:   Taylor Chambers is a 77 y.o. female with history of nonobstructive CAD by coronary CTA in 04/2022, HFimpEF, PAF diagnosed in 09/2022, moderate mitral regurgitation, HTN, HLD, bipolar disorder, COPD with prior tobacco use quitting in 2023 who presents for follow-up of TEE.   She was previously followed by Dr. Juliann Pares, establishing care with Dr. Kirke Corin in 12/2022.  Echo in 02/2020 showed an EF greater than 55%, normal wall motion, grade 1 diastolic dysfunction, normal RV systolic function, moderate mitral regurgitation, and mild to moderate tricuspid regurgitation.  Lexiscan MPI in 02/2020 showed no evidence of ischemia with an EF of 64%.  Echo in 01/2022 showed an EF of 45% with moderate mitral regurgitation.  Coronary CTA in 04/2022 showed a calcium score of 95, which was the 59th percentile.  There was 25% proximal LCx stenosis with less than 25% proximal RCA stenosis.  Noncardiac overread showed no acute extracardiac findings with resolution of tree-in-bud nodules when compared to prior study as well as a solid pulmonary nodules in the right lower lobe that were unchanged when compared to study from 07/2021 with recommendation for annual follow-up (patient already undergoing lung cancer screening CTs).  She was admitted to the hospital in 09/2022 with COPD exacerbation and was noted to be in A-fib with RVR on presentation.  She converted to sinus rhythm with diltiazem.  She was not discharged on anticoagulation.  Upon establishing care with our office in 12/2022, she did note occasional exertional chest pain and shortness of breath as well as frequent palpitations, particularly with exertion.  She was in sinus rhythm with PACs.  To further evaluate palpitation  burden, she underwent a Zio patch that showed a predominant rhythm of sinus with an average rate of 84 bpm (range 56 to 214 bpm), 106 episodes of SVT lasting up to 2 minutes and 10 seconds, 1% A-fib/flutter burden with ventricular rates ranging from 119 to 213 bpm with an average ventricular rate of 153 bpm and the longest episode lasting 2 hours and 4 minutes.  Echo in 01/2023 showed an EF of 55 to 60%, no regional wall motion abnormalities, normal RV systolic function, ventricular cavity size, and RVSP, moderate to severe mitral valve regurgitation with mild to late systolic prolapse of multiple segments of the anterior leaflet of the mitral valve, mild to moderate tricuspid regurgitation, and an estimated right atrial pressure of 3 mmHg.  She was last seen in the office on 04/14/2023 and was without symptoms of angina or cardiac decompensation.  To further evaluate her mitral regurgitation, she underwent TEE on 04/20/2023 that showed an EF of 60 to 65%, no regional wall motion maladies, normal RV systolic function and ventricular cavity size, no left atrial or left atrial appendage thrombus, moderate mitral regurgitation, aortic atherosclerosis involving the aortic arch and descending aorta, estimated right atrial pressure of 3 mmHg, and no evidence of interatrial shunt.  She comes in accompanied by her husband today and is doing well from a cardiac perspective, without symptoms of angina or cardiac decompensation.  She indicates she is getting over the flu, otherwise is without complaints.  No dizziness, presyncope, or syncope.  Not requiring supplemental oxygen 24/7.  Adherent and tolerating cardiac medications.  No falls  or symptoms concerning for bleeding.  No lower extremity swelling.  Does not have any acute cardiac concerns at this time.   Labs independently reviewed: 03/2023 - potassium 4.1, BUN 19, serum creatinine 0.8, Hgb 15.1, PLT 284 09/2022 - TSH normal, magnesium 2.1, A1c 5.4 06/2022 - albumin  4.4, AST/ALT normal 03/2021 - TC 160, TG 65, HDL 77, LDL 70  Past Medical History:  Diagnosis Date   Allergies    Anxiety    Aortic atherosclerosis (HCC)    Bipolar affective disorder (HCC)    COPD (chronic obstructive pulmonary disease) (HCC)    Coronary artery calcification seen on CT scan    Depression    Diastolic dysfunction 02/24/2020   a.) TTE 02/24/2020: EF >55%; triv PR, mild TR, mod MR; G1DD.   DOE (dyspnea on exertion)    Emphysema lung (HCC)    History of cataract    HLD (hyperlipidemia)    Hypertension    Macular degeneration    Migraines    Osteoporosis    Pneumonia    Tobacco use     Past Surgical History:  Procedure Laterality Date   CATARACT EXTRACTION     COLONOSCOPY N/A 08/30/2021   Procedure: COLONOSCOPY;  Surgeon: Jaynie Collins, DO;  [facility] ENDOSCOPY;  Service: Gastroenterology;  Laterality: N/A;   DILATION AND CURETTAGE OF UTERUS     TEE WITHOUT CARDIOVERSION N/A 04/20/2023   Procedure: TRANSESOPHAGEAL ECHOCARDIOGRAM;  Surgeon: Antonieta Iba, MD;  Location: [facility] ORS;  Service: Cardiovascular;  Laterality: N/A;   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   Procedure: TOTAL HIP ARTHROPLASTY;  Surgeon: Donato Heinz, MD;  Location: [facility] ORS;  Service: Orthopedics;  Laterality: Right;   WISDOM TOOTH EXTRACTION     WRIST GANGLION EXCISION      Current Medications: Current Meds  Medication Sig   apixaban (ELIQUIS) 5 MG TABS tablet Take 1 tablet (5 mg total) by mouth 2 (two) times daily.   Calcium Carbonate (CALCIUM 500 PO) Take 500 mg by mouth daily at 12 noon.   Cholecalciferol (VITAMIN D3) 1.25 MG (50000 UT) CAPS Take 5,000 Units by mouth daily in the afternoon.   ibuprofen (ADVIL) 200 MG tablet Take 400 mg by mouth every 6 (six) hours as needed for headache.   Ipratropium-Albuterol (COMBIVENT RESPIMAT) 20-100 MCG/ACT AERS respimat Inhale 1 puff into the lungs 2 (two) times daily.   ipratropium-albuterol (DUONEB) 0.5-2.5 (3) MG/3ML  SOLN Take 3 mLs by nebulization 2 (two) times daily. Mix with budesonide   losartan (COZAAR) 100 MG tablet Take 100 mg by mouth daily.   metoprolol succinate (TOPROL-XL) 50 MG 24 hr tablet Take 1 tablet (50 mg total) by mouth daily. Take with or immediately following a meal.   Multiple Vitamins-Minerals (PRESERVISION AREDS 2+MULTI VIT PO) Take 2 tablets by mouth daily.   omeprazole (PRILOSEC) 40 MG capsule Take 40 mg by mouth daily as needed (Heartburn).   pravastatin (PRAVACHOL) 10 MG tablet Take 10 mg by mouth daily.   predniSONE (DELTASONE) 10 MG tablet Take 10 mg by mouth daily with breakfast.   sulfamethoxazole-trimethoprim (BACTRIM) 400-80 MG tablet     Allergies:   Bupropion   Social History   Socioeconomic History   Marital status: Married    Spouse name: Dorene Sorrow   Number of children: Not on file   Years of education: Not on file   Highest education level: Not on file  Occupational History   Not on file  Tobacco Use   Smoking  status: Former    Current packs/day: 0.00    Average packs/day: 0.3 packs/day for 50.0 years (12.5 ttl pk-yrs)    Types: Cigarettes    Start date: 01/10/1972    Quit date: 01/09/2022    Years since quitting: 1.3   Smokeless tobacco: Never  Vaping Use   Vaping status: Never Used  Substance and Sexual Activity   Alcohol use: Never   Drug use: Never   Sexual activity: Not on file  Other Topics Concern   Not on file  Social History Narrative   Not on file   Social Determinants of Health   Financial Resource Strain: Low Risk  (03/02/2023)   Received from [facility]   Overall Financial Resource Strain (CARDIA)    Difficulty of Paying Living Expenses: Not hard at all  Food Insecurity: No Food Insecurity (03/02/2023)   Received from [facility]   Hunger Vital Sign    Worried About Running Out of Food in the Last Year: Never true    Ran Out of Food in the Last Year: Never true  Transportation Needs: No  Transportation Needs (03/02/2023)   Received from [facility] - Transportation    In the past 12 months, has lack of transportation kept you from medical appointments or from getting medications?: No    Lack of Transportation (Non-Medical): No  Physical Activity: Not on file  Stress: Not on file  Social Connections: Not on file     Family History:  The patient's family history includes Breast cancer (age of onset: 56) in her mother; Heart Problems in her maternal uncle, paternal aunt, and paternal uncle.  ROS:   12-point review of systems is negative unless otherwise noted in the HPI.   EKGs/Labs/Other Studies Reviewed:    Studies reviewed were summarized above. The additional studies were reviewed today:  TEE 04/20/2023: 1. Left ventricular ejection fraction, by estimation, is 60 to 65%. The  left ventricle has normal function. The left ventricle has no regional  wall motion abnormalities.   2. Right ventricular systolic function is normal. The right ventricular  size is normal.   3. No left atrial/left atrial appendage thrombus was detected.   4. The mitral valve is normal in structure. Moderate mitral valve  regurgitation. No evidence of mitral stenosis. No significant valve  prolapse noted.   5. The aortic valve is normal in structure. Aortic valve regurgitation is  not visualized. No aortic stenosis is present.   6. There is mild (Grade II) atheroma plaque involving the aortic arch and  descending aorta.   7. The inferior vena cava is normal in size with greater than 50%  respiratory variability, suggesting right atrial pressure of 3 mmHg.   8. Agitated saline contrast bubble study was negative, with no evidence  of any interatrial shunt.   Conclusion(s)/Recommendation(s): Normal biventricular function without  evidence of hemodynamically significant valvular heart disease.   FINDINGS   Left Ventricle: Left ventricular ejection fraction, by  estimation, is 60  to 65%. The left ventricle has normal function. The left ventricle has no  regional wall motion abnormalities. The left ventricular internal cavity  size was normal in size. There is   no left ventricular hypertrophy.  __________  2D echo 01/26/2023: 1. Left ventricular ejection fraction, by estimation, is 55 to 60%. The  left ventricle has normal function. The left ventricle has no regional  wall motion abnormalities. Left ventricular diastolic parameters are  indeterminate. The average left  ventricular global longitudinal strain is -19.0 %.   2. Right ventricular systolic function is normal. The right ventricular  size is normal. There is normal pulmonary artery systolic pressure. The  estimated right ventricular systolic pressure is 28.8 mmHg.   3. The mitral valve is normal in structure. Moderate to severe mitral  valve regurgitation. No evidence of mitral stenosis. There is mild late  systolic prolapse of multiple segments of the anterior leaflet of the  mitral valve.   4. Tricuspid valve regurgitation is mild to moderate.   5. The aortic valve is normal in structure. Aortic valve regurgitation is  not visualized. No aortic stenosis is present.   6. The inferior vena cava is normal in size with greater than 50%  respiratory variability, suggesting right atrial pressure of 3 mmHg.  __________   Luci Bank patch 12/2022: Patient had a min HR of 56 bpm, max HR of 214 bpm, and avg HR of 84 bpm. Predominant underlying rhythm was Sinus Rhythm. 106 Supraventricular Tachycardia runs occurred, the run with the fastest interval lasting 6 beats with a max rate of 214 bpm, the longest lasting 2 mins 10 secs with an avg rate of 146 bpm. Atrial Fibrillation/Flutter occurred (1% burden), ranging from 119-213 bpm (avg of 153 bpm), the longest lasting 2 hours 4 mins with an avg rate of 152 bpm.  Occasional PACs and rare PVCs. __________   Coronary CTA 04/07/2022: FINDINGS: Aorta:  Normal size. Mild aortic root and descending aorta calcifications. No dissection.   Aortic Valve:  Trileaflet.  No calcifications.   Coronary Arteries:  Normal coronary origin.  Right dominance.   RCA is a dominant artery that gives rise to PDA and PLA. There is calcified plaque proximally causing minimal stenosis (<25%).   Left main gives rise to LAD and LCX arteries.  LM has no stenosis.   LAD has no plaque.   LCX is a non-dominant artery that gives rise to two obtuse marginal branches. There is calcified plaque proximally causing mild stenosis (25-49%).   Other findings:   Normal pulmonary vein drainage into the left atrium.   Normal left atrial appendage without a thrombus.   Normal size of the pulmonary artery.   IMPRESSION: 1. Coronary calcium score of 95.3. This was 59th percentile for age and sex matched control. 2. Normal coronary origin with right dominance. 3. Mild proximal LCx stenosis (25%). 4. Minimal proximal RCA stenosis (<25%). 5. CAD-RADS 2. Mild non-obstructive CAD (25-49%). Consider non-atherosclerotic causes of chest pain. Consider preventive therapy and risk factor modification. 6. Image quality degraded by motion artifacts.   Noncardiac overread: IMPRESSION: 1. No acute extracardiac findings. 2. Tree-in-bud nodules which were present on most recent prior chest CT have resolved, likely sequela of infection or aspiration. 3. Solid pulmonary nodules of the right lower lobe are unchanged in size when compared with prior lung cancer screening CT chest CT dated July 16, 2021. Recommend attention on annual lung cancer screening CT. 4. Aortic Atherosclerosis and Emphysema. __________   2D echo 01/18/2022 Gavin Potters): MILD LV SYSTOLIC DYSFUNCTION (See above)  NORMAL RIGHT VENTRICULAR SYSTOLIC FUNCTION  NO VALVULAR STENOSIS  MODERATE MR  MILD TR, PR  EF 45%  __________   Eugenie Birks MPI 02/24/2020 Gavin Potters): Normal myocardial perfusion scan no  evidence of stress-induced  medical ischemia ejection fraction of 64% conclusion negative scan  __________   2D echo 02/24/2020 Gavin Potters): NORMAL LEFT VENTRICULAR SYSTOLIC FUNCTION WITH AN ESTIMATED EF = >55 %  NORMAL  RIGHT VENTRICULAR SYSTOLIC FUNCTION  MODERATE MITRAL VALVE INSUFFICIENCY  MILD-TO-MODERATE TRICUSPID VALVE INSUFFICIENCY  NO VALVULAR STENOSIS    EKG:  EKG is not ordered today.    Recent Labs: 09/05/2022: Magnesium 2.1 09/06/2022: BUN 20; Creatinine, Ser 0.58; Hemoglobin 11.3; Platelets 239; Potassium 3.4; Sodium 137; TSH 1.220  Recent Lipid Panel No results found for: "CHOL", "TRIG", "HDL", "CHOLHDL", "VLDL", "LDLCALC", "LDLDIRECT"  PHYSICAL EXAM:    VS:  BP 130/60 (BP Location: Left Arm, Patient Position: Sitting, Cuff Size: Normal)   Pulse 79   Ht 5\' 2"  (1.575 m)   Wt 110 lb 6.4 oz (50.1 kg)   SpO2 95%   BMI 20.19 kg/m   BMI: Body mass index is 20.19 kg/m.  Physical Exam Vitals reviewed.  Constitutional:      Appearance: She is well-developed.  HENT:     Head: Normocephalic and atraumatic.  Eyes:     General:        Right eye: No discharge.        Left eye: No discharge.  Neck:     Vascular: No JVD.  Cardiovascular:     Rate and Rhythm: Normal rate and regular rhythm.     Pulses:          Posterior tibial pulses are 2+ on the left side.     Heart sounds: S1 normal and S2 normal. Heart sounds not distant. No midsystolic click and no opening snap. Murmur heard.     Systolic murmur is present with a grade of 1/6 at the lower left sternal border.     No friction rub.  Pulmonary:     Effort: Pulmonary effort is normal. No respiratory distress.     Breath sounds: Normal breath sounds. No decreased breath sounds, wheezing, rhonchi or rales.  Chest:     Chest wall: No tenderness.  Abdominal:     General: There is no distension.  Musculoskeletal:     Cervical back: Normal range of motion.     Right lower leg: No edema.     Left lower leg: No edema.   Skin:    General: Skin is warm and dry.     Nails: There is no clubbing.  Neurological:     Mental Status: She is alert and oriented to person, place, and time.  Psychiatric:        Speech: Speech normal.        Behavior: Behavior normal.        Thought Content: Thought content normal.        Judgment: Judgment normal.     Wt Readings from Last 3 Encounters:  05/16/23 110 lb 6.4 oz (50.1 kg)  04/20/23 110 lb (49.9 kg)  04/14/23 107 lb 12.8 oz (48.9 kg)     ASSESSMENT & PLAN:   Nonobstructive CAD: She is doing well and without symptoms concerning for angina or cardiac decompensation.  On apixaban in place of aspirin given underlying A-fib.  Continue aggressive risk factor modification and primary prevention including losartan, metoprolol, and pravastatin.  HFimpEF: Euvolemic and well compensated.  NYHA class is difficult to assess secondary to underlying pulmonary disease.  She remains on losartan 100 mg and Toprol-XL 50 mg.  With preserved LV systolic function, and in the context of no heart failure symptoms, defer escalation of GDMT at this time.  PAF: Maintaining sinus rhythm by physical exam on Toprol-XL 50 mg.  CHA2DS2-VASc 6 (CHF, HTN, age x 2, vascular disease, sex category).  Remains on apixaban  5 mg twice daily and does not meet reduced dosing criteria.  No falls or symptoms concerning for bleeding.  Recent labs stable.  Mitral regurgitation: Moderate by TEE in 04/2023.  Monitor with periodic echo.  HTN: Blood pressure is well-controlled in the office today.  She remains on losartan 100 mg and Toprol-XL 50 mg.  HLD: LDL 70 in 03/2021.  Remains on pravastatin 10 mg.  Check CMP, lipid panel, and direct LDL.     Disposition: F/u with Dr. Kirke Corin or an APP in 6 months.   Medication Adjustments/Labs and Tests Ordered: Current medicines are reviewed at length with the patient today.  Concerns regarding medicines are outlined above. Medication changes, Labs and Tests ordered  today are summarized above and listed in the Patient Instructions accessible in Encounters.   Signed, Eula Listen, PA-C 05/16/2023 11:22 AM     [facility] [place] [address] [place], [place] [address] [phone]

## 2023-05-16 NOTE — Patient Instructions (Signed)
Medication Instructions:  Your Physician recommend you continue on your current medication as directed.    *If you need a refill on your cardiac medications before your next appointment, please call your pharmacy*   Lab Work: Your provider would like for you to have following labs drawn today CMET, Lipid panel, and Direct LDL.   If you have labs (blood work) drawn today and your tests are completely normal, you will receive your results only by: MyChart Message (if you have MyChart) OR A paper copy in the mail If you have any lab test that is abnormal or we need to change your treatment, we will call you to review the results.   Follow-Up: At [facility], you and your health needs are our priority.  As part of our continuing mission to provide you with exceptional heart care, we have created designated Provider Care Teams.  These Care Teams include your primary Cardiologist (physician) and Advanced Practice Providers (APPs -  Physician Assistants and Nurse Practitioners) who all work together to provide you with the care you need, when you need it.  We recommend signing up for the patient portal called "MyChart".  Sign up information is provided on this After Visit Summary.  MyChart is used to connect with patients for Virtual Visits (Telemedicine).  Patients are able to view lab/test results, encounter notes, upcoming appointments, etc.  Non-urgent messages can be sent to your provider as well.   To learn more about what you can do with MyChart, go to [email or web address].    Your next appointment:   6 month(s)  Provider:   You may see Lorine Bears, MD or one of the following Advanced Practice Providers on your designated Care Team:   Eula Listen, [place]

## 2023-05-17 LAB — LIPID PANEL
Chol/HDL Ratio: 2.5 {ratio} (ref 0.0–4.4)
Cholesterol, Total: 159 mg/dL (ref 100–199)
HDL: 64 mg/dL (ref >39)
LDL Chol Calc (NIH): 74 mg/dL (ref 0–99)
Triglycerides: 121 mg/dL (ref 0–149)
VLDL Cholesterol Cal: 21 mg/dL (ref 5–40)

## 2023-05-17 LAB — COMPREHENSIVE METABOLIC PANEL
ALT: 21 [IU]/L (ref 0–32)
AST: 16 [IU]/L (ref 0–40)
Albumin: 3.7 g/dL — ABNORMAL LOW (ref 3.8–4.8)
Alkaline Phosphatase: 81 [IU]/L (ref 44–121)
BUN/Creatinine Ratio: 30 — ABNORMAL HIGH (ref 12–28)
BUN: 20 mg/dL (ref 8–27)
Bilirubin Total: 0.2 mg/dL (ref 0.0–1.2)
CO2: 23 mmol/L (ref 20–29)
Calcium: 9 mg/dL (ref 8.7–10.3)
Chloride: 103 mmol/L (ref 96–106)
Creatinine, Ser: 0.66 mg/dL (ref 0.57–1.00)
Globulin, Total: 2.2 g/dL (ref 1.5–4.5)
Glucose: 78 mg/dL (ref 70–99)
Potassium: 4.3 mmol/L (ref 3.5–5.2)
Sodium: 140 mmol/L (ref 134–144)
Total Protein: 5.9 g/dL — ABNORMAL LOW (ref 6.0–8.5)
eGFR: 91 mL/min/{1.73_m2} (ref >59)

## 2023-05-17 LAB — LDL CHOLESTEROL, DIRECT: LDL Direct: 71 mg/dL (ref 0–99)

## 2023-06-22 ENCOUNTER — Emergency Department: Payer: Medicare HMO

## 2023-06-22 ENCOUNTER — Emergency Department
Admission: EM | Admit: 2023-06-22 | Discharge: 2023-06-22 | Disposition: A | Discharge status: Home / Self Care | Payer: Medicare HMO | Source: Home / Self Care | Attending: Emergency Medicine | Admitting: Emergency Medicine

## 2023-06-22 ENCOUNTER — Other Ambulatory Visit: Payer: Self-pay

## 2023-06-22 ENCOUNTER — Encounter: Payer: Self-pay | Admitting: Emergency Medicine

## 2023-06-22 DIAGNOSIS — Z79899 Other long term (current) drug therapy: Secondary | ICD-10-CM | POA: Insufficient documentation

## 2023-06-22 DIAGNOSIS — I2510 Atherosclerotic heart disease of native coronary artery without angina pectoris: Secondary | ICD-10-CM | POA: Insufficient documentation

## 2023-06-22 DIAGNOSIS — J441 Chronic obstructive pulmonary disease with (acute) exacerbation: Secondary | ICD-10-CM | POA: Insufficient documentation

## 2023-06-22 DIAGNOSIS — J449 Chronic obstructive pulmonary disease, unspecified: Secondary | ICD-10-CM | POA: Insufficient documentation

## 2023-06-22 DIAGNOSIS — I509 Heart failure, unspecified: Secondary | ICD-10-CM | POA: Insufficient documentation

## 2023-06-22 DIAGNOSIS — R0602 Shortness of breath: Secondary | ICD-10-CM | POA: Diagnosis not present

## 2023-06-22 DIAGNOSIS — A419 Sepsis, unspecified organism: Secondary | ICD-10-CM | POA: Diagnosis not present

## 2023-06-22 LAB — BASIC METABOLIC PANEL
Anion gap: 13 (ref 5–15)
BUN: 23 mg/dL (ref 8–23)
CO2: 25 mmol/L (ref 22–32)
Calcium: 9.2 mg/dL (ref 8.9–10.3)
Chloride: 107 mmol/L (ref 98–111)
Creatinine, Ser: 0.89 mg/dL (ref 0.44–1.00)
GFR, Estimated: >60 mL/min (ref >60)
Glucose, Bld: 125 mg/dL — ABNORMAL HIGH (ref 70–99)
Potassium: 4 mmol/L (ref 3.5–5.1)
Sodium: 145 mmol/L (ref 135–145)

## 2023-06-22 LAB — HEPATIC FUNCTION PANEL
ALT: 57 U/L — ABNORMAL HIGH (ref 0–44)
AST: 72 U/L — ABNORMAL HIGH (ref 15–41)
Albumin: 3.4 g/dL — ABNORMAL LOW (ref 3.5–5.0)
Alkaline Phosphatase: 63 U/L (ref 38–126)
Bilirubin, Direct: 0.2 mg/dL (ref 0.0–0.2)
Indirect Bilirubin: 0.7 mg/dL (ref 0.3–0.9)
Total Bilirubin: 0.9 mg/dL (ref 0.0–1.2)
Total Protein: 6.3 g/dL — ABNORMAL LOW (ref 6.5–8.1)

## 2023-06-22 LAB — CBC
HCT: 36 % (ref 36.0–46.0)
Hemoglobin: 11.7 g/dL — ABNORMAL LOW (ref 12.0–15.0)
MCH: 34.4 pg — ABNORMAL HIGH (ref 26.0–34.0)
MCHC: 32.5 g/dL (ref 30.0–36.0)
MCV: 105.9 fL — ABNORMAL HIGH (ref 80.0–100.0)
Platelets: 339 10*3/uL (ref 150–400)
RBC: 3.4 MIL/uL — ABNORMAL LOW (ref 3.87–5.11)
RDW: 13.9 % (ref 11.5–15.5)
WBC: 10.5 10*3/uL (ref 4.0–10.5)
nRBC: 0 % (ref 0.0–0.2)

## 2023-06-22 LAB — BLOOD GAS, VENOUS
Acid-Base Excess: 1.7 mmol/L (ref 0.0–2.0)
Bicarbonate: 27.7 mmol/L (ref 20.0–28.0)
O2 Saturation: 60.2 %
Patient temperature: 37
pCO2, Ven: 48 mm[Hg] (ref 44–60)
pH, Ven: 7.37 (ref 7.25–7.43)
pO2, Ven: 37 mm[Hg] (ref 32–45)

## 2023-06-22 LAB — TROPONIN I (HIGH SENSITIVITY)
Troponin I (High Sensitivity): 36 ng/L — ABNORMAL HIGH
Troponin I (High Sensitivity): 25 ng/L — ABNORMAL HIGH (ref <18)

## 2023-06-22 LAB — BRAIN NATRIURETIC PEPTIDE: B Natriuretic Peptide: 1169.8 pg/mL — ABNORMAL HIGH (ref 0.0–100.0)

## 2023-06-22 MED ORDER — PREDNISONE 20 MG PO TABS
60.0000 mg | ORAL_TABLET | Freq: Once | ORAL | Status: AC
  Administered 2023-06-22: 60 mg via ORAL
  Filled 2023-06-22: qty 3

## 2023-06-22 MED ORDER — PREDNISONE 20 MG PO TABS: 40.0000 mg | ORAL_TABLET | Freq: Every day | ORAL | 0 refills | Status: DC

## 2023-06-22 MED ORDER — IPRATROPIUM-ALBUTEROL 0.5-2.5 (3) MG/3ML IN SOLN
3.0000 mL | Freq: Once | RESPIRATORY_TRACT | Status: AC
  Administered 2023-06-22: 3 mL via RESPIRATORY_TRACT
  Filled 2023-06-22: qty 3

## 2023-06-22 MED ORDER — ALBUTEROL SULFATE HFA 108 (90 BASE) MCG/ACT IN AERS: 2.0000 | INHALATION_SPRAY | Freq: Four times a day (QID) | RESPIRATORY_TRACT | 2 refills | Status: AC | PRN

## 2023-06-22 MED ORDER — DOXYCYCLINE HYCLATE 100 MG PO TABS: 100.0000 mg | ORAL_TABLET | Freq: Two times a day (BID) | ORAL | 0 refills | Status: DC

## 2023-06-22 MED ORDER — DOXYCYCLINE HYCLATE 100 MG PO TABS
100.0000 mg | ORAL_TABLET | Freq: Once | ORAL | Status: AC
  Administered 2023-06-22: 100 mg via ORAL
  Filled 2023-06-22: qty 1

## 2023-06-22 NOTE — ED Triage Notes (Signed)
Patient to ED via POV for SOB. Ongoing since the end of December. States wears 2L [place] at baseline. Hx COPD. Speaking in full sentences without difficulty. Pt states she has been getting bad night sweats.

## 2023-06-22 NOTE — ED Provider Notes (Signed)
[name] Provider Note    Event Date/Time   First MD Initiated Contact with Patient 06/22/23 1244     (approximate)   History   Shortness of Breath   HPI  Taylor Chambers is a 78 y.o. female history of COPD on 2 L nasal cannula at baseline, CHF on Lasix, CAD, hyperlipidemia, presenting with shortness of breath and cough since Christmas.  States that she thought she had a flu or cold that did not resolve.  States that she felt hot at home but did not take her temperature.  No nausea or vomiting or diarrhea, no back pain or unilateral calf swelling or tenderness, no chest pain.  States no prior history of blood clots, does not had any recent travel or surgery, malignancy.  Per husband, she has been intermittently flushed but they have not taken a temperature, says that helps when he puts a cold towel on her head.   On independent review of office visits to cardiology, patient has history of nonobstructive CAD, paroxysmal A-fib, history of mitral regurg, COPD, CHF, is on apixaban for A-fib.  Physical Exam   Triage Vital Signs: ED Triage Vitals  Encounter Vitals Group     BP 06/22/23 0940 (!) 134/59     Systolic BP Percentile --      Diastolic BP Percentile --      Pulse Rate 06/22/23 0940 81     Resp 06/22/23 0940 18     Temp 06/22/23 0940 97.6 F (36.4 C)     Temp Source 06/22/23 0940 Oral     SpO2 06/22/23 0940 100 %     Weight 06/22/23 0938 109 lb (49.4 kg)     Height 06/22/23 0938 5\' 2"  (1.575 m)     Head Circumference --      Peak Flow --      Pain Score 06/22/23 0938 0     Pain Loc --      Pain Education --      Exclude from Growth Chart --     Most recent vital signs: Vitals:   06/22/23 0940 06/22/23 1348  BP: (!) 134/59 130/60  Pulse: 81 78  Resp: 18 18  Temp: 97.6 F (36.4 C) 98 F (36.7 C)  SpO2: 100% 100%     General: Awake, no distress.  CV:  Good peripheral perfusion.  Resp:  Normal effort.  Diminished bilaterally Abd:  No  distention.  Soft nontender Other:  No lower extremity edema, no unilateral calf swelling or tenderness.   ED Results / Procedures / Treatments   Labs (all labs ordered are listed, but only abnormal results are displayed) Labs Reviewed  BASIC METABOLIC PANEL - Abnormal; Notable for the following components:      Result Value   Glucose, Bld 125 (*)    All other components within normal limits  CBC - Abnormal; Notable for the following components:   RBC 3.40 (*)    Hemoglobin 11.7 (*)    MCV 105.9 (*)    MCH 34.4 (*)    All other components within normal limits  BRAIN NATRIURETIC PEPTIDE - Abnormal; Notable for the following components:   B Natriuretic Peptide 1,169.8 (*)    All other components within normal limits  HEPATIC FUNCTION PANEL - Abnormal; Notable for the following components:   Total Protein 6.3 (*)    Albumin 3.4 (*)    AST 72 (*)    ALT 57 (*)    All  other components within normal limits  TROPONIN I (HIGH SENSITIVITY) - Abnormal; Notable for the following components:   Troponin I (High Sensitivity) 36 (*)    All other components within normal limits  TROPONIN I (HIGH SENSITIVITY) - Abnormal; Notable for the following components:   Troponin I (High Sensitivity) 25 (*)    All other components within normal limits  BLOOD GAS, VENOUS     EKG  Sinus rhythm with premature supraventricular complexes, rate 88, normal QRS, normal QTc, T wave inversion to aVL, V2, no ischemic ST elevation, no ischemic ST depression, T wave inversions new compared to prior   RADIOLOGY Chest x-ray on my interpretation without focal consolidation   PROCEDURES:  Critical Care performed: No  Ultrasound ED Echo  Date/Time: 06/22/2023 1:08 PM  Performed by: Claybon Jabs, MD Authorized by: Claybon Jabs, MD   Procedure details:    Indications: dyspnea     Views: parasternal long axis view and IVC view     Images: not archived   Findings:    Pericardium: no pericardial effusion      IVC: normal   Impression:    Impression: normal   Ultrasound ED Thoracic  Date/Time: 06/22/2023 1:08 PM  Performed by: Claybon Jabs, MD Authorized by: Claybon Jabs, MD   Procedure details:    Indications: dyspnea     Assessment for:  Interstitial syndrome   Left lung pleural:  Visualized   Right lung pleural:  Visualized   Images: not archived   Findings:    A-lines noted throughout: identified     B-lines noted throughout: not identified   Impression:    Impression: none      MEDICATIONS ORDERED IN ED: Medications  ipratropium-albuterol (DUONEB) 0.5-2.5 (3) MG/3ML nebulizer solution 3 mL (3 mLs Nebulization Given 06/22/23 1322)  ipratropium-albuterol (DUONEB) 0.5-2.5 (3) MG/3ML nebulizer solution 3 mL (3 mLs Nebulization Given 06/22/23 1322)  ipratropium-albuterol (DUONEB) 0.5-2.5 (3) MG/3ML nebulizer solution 3 mL (3 mLs Nebulization Given 06/22/23 1322)  predniSONE (DELTASONE) tablet 60 mg (60 mg Oral Given 06/22/23 1323)  doxycycline (VIBRA-TABS) tablet 100 mg (100 mg Oral Given 06/22/23 1323)     IMPRESSION / MDM / ASSESSMENT AND PLAN / ED COURSE  I reviewed the triage vital signs and the nursing notes.                              Differential diagnosis includes, but is not limited to, COPD exacerbation, CHF, viral illness, influenza, RSV, COVID, pneumonia, ACS, considered but doubt PE given that she has no other risk factors.,  No recent travel or surgeries, no history of blood clots, she is on apixaban.  Patient's presentation is most consistent with acute presentation with potential threat to life or bodily function.  Given that she has very diminished breath sounds bilaterally, also no evidence of fluid overload on the echo, we will treat her as a COPD exacerbation and give her 3 DuoNebs as well as Solu-Medrol, doxycycline, will reassess.  Independent review of labs imaging, VBG is normal, BNP is mildly elevated and chest x-ray showed mild blunting of the  costophrenic angles that could be scarring versus small pleural effusion.  On ultrasound, she is not fluid overloaded, doubt this is the reason for her symptoms.  He has no leukocytosis, her electrolytes not severely deranged, her LFTs are mildly elevated, possibly in the setting of a viral illness, she has no right upper  quadrant tenderness, no jaundice to warrant further workup in the emergency department.  On reassessment patient would like to go home, says she is feeling a lot better after the DuoNeb's.  Shared decision making with patient and she is agreeable with plan to go, she will follow-up with her primary care doctor for further management of her symptoms, we will send her home with a refill of her albuterol, 4 more days of prednisone as well as a course of doxycycline.  Strict return precautions given.      FINAL CLINICAL IMPRESSION(S) / ED DIAGNOSES   Final diagnoses:  COPD exacerbation (HCC)  SOB (shortness of breath)     Rx / DC Orders   ED Discharge Orders          Ordered    predniSONE (DELTASONE) 20 MG tablet  Daily with breakfast        06/22/23 1455    albuterol (VENTOLIN HFA) 108 (90 Base) MCG/ACT inhaler  Every 6 hours PRN        06/22/23 1455    doxycycline (VIBRA-TABS) 100 MG tablet  2 times daily        06/22/23 1455             Note:  This document was prepared using Dragon voice recognition software and may include unintentional dictation errors.     Claybon Jabs, MD 06/22/23 [phone]

## 2023-06-22 NOTE — ED Provider Triage Note (Signed)
Emergency Medicine Provider Triage Evaluation Note  Taylor Chambers , a 78 y.o. female  was evaluated in triage.  Pt complains of shortness of breath, fatigue, weakness, was sick in December around December 24 and has not felt better since then.  Husband states she is getting worse.  Having more difficulty breathing at night.  Patient chronically on 2 L.  Review of Systems  Positive:  Negative:   Physical Exam  Ht 5\' 2"  (1.575 m)   Wt 49.4 kg   BMI 19.94 kg/m  Gen:   Awake, no distress   Resp:  Normal effort  MSK:   Moves extremities without difficulty  Other:    Medical Decision Making  Medically screening exam initiated at 9:40 AM.  Appropriate orders placed.  Akshara Raxter was informed that the remainder of the evaluation will be completed by another provider, this initial triage assessment does not replace that evaluation, and the importance of remaining in the ED until their evaluation is complete.     Faythe Ghee, PA-C 06/22/23 [phone]

## 2023-06-22 NOTE — Discharge Instructions (Addendum)
Please take the prednisone for the next 4 days.  Please take the antibiotics as prescribed.  Please follow-up with your primary care doctor for further management of your symptoms.  Please return if they recur or you feeling worse.

## 2023-06-24 ENCOUNTER — Other Ambulatory Visit: Payer: Self-pay

## 2023-06-24 ENCOUNTER — Inpatient Hospital Stay: Payer: Medicare HMO

## 2023-06-24 ENCOUNTER — Emergency Department: Payer: Medicare HMO

## 2023-06-24 ENCOUNTER — Inpatient Hospital Stay
Admission: EM | Admit: 2023-06-24 | Discharge: 2023-06-28 | DRG: 871 | Disposition: A | Discharge status: Home Health | Payer: Medicare HMO | Attending: Obstetrics and Gynecology | Admitting: Obstetrics and Gynecology

## 2023-06-24 DIAGNOSIS — F17210 Nicotine dependence, cigarettes, uncomplicated: Secondary | ICD-10-CM | POA: Diagnosis present

## 2023-06-24 DIAGNOSIS — I081 Rheumatic disorders of both mitral and tricuspid valves: Secondary | ICD-10-CM | POA: Diagnosis present

## 2023-06-24 DIAGNOSIS — I4891 Unspecified atrial fibrillation: Secondary | ICD-10-CM

## 2023-06-24 DIAGNOSIS — J9621 Acute and chronic respiratory failure with hypoxia: Secondary | ICD-10-CM | POA: Diagnosis present

## 2023-06-24 DIAGNOSIS — J90 Pleural effusion, not elsewhere classified: Secondary | ICD-10-CM | POA: Diagnosis not present

## 2023-06-24 DIAGNOSIS — I340 Nonrheumatic mitral (valve) insufficiency: Secondary | ICD-10-CM | POA: Diagnosis present

## 2023-06-24 DIAGNOSIS — J439 Emphysema, unspecified: Secondary | ICD-10-CM | POA: Diagnosis present

## 2023-06-24 DIAGNOSIS — I480 Paroxysmal atrial fibrillation: Secondary | ICD-10-CM | POA: Diagnosis present

## 2023-06-24 DIAGNOSIS — J9811 Atelectasis: Secondary | ICD-10-CM | POA: Diagnosis present

## 2023-06-24 DIAGNOSIS — J9601 Acute respiratory failure with hypoxia: Secondary | ICD-10-CM | POA: Diagnosis present

## 2023-06-24 DIAGNOSIS — E785 Hyperlipidemia, unspecified: Secondary | ICD-10-CM | POA: Diagnosis present

## 2023-06-24 DIAGNOSIS — J440 Chronic obstructive pulmonary disease with acute lower respiratory infection: Secondary | ICD-10-CM | POA: Diagnosis present

## 2023-06-24 DIAGNOSIS — G43909 Migraine, unspecified, not intractable, without status migrainosus: Secondary | ICD-10-CM | POA: Diagnosis present

## 2023-06-24 DIAGNOSIS — I2583 Coronary atherosclerosis due to lipid rich plaque: Secondary | ICD-10-CM | POA: Diagnosis not present

## 2023-06-24 DIAGNOSIS — I110 Hypertensive heart disease with heart failure: Secondary | ICD-10-CM | POA: Diagnosis present

## 2023-06-24 DIAGNOSIS — H3530 Unspecified macular degeneration: Secondary | ICD-10-CM | POA: Diagnosis present

## 2023-06-24 DIAGNOSIS — F17200 Nicotine dependence, unspecified, uncomplicated: Secondary | ICD-10-CM | POA: Diagnosis present

## 2023-06-24 DIAGNOSIS — M810 Age-related osteoporosis without current pathological fracture: Secondary | ICD-10-CM | POA: Diagnosis present

## 2023-06-24 DIAGNOSIS — I5033 Acute on chronic diastolic (congestive) heart failure: Secondary | ICD-10-CM | POA: Diagnosis present

## 2023-06-24 DIAGNOSIS — J441 Chronic obstructive pulmonary disease with (acute) exacerbation: Secondary | ICD-10-CM | POA: Diagnosis present

## 2023-06-24 DIAGNOSIS — J449 Chronic obstructive pulmonary disease, unspecified: Secondary | ICD-10-CM | POA: Insufficient documentation

## 2023-06-24 DIAGNOSIS — J9602 Acute respiratory failure with hypercapnia: Principal | ICD-10-CM | POA: Diagnosis present

## 2023-06-24 DIAGNOSIS — R7989 Other specified abnormal findings of blood chemistry: Secondary | ICD-10-CM | POA: Diagnosis not present

## 2023-06-24 DIAGNOSIS — Z681 Body mass index (BMI) 19 or less, adult: Secondary | ICD-10-CM

## 2023-06-24 DIAGNOSIS — Z7901 Long term (current) use of anticoagulants: Secondary | ICD-10-CM

## 2023-06-24 DIAGNOSIS — E876 Hypokalemia: Secondary | ICD-10-CM | POA: Diagnosis present

## 2023-06-24 DIAGNOSIS — I471 Supraventricular tachycardia, unspecified: Secondary | ICD-10-CM | POA: Diagnosis present

## 2023-06-24 DIAGNOSIS — Z96641 Presence of right artificial hip joint: Secondary | ICD-10-CM | POA: Diagnosis present

## 2023-06-24 DIAGNOSIS — I2510 Atherosclerotic heart disease of native coronary artery without angina pectoris: Secondary | ICD-10-CM | POA: Diagnosis not present

## 2023-06-24 DIAGNOSIS — R0602 Shortness of breath: Secondary | ICD-10-CM | POA: Diagnosis present

## 2023-06-24 DIAGNOSIS — E440 Moderate protein-calorie malnutrition: Secondary | ICD-10-CM | POA: Diagnosis present

## 2023-06-24 DIAGNOSIS — I4892 Unspecified atrial flutter: Secondary | ICD-10-CM | POA: Diagnosis present

## 2023-06-24 DIAGNOSIS — G9341 Metabolic encephalopathy: Secondary | ICD-10-CM | POA: Diagnosis not present

## 2023-06-24 DIAGNOSIS — J189 Pneumonia, unspecified organism: Secondary | ICD-10-CM | POA: Diagnosis present

## 2023-06-24 DIAGNOSIS — I700 Atherosclerosis of aorta: Secondary | ICD-10-CM | POA: Diagnosis present

## 2023-06-24 DIAGNOSIS — A419 Sepsis, unspecified organism: Principal | ICD-10-CM | POA: Diagnosis present

## 2023-06-24 DIAGNOSIS — F319 Bipolar disorder, unspecified: Secondary | ICD-10-CM | POA: Diagnosis present

## 2023-06-24 DIAGNOSIS — E8729 Other acidosis: Secondary | ICD-10-CM | POA: Diagnosis present

## 2023-06-24 DIAGNOSIS — I5031 Acute diastolic (congestive) heart failure: Secondary | ICD-10-CM | POA: Diagnosis not present

## 2023-06-24 DIAGNOSIS — E43 Unspecified severe protein-calorie malnutrition: Secondary | ICD-10-CM | POA: Diagnosis present

## 2023-06-24 DIAGNOSIS — I493 Ventricular premature depolarization: Secondary | ICD-10-CM | POA: Diagnosis present

## 2023-06-24 DIAGNOSIS — R001 Bradycardia, unspecified: Secondary | ICD-10-CM | POA: Diagnosis not present

## 2023-06-24 DIAGNOSIS — R739 Hyperglycemia, unspecified: Secondary | ICD-10-CM | POA: Insufficient documentation

## 2023-06-24 DIAGNOSIS — F419 Anxiety disorder, unspecified: Secondary | ICD-10-CM | POA: Diagnosis present

## 2023-06-24 DIAGNOSIS — Z1152 Encounter for screening for COVID-19: Secondary | ICD-10-CM | POA: Diagnosis not present

## 2023-06-24 DIAGNOSIS — J9622 Acute and chronic respiratory failure with hypercapnia: Secondary | ICD-10-CM | POA: Diagnosis present

## 2023-06-24 DIAGNOSIS — I10 Essential (primary) hypertension: Secondary | ICD-10-CM | POA: Diagnosis not present

## 2023-06-24 DIAGNOSIS — Z888 Allergy status to other drugs, medicaments and biological substances status: Secondary | ICD-10-CM

## 2023-06-24 DIAGNOSIS — Z79899 Other long term (current) drug therapy: Secondary | ICD-10-CM

## 2023-06-24 DIAGNOSIS — I441 Atrioventricular block, second degree: Secondary | ICD-10-CM | POA: Diagnosis present

## 2023-06-24 DIAGNOSIS — R5381 Other malaise: Secondary | ICD-10-CM | POA: Diagnosis present

## 2023-06-24 DIAGNOSIS — D72829 Elevated white blood cell count, unspecified: Secondary | ICD-10-CM | POA: Diagnosis present

## 2023-06-24 LAB — CBC WITH DIFFERENTIAL/PLATELET
Abs Immature Granulocytes: 0.24 10*3/uL — ABNORMAL HIGH (ref 0.00–0.07)
Basophils Absolute: 0.1 10*3/uL (ref 0.0–0.1)
Basophils Relative: 0 %
Eosinophils Absolute: 0 10*3/uL (ref 0.0–0.5)
Eosinophils Relative: 0 %
HCT: 39.9 % (ref 36.0–46.0)
Hemoglobin: 12.5 g/dL (ref 12.0–15.0)
Immature Granulocytes: 1 %
Lymphocytes Relative: 14 %
Lymphs Abs: 2.5 10*3/uL (ref 0.7–4.0)
MCH: 34.1 pg — ABNORMAL HIGH (ref 26.0–34.0)
MCHC: 31.3 g/dL (ref 30.0–36.0)
MCV: 108.7 fL — ABNORMAL HIGH (ref 80.0–100.0)
Monocytes Absolute: 1.6 10*3/uL — ABNORMAL HIGH (ref 0.1–1.0)
Monocytes Relative: 9 %
Neutro Abs: 14 10*3/uL — ABNORMAL HIGH (ref 1.7–7.7)
Neutrophils Relative %: 76 %
Platelets: 444 10*3/uL — ABNORMAL HIGH (ref 150–400)
RBC: 3.67 MIL/uL — ABNORMAL LOW (ref 3.87–5.11)
RDW: 14.1 % (ref 11.5–15.5)
WBC: 18.5 10*3/uL — ABNORMAL HIGH (ref 4.0–10.5)
nRBC: 0.2 % (ref 0.0–0.2)

## 2023-06-24 LAB — COMPREHENSIVE METABOLIC PANEL
ALT: 86 U/L — ABNORMAL HIGH (ref 0–44)
AST: 97 U/L — ABNORMAL HIGH (ref 15–41)
Albumin: 3.4 g/dL — ABNORMAL LOW (ref 3.5–5.0)
Alkaline Phosphatase: 68 U/L (ref 38–126)
Anion gap: 15 (ref 5–15)
BUN: 29 mg/dL — ABNORMAL HIGH (ref 8–23)
CO2: 24 mmol/L (ref 22–32)
Calcium: 8.6 mg/dL — ABNORMAL LOW (ref 8.9–10.3)
Chloride: 103 mmol/L (ref 98–111)
Creatinine, Ser: 0.87 mg/dL (ref 0.44–1.00)
GFR, Estimated: >60 mL/min (ref >60)
Glucose, Bld: 349 mg/dL — ABNORMAL HIGH (ref 70–99)
Potassium: 4 mmol/L (ref 3.5–5.1)
Sodium: 142 mmol/L (ref 135–145)
Total Bilirubin: 0.8 mg/dL (ref 0.0–1.2)
Total Protein: 6.4 g/dL — ABNORMAL LOW (ref 6.5–8.1)

## 2023-06-24 LAB — PROTIME-INR
INR: 1.4 — ABNORMAL HIGH (ref 0.8–1.2)
Prothrombin Time: 17.4 s — ABNORMAL HIGH (ref 11.4–15.2)

## 2023-06-24 LAB — LACTIC ACID, PLASMA
Lactic Acid, Venous: 4.6 mmol/L (ref 0.5–1.9)
Lactic Acid, Venous: 3.2 mmol/L (ref 0.5–1.9)

## 2023-06-24 LAB — PROCALCITONIN: Procalcitonin: <0.1 ng/mL

## 2023-06-24 LAB — CBG MONITORING, ED
Glucose-Capillary: 115 mg/dL — ABNORMAL HIGH (ref 70–99)
Glucose-Capillary: 118 mg/dL — ABNORMAL HIGH (ref 70–99)

## 2023-06-24 LAB — TROPONIN I (HIGH SENSITIVITY)
Troponin I (High Sensitivity): 31 ng/L — ABNORMAL HIGH (ref <18)
Troponin I (High Sensitivity): 32 ng/L — ABNORMAL HIGH (ref <18)

## 2023-06-24 LAB — RESP PANEL BY RT-PCR (RSV, FLU A&B, COVID)  RVPGX2
Influenza A by PCR: NEGATIVE
Influenza B by PCR: NEGATIVE
Resp Syncytial Virus by PCR: NEGATIVE
SARS Coronavirus 2 by RT PCR: NEGATIVE

## 2023-06-24 LAB — STREP PNEUMONIAE URINARY ANTIGEN: Strep Pneumo Urinary Antigen: NEGATIVE

## 2023-06-24 LAB — BLOOD GAS, VENOUS
Acid-base deficit: 3.8 mmol/L — ABNORMAL HIGH (ref 0.0–2.0)
Bicarbonate: 26.1 mmol/L (ref 20.0–28.0)
Delivery systems: POSITIVE
FIO2: 60 %
O2 Saturation: 55.4 %
Patient temperature: 37
pCO2, Ven: 70 mm[Hg] — ABNORMAL HIGH (ref 44–60)
pH, Ven: 7.18 — CL (ref 7.25–7.43)
pO2, Ven: 38 mm[Hg] (ref 32–45)

## 2023-06-24 LAB — D-DIMER, QUANTITATIVE: D-Dimer, Quant: 1.63 ug{FEU}/mL — ABNORMAL HIGH (ref 0.00–0.50)

## 2023-06-24 LAB — BRAIN NATRIURETIC PEPTIDE: B Natriuretic Peptide: 1629.4 pg/mL — ABNORMAL HIGH (ref 0.0–100.0)

## 2023-06-24 LAB — APTT: aPTT: 26 s (ref 24–36)

## 2023-06-24 MED ORDER — INSULIN ASPART 100 UNIT/ML IJ SOLN
4.0000 [IU] | Freq: Three times a day (TID) | INTRAMUSCULAR | Status: DC
  Administered 2023-06-24 (×2): 4 [IU] via SUBCUTANEOUS
  Filled 2023-06-24 (×3): qty 1

## 2023-06-24 MED ORDER — PREDNISONE 20 MG PO TABS
40.0000 mg | ORAL_TABLET | Freq: Every day | ORAL | Status: DC
  Administered 2023-06-25: 40 mg via ORAL
  Filled 2023-06-24 (×2): qty 2

## 2023-06-24 MED ORDER — IPRATROPIUM-ALBUTEROL 0.5-2.5 (3) MG/3ML IN SOLN
3.0000 mL | Freq: Four times a day (QID) | RESPIRATORY_TRACT | Status: DC
  Administered 2023-06-24 – 2023-06-25 (×6): 3 mL via RESPIRATORY_TRACT
  Filled 2023-06-24 (×7): qty 3

## 2023-06-24 MED ORDER — SODIUM CHLORIDE 0.9 % IV SOLN
2.0000 g | INTRAVENOUS | Status: DC
  Administered 2023-06-25: 2 g via INTRAVENOUS
  Filled 2023-06-24: qty 20

## 2023-06-24 MED ORDER — IPRATROPIUM-ALBUTEROL 0.5-2.5 (3) MG/3ML IN SOLN
3.0000 mL | Freq: Once | RESPIRATORY_TRACT | Status: AC
  Administered 2023-06-24: 3 mL via RESPIRATORY_TRACT
  Filled 2023-06-24: qty 3

## 2023-06-24 MED ORDER — METOPROLOL TARTRATE 5 MG/5ML IV SOLN: 5.0000 mg | Freq: Once | INTRAVENOUS | Status: AC

## 2023-06-24 MED ORDER — APIXABAN 5 MG PO TABS
5.0000 mg | ORAL_TABLET | Freq: Two times a day (BID) | ORAL | Status: DC
Start: 2023-06-24 — End: 2023-06-28
  Administered 2023-06-24 – 2023-06-28 (×7): 5 mg via ORAL
  Filled 2023-06-24 (×8): qty 1

## 2023-06-24 MED ORDER — METOPROLOL SUCCINATE ER 50 MG PO TB24
50.0000 mg | ORAL_TABLET | Freq: Every day | ORAL | Status: DC
  Administered 2023-06-24: 50 mg via ORAL
  Filled 2023-06-24: qty 1

## 2023-06-24 MED ORDER — SODIUM CHLORIDE 0.9 % IV SOLN
2.0000 g | Freq: Once | INTRAVENOUS | Status: AC
  Administered 2023-06-24: 2 g via INTRAVENOUS
  Filled 2023-06-24: qty 12.5

## 2023-06-24 MED ORDER — METHYLPREDNISOLONE SODIUM SUCC 40 MG IJ SOLR
40.0000 mg | Freq: Two times a day (BID) | INTRAMUSCULAR | Status: AC
  Administered 2023-06-24 – 2023-06-25 (×2): 40 mg via INTRAVENOUS
  Filled 2023-06-24 (×2): qty 1

## 2023-06-24 MED ORDER — VANCOMYCIN HCL IN DEXTROSE 1-5 GM/200ML-% IV SOLN
1000.0000 mg | Freq: Once | INTRAVENOUS | Status: AC
  Administered 2023-06-24: 1000 mg via INTRAVENOUS
  Filled 2023-06-24: qty 200

## 2023-06-24 MED ORDER — METOPROLOL TARTRATE 5 MG/5ML IV SOLN
INTRAVENOUS | Status: AC
  Administered 2023-06-24: 5 mg via INTRAVENOUS
  Filled 2023-06-24: qty 5

## 2023-06-24 MED ORDER — FUROSEMIDE 10 MG/ML IJ SOLN
40.0000 mg | Freq: Once | INTRAMUSCULAR | Status: AC
  Administered 2023-06-24: 40 mg via INTRAVENOUS
  Filled 2023-06-24: qty 4

## 2023-06-24 MED ORDER — AZITHROMYCIN 500 MG IV SOLR
500.0000 mg | INTRAVENOUS | Status: DC
  Administered 2023-06-25: 500 mg via INTRAVENOUS
  Filled 2023-06-24: qty 5

## 2023-06-24 MED ORDER — LACTATED RINGERS IV SOLN: INTRAVENOUS | Status: AC

## 2023-06-24 MED ORDER — INSULIN ASPART 100 UNIT/ML IJ SOLN
0.0000 [IU] | Freq: Three times a day (TID) | INTRAMUSCULAR | Status: DC
  Administered 2023-06-25: 2 [IU] via SUBCUTANEOUS
  Administered 2023-06-26: 1 [IU] via SUBCUTANEOUS
  Filled 2023-06-24 (×2): qty 1

## 2023-06-24 MED ORDER — IOHEXOL 350 MG/ML SOLN
75.0000 mL | Freq: Once | INTRAVENOUS | Status: AC | PRN
  Administered 2023-06-24: 75 mL via INTRAVENOUS

## 2023-06-24 MED ORDER — METOPROLOL SUCCINATE ER 50 MG PO TB24
50.0000 mg | ORAL_TABLET | Freq: Every day | ORAL | Status: DC
  Administered 2023-06-25: 50 mg via ORAL
  Filled 2023-06-24: qty 1

## 2023-06-24 MED ORDER — ALBUTEROL SULFATE (2.5 MG/3ML) 0.083% IN NEBU: 2.5000 mg | INHALATION_SOLUTION | RESPIRATORY_TRACT | Status: DC | PRN

## 2023-06-24 MED ORDER — METOPROLOL TARTRATE 5 MG/5ML IV SOLN: 5.0000 mg | Freq: Once | INTRAVENOUS | Status: DC

## 2023-06-24 MED ORDER — ONDANSETRON HCL 4 MG PO TABS: 4.0000 mg | ORAL_TABLET | Freq: Four times a day (QID) | ORAL | Status: DC | PRN

## 2023-06-24 MED ORDER — ONDANSETRON HCL 4 MG/2ML IJ SOLN
4.0000 mg | Freq: Four times a day (QID) | INTRAMUSCULAR | Status: DC | PRN
  Administered 2023-06-26: 4 mg via INTRAVENOUS
  Filled 2023-06-24: qty 2

## 2023-06-24 NOTE — Assessment & Plan Note (Signed)
Patient reports no longer smoking Monitor

## 2023-06-24 NOTE — Assessment & Plan Note (Signed)
Moderate by TEE in 04/2023.  Monitor with periodic echo

## 2023-06-24 NOTE — Assessment & Plan Note (Signed)
BP stable Titrate home regimen 

## 2023-06-24 NOTE — Assessment & Plan Note (Signed)
Initially meeting sepsis criteria with heart rate 100s, respirations into the 30s now on BiPAP, white count 18 Noted?  Pneumonia on imaging Lactate 4.6-3.2 with diuresis in setting of volume overload Procalcitonin less than 0.10 Empirically started on pneumonia coverage with Rocephin and azithromycin Panculture  Defer LR maintenance IV fluids given volume status at present CT imaging pending to better assess lung pathology Monitor

## 2023-06-24 NOTE — Progress Notes (Signed)
Patient transported from the emergency department to CT and back to the emergency department. No issues with transport.

## 2023-06-24 NOTE — ED Provider Notes (Signed)
[facility] Provider Note    Event Date/Time   First MD Initiated Contact with Patient 06/24/23 [phone]     (approximate)   History   Respiratory Distress  EM caveat: Acute respiratory distress  HPI  Taylor Chambers is a 78 y.o. female history of COPD CHF coronary disease  Was evaluated in the ER a couple days ago and treated for COPD exacerbation and started on doxycycline  Patient has had progressively worsening shortness of breath.   Patient reports since Christmas she has had increasing shortness of breath.  She denies any pain, no chest pain specifically, reports "just can't breath"  Physical Exam   Triage Vital Signs: ED Triage Vitals  Encounter Vitals Group     BP 06/24/23 0534 (!) 175/90     Systolic BP Percentile --      Diastolic BP Percentile --      Pulse Rate 06/24/23 0534 (!) 104     Resp 06/24/23 0534 (!) 35     Temp 06/24/23 0534 (!) 97.1 F (36.2 C)     Temp Source 06/24/23 0534 Rectal     SpO2 06/24/23 0530 100 %     Weight --      Height --      Head Circumference --      Peak Flow --      Pain Score 06/24/23 0530 0     Pain Loc --      Pain Education --      Exclude from Growth Chart --     Most recent vital signs: Vitals:   06/24/23 0535 06/24/23 0645  BP: (!) 175/90 (!) 154/73  Pulse: 91 83  Resp: (!) 34 (!) 32  Temp:    SpO2: 99% 100%     General: Awake, with moderate respiratory distress and accessory muscle use currently on EMS CPAP CV:  Good peripheral perfusion.  Mild tachycardia no obvious murmur Resp:  Tachypnea, accessory muscle use, scant expiratory wheezing, diminished lung sounds throughout, questionable mild rales versus rhonchi in the lower lobes bilateral Abd:  No distention.  Soft nontender nondistended Other:  No lower extremity edema  No obvious JVD.  She does not appear to be acutely hypervolemic by clinical assessment of skin  After being transition to BiPAP the patient's respiratory rate  has improved minor accessory muscle use is improving.  She is tolerating BiPAP well taking minute ventilation is approximately 14 to 15 L/min.  Her work of breathing is improving  ED Results / Procedures / Treatments   Labs (all labs ordered are listed, but only abnormal results are displayed) Labs Reviewed  LACTIC ACID, PLASMA - Abnormal; Notable for the following components:      Result Value   Lactic Acid, Venous 4.6 (*)    All other components within normal limits  BLOOD GAS, VENOUS - Abnormal; Notable for the following components:   pH, Ven 7.18 (*)    pCO2, Ven 70 (*)    Acid-base deficit 3.8 (*)    All other components within normal limits  COMPREHENSIVE METABOLIC PANEL - Abnormal; Notable for the following components:   Glucose, Bld 349 (*)    BUN 29 (*)    Calcium 8.6 (*)    Total Protein 6.4 (*)    Albumin 3.4 (*)    AST 97 (*)    ALT 86 (*)    All other components within normal limits  CBC WITH DIFFERENTIAL/PLATELET - Abnormal; Notable for the following components:  WBC 18.5 (*)    RBC 3.67 (*)    MCV 108.7 (*)    MCH 34.1 (*)    Platelets 444 (*)    Neutro Abs 14.0 (*)    Monocytes Absolute 1.6 (*)    Abs Immature Granulocytes 0.24 (*)    All other components within normal limits  PROTIME-INR - Abnormal; Notable for the following components:   Prothrombin Time 17.4 (*)    INR 1.4 (*)    All other components within normal limits  BRAIN NATRIURETIC PEPTIDE - Abnormal; Notable for the following components:   B Natriuretic Peptide 1,629.4 (*)    All other components within normal limits  TROPONIN I (HIGH SENSITIVITY) - Abnormal; Notable for the following components:   Troponin I (High Sensitivity) 31 (*)    All other components within normal limits  CULTURE, BLOOD (ROUTINE X 2)  CULTURE, BLOOD (ROUTINE X 2)  RESP PANEL BY RT-PCR (RSV, FLU A&B, COVID)  RVPGX2  APTT  LACTIC ACID, PLASMA  PROCALCITONIN  D-DIMER, QUANTITATIVE  TROPONIN I (HIGH SENSITIVITY)      EKG  Interpreted by me at 540 heart rate 95 QRS 90 QTc 440 Normal sinus rhythm, nonspecific T wave abnormality no evidence of acute ischemia   RADIOLOGY   Chest x-ray interpreted by me as left-sided multifocal pneumonia versus vascular congestion/pulmonary edema.  Right lung appears better aerated compared to the left   DG Chest Port 1 View Result Date: 06/24/2023 CLINICAL DATA:  Shortness of breath EXAM: PORTABLE CHEST 1 VIEW COMPARISON:  Two days prior FINDINGS: Interval diffuse reticular nodular opacity, although asymmetric to the left, with small pleural effusions. Normal heart size and mediastinal contours. Extensive artifact from EKG leads. IMPRESSION: New reticulonodular opacity asymmetric to the left lung with small pleural effusions, asymmetry favoring pneumonia over edema. Electronically Signed   By: Tiburcio Pea M.D.   On: 06/24/2023 06:09      PROCEDURES:  Critical Care performed: Yes, see critical care procedure note(s)  CRITICAL CARE Performed by: Sharyn Creamer   Total critical care time: 30 minutes  Critical care time was exclusive of separately billable procedures and treating other patients.  Critical care was necessary to treat or prevent imminent or life-threatening deterioration.  Critical care was time spent personally by me on the following activities: development of treatment plan with patient and/or surrogate as well as nursing, discussions with consultants, evaluation of patient's response to treatment, examination of patient, obtaining history from patient or surrogate, ordering and performing treatments and interventions, ordering and review of laboratory studies, ordering and review of radiographic studies, pulse oximetry and re-evaluation of patient's condition.   Procedures   MEDICATIONS ORDERED IN ED: Medications  lactated ringers infusion ( Intravenous New Bag/Given 06/24/23 0631)  ipratropium-albuterol (DUONEB) 0.5-2.5 (3) MG/3ML  nebulizer solution 3 mL (3 mLs Nebulization Given 06/24/23 0624)  ipratropium-albuterol (DUONEB) 0.5-2.5 (3) MG/3ML nebulizer solution 3 mL (3 mLs Nebulization Given 06/24/23 0624)  vancomycin (VANCOCIN) IVPB 1000 mg/200 mL premix (1,000 mg Intravenous New Bag/Given 06/24/23 0630)  ceFEPIme (MAXIPIME) 2 g in sodium chloride 0.9 % 100 mL IVPB (0 g Intravenous Stopped 06/24/23 0727)  furosemide (LASIX) injection 40 mg (40 mg Intravenous Given 06/24/23 0727)     IMPRESSION / MDM / ASSESSMENT AND PLAN / ED COURSE  I reviewed the triage vital signs and the nursing notes.  Differential diagnosis includes, but is not limited to, possible COPD pneumonia CHF, less likely coronary syndrome thromboembolism, pneumothorax etc.  She was seen evaluate in the ER just a couple of days ago felt to be likely suffering from COPD exacerbation started doxycycline.  She is not febrile at present, but she obviously has severely increased work of breathing, improving notably after a few minutes on BiPAP.  Will be monitoring her quite closely but at this point I suspect that she will improve markedly with BiPAP therapy  She is notably hypertensive but also appears anxious.  CHF is a strong consideration, but await further testing and x-ray for further evaluation.  At this time I am more inclined to believe this is likely some sort of pulmonary infectious illness or COPD exacerbation but await further  Patient's presentation is most consistent with acute presentation with potential threat to life or bodily function.   The patient is on the cardiac monitor to evaluate for evidence of arrhythmia and/or significant heart rate changes.  ----------------------------------------- 7:31 AM on 06/24/2023 ----------------------------------------- Patient doing very well now.  She is respirating quite comfortably on BiPAP able to speak with almost complete sentences at this time.  She appears much improved  and reports her work of breathing feels much better.  I am suspicious that she has underlying volume overload, her BNP is elevated she is hypertensive and question if she may have some unilateral edema on x-ray versus pneumonia or possibly combination of both.  Will diurese and see if this improves her work of breathing as well.  Plan admission to the hospital, Dr. Lenard Lance to follow-up with admitting service to request admission.  Patient and her family at the bedside all understanding agreeable with plan for admission.  Patient asked at 1 point now that she is feeling so much better if she could possibly be discharged, I strongly advised against that, and after discussing further the degree of her symptomatology and recent clinical course she is very much agreeable with admission       FINAL CLINICAL IMPRESSION(S) / ED DIAGNOSES   Final diagnoses:  Acute hypercapnic respiratory failure (HCC)     Rx / DC Orders   ED Discharge Orders     None        Note:  This document was prepared using Dragon voice recognition software and may include unintentional dictation errors.   Sharyn Creamer, MD 06/24/23 [phone]

## 2023-06-24 NOTE — H&P (Signed)
History and Physical    Patient: Taylor Chambers ZOX:[id number] DOB: 22-Aug-1945 DOA: 06/24/2023 DOS: the patient was seen and examined on 06/24/2023 PCP: Mick Sell, MD  Patient coming from: Home  Chief Complaint:  Chief Complaint  Patient presents with   Respiratory Distress   HPI: Taylor Chambers is a 78 y.o. female with medical history significant of COPD, CAD, atrial fibrillation, HFpEF, mitral insufficiency, hyperlipidemia, hypertension, tobacco abuse presenting with acute respiratory failure with hypoxia, acute on chronic HFpEF, COPD exacerbation, pneumonia, sepsis.  Patient reports increased work of breathing over the past 2 to 3 days.  Positive cough wheezing and sputum production.  Minimal malaise.  Mild orthopnea.  No reported fevers or chills.  Previously smoking 1 pack/day.  Patient states she no longer smokes.  No abdominal pain or diarrhea.  Symptoms have progressively worsened over this timeframe.  Baseline atrial fibrillation and CAD followed by [facility] outpatient.  Has been compliant with medication regimen per report. Presented to the ER afebrile, heart rate 100s, respirations in the mid 30s, BP stable, transition to BiPAP.  White count 18.5, hemoglobin 12.5, platelets 444, D-dimer 1.63, lactate 4.6-->3.2, troponin 30s.  COVID flu and RSV negative.  VBG with a decompensated respiratory acidosis.  Creatinine 0.87.  Glucose 349.  AST 97, ALT 86. Review of Systems: As mentioned in the history of present illness. All other systems reviewed and are negative. Past Medical History:  Diagnosis Date   Allergies    Anxiety    Aortic atherosclerosis (HCC)    Bipolar affective disorder (HCC)    COPD (chronic obstructive pulmonary disease) (HCC)    Coronary artery calcification seen on CT scan    Depression    Diastolic dysfunction 02/24/2020   a.) TTE 02/24/2020: EF >55%; triv PR, mild TR, mod MR; G1DD.   DOE (dyspnea on exertion)    Emphysema lung (HCC)    History of cataract    HLD  (hyperlipidemia)    Hypertension    Macular degeneration    Migraines    Osteoporosis    Pneumonia    Tobacco use    Past Surgical History:  Procedure Laterality Date   CATARACT EXTRACTION     COLONOSCOPY N/A 08/30/2021   Procedure: COLONOSCOPY;  Surgeon: Jaynie Collins, DO;  Location: [facility] ENDOSCOPY;  Service: Gastroenterology;  Laterality: N/A;   DILATION AND CURETTAGE OF UTERUS     TEE WITHOUT CARDIOVERSION N/A 04/20/2023   Procedure: TRANSESOPHAGEAL ECHOCARDIOGRAM;  Surgeon: Antonieta Iba, MD;  Location: [facility] ORS;  Service: Cardiovascular;  Laterality: N/A;   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 Her smoking use included cigarettes. She started smoking about 51 years ago. She has a 12.5 pack-year smoking history. She has never used smokeless tobacco. She reports that she does not drink alcohol and does not use drugs.  Allergies  Allergen Reactions   Bupropion Other (See Comments)    Foggy brain    Family History  Problem Relation Age of Onset   Breast cancer Mother 10   Heart Problems Maternal Uncle    Heart Problems Paternal Aunt    Heart Problems Paternal Uncle     Prior to Admission medications   Medication Sig Start Date End  Date Taking? Authorizing Provider  albuterol (VENTOLIN HFA) 108 (90 Base) MCG/ACT inhaler Inhale 2 puffs into the lungs every 6 (six) hours as needed for wheezing or shortness of breath. 06/22/23   Claybon Jabs, MD  apixaban (ELIQUIS) 5 MG TABS tablet Take 1 tablet (5 mg total) by mouth 2 (two) times daily. 12/13/22   Iran Ouch, MD  Calcium Carbonate (CALCIUM 500 PO) Take 500 mg by mouth daily at 12 noon.    [provider]  Cholecalciferol (VITAMIN D3) 1.25 MG (50000  UT) CAPS Take 5,000 Units by mouth daily in the afternoon.    [provider]  doxycycline (VIBRA-TABS) 100 MG tablet Take 1 tablet (100 mg total) by mouth 2 (two) times daily for 10 days. 06/22/23 07/02/23  Claybon Jabs, MD  ibuprofen (ADVIL) 200 MG tablet Take 400 mg by mouth every 6 (six) hours as needed for headache.    [provider]  Ipratropium-Albuterol (COMBIVENT RESPIMAT) 20-100 MCG/ACT AERS respimat Inhale 1 puff into the lungs 2 (two) times daily.    [provider]  ipratropium-albuterol (DUONEB) 0.5-2.5 (3) MG/3ML SOLN Take 3 mLs by nebulization 2 (two) times daily. Mix with budesonide    [provider]  losartan (COZAAR) 100 MG tablet Take 100 mg by mouth daily.    [provider]  metoprolol succinate (TOPROL-XL) 50 MG 24 hr tablet Take 1 tablet (50 mg total) by mouth daily. Take with or immediately following a meal. 01/06/23 05/16/23  Iran Ouch, MD  Multiple Vitamins-Minerals (PRESERVISION AREDS 2+MULTI VIT PO) Take 2 tablets by mouth daily.    [provider]  omeprazole (PRILOSEC) 40 MG capsule Take 40 mg by mouth daily as needed (Heartburn). 03/31/22 05/16/23  [provider]  pravastatin (PRAVACHOL) 10 MG tablet Take 10 mg by mouth daily.    [provider]  predniSONE (DELTASONE) 20 MG tablet Take 2 tablets (40 mg total) by mouth daily with breakfast for 4 days. 06/23/23 06/27/23  Claybon Jabs, MD  sertraline (ZOLOFT) 100 MG tablet Take 100 mg by mouth daily. Patient not taking: Reported on 05/16/2023 06/29/22 06/29/23  [provider]  sulfamethoxazole-trimethoprim (BACTRIM) 400-80 MG tablet  03/06/23   [provider]    Physical Exam: Vitals:   06/24/23 0645 06/24/23 0750 06/24/23 0800 06/24/23 0830  BP: (!) 154/73  (!) 160/78 (!) 151/82  Pulse: 83  92 94  Resp: (!) 32  (!) 32 (!) 31  Temp:  (!) 96.7 F (35.9 C)    TempSrc:  Axillary    SpO2: 100%  100% 99%   Physical  Exam Constitutional:      Comments: Underweight  BIPAP in place    HENT:     Head: Normocephalic.     Nose: Nose normal.     Mouth/Throat:     Mouth: Mucous membranes are moist.  Eyes:     Conjunctiva/sclera: Conjunctivae normal.     Pupils: Pupils are equal, round, and reactive to light.  Cardiovascular:     Rate and Rhythm: Normal rate and regular rhythm.  Pulmonary:     Effort: Pulmonary effort is normal.     Breath sounds: Wheezing present.  Abdominal:     General: Bowel sounds are normal.  Musculoskeletal:        General: Normal range of motion.     Cervical back: Normal range of motion.  Skin:    General: Skin is warm.  Neurological:  General: No focal deficit present.  Psychiatric:        Mood and Affect: Mood normal.     Data Reviewed:  There are no new results to review at this time.  DG Chest Port 1 View CLINICAL DATA:  Shortness of breath  EXAM: PORTABLE CHEST 1 VIEW  COMPARISON:  Two days prior  FINDINGS: Interval diffuse reticular nodular opacity, although asymmetric to the left, with small pleural effusions. Normal heart size and mediastinal contours. Extensive artifact from EKG leads.  IMPRESSION: New reticulonodular opacity asymmetric to the left lung with small pleural effusions, asymmetry favoring pneumonia over edema.  Electronically Signed   By: Tiburcio Pea M.D.   On: 06/24/2023 06:09  Lab Results  Component Value Date   WBC 18.5 (H) 06/24/2023   HGB 12.5 06/24/2023   HCT 39.9 06/24/2023   MCV 108.7 (H) 06/24/2023   PLT 444 (H) 06/24/2023   Last metabolic panel Lab Results  Component Value Date   GLUCOSE 349 (H) 06/24/2023   NA 142 06/24/2023   K 4.0 06/24/2023   CL 103 06/24/2023   CO2 24 06/24/2023   BUN 29 (H) 06/24/2023   CREATININE 0.87 06/24/2023   GFRNONAA >60 06/24/2023   CALCIUM 8.6 (L) 06/24/2023   PROT 6.4 (L) 06/24/2023   ALBUMIN 3.4 (L) 06/24/2023   LABGLOB 2.2 05/16/2023   BILITOT 0.8 06/24/2023    ALKPHOS 68 06/24/2023   AST 97 (H) 06/24/2023   ALT 86 (H) 06/24/2023   ANIONGAP 15 06/24/2023    Assessment and Plan: * Acute respiratory failure with hypoxia (HCC) COPD exacerbation Pneumonia Acute on chronic HFpEF  Decompensated respiratory failure requiring BiPAP on presentation Suspect likely multi factorial with contributions of COPD,?  Pneumonia acute on chronic HFpEF  Positive wheezing cough and sputum production consistent with COPD flare ?  Infiltrate on imaging concerning for infection BNP 1600 (well above baseline) with noted orthopnea at home IV Solu-Medrol DuoNebs IV Rocephin and azithromycin for infectious coverage IV Lasix 2D echo CT imaging of the chest pending to better assess lung pathology Monitor  Sepsis (HCC) Initially meeting sepsis criteria with heart rate 100s, respirations into the 30s now on BiPAP, white count 18 Noted?  Pneumonia on imaging Lactate 4.6-3.2 with diuresis in setting of volume overload Procalcitonin less than 0.10 Empirically started on pneumonia coverage with Rocephin and azithromycin Panculture  Defer LR maintenance IV fluids given volume status at present CT imaging pending to better assess lung pathology Monitor   Hyperglycemia Blood sugar in 300s on presentation SSI A1c Monitor sugars with steroid use  Mitral valve insufficiency Moderate by TEE in 04/2023.  Monitor with periodic echo  Paroxysmal atrial fibrillation with RVR (HCC) Heart rate in the low 100s Continue home metoprolol and Eliquis Monitor  Benign essential hypertension BP stable Titrate home regimen  Tobacco use disorder Patient reports no longer smoking Monitor   Greater than 50% was spent in counseling and coordination of care with patient Critical care: 65 minutes or more    Advance Care Planning:   Code Status: Full Code   Consults: None at present- consider cardiology input   Family Communication: Husband at the bedside   Severity  of Illness: The appropriate patient status for this patient is INPATIENT. Inpatient status is judged to be reasonable and necessary in order to provide the required intensity of service to ensure the patient's safety. The patient's presenting symptoms, physical exam findings, and initial radiographic and laboratory data in the context of their  chronic comorbidities is felt to place them at high risk for further clinical deterioration. Furthermore, it is not anticipated that the patient will be medically stable for discharge from the hospital within 2 midnights of admission.   * I certify that at the point of admission it is my clinical judgment that the patient will require inpatient hospital care spanning beyond 2 midnights from the point of admission due to high intensity of service, high risk for further deterioration and high frequency of surveillance required.*  Author: Floydene Flock, MD 06/24/2023 9:34 AM  For on call review [email or web address].

## 2023-06-24 NOTE — Sepsis Progress Note (Signed)
Elink monitoring for the code sepsis protocol.  

## 2023-06-24 NOTE — Assessment & Plan Note (Signed)
Blood sugar in 300s on presentation SSI A1c Monitor sugars with steroid use

## 2023-06-24 NOTE — Progress Notes (Signed)
CODE SEPSIS - PHARMACY COMMUNICATION  **Broad Spectrum Antibiotics should be administered within 1 hour of Sepsis diagnosis**  Time Code Sepsis Called/Page Received: 1/18 @ 0620  Antibiotics Ordered: Cefepime, Vancomycin  Time of 1st antibiotic administration: Cefepime 2 gm IV X 1 on 1/18 @ 0624  Additional action taken by pharmacy:   If necessary, Name of Provider/Nurse Contacted:     Alysandra Lobue D ,PharmD Clinical Pharmacist  06/24/2023  6:35 AM

## 2023-06-24 NOTE — ED Notes (Signed)
Christina, RN and this RN checked on pt as O2 sats on monitor were 66%, Upon entrance to room pt was not wearing her Oxygen. Pt was moving down bed, pt stood at bedside until RNs changed linen, pt voided on bed. Pt's HR increased to 180's, pt placed back on oxygen her oxygen increased to 96% on 2L/[place]. RN administered her breathing treatment. Pt's heart rate in the 150-160's

## 2023-06-24 NOTE — Sepsis Progress Note (Signed)
Fluid requirements not met per sepsis protocol for lactic acid >4 due to concern for CHF.

## 2023-06-24 NOTE — Assessment & Plan Note (Signed)
Heart rate in the low 100s Continue home metoprolol and Eliquis Monitor

## 2023-06-24 NOTE — ED Provider Notes (Signed)
The patient is noted to have a lactate>4. With the current information available to me, I don't think the patient is in septic shock. The lactate>4, is related to intense albuterol use and respiratory distress/ respiratory failure causing acidosis which I think is causing severe metabolic demant and less likely lactic acid due to demand from infection.    Sharyn Creamer, MD 06/24/23 [phone]

## 2023-06-24 NOTE — ED Triage Notes (Signed)
Pt to ED from home with resp distress since 3 days. O2 sats in 60s on RA fro EMS, pt on CPAP up to 90's after 5 minutes.   2 g Mg 125 mg Solumedrol DuoNeb x2 RR 40 HR 125 18 L AC  Hx COPD

## 2023-06-24 NOTE — Assessment & Plan Note (Addendum)
COPD exacerbation Pneumonia Acute on chronic HFpEF  Decompensated respiratory failure requiring BiPAP on presentation Suspect likely multi factorial with contributions of COPD,?  Pneumonia acute on chronic HFpEF  Positive wheezing cough and sputum production consistent with COPD flare ?  Infiltrate on imaging concerning for infection BNP 1600 (well above baseline) with noted orthopnea at home IV Solu-Medrol DuoNebs IV Rocephin and azithromycin for infectious coverage IV Lasix 2D echo CT imaging of the chest pending to better assess lung pathology Monitor

## 2023-06-25 ENCOUNTER — Other Ambulatory Visit: Payer: Self-pay

## 2023-06-25 ENCOUNTER — Encounter: Payer: Self-pay | Admitting: Family Medicine

## 2023-06-25 DIAGNOSIS — R7989 Other specified abnormal findings of blood chemistry: Secondary | ICD-10-CM | POA: Diagnosis not present

## 2023-06-25 DIAGNOSIS — J449 Chronic obstructive pulmonary disease, unspecified: Secondary | ICD-10-CM | POA: Insufficient documentation

## 2023-06-25 DIAGNOSIS — I4892 Unspecified atrial flutter: Secondary | ICD-10-CM

## 2023-06-25 DIAGNOSIS — I5033 Acute on chronic diastolic (congestive) heart failure: Secondary | ICD-10-CM | POA: Diagnosis not present

## 2023-06-25 DIAGNOSIS — J9602 Acute respiratory failure with hypercapnia: Secondary | ICD-10-CM

## 2023-06-25 DIAGNOSIS — I480 Paroxysmal atrial fibrillation: Secondary | ICD-10-CM

## 2023-06-25 DIAGNOSIS — J9601 Acute respiratory failure with hypoxia: Secondary | ICD-10-CM | POA: Diagnosis not present

## 2023-06-25 DIAGNOSIS — I2510 Atherosclerotic heart disease of native coronary artery without angina pectoris: Secondary | ICD-10-CM

## 2023-06-25 DIAGNOSIS — I10 Essential (primary) hypertension: Secondary | ICD-10-CM

## 2023-06-25 DIAGNOSIS — I2583 Coronary atherosclerosis due to lipid rich plaque: Secondary | ICD-10-CM

## 2023-06-25 DIAGNOSIS — I340 Nonrheumatic mitral (valve) insufficiency: Secondary | ICD-10-CM

## 2023-06-25 LAB — COMPREHENSIVE METABOLIC PANEL
ALT: 61 U/L — ABNORMAL HIGH (ref 0–44)
AST: 42 U/L — ABNORMAL HIGH (ref 15–41)
Albumin: 3.4 g/dL — ABNORMAL LOW (ref 3.5–5.0)
Alkaline Phosphatase: 63 U/L (ref 38–126)
Anion gap: 14 (ref 5–15)
BUN: 22 mg/dL (ref 8–23)
CO2: 26 mmol/L (ref 22–32)
Calcium: 8 mg/dL — ABNORMAL LOW (ref 8.9–10.3)
Chloride: 98 mmol/L (ref 98–111)
Creatinine, Ser: 0.67 mg/dL (ref 0.44–1.00)
GFR, Estimated: >60 mL/min (ref >60)
Glucose, Bld: 106 mg/dL — ABNORMAL HIGH (ref 70–99)
Potassium: 2.9 mmol/L — ABNORMAL LOW (ref 3.5–5.1)
Sodium: 138 mmol/L (ref 135–145)
Total Bilirubin: 1 mg/dL (ref 0.0–1.2)
Total Protein: 6 g/dL — ABNORMAL LOW (ref 6.5–8.1)

## 2023-06-25 LAB — RESPIRATORY PANEL BY PCR

## 2023-06-25 LAB — CBC
HCT: 34.2 % — ABNORMAL LOW (ref 36.0–46.0)
Hemoglobin: 11.4 g/dL — ABNORMAL LOW (ref 12.0–15.0)
MCH: 34.2 pg — ABNORMAL HIGH (ref 26.0–34.0)
MCHC: 33.3 g/dL (ref 30.0–36.0)
MCV: 102.7 fL — ABNORMAL HIGH (ref 80.0–100.0)
Platelets: 385 10*3/uL (ref 150–400)
RBC: 3.33 MIL/uL — ABNORMAL LOW (ref 3.87–5.11)
RDW: 13.9 % (ref 11.5–15.5)
WBC: 21 10*3/uL — ABNORMAL HIGH (ref 4.0–10.5)
nRBC: 0.1 % (ref 0.0–0.2)

## 2023-06-25 LAB — MRSA NEXT GEN BY PCR, NASAL: MRSA by PCR Next Gen: NOT DETECTED

## 2023-06-25 LAB — HEMOGLOBIN A1C
Hgb A1c MFr Bld: 5.6 % (ref 4.8–5.6)
Mean Plasma Glucose: 114.02 mg/dL

## 2023-06-25 LAB — POTASSIUM: Potassium: 4 mmol/L (ref 3.5–5.1)

## 2023-06-25 LAB — CBG MONITORING, ED
Glucose-Capillary: 110 mg/dL — ABNORMAL HIGH (ref 70–99)
Glucose-Capillary: 125 mg/dL — ABNORMAL HIGH (ref 70–99)

## 2023-06-25 LAB — GLUCOSE, CAPILLARY
Glucose-Capillary: 174 mg/dL — ABNORMAL HIGH (ref 70–99)
Glucose-Capillary: 200 mg/dL — ABNORMAL HIGH (ref 70–99)
Glucose-Capillary: 96 mg/dL (ref 70–99)
Glucose-Capillary: 140 mg/dL — ABNORMAL HIGH (ref 70–99)

## 2023-06-25 LAB — TSH: TSH: 7.817 u[IU]/mL — ABNORMAL HIGH (ref 0.350–4.500)

## 2023-06-25 LAB — MAGNESIUM: Magnesium: 2.2 mg/dL (ref 1.7–2.4)

## 2023-06-25 MED ORDER — IPRATROPIUM BROMIDE 0.02 % IN SOLN
0.5000 mg | Freq: Four times a day (QID) | RESPIRATORY_TRACT | Status: DC
  Administered 2023-06-25: 0.5 mg via RESPIRATORY_TRACT
  Filled 2023-06-25: qty 2.5

## 2023-06-25 MED ORDER — POTASSIUM CHLORIDE 10 MEQ/100ML IV SOLN
10.0000 meq | INTRAVENOUS | Status: AC
  Administered 2023-06-25 (×4): 10 meq via INTRAVENOUS
  Filled 2023-06-25 (×4): qty 100

## 2023-06-25 MED ORDER — CHLORHEXIDINE GLUCONATE CLOTH 2 % EX PADS
6.0000 | MEDICATED_PAD | Freq: Every day | CUTANEOUS | Status: DC
  Administered 2023-06-25 – 2023-06-28 (×4): 6 via TOPICAL

## 2023-06-25 MED ORDER — LEVALBUTEROL HCL 0.63 MG/3ML IN NEBU
0.6300 mg | INHALATION_SOLUTION | RESPIRATORY_TRACT | Status: DC | PRN
  Administered 2023-06-26: 0.63 mg via RESPIRATORY_TRACT
  Filled 2023-06-25: qty 3

## 2023-06-25 MED ORDER — SODIUM CHLORIDE 0.9 % IV SOLN
1.0000 g | INTRAVENOUS | Status: DC
  Administered 2023-06-26: 1 g via INTRAVENOUS
  Filled 2023-06-25: qty 10

## 2023-06-25 MED ORDER — SERTRALINE HCL 50 MG PO TABS
100.0000 mg | ORAL_TABLET | Freq: Every day | ORAL | Status: DC
  Administered 2023-06-25 – 2023-06-28 (×3): 100 mg via ORAL
  Filled 2023-06-25 (×4): qty 2

## 2023-06-25 MED ORDER — ENSURE ENLIVE PO LIQD
237.0000 mL | Freq: Two times a day (BID) | ORAL | Status: DC
  Administered 2023-06-25 – 2023-06-28 (×3): 237 mL via ORAL

## 2023-06-25 MED ORDER — POTASSIUM CHLORIDE CRYS ER 20 MEQ PO TBCR
40.0000 meq | EXTENDED_RELEASE_TABLET | Freq: Once | ORAL | Status: AC
  Administered 2023-06-25: 40 meq via ORAL
  Filled 2023-06-25: qty 2

## 2023-06-25 MED ORDER — ALPRAZOLAM 0.25 MG PO TABS
0.2500 mg | ORAL_TABLET | Freq: Once | ORAL | Status: AC
  Administered 2023-06-25: 0.25 mg via ORAL
  Filled 2023-06-25: qty 1

## 2023-06-25 MED ORDER — AMIODARONE LOAD VIA INFUSION
150.0000 mg | Freq: Once | INTRAVENOUS | Status: AC
  Administered 2023-06-25: 150 mg via INTRAVENOUS
  Filled 2023-06-25: qty 83.34

## 2023-06-25 MED ORDER — FUROSEMIDE 10 MG/ML IJ SOLN
40.0000 mg | Freq: Once | INTRAMUSCULAR | Status: AC
  Administered 2023-06-25: 40 mg via INTRAVENOUS
  Filled 2023-06-25: qty 4

## 2023-06-25 MED ORDER — AMIODARONE HCL IN DEXTROSE 360-4.14 MG/200ML-% IV SOLN
60.0000 mg/h | INTRAVENOUS | Status: DC
  Administered 2023-06-25 (×2): 60 mg/h via INTRAVENOUS
  Filled 2023-06-25 (×2): qty 200

## 2023-06-25 MED ORDER — METOPROLOL TARTRATE 5 MG/5ML IV SOLN
5.0000 mg | Freq: Once | INTRAVENOUS | Status: AC
  Administered 2023-06-25: 5 mg via INTRAVENOUS
  Filled 2023-06-25: qty 5

## 2023-06-25 MED ORDER — IPRATROPIUM-ALBUTEROL 0.5-2.5 (3) MG/3ML IN SOLN: 3.0000 mL | Freq: Four times a day (QID) | RESPIRATORY_TRACT | Status: DC

## 2023-06-25 MED ORDER — IPRATROPIUM BROMIDE 0.02 % IN SOLN
0.5000 mg | Freq: Two times a day (BID) | RESPIRATORY_TRACT | Status: DC
  Administered 2023-06-26: 0.5 mg via RESPIRATORY_TRACT
  Filled 2023-06-25: qty 2.5

## 2023-06-25 MED ORDER — SODIUM CHLORIDE 0.9 % IV SOLN
500.0000 mg | INTRAVENOUS | Status: AC
  Administered 2023-06-26 – 2023-06-27 (×2): 500 mg via INTRAVENOUS
  Filled 2023-06-25 (×2): qty 5

## 2023-06-25 MED ORDER — METOPROLOL TARTRATE 25 MG PO TABS
12.5000 mg | ORAL_TABLET | Freq: Four times a day (QID) | ORAL | Status: DC
  Administered 2023-06-25 – 2023-06-27 (×6): 12.5 mg via ORAL
  Filled 2023-06-25 (×6): qty 1

## 2023-06-25 MED ORDER — AMIODARONE HCL IN DEXTROSE 360-4.14 MG/200ML-% IV SOLN
30.0000 mg/h | INTRAVENOUS | Status: DC
  Administered 2023-06-25 – 2023-06-26 (×3): 30 mg/h via INTRAVENOUS
  Filled 2023-06-25 (×3): qty 200

## 2023-06-25 NOTE — Progress Notes (Signed)
PROGRESS NOTE    Taylor Chambers  RUE:[id number] DOB: 1945/11/19 DOA: 06/24/2023 PCP: Mick Sell, MD  Outpatient Specialists: cardiology, pulmonology    Brief Narrative:   From admission h and p Taylor Chambers is a 78 y.o. female with medical history significant of COPD, CAD, atrial fibrillation, HFpEF, mitral insufficiency, hyperlipidemia, hypertension, tobacco abuse presenting with acute respiratory failure with hypoxia, acute on chronic HFpEF, COPD exacerbation, pneumonia, sepsis.  Patient reports increased work of breathing over the past 2 to 3 days.  Positive cough wheezing and sputum production.  Minimal malaise.  Mild orthopnea.  No reported fevers or chills.  Previously smoking 1 pack/day.  Patient states she no longer smokes.  No abdominal pain or diarrhea.  Symptoms have progressively worsened over this timeframe.  Baseline atrial fibrillation and CAD followed by [facility] outpatient.  Has been compliant with medication regimen per report.    Assessment & Plan:   Principal Problem:   Acute respiratory failure with hypoxia (HCC) Active Problems:   Sepsis (HCC)   Tobacco use disorder   Benign essential hypertension   Malnutrition of moderate degree   Paroxysmal atrial fibrillation with RVR (HCC)   Mitral valve insufficiency   Hyperglycemia   COPD (chronic obstructive pulmonary disease) (HCC)  # A-fib with RVR Reports several weeks fatigue, exertional dyspnea, most likely mediated by rvr. Hasn't responded to oral and iv BB - cardiology consulted - cont apixaban - f/u TSH  # HFpEF with acute exacerbation # Moderate mitral regurg Recent TEE with preserved EF, moderate MR. Likely mediated by a-fib. No chest pain and mild stable trop elevation, doubt acs - lasix 40 IV once - cardiology to see  # Pleural effusions Appear to be new, suspect 2/2 a-fib - consider thoracentesis if develops overt infectious symptoms or fails to improve with treating a-fib and chf  # COPD With  acute exacerbation, though suspect a-fib/chf to be playing a greater role. Covid/flu/rsv neg. No fever and normal procal so think less likely underlying bacterial pneumonia, though CTA (no PE) does show dependent lung opacities - f/u rvp - sputum for culture (not producing any currently.  - continue steroids - will continue ceftriaxone and azithromycin for now - scheduled duonebs  # Hypokalemia 2.9 - replete - f/u Mg  # Acute hypercarbic respiratory failure on chronic hypoxic respiratory failure Altered and hypercarbic on arrival requiring bipap, now weaned off and stable on home 2 liters - continue [place] O2  # HTN Soft BPs in setting of above - hold home meds  # GAD - home sertraline      DVT prophylaxis: therapeutic apixaban Code Status: full Family Communication: husband and son updated @ bedside 1/19  Level of care: Stepdown Status is: Inpatient Remains inpatient appropriate because: severity of illness    Consultants:  cardiology  Procedures: none    Subjective: Reports mild dyspnea at rest, no pain  Objective: Vitals:   06/25/23 0700 06/25/23 0730 06/25/23 0737 06/25/23 0832  BP: 136/89 (!) 136/90  114/73  Pulse: (!) 146 (!) 142  (!) 144  Resp: (!) 27 (!) 23  (!) 23  Temp:   98.3 F (36.8 C) 98.4 F (36.9 C)  TempSrc:   Oral Oral  SpO2: 98% 95%  93%  Weight:    50.4 kg   No intake or output data in the 24 hours ending 06/25/23 0927 Filed Weights   06/25/23 0832  Weight: 50.4 kg    Examination:  General exam: Appears calm and comfortable  Respiratory system: decreased at bases, scattered rhonchi Cardiovascular system: tachycardic, systolic murmur Gastrointestinal system: Abdomen is nondistended, soft and nontender. No organomegaly or masses felt. Normal bowel sounds heard. Central nervous system: Alert and oriented. No focal neurological deficits. Extremities: Symmetric 5 x 5 power. No edema Skin: No rashes, lesions or ulcers Psychiatry:  Judgement and insight appear normal. Mood & affect appropriate.     Data Reviewed: I have personally reviewed following labs and imaging studies  CBC: Recent Labs  Lab 06/22/23 0944 06/24/23 0534 06/25/23 0436  WBC 10.5 18.5* 21.0*  NEUTROABS  --  14.0*  --   HGB 11.7* 12.5 11.4*  HCT 36.0 39.9 34.2*  MCV 105.9* 108.7* 102.7*  PLT 339 444* 385   Basic Metabolic Panel: Recent Labs  Lab 06/22/23 0944 06/24/23 0534 06/25/23 0435 06/25/23 0436  NA 145 142  --  138  K 4.0 4.0  --  2.9*  CL 107 103  --  98  CO2 25 24  --  26  GLUCOSE 125* 349*  --  106*  BUN 23 29*  --  22  CREATININE 0.89 0.87  --  0.67  CALCIUM 9.2 8.6*  --  8.0*  MG  --   --  2.2  --    GFR: Estimated Creatinine Clearance: 47.3 mL/min (by C-G formula based on SCr of 0.67 mg/dL). Liver Function Tests: Recent Labs  Lab 06/22/23 0944 06/24/23 0534 06/25/23 0436  AST 72* 97* 42*  ALT 57* 86* 61*  ALKPHOS 63 68 63  BILITOT 0.9 0.8 1.0  PROT 6.3* 6.4* 6.0*  ALBUMIN 3.4* 3.4* 3.4*   No results for input(s): "LIPASE", "AMYLASE" in the last 168 hours. No results for input(s): "AMMONIA" in the last 168 hours. Coagulation Profile: Recent Labs  Lab 06/24/23 0534  INR 1.4*   Cardiac Enzymes: No results for input(s): "CKTOTAL", "CKMB", "CKMBINDEX", "TROPONINI" in the last 168 hours. BNP (last 3 results) No results for input(s): "PROBNP" in the last 8760 hours. HbA1C: Recent Labs    06/25/23 0436  HGBA1C 5.6   CBG: Recent Labs  Lab 06/24/23 1139 06/24/23 1615 06/25/23 0208 06/25/23 0708 06/25/23 0833  GLUCAP 115* 118* 110* 125* 174*   Lipid Profile: No results for input(s): "CHOL", "HDL", "LDLCALC", "TRIG", "CHOLHDL", "LDLDIRECT" in the last 72 hours. Thyroid Function Tests: No results for input(s): "TSH", "T4TOTAL", "FREET4", "T3FREE", "THYROIDAB" in the last 72 hours. Anemia Panel: No results for input(s): "VITAMINB12", "FOLATE", "FERRITIN", "TIBC", "IRON", "RETICCTPCT" in the last  72 hours. Urine analysis:    Component Value Date/Time   COLORURINE YELLOW (A) 10/28/2021 1425   APPEARANCEUR HAZY (A) 10/28/2021 1425   LABSPEC 1.026 10/28/2021 1425   PHURINE 5.0 10/28/2021 1425   GLUCOSEU NEGATIVE 10/28/2021 1425   HGBUR NEGATIVE 10/28/2021 1425   BILIRUBINUR NEGATIVE 10/28/2021 1425   KETONESUR 5 (A) 10/28/2021 1425   PROTEINUR 30 (A) 10/28/2021 1425   NITRITE NEGATIVE 10/28/2021 1425   LEUKOCYTESUR NEGATIVE 10/28/2021 1425   Sepsis Labs: @LABRCNTIP (procalcitonin:4,lacticidven:4)  ) Recent Results (from the past 240 hours)  Blood culture (routine x 2)     Status: None (Preliminary result)   Collection Time: 06/24/23  5:34 AM   Specimen: BLOOD  Result Value Ref Range Status   Specimen Description BLOOD BLOOD RIGHT ARM  Final   Special Requests   Final    BOTTLES DRAWN AEROBIC AND ANAEROBIC Blood Culture results may not be optimal due to an inadequate volume of blood received in culture bottles  Culture   Final    NO GROWTH 1 DAY Performed at [facility], [address] [place], [place] [address]    Report Status PENDING  Incomplete  Blood culture (routine x 2)     Status: None (Preliminary result)   Collection Time: 06/24/23  5:43 AM   Specimen: BLOOD  Result Value Ref Range Status   Specimen Description BLOOD BLOOD RIGHT FOREARM  Final   Special Requests   Final    BOTTLES DRAWN AEROBIC ONLY Blood Culture results may not be optimal due to an inadequate volume of blood received in culture bottles   Culture   Final    NO GROWTH 1 DAY Performed at [facility], [address], [place], [place] [address]    Report Status PENDING  Incomplete  Resp panel by RT-PCR (RSV, Flu A&B, Covid) Anterior Nasal Swab     Status: None   Collection Time: 06/24/23  5:43 AM   Specimen: Anterior Nasal Swab  Result Value Ref Range Status   SARS Coronavirus 2 by RT PCR NEGATIVE NEGATIVE Final    Comment: (NOTE) SARS-CoV-2 target nucleic acids are  NOT DETECTED.  The SARS-CoV-2 RNA is generally detectable in upper respiratory specimens during the acute phase of infection. The lowest concentration of SARS-CoV-2 viral copies this assay can detect is 138 copies/mL. A negative result does not preclude SARS-Cov-2 infection and should not be used as the sole basis for treatment or other patient management decisions. A negative result may occur with  improper specimen collection/handling, submission of specimen other than nasopharyngeal swab, presence of viral mutation(s) within the areas targeted by this assay, and inadequate number of viral copies(<138 copies/mL). A negative result must be combined with clinical observations, patient history, and epidemiological information. The expected result is Negative.  Fact Sheet for Patients:  BloggerCourse.com  Fact Sheet for Healthcare Providers:  SeriousBroker.it  This test is no t yet approved or cleared by the [place] FDA and  has been authorized for detection and/or diagnosis of SARS-CoV-2 by FDA under an Emergency Use Authorization (EUA). This EUA will remain  in effect (meaning this test can be used) for the duration of the COVID-19 declaration under Section 564(b)(1) of the Act, 21 U.S.C.section 360bbb-3(b)(1), unless the authorization is terminated  or revoked sooner.       Influenza A by PCR NEGATIVE NEGATIVE Final   Influenza B by PCR NEGATIVE NEGATIVE Final    Comment: (NOTE) The Xpert Xpress SARS-CoV-2/FLU/RSV plus assay is intended as an aid in the diagnosis of influenza from Nasopharyngeal swab specimens and should not be used as a sole basis for treatment. Nasal washings and aspirates are unacceptable for Xpert Xpress SARS-CoV-2/FLU/RSV testing.  Fact Sheet for Patients: BloggerCourse.com  Fact Sheet for Healthcare Providers: SeriousBroker.it  This test is not  yet approved or cleared by the [place] FDA and has been authorized for detection and/or diagnosis of SARS-CoV-2 by FDA under an Emergency Use Authorization (EUA). This EUA will remain in effect (meaning this test can be used) for the duration of the COVID-19 declaration under Section 564(b)(1) of the Act, 21 U.S.C. section 360bbb-3(b)(1), unless the authorization is terminated or revoked.     Resp Syncytial Virus by PCR NEGATIVE NEGATIVE Final    Comment: (NOTE) Fact Sheet for Patients: BloggerCourse.com  Fact Sheet for Healthcare Providers: SeriousBroker.it  This test is not yet approved or cleared by the [place] FDA and has been authorized for detection and/or diagnosis of SARS-CoV-2  by FDA under an Emergency Use Authorization (EUA). This EUA will remain in effect (meaning this test can be used) for the duration of the COVID-19 declaration under Section 564(b)(1) of the Act, 21 U.S.C. section 360bbb-3(b)(1), unless the authorization is terminated or revoked.  Performed at [facility], [address], [place], [place] [address]          Radiology Studies: CT Angio Chest Pulmonary Embolism (PE) W or WO Contrast Result Date: 06/24/2023 CLINICAL DATA:  Positive D-dimer.  Respiratory distress. EXAM: CT ANGIOGRAPHY CHEST WITH CONTRAST TECHNIQUE: Multidetector CT imaging of the chest was performed using the standard protocol during bolus administration of intravenous contrast. Multiplanar CT image reconstructions and MIPs were obtained to evaluate the vascular anatomy. RADIATION DOSE REDUCTION: This exam was performed according to the departmental dose-optimization program which includes automated exposure control, adjustment of the mA and/or kV according to patient size and/or use of iterative reconstruction technique. CONTRAST:  75mL OMNIPAQUE IOHEXOL 350 MG/ML SOLN COMPARISON:  Chest CT, 01/12/2023, and older  exams. FINDINGS: Cardiovascular: Pulmonary arteries are well opacified. There is no evidence of a pulmonary embolism. Heart normal in size configuration. Circumflex coronary artery calcifications. No pericardial effusion. Great vessels are normal in caliber. No aortic dissection. Aortic atherosclerosis. Arch branch vessels are widely patent. Mediastinum/Nodes: No neck base, mediastinal or hilar masses. No enlarged lymph nodes. Trachea and esophagus are unremarkable. Lungs/Pleura: Moderate emphysema. Mild chronic apical pleuroparenchymal scarring. Dependent opacity in the left upper lobe, minimally in the right upper lobe. Ground-glass type opacity in the medial right lower lobe. Dependent opacity noted in the lower lobes, greater on the left with associated bronchial wall and interstitial thickening. Moderate right and small left pleural effusions. No pneumothorax. Upper Abdomen: No acute findings. Musculoskeletal: No fracture or acute finding. No bone lesion. No chest wall mass. Review of the MIP images confirms the above findings. IMPRESSION: 1. No evidence of a pulmonary embolism. 2. Dependent lung opacities as detailed associated with moderate right and small left pleural effusions. Suspect a combination of multifocal infection and dependent atelectasis. Consider a component pulmonary edema in the proper clinical setting. 3. Moderate emphysema. Aortic Atherosclerosis (ICD10-I70.0) and Emphysema (ICD10-J43.9). Electronically Signed   By: Amie Portland M.D.   On: 06/24/2023 10:17   DG Chest Port 1 View Result Date: 06/24/2023 CLINICAL DATA:  Shortness of breath EXAM: PORTABLE CHEST 1 VIEW COMPARISON:  Two days prior FINDINGS: Interval diffuse reticular nodular opacity, although asymmetric to the left, with small pleural effusions. Normal heart size and mediastinal contours. Extensive artifact from EKG leads. IMPRESSION: New reticulonodular opacity asymmetric to the left lung with small pleural effusions,  asymmetry favoring pneumonia over edema. Electronically Signed   By: Tiburcio Pea M.D.   On: 06/24/2023 06:09        Scheduled Meds:  apixaban  5 mg Oral BID   Chlorhexidine Gluconate Cloth  6 each Topical Daily   insulin aspart  0-9 Units Subcutaneous TID WC   insulin aspart  4 Units Subcutaneous TID WC   ipratropium-albuterol  3 mL Nebulization Q6H   metoprolol succinate  50 mg Oral Daily   predniSONE  40 mg Oral Q breakfast   sertraline  100 mg Oral Daily   Continuous Infusions:  azithromycin Stopped (06/25/23 0557)   cefTRIAXone (ROCEPHIN)  IV Stopped (06/25/23 0551)   potassium chloride 10 mEq (06/25/23 0920)     LOS: 1 day     Silvano Bilis, MD [facility]   If 7PM-7AM,  please contact night-coverage [email or web address] Password [facility] 06/25/2023, 9:27 AM

## 2023-06-25 NOTE — Plan of Care (Signed)
  Problem: Skin Integrity: Goal: Risk for impaired skin integrity will decrease Outcome: Progressing   Problem: Clinical Measurements: Goal: Respiratory complications will improve Outcome: Progressing   Problem: Coping: Goal: Level of anxiety will decrease Outcome: Progressing   Problem: Elimination: Goal: Will not experience complications related to bowel motility Outcome: Progressing Goal: Will not experience complications related to urinary retention Outcome: Progressing   Problem: Education: Goal: Knowledge of disease or condition will improve Outcome: Progressing   Problem: Respiratory: Goal: Ability to maintain a clear airway will improve Outcome: Progressing Goal: Levels of oxygenation will improve Outcome: Progressing Goal: Ability to maintain adequate ventilation will improve Outcome: Progressing

## 2023-06-25 NOTE — ED Notes (Signed)
Patient states she is not a diabetic. She does not take insulin at home.

## 2023-06-25 NOTE — Progress Notes (Signed)
Made Dr. Mayford Knife aware patient converted to NSR with PAC.

## 2023-06-25 NOTE — ED Notes (Signed)
ED TO INPATIENT HANDOFF REPORT  ED Nurse Name and Phone #: Kathryne Hitch  [phone]   S Name/Age/Gender Taylor Chambers 78 y.o. female Room/Bed: ED18A/ED18A  Code Status   Code Status: Full Code  Home/SNF/Other Home Patient oriented to: self, place, and situation Is this baseline? No   Triage Complete: Triage complete  Chief Complaint Acute respiratory failure with hypoxia (HCC) [J96.01]  Triage Note Pt to ED from home with resp distress since 3 days. O2 sats in 60s on RA fro EMS, pt on CPAP up to 90's after 5 minutes.   2 g Mg 125 mg Solumedrol DuoNeb x2 RR 40 HR 125 18 L AC  Hx COPD   Allergies Allergies  Allergen Reactions   Bupropion Other (See Comments)    Foggy brain    Level of Care/Admitting Diagnosis ED Disposition     ED Disposition  Admit   Condition  --   Comment  Hospital Area: [facility] [[id number]]  Level of Care: Stepdown [14]  Covid Evaluation: Confirmed COVID Negative  Diagnosis: Acute respiratory failure with hypoxia [facility]) [[id number]]  Admitting Physician: Floydene Flock [[id number]]  Attending Physician: Floydene Flock [phone]  Certification:: I certify this patient will need inpatient services for at least 2 midnights  Expected Medical Readiness: 06/26/2023          B Medical/Surgery History Past Medical History:  Diagnosis Date   Allergies    Anxiety    Aortic atherosclerosis (HCC)    Bipolar affective disorder (HCC)    COPD (chronic obstructive pulmonary disease) (HCC)    Coronary artery calcification seen on CT scan    Depression    Diastolic dysfunction 02/24/2020   a.) TTE 02/24/2020: EF >55%; triv PR, mild TR, mod MR; G1DD.   DOE (dyspnea on exertion)    Emphysema lung (HCC)    History of cataract    HLD (hyperlipidemia)    Hypertension    Macular degeneration    Migraines    Osteoporosis    Pneumonia    Tobacco use    Past Surgical History:  Procedure Laterality Date   CATARACT EXTRACTION      COLONOSCOPY N/A 08/30/2021   Procedure: COLONOSCOPY;  Surgeon: Jaynie Collins, DO;  Location: [facility] ENDOSCOPY;  Service: Gastroenterology;  Laterality: N/A;   DILATION AND CURETTAGE OF UTERUS     TEE WITHOUT CARDIOVERSION N/A 04/20/2023   Procedure: TRANSESOPHAGEAL ECHOCARDIOGRAM;  Surgeon: Antonieta Iba, MD;  Location: [facility] ORS;  Service: Cardiovascular;  Laterality: N/A;   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   Procedure: TOTAL HIP ARTHROPLASTY;  Surgeon: Donato Heinz, MD;  Location: [facility] ORS;  Service: Orthopedics;  Laterality: Right;   WISDOM TOOTH EXTRACTION     WRIST GANGLION EXCISION       A IV Location/Drains/Wounds Patient Lines/Drains/Airways Status     Active Line/Drains/Airways     Name Placement date Placement time Site Days   Peripheral IV 06/24/23 18 G Left Antecubital 06/24/23  0523  Antecubital  1   Peripheral IV 06/24/23 18 G Anterior;Proximal;Right Forearm 06/24/23  0533  Forearm  1            Intake/Output Last 24 hours  Intake/Output Summary (Last 24 hours) at 06/25/2023 0737 Last data filed at 06/24/2023 0743 Gross per 24 hour  Intake 200 ml  Output --  Net 200 ml    Labs/Imaging Results for orders placed or performed during the hospital encounter of 06/24/23 (from the past 48 hours)  Blood culture (routine x 2)     Status: None (Preliminary result)   Collection Time: 06/24/23  5:34 AM   Specimen: BLOOD  Result Value Ref Range   Specimen Description BLOOD BLOOD RIGHT ARM    Special Requests      BOTTLES DRAWN AEROBIC AND ANAEROBIC Blood Culture results may not be optimal due to an inadequate volume of blood received in culture bottles   Culture      NO GROWTH 1 DAY Performed at [facility], [address], [place], [place] [address]    Report Status PENDING   Lactic acid, plasma     Status: Abnormal   Collection Time: 06/24/23  5:34 AM  Result Value Ref Range   Lactic Acid, Venous 4.6 (HH) 0.5 - 1.9 mmol/L    Comment:  CRITICAL RESULT CALLED TO, READ BACK BY AND VERIFIED WITH Vickki Hearing @ 6045 06/24/2023 LFD Performed at [facility], [address] [place], [place] [address]   Blood gas, venous     Status: Abnormal   Collection Time: 06/24/23  5:34 AM  Result Value Ref Range   FIO2 60 %   Delivery systems BILEVEL POSITIVE AIRWAY PRESSURE    pH, Ven 7.18 (LL) 7.25 - 7.43    Comment: CRITICAL RESULT CALLED TO, READ BACK BY AND VERIFIED WITH:  MARK QUALE, MD AT 0540 ON [id number] BE    pCO2, Ven 70 (H) 44 - 60 mmHg    Comment: CRITICAL RESULT CALLED TO, READ BACK BY AND VERIFIED WITH: MARK QUALE, MD AT 0540 ON [id number] BE    pO2, Ven 38 32 - 45 mmHg   Bicarbonate 26.1 20.0 - 28.0 mmol/L   Acid-base deficit 3.8 (H) 0.0 - 2.0 mmol/L   O2 Saturation 55.4 %   Patient temperature 37.0    Collection site VEIN     Comment: Performed at [facility], [address] [place], [place] [address]  Comprehensive metabolic panel     Status: Abnormal   Collection Time: 06/24/23  5:34 AM  Result Value Ref Range   Sodium 142 135 - 145 mmol/L   Potassium 4.0 3.5 - 5.1 mmol/L   Chloride 103 98 - 111 mmol/L   CO2 24 22 - 32 mmol/L   Glucose, Bld 349 (H) 70 - 99 mg/dL    Comment: Glucose reference range applies only to samples taken after fasting for at least 8 hours.   BUN 29 (H) 8 - 23 mg/dL   Creatinine, Ser 9.62 0.44 - 1.00 mg/dL   Calcium 8.6 (L) 8.9 - 10.3 mg/dL   Total Protein 6.4 (L) 6.5 - 8.1 g/dL   Albumin 3.4 (L) 3.5 - 5.0 g/dL   AST 97 (H) 15 - 41 U/L   ALT 86 (H) 0 - 44 U/L   Alkaline Phosphatase 68 38 - 126 U/L   Total Bilirubin 0.8 0.0 - 1.2 mg/dL   GFR, Estimated >95 >28 mL/min    Comment: (NOTE) Calculated using the CKD-EPI Creatinine Equation (2021)    Anion gap 15 5 - 15    Comment: Performed at [facility], [address] [place], [place] [address]  CBC with Differential     Status: Abnormal   Collection Time: 06/24/23  5:34 AM  Result Value Ref Range    WBC 18.5 (H) 4.0 - 10.5 K/uL   RBC 3.67 (L) 3.87 - 5.11 MIL/uL   Hemoglobin 12.5 12.0 - 15.0 g/dL   HCT 40.1 02.7 -  46.0 %   MCV 108.7 (H) 80.0 - 100.0 fL   MCH 34.1 (H) 26.0 - 34.0 pg   MCHC 31.3 30.0 - 36.0 g/dL   RDW 16.1 09.6 - 04.5 %   Platelets 444 (H) 150 - 400 K/uL   nRBC 0.2 0.0 - 0.2 %   Neutrophils Relative % 76 %   Neutro Abs 14.0 (H) 1.7 - 7.7 K/uL   Lymphocytes Relative 14 %   Lymphs Abs 2.5 0.7 - 4.0 K/uL   Monocytes Relative 9 %   Monocytes Absolute 1.6 (H) 0.1 - 1.0 K/uL   Eosinophils Relative 0 %   Eosinophils Absolute 0.0 0.0 - 0.5 K/uL   Basophils Relative 0 %   Basophils Absolute 0.1 0.0 - 0.1 K/uL   Immature Granulocytes 1 %   Abs Immature Granulocytes 0.24 (H) 0.00 - 0.07 K/uL    Comment: Performed at [facility], [address], [place], [place] [address]  Troponin I (High Sensitivity)     Status: Abnormal   Collection Time: 06/24/23  5:34 AM  Result Value Ref Range   Troponin I (High Sensitivity) 31 (H) <18 ng/L    Comment: (NOTE) Elevated high sensitivity troponin I (hsTnI) values and significant  changes across serial measurements may suggest ACS but many other  chronic and acute conditions are known to elevate hsTnI results.  Refer to the "Links" section for chest pain algorithms and additional  guidance. Performed at [facility], [address] [place], [place] [address]   Protime-INR     Status: Abnormal   Collection Time: 06/24/23  5:34 AM  Result Value Ref Range   Prothrombin Time 17.4 (H) 11.4 - 15.2 seconds   INR 1.4 (H) 0.8 - 1.2    Comment: (NOTE) INR goal varies based on device and disease states. Performed at [facility], [address] [place], [place] [address]   APTT     Status: None   Collection Time: 06/24/23  5:34 AM  Result Value Ref Range   aPTT 26 24 - 36 seconds    Comment: Performed at [facility], [address] [place], [place] [address]  Brain natriuretic peptide      Status: Abnormal   Collection Time: 06/24/23  5:34 AM  Result Value Ref Range   B Natriuretic Peptide 1,629.4 (H) 0.0 - 100.0 pg/mL    Comment: Performed at [facility], [address] [place], [place] [address]  Procalcitonin     Status: None   Collection Time: 06/24/23  5:34 AM  Result Value Ref Range   Procalcitonin <0.10 ng/mL    Comment:        Interpretation: PCT (Procalcitonin) <= 0.5 ng/mL: Systemic infection (sepsis) is not likely. Local bacterial infection is possible. (NOTE)       Sepsis PCT Algorithm           Lower Respiratory Tract                                      Infection PCT Algorithm    ----------------------------     ----------------------------         PCT < 0.25 ng/mL                PCT < 0.10 ng/mL          Strongly encourage  Strongly discourage   discontinuation of antibiotics    initiation of antibiotics    ----------------------------     -----------------------------       PCT 0.25 - 0.50 ng/mL            PCT 0.10 - 0.25 ng/mL               OR       >80% decrease in PCT            Discourage initiation of                                            antibiotics      Encourage discontinuation           of antibiotics    ----------------------------     -----------------------------         PCT >= 0.50 ng/mL              PCT 0.26 - 0.50 ng/mL               AND        <80% decrease in PCT             Encourage initiation of                                             antibiotics       Encourage continuation           of antibiotics    ----------------------------     -----------------------------        PCT >= 0.50 ng/mL                  PCT > 0.50 ng/mL               AND         increase in PCT                  Strongly encourage                                      initiation of antibiotics    Strongly encourage escalation           of antibiotics                                     -----------------------------                                            PCT <= 0.25 ng/mL                                                 OR                                        >  80% decrease in PCT                                      Discontinue / Do not initiate                                             antibiotics  Performed at [facility], [address] [place], [place] [address]   Blood culture (routine x 2)     Status: None (Preliminary result)   Collection Time: 06/24/23  5:43 AM   Specimen: BLOOD  Result Value Ref Range   Specimen Description BLOOD BLOOD RIGHT FOREARM    Special Requests      BOTTLES DRAWN AEROBIC ONLY Blood Culture results may not be optimal due to an inadequate volume of blood received in culture bottles   Culture      NO GROWTH 1 DAY Performed at [facility], [address], [place], [place] [address]    Report Status PENDING   Resp panel by RT-PCR (RSV, Flu A&B, Covid) Anterior Nasal Swab     Status: None   Collection Time: 06/24/23  5:43 AM   Specimen: Anterior Nasal Swab  Result Value Ref Range   SARS Coronavirus 2 by RT PCR NEGATIVE NEGATIVE    Comment: (NOTE) SARS-CoV-2 target nucleic acids are NOT DETECTED.  The SARS-CoV-2 RNA is generally detectable in upper respiratory specimens during the acute phase of infection. The lowest concentration of SARS-CoV-2 viral copies this assay can detect is 138 copies/mL. A negative result does not preclude SARS-Cov-2 infection and should not be used as the sole basis for treatment or other patient management decisions. A negative result may occur with  improper specimen collection/handling, submission of specimen other than nasopharyngeal swab, presence of viral mutation(s) within the areas targeted by this assay, and inadequate number of viral copies(<138 copies/mL). A negative result must be combined with clinical observations, patient history, and epidemiological information. The expected result is  Negative.  Fact Sheet for Patients:  BloggerCourse.com  Fact Sheet for Healthcare Providers:  SeriousBroker.it  This test is no t yet approved or cleared by the [place] FDA and  has been authorized for detection and/or diagnosis of SARS-CoV-2 by FDA under an Emergency Use Authorization (EUA). This EUA will remain  in effect (meaning this test can be used) for the duration of the COVID-19 declaration under Section 564(b)(1) of the Act, 21 U.S.C.section 360bbb-3(b)(1), unless the authorization is terminated  or revoked sooner.       Influenza A by PCR NEGATIVE NEGATIVE   Influenza B by PCR NEGATIVE NEGATIVE    Comment: (NOTE) The Xpert Xpress SARS-CoV-2/FLU/RSV plus assay is intended as an aid in the diagnosis of influenza from Nasopharyngeal swab specimens and should not be used as a sole basis for treatment. Nasal washings and aspirates are unacceptable for Xpert Xpress SARS-CoV-2/FLU/RSV testing.  Fact Sheet for Patients: BloggerCourse.com  Fact Sheet for Healthcare Providers: SeriousBroker.it  This test is not yet approved or cleared by the [place] FDA and has been authorized for detection and/or diagnosis of SARS-CoV-2 by FDA under an Emergency Use Authorization (EUA). This EUA will remain in effect (meaning this test can be used) for the duration of the COVID-19 declaration under Section 564(b)(1) of the Act,  21 U.S.C. section 360bbb-3(b)(1), unless the authorization is terminated or revoked.     Resp Syncytial Virus by PCR NEGATIVE NEGATIVE    Comment: (NOTE) Fact Sheet for Patients: BloggerCourse.com  Fact Sheet for Healthcare Providers: SeriousBroker.it  This test is not yet approved or cleared by the [place] FDA and has been authorized for detection and/or diagnosis of SARS-CoV-2 by FDA under an  Emergency Use Authorization (EUA). This EUA will remain in effect (meaning this test can be used) for the duration of the COVID-19 declaration under Section 564(b)(1) of the Act, 21 U.S.C. section 360bbb-3(b)(1), unless the authorization is terminated or revoked.  Performed at [facility], [address] [place], [place] [address]   Lactic acid, plasma     Status: Abnormal   Collection Time: 06/24/23  7:23 AM  Result Value Ref Range   Lactic Acid, Venous 3.2 (HH) 0.5 - 1.9 mmol/L    Comment: CRITICAL VALUE NOTED. VALUE IS CONSISTENT WITH PREVIOUSLY REPORTED/CALLED VALUE MJU Performed at [facility], [address] [place], [place] [address]   Troponin I (High Sensitivity)     Status: Abnormal   Collection Time: 06/24/23  7:23 AM  Result Value Ref Range   Troponin I (High Sensitivity) 32 (H) <18 ng/L    Comment: (NOTE) Elevated high sensitivity troponin I (hsTnI) values and significant  changes across serial measurements may suggest ACS but many other  chronic and acute conditions are known to elevate hsTnI results.  Refer to the "Links" section for chest pain algorithms and additional  guidance. Performed at [facility], [address] [place], [place] [address]   D-dimer, quantitative     Status: Abnormal   Collection Time: 06/24/23  7:43 AM  Result Value Ref Range   D-Dimer, Quant 1.63 (H) 0.00 - 0.50 ug/mL-FEU    Comment: (NOTE) At the manufacturer cut-off value of 0.5 g/mL FEU, this assay has a negative predictive value of 95-100%.This assay is intended for use in conjunction with a clinical pretest probability (PTP) assessment model to exclude pulmonary embolism (PE) and deep venous thrombosis (DVT) in outpatients suspected of PE or DVT. Results should be correlated with clinical presentation. Performed at [facility], [address] [place], [place] [address]   Strep pneumoniae urinary antigen     Status: None    Collection Time: 06/24/23  9:42 AM  Result Value Ref Range   Strep Pneumo Urinary Antigen NEGATIVE NEGATIVE    Comment:        Infection due to S. pneumoniae cannot be absolutely ruled out since the antigen present may be below the detection limit of the test. Performed at [facility], [address], [place], [place] [address]   CBG monitoring, ED     Status: Abnormal   Collection Time: 06/24/23 11:39 AM  Result Value Ref Range   Glucose-Capillary 115 (H) 70 - 99 mg/dL    Comment: Glucose reference range applies only to samples taken after fasting for at least 8 hours.  CBG monitoring, ED     Status: Abnormal   Collection Time: 06/24/23  4:15 PM  Result Value Ref Range   Glucose-Capillary 118 (H) 70 - 99 mg/dL    Comment: Glucose reference range applies only to samples taken after fasting for at least 8 hours.  CBG monitoring, ED     Status: Abnormal   Collection Time: 06/25/23  2:08 AM  Result Value Ref Range   Glucose-Capillary 110 (H) 70 -  99 mg/dL    Comment: Glucose reference range applies only to samples taken after fasting for at least 8 hours.  Magnesium     Status: None   Collection Time: 06/25/23  4:35 AM  Result Value Ref Range   Magnesium 2.2 1.7 - 2.4 mg/dL    Comment: Performed at [facility], [address] [place], [place] [address]  CBC     Status: Abnormal   Collection Time: 06/25/23  4:36 AM  Result Value Ref Range   WBC 21.0 (H) 4.0 - 10.5 K/uL   RBC 3.33 (L) 3.87 - 5.11 MIL/uL   Hemoglobin 11.4 (L) 12.0 - 15.0 g/dL   HCT 96.2 (L) 95.2 - 84.1 %   MCV 102.7 (H) 80.0 - 100.0 fL   MCH 34.2 (H) 26.0 - 34.0 pg   MCHC 33.3 30.0 - 36.0 g/dL   RDW 32.4 40.1 - 02.7 %   Platelets 385 150 - 400 K/uL   nRBC 0.1 0.0 - 0.2 %    Comment: Performed at [facility], [address] [place], [place] [address]  Comprehensive metabolic panel     Status: Abnormal   Collection Time: 06/25/23  4:36 AM  Result Value Ref Range   Sodium 138 135  - 145 mmol/L   Potassium 2.9 (L) 3.5 - 5.1 mmol/L   Chloride 98 98 - 111 mmol/L   CO2 26 22 - 32 mmol/L   Glucose, Bld 106 (H) 70 - 99 mg/dL    Comment: Glucose reference range applies only to samples taken after fasting for at least 8 hours.   BUN 22 8 - 23 mg/dL   Creatinine, Ser 4.40 0.44 - 1.00 mg/dL   Calcium 8.0 (L) 8.9 - 10.3 mg/dL   Total Protein 6.0 (L) 6.5 - 8.1 g/dL   Albumin 3.4 (L) 3.5 - 5.0 g/dL   AST 42 (H) 15 - 41 U/L   ALT 61 (H) 0 - 44 U/L   Alkaline Phosphatase 63 38 - 126 U/L   Total Bilirubin 1.0 0.0 - 1.2 mg/dL   GFR, Estimated >34 >74 mL/min    Comment: (NOTE) Calculated using the CKD-EPI Creatinine Equation (2021)    Anion gap 14 5 - 15    Comment: Performed at [facility], [address] [place], [place] [address]  CBG monitoring, ED     Status: Abnormal   Collection Time: 06/25/23  7:08 AM  Result Value Ref Range   Glucose-Capillary 125 (H) 70 - 99 mg/dL    Comment: Glucose reference range applies only to samples taken after fasting for at least 8 hours.   CT Angio Chest Pulmonary Embolism (PE) W or WO Contrast Result Date: 06/24/2023 CLINICAL DATA:  Positive D-dimer.  Respiratory distress. EXAM: CT ANGIOGRAPHY CHEST WITH CONTRAST TECHNIQUE: Multidetector CT imaging of the chest was performed using the standard protocol during bolus administration of intravenous contrast. Multiplanar CT image reconstructions and MIPs were obtained to evaluate the vascular anatomy. RADIATION DOSE REDUCTION: This exam was performed according to the departmental dose-optimization program which includes automated exposure control, adjustment of the mA and/or kV according to patient size and/or use of iterative reconstruction technique. CONTRAST:  75mL OMNIPAQUE IOHEXOL 350 MG/ML SOLN COMPARISON:  Chest CT, 01/12/2023, and older exams. FINDINGS: Cardiovascular: Pulmonary arteries are well opacified. There is no evidence of a pulmonary embolism. Heart normal in size  configuration. Circumflex coronary artery calcifications. No pericardial effusion. Great vessels are normal in caliber. No aortic dissection. Aortic  atherosclerosis. Arch branch vessels are widely patent. Mediastinum/Nodes: No neck base, mediastinal or hilar masses. No enlarged lymph nodes. Trachea and esophagus are unremarkable. Lungs/Pleura: Moderate emphysema. Mild chronic apical pleuroparenchymal scarring. Dependent opacity in the left upper lobe, minimally in the right upper lobe. Ground-glass type opacity in the medial right lower lobe. Dependent opacity noted in the lower lobes, greater on the left with associated bronchial wall and interstitial thickening. Moderate right and small left pleural effusions. No pneumothorax. Upper Abdomen: No acute findings. Musculoskeletal: No fracture or acute finding. No bone lesion. No chest wall mass. Review of the MIP images confirms the above findings. IMPRESSION: 1. No evidence of a pulmonary embolism. 2. Dependent lung opacities as detailed associated with moderate right and small left pleural effusions. Suspect a combination of multifocal infection and dependent atelectasis. Consider a component pulmonary edema in the proper clinical setting. 3. Moderate emphysema. Aortic Atherosclerosis (ICD10-I70.0) and Emphysema (ICD10-J43.9). Electronically Signed   By: Amie Portland M.D.   On: 06/24/2023 10:17   DG Chest Port 1 View Result Date: 06/24/2023 CLINICAL DATA:  Shortness of breath EXAM: PORTABLE CHEST 1 VIEW COMPARISON:  Two days prior FINDINGS: Interval diffuse reticular nodular opacity, although asymmetric to the left, with small pleural effusions. Normal heart size and mediastinal contours. Extensive artifact from EKG leads. IMPRESSION: New reticulonodular opacity asymmetric to the left lung with small pleural effusions, asymmetry favoring pneumonia over edema. Electronically Signed   By: Tiburcio Pea M.D.   On: 06/24/2023 06:09    Pending Labs Unresulted  Labs (From admission, onward)     Start     Ordered   06/24/23 0944  Hemoglobin A1c  Once,   R       Comments: To assess prior glycemic control    06/24/23 0943   06/24/23 0916  Legionella Pneumophila Serogp 1 Ur Ag  (COPD / Pneumonia / Cellulitis / Lower Extremity Wound)  Once,   R        06/24/23 0917            Vitals/Pain Today's Vitals   06/25/23 0630 06/25/23 0700 06/25/23 0730 06/25/23 0737  BP: (!) 133/93 136/89 (!) 136/90   Pulse: (!) 144 (!) 146 (!) 142   Resp: (!) 27 (!) 27 (!) 23   Temp:    98.3 F (36.8 C)  TempSrc:    Oral  SpO2: 100% 98% 95%   PainSc:        Isolation Precautions No active isolations  Medications Medications  lactated ringers infusion (0 mLs Intravenous Stopped 06/25/23 0315)  ondansetron (ZOFRAN) tablet 4 mg (has no administration in time range)    Or  ondansetron (ZOFRAN) injection 4 mg (has no administration in time range)  cefTRIAXone (ROCEPHIN) 2 g in sodium chloride 0.9 % 100 mL IVPB (0 g Intravenous Stopped 06/25/23 0551)  azithromycin (ZITHROMAX) 500 mg in sodium chloride 0.9 % 250 mL IVPB (0 mg Intravenous Stopped 06/25/23 0557)  methylPREDNISolone sodium succinate (SOLU-MEDROL) 40 mg/mL injection 40 mg (40 mg Intravenous Given 06/25/23 0322)    Followed by  predniSONE (DELTASONE) tablet 40 mg (has no administration in time range)  albuterol (PROVENTIL) (2.5 MG/3ML) 0.083% nebulizer solution 2.5 mg (has no administration in time range)  ipratropium-albuterol (DUONEB) 0.5-2.5 (3) MG/3ML nebulizer solution 3 mL (3 mLs Nebulization Given 06/25/23 0710)  insulin aspart (novoLOG) injection 0-9 Units ( Subcutaneous Patient Refused/Not Given 06/25/23 0709)  insulin aspart (novoLOG) injection 4 Units (4 Units Subcutaneous Patient Refused/Not Given 06/25/23 0709)  apixaban (ELIQUIS) tablet 5 mg (5 mg Oral Given 06/24/23 2149)  metoprolol succinate (TOPROL-XL) 24 hr tablet 50 mg (has no administration in time range)  potassium chloride 10 mEq in  100 mL IVPB (10 mEq Intravenous New Bag/Given 06/25/23 0657)  ipratropium-albuterol (DUONEB) 0.5-2.5 (3) MG/3ML nebulizer solution 3 mL (3 mLs Nebulization Given 06/24/23 0624)  ipratropium-albuterol (DUONEB) 0.5-2.5 (3) MG/3ML nebulizer solution 3 mL (3 mLs Nebulization Given 06/24/23 0624)  vancomycin (VANCOCIN) IVPB 1000 mg/200 mL premix (0 mg Intravenous Stopped 06/24/23 0743)  ceFEPIme (MAXIPIME) 2 g in sodium chloride 0.9 % 100 mL IVPB (0 g Intravenous Stopped 06/24/23 0727)  furosemide (LASIX) injection 40 mg (40 mg Intravenous Given 06/24/23 0727)  iohexol (OMNIPAQUE) 350 MG/ML injection 75 mL (75 mLs Intravenous Contrast Given 06/24/23 0948)  metoprolol tartrate (LOPRESSOR) injection 5 mg (5 mg Intravenous Given 06/24/23 2311)  metoprolol tartrate (LOPRESSOR) injection 5 mg (5 mg Intravenous Given 06/25/23 0056)  ALPRAZolam (XANAX) tablet 0.25 mg (0.25 mg Oral Given 06/25/23 0425)  potassium chloride SA (KLOR-CON M) CR tablet 40 mEq (40 mEq Oral Given 06/25/23 0700)    Mobility walks with person assist     Focused Assessments Cardiac Assessment Handoff:  Cardiac Rhythm: Sinus tachycardia Lab Results  Component Value Date   TROPONINI 0.03 08/02/2015   Lab Results  Component Value Date   DDIMER 1.63 (H) 06/24/2023   Does the Patient currently have chest pain? No    R Recommendations: See Admitting Provider Note  Report given to:   Additional Notes:

## 2023-06-25 NOTE — Progress Notes (Signed)
PT Cancellation Note  Patient Details Name: Shylan Matczak MRN: [id number] DOB: 12-14-1945   Cancelled Treatment:     PT evaluation and Treatment  not completed today 2/2 pt Tachycardic, Volatile BP and Potassium 2.9. PT communicated with MD. MD advised to hold the pt for today. Will attempt again tomorrow.    Mashell Sieben H Muhsin Doris 06/25/2023, 10:34 AM

## 2023-06-25 NOTE — Progress Notes (Signed)
Patient started having hallucinations. Per visitor at bedside, she stated that they patient did have some hallucinations prior to admission as well. Made both Dr. Ashok Pall and Eula Listen aware as well

## 2023-06-25 NOTE — ED Notes (Addendum)
Informed NP Manuela Schwartz NP, pt's hr has not improved after administration of 5mg  Metoprolol IV, NP Manuela Schwartz ordered 5mg  Lopressor IVP once. RN acknowledged order.

## 2023-06-25 NOTE — Consult Note (Signed)
Cardiology Consultation:   Patient ID: Taylor Chambers; [id number]; 28-Feb-1946   Admit date: 06/24/2023 Date of Consult: 06/25/2023  Primary Care Provider: Mick Sell, MD Primary Cardiologist: Dr. Mayford Knife, MD Primary Electrophysiologist:  None   Patient Profile:   Taylor Chambers is a 78 y.o. female with a hx of nonobstructive CAD by coronary CTA in 04/2022, HFimpEF, PAF diagnosed in 09/2022, moderate mitral regurgitation, HTN, HLD, bipolar disorder, and COPD with prior tobacco use quitting in 2023 who is being seen today for the evaluation of atrial flutter with RVR at the request of Dr. Ashok Pall, MD.  History of Present Illness:   Taylor Chambers was previously followed by Taylor Chambers, establishing care with Taylor Chambers in 12/2022.  Echo in 02/2020 showed an EF greater than 55%, normal wall motion, grade 1 diastolic dysfunction, normal RV systolic function, moderate mitral regurgitation, and mild to moderate tricuspid regurgitation.  Lexiscan MPI in 02/2020 showed no evidence of ischemia with an EF of 64%.  Echo in 01/2022 showed an EF of 45% with moderate mitral regurgitation.  Coronary CTA in 04/2022 showed a calcium score of 95, which was the 59th percentile.  There was 25% proximal LCx stenosis with less than 25% proximal RCA stenosis.  Noncardiac overread showed no acute extracardiac findings with resolution of tree-in-bud nodules when compared to prior study as well as a solid pulmonary nodules in the right lower lobe that were unchanged when compared to study from 07/2021 with recommendation for annual follow-up (patient already undergoing lung cancer screening CTs).  She was admitted to the hospital in 09/2022 with COPD exacerbation and was noted to be in A-fib with RVR on presentation.  She converted to sinus rhythm with diltiazem.  She was not discharged on anticoagulation.  Upon establishing care with our office in 12/2022, she did note occasional exertional chest pain and shortness of breath as well as  frequent palpitations, particularly with exertion.  She was in sinus rhythm with PACs.  To further evaluate palpitation burden, she underwent a Zio patch that showed a predominant rhythm of sinus with an average rate of 84 bpm (range 56 to 214 bpm), 106 episodes of SVT lasting up to 2 minutes and 10 seconds, 1% A-fib/flutter burden with ventricular rates ranging from 119 to 213 bpm with an average ventricular rate of 153 bpm and the longest episode lasting 2 hours and 4 minutes.  Echo in 01/2023 showed an EF of 55 to 60%, no regional wall motion abnormalities, normal RV systolic function, ventricular cavity size, and RVSP, moderate to severe mitral valve regurgitation with mild to late systolic prolapse of multiple segments of the anterior leaflet of the mitral valve, mild to moderate tricuspid regurgitation, and an estimated right atrial pressure of 3 mmHg.  She was last seen in the office on 04/14/2023 and was without symptoms of angina or cardiac decompensation.  To further evaluate her mitral regurgitation, she underwent TEE on 04/20/2023 that showed an EF of 60 to 65%, no regional wall motion maladies, normal RV systolic function and ventricular cavity size, no left atrial or left atrial appendage thrombus, moderate mitral regurgitation, aortic atherosclerosis involving the aortic arch and descending aorta, estimated right atrial pressure of 3 mmHg, and no evidence of interatrial shunt.  She was most recently seen in the office in 05/2023 and was without symptoms of angina or cardiac decompensation.  She was seen in the ED on 04/21/2024 with a several week history of shortness of breath.  High-sensitivity troponin 36 with  a delta troponin 25.  BNP 1169.  Chest x-ray with blunting of the right costophrenic angle with mild basilar atelectasis and COPD.  EKG at that time showed sinus rhythm with rare PVC.  She had symptomatic improvement with DuoNeb.  She was treated for COPD exacerbation with doxycycline,  albuterol, and prednisone.  She returned to the ED in the early morning hours of 06/24/2023 with increased confusion and continued dyspnea with associated malaise and fatigue and orthopnea.  She was not acute hypercarbic and hypoxic respiratory failure initially requiring BiPAP.  No chest pain, palpitations, lower extremity swelling, or abdominal distention.  She has reported diminished appetite.  She was admitted with acute hypoxic respiratory failure secondary to COPD exacerbation and acute on chronic HFpEF.  Upon presentation she was maintaining sinus rhythm.  Workup notable for an initial and peak high-sensitivity troponin of 31 with a delta troponin 32.  D-dimer elevated at 1.63.  BNP 1629 which is increased from 1169 in the ED 3 days prior.  Chest x-ray showed new reticulonodular opacity asymmetric to the left lung with small pleural effusions favoring pneumonia over edema.  CTA chest negative for PE with dependent lung opacities associated with moderate right and small left pleural effusions suspicious for multifocal infection and dependent atelectasis as well as moderate emphysema.  In the ED and upon admission, she was treated with IV azithromycin, cefepime, vancomycin, IV Lasix 40 mg, DuoNebs, lactated Ringer's, and Solu-Medrol.  Overnight, she was noted to be tachycardic with rates in the 150s to 160s bpm.  This occurred in the setting of hypoxia with the patient not wearing supplemental oxygen.  Overnight and in the early morning hours she received IV metoprolol 5 mg x 2.  PTA metoprolol and apixaban were not continued upon admission, though resumed overnight after the patient became tachycardic.  Tachycardic rates have persisted in the 140s bpm with repeat EKG this morning concerning for atrial flutter with 2-1 AV block with a rate of 140 bpm.  Review of telemetry shows rates have been locked and ranging from 137 to 142 bpm.  This morning, labs notable for potassium 2.9 (4.0 upon presentation), albumin  3.4, improving AST/ALT, WBC 21 (receiving steroids), Hgb 11.4.  At time of cardiology consult, she remains tachycardic with a rate of 140 bpm and has received PTA Toprol-XL 50 mg along with IV Lasix 40 mg.  Son at bedside.  Patient reports continued shortness of breath and a jitteriness.  She reports she does not feel well, though has previously felt worse.  She is currently without symptoms of angina.    Past Medical History:  Diagnosis Date   Allergies    Anxiety    Aortic atherosclerosis (HCC)    Bipolar affective disorder (HCC)    COPD (chronic obstructive pulmonary disease) (HCC)    Coronary artery calcification seen on CT scan    Depression    Diastolic dysfunction 02/24/2020   a.) TTE 02/24/2020: EF >55%; triv PR, mild TR, mod MR; G1DD.   DOE (dyspnea on exertion)    Emphysema lung (HCC)    History of cataract    HLD (hyperlipidemia)    Hypertension    Macular degeneration    Migraines    Osteoporosis    Pneumonia    Tobacco use     Past Surgical History:  Procedure Laterality Date   CATARACT EXTRACTION     COLONOSCOPY N/A 08/30/2021   Procedure: COLONOSCOPY;  Surgeon: Jaynie Collins, DO;  Location: [facility] ENDOSCOPY;  Service:  Gastroenterology;  Laterality: N/A;   DILATION AND CURETTAGE OF UTERUS     TEE WITHOUT CARDIOVERSION N/A 04/20/2023   Procedure: TRANSESOPHAGEAL ECHOCARDIOGRAM;  Surgeon: Antonieta Iba, MD;  Location: [facility] ORS;  Service: Cardiovascular;  Laterality: N/A;   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   Procedure: TOTAL HIP ARTHROPLASTY;  Surgeon: Donato Heinz, MD;  Location: [facility] ORS;  Service: Orthopedics;  Laterality: Right;   WISDOM TOOTH EXTRACTION     WRIST GANGLION EXCISION       Home Meds: Prior to Admission medications   Medication Sig Start Date End Date Taking? Authorizing Provider  albuterol (VENTOLIN HFA) 108 (90 Base) MCG/ACT inhaler Inhale 2 puffs into the lungs every 6 (six) hours as needed for wheezing or shortness of  breath. 06/22/23  Yes Claybon Jabs, MD  apixaban (ELIQUIS) 5 MG TABS tablet Take 1 tablet (5 mg total) by mouth 2 (two) times daily. 12/13/22  Yes Iran Ouch, MD  Calcium Carbonate (CALCIUM 500 PO) Take 500 mg by mouth daily at 12 noon.   Yes [provider]  Cholecalciferol (VITAMIN D3) 1.25 MG (50000 UT) CAPS Take 5,000 Units by mouth daily in the afternoon.   Yes [provider]  doxycycline (VIBRA-TABS) 100 MG tablet Take 1 tablet (100 mg total) by mouth 2 (two) times daily for 10 days. 06/22/23 07/02/23 Yes Claybon Jabs, MD  Dupilumab 300 MG/2ML SOAJ Inject 300 mg into the skin every 14 (fourteen) days. Saturday 05/26/23  Yes [provider]  EPINEPHrine 0.3 mg/0.3 mL IJ SOAJ injection Inject 0.3 mg into the muscle as needed for anaphylaxis. 05/26/23  Yes [provider]  ibuprofen (ADVIL) 200 MG tablet Take 400 mg by mouth every 6 (six) hours as needed for headache.   Yes [provider]  Ipratropium-Albuterol (COMBIVENT RESPIMAT) 20-100 MCG/ACT AERS respimat Inhale 1 puff into the lungs 2 (two) times daily.   Yes [provider]  ipratropium-albuterol (DUONEB) 0.5-2.5 (3) MG/3ML SOLN Take 3 mLs by nebulization 2 (two) times daily. Mix with budesonide   Yes [provider]  losartan (COZAAR) 100 MG tablet Take 100 mg by mouth daily.   Yes [provider]  metoprolol succinate (TOPROL-XL) 50 MG 24 hr tablet Take 1 tablet (50 mg total) by mouth daily. Take with or immediately following a meal. 01/06/23 06/24/23 Yes Iran Ouch, MD  Multiple Vitamins-Minerals (PRESERVISION AREDS 2+MULTI VIT PO) Take 2 tablets by mouth daily.   Yes [provider]  omeprazole (PRILOSEC) 40 MG capsule Take 40 mg by mouth daily as needed (Heartburn). 03/31/22 06/24/23 Yes [provider]  pravastatin (PRAVACHOL) 10 MG tablet Take 10 mg by mouth daily.   Yes [provider]  predniSONE (DELTASONE) 20 MG tablet Take 2  tablets (40 mg total) by mouth daily with breakfast for 4 days. 06/23/23 06/27/23 Yes Claybon Jabs, MD  sertraline (ZOLOFT) 100 MG tablet Take 100 mg by mouth daily. 06/29/22 06/29/23 Yes [provider]  sulfamethoxazole-trimethoprim (BACTRIM) 400-80 MG tablet Take 1 tablet by mouth 3 (three) times a week. Monday, Wednesday and Friday 03/06/23  Yes [provider]  OHTUVAYRE 3 MG/2.5ML SUSP Take 3 mg by nebulization daily. 05/31/23   [provider]    Inpatient Medications: Scheduled Meds:  amiodarone  150 mg Intravenous Once   apixaban  5 mg Oral BID   Chlorhexidine Gluconate Cloth  6 each Topical Daily   insulin aspart  0-9 Units Subcutaneous TID WC  insulin aspart  4 Units Subcutaneous TID WC   ipratropium-albuterol  3 mL Nebulization Q6H   metoprolol tartrate  12.5 mg Oral Q6H   predniSONE  40 mg Oral Q breakfast   sertraline  100 mg Oral Daily   Continuous Infusions:  amiodarone     Followed by   amiodarone     [START ON 06/26/2023] azithromycin     [START ON 06/26/2023] cefTRIAXone (ROCEPHIN)  IV     potassium chloride 10 mEq (06/25/23 0920)   PRN Meds: albuterol, ondansetron **OR** ondansetron (ZOFRAN) IV  Allergies:   Allergies  Allergen Reactions   Bupropion Other (See Comments)    Foggy brain    Social History:   Social History   Socioeconomic History   Marital status: Married    Spouse name: Dorene Sorrow   Number of children: Not on file   Years of education: Not on file   Highest education level: Not on file  Occupational History   Not on file  Tobacco Use   Smoking status: Former    Current packs/day: 0.00    Average packs/day: 0.3 packs/day for 50.0 years (12.5 ttl pk-yrs)    Types: Cigarettes    Start date: 01/10/1972    Quit date: 01/09/2022    Years since quitting: 1.4   Smokeless tobacco: Never  Vaping Use   Vaping status: Never Used  Substance and Sexual Activity   Alcohol use: Never   Drug use: Never   Sexual activity: Not  on file  Other Topics Concern   Not on file  Social History Narrative   Not on file   Social Drivers of Health   Financial Resource Strain: Low Risk  (03/02/2023)   Received from [facility]   Overall Financial Resource Strain (CARDIA)    Difficulty of Paying Living Expenses: Not hard at all  Food Insecurity: No Food Insecurity (03/02/2023)   [facility] from [facility]   Hunger Vital Sign    Worried About Running Out of Food in the Last Year: Never true    Ran Out of Food in the Last Year: Never true  Transportation Needs: No Transportation Needs (03/02/2023)   Received from [facility] - Transportation    In the past 12 months, has lack of transportation kept you from medical appointments or from getting medications?: No    Lack of Transportation (Non-Medical): No  Physical Activity: Not on file  Stress: Not on file  Social Connections: Not on file  Intimate Partner Violence: Not At Risk (09/05/2022)   Humiliation, Afraid, Rape, and Kick questionnaire    Fear of Current or Ex-Partner: No    Emotionally Abused: No    Physically Abused: No    Sexually Abused: No     Family History:   Family History  Problem Relation Age of Onset   Breast cancer Mother 28   Heart Problems Maternal Uncle    Heart Problems Paternal Aunt    Heart Problems Paternal Uncle     ROS:  Review of Systems  Constitutional:  Positive for malaise/fatigue. Negative for chills, diaphoresis, fever and weight loss.  HENT:  Negative for congestion.   Eyes:  Negative for discharge and redness.  Respiratory:  Positive for cough and shortness of breath. Negative for sputum production and wheezing.   Cardiovascular:  Positive for palpitations and orthopnea. Negative for chest pain, claudication, leg swelling and PND.  Gastrointestinal:  Negative for abdominal pain, heartburn, nausea  and vomiting.  Musculoskeletal:  Negative for falls and myalgias.   Skin:  Negative for rash.  Neurological:  Positive for weakness. Negative for dizziness, tingling, tremors, sensory change, speech change, focal weakness and loss of consciousness.  Endo/Heme/Allergies:  Does not bruise/bleed easily.  Psychiatric/Behavioral:  Negative for substance abuse. The patient is nervous/anxious.   All other systems reviewed and are negative.     Physical Exam/Data:   Vitals:   06/25/23 0730 06/25/23 0737 06/25/23 0832 06/25/23 0900  BP: (!) 136/90  114/73 135/77  Pulse: (!) 142  (!) 144 (!) 146  Resp: (!) 23  (!) 23 (!) 36  Temp:  98.3 F (36.8 C) 98.4 F (36.9 C)   TempSrc:  Oral Oral   SpO2: 95%  93% 100%  Weight:   50.4 kg    No intake or output data in the 24 hours ending 06/25/23 0953 Filed Weights   06/25/23 0832  Weight: 50.4 kg   Body mass index is 20.32 kg/m.   Physical Exam: General: Well developed, well nourished, in no acute distress. Head: Normocephalic, atraumatic, sclera non-icteric, no xanthomas, nares without discharge.  Neck: Negative for carotid bruits. JVD 6 cm. Lungs: Diminished breath sounds with scattered rhonchi bilaterally. Breathing is unlabored. Heart: Tachycardic with S1 S2. I/VI systolic murmur LLSB, no rubs, or gallops appreciated. Abdomen: Soft, non-tender, non-distended with normoactive bowel sounds. No hepatomegaly. No rebound/guarding. No obvious abdominal masses. Msk:  Strength and tone appear normal for age. Extremities: No clubbing or cyanosis. No edema. Distal pedal pulses are 2+ and equal bilaterally. Neuro: Alert and oriented X 3. No facial asymmetry. No focal deficit. Moves all extremities spontaneously. Psych:  Responds to questions appropriately with a normal affect.   EKG:  The EKG was personally reviewed and demonstrates: 06/24/2023 - NSR, 95 bpm, occasional PACs, right axis deviation, baseline artifact, nonspecific ST-T changes.  06/25/2023 -narrow complex tachycardia concerning for atrial flutter with  2-1 AV block, 140 bpm, borderline right axis deviation, nonspecific ST-T changes Telemetry:  Telemetry was personally reviewed and demonstrates: Atrial flutter with RVR with 2-1 AV block, ventricular rates ranging from 140 to 142 bpm  Weights: Filed Weights   06/25/23 0832  Weight: 50.4 kg    Relevant CV Studies:  TEE 04/20/2023: 1. Left ventricular ejection fraction, by estimation, is 60 to 65%. The  left ventricle has normal function. The left ventricle has no regional  wall motion abnormalities.   2. Right ventricular systolic function is normal. The right ventricular  size is normal.   3. No left atrial/left atrial appendage thrombus was detected.   4. The mitral valve is normal in structure. Moderate mitral valve  regurgitation. No evidence of mitral stenosis. No significant valve  prolapse noted.   5. The aortic valve is normal in structure. Aortic valve regurgitation is  not visualized. No aortic stenosis is present.   6. There is mild (Grade II) atheroma plaque involving the aortic arch and  descending aorta.   7. The inferior vena cava is normal in size with greater than 50%  respiratory variability, suggesting right atrial pressure of 3 mmHg.   8. Agitated saline contrast bubble study was negative, with no evidence  of any interatrial shunt.   Conclusion(s)/Recommendation(s): Normal biventricular function without  evidence of hemodynamically significant valvular heart disease.   FINDINGS   Left Ventricle: Left ventricular ejection fraction, by estimation, is 60  to 65%. The left ventricle has normal function. The left ventricle has no  regional wall  motion abnormalities. The left ventricular internal cavity  size was normal in size. There is   no left ventricular hypertrophy.  __________   2D echo 01/26/2023: 1. Left ventricular ejection fraction, by estimation, is 55 to 60%. The  left ventricle has normal function. The left ventricle has no regional  wall motion  abnormalities. Left ventricular diastolic parameters are  indeterminate. The average left  ventricular global longitudinal strain is -19.0 %.   2. Right ventricular systolic function is normal. The right ventricular  size is normal. There is normal pulmonary artery systolic pressure. The  estimated right ventricular systolic pressure is 28.8 mmHg.   3. The mitral valve is normal in structure. Moderate to severe mitral  valve regurgitation. No evidence of mitral stenosis. There is mild late  systolic prolapse of multiple segments of the anterior leaflet of the  mitral valve.   4. Tricuspid valve regurgitation is mild to moderate.   5. The aortic valve is normal in structure. Aortic valve regurgitation is  not visualized. No aortic stenosis is present.   6. The inferior vena cava is normal in size with greater than 50%  respiratory variability, suggesting right atrial pressure of 3 mmHg.  __________   Taylor Chambers patch 12/2022: Patient had a min HR of 56 bpm, max HR of 214 bpm, and avg HR of 84 bpm. Predominant underlying rhythm was Sinus Rhythm. 106 Supraventricular Tachycardia runs occurred, the run with the fastest interval lasting 6 beats with a max rate of 214 bpm, the longest lasting 2 mins 10 secs with an avg rate of 146 bpm. Atrial Fibrillation/Flutter occurred (1% burden), ranging from 119-213 bpm (avg of 153 bpm), the longest lasting 2 hours 4 mins with an avg rate of 152 bpm.  Occasional PACs and rare PVCs. __________   Coronary CTA 04/07/2022: FINDINGS: Aorta: Normal size. Mild aortic root and descending aorta calcifications. No dissection.   Aortic Valve:  Trileaflet.  No calcifications.   Coronary Arteries:  Normal coronary origin.  Right dominance.   RCA is a dominant artery that gives rise to PDA and PLA. There is calcified plaque proximally causing minimal stenosis (<25%).   Left main gives rise to LAD and LCX arteries.  LM has no stenosis.   LAD has no plaque.   LCX is a  non-dominant artery that gives rise to two obtuse marginal branches. There is calcified plaque proximally causing mild stenosis (25-49%).   Other findings:   Normal pulmonary vein drainage into the left atrium.   Normal left atrial appendage without a thrombus.   Normal size of the pulmonary artery.   IMPRESSION: 1. Coronary calcium score of 95.3. This was 59th percentile for age and sex matched control. 2. Normal coronary origin with right dominance. 3. Mild proximal LCx stenosis (25%). 4. Minimal proximal RCA stenosis (<25%). 5. CAD-RADS 2. Mild non-obstructive CAD (25-49%). Consider non-atherosclerotic causes of chest pain. Consider preventive therapy and risk factor modification. 6. Image quality degraded by motion artifacts.   Noncardiac overread: IMPRESSION: 1. No acute extracardiac findings. 2. Tree-in-bud nodules which were present on most recent prior chest CT have resolved, likely sequela of infection or aspiration. 3. Solid pulmonary nodules of the right lower lobe are unchanged in size when compared with prior lung cancer screening CT chest CT dated July 16, 2021. Recommend attention on annual lung cancer screening CT. 4. Aortic Atherosclerosis and Emphysema. __________   2D echo 01/18/2022 Gavin Potters): MILD LV SYSTOLIC DYSFUNCTION (See above)  NORMAL RIGHT VENTRICULAR SYSTOLIC FUNCTION  NO VALVULAR STENOSIS  MODERATE MR  MILD TR, PR  EF 45%  __________   Eugenie Birks MPI 02/24/2020 Gavin Potters): Normal myocardial perfusion scan no evidence of stress-induced  medical ischemia ejection fraction of 64% conclusion negative scan  __________   2D echo 02/24/2020 Gavin Potters): NORMAL LEFT VENTRICULAR SYSTOLIC FUNCTION WITH AN ESTIMATED EF = >55 %  NORMAL RIGHT VENTRICULAR SYSTOLIC FUNCTION  MODERATE MITRAL VALVE INSUFFICIENCY  MILD-TO-MODERATE TRICUSPID VALVE INSUFFICIENCY  NO VALVULAR STENOSIS    Laboratory Data:  Chemistry Recent Labs  Lab 06/22/23 0944  06/24/23 0534 06/25/23 0436  NA 145 142 138  K 4.0 4.0 2.9*  CL 107 103 98  CO2 25 24 26   GLUCOSE 125* 349* 106*  BUN 23 29* 22  CREATININE 0.89 0.87 0.67  CALCIUM 9.2 8.6* 8.0*  GFRNONAA >60 >60 >60  ANIONGAP 13 15 14     Recent Labs  Lab 06/22/23 0944 06/24/23 0534 06/25/23 0436  PROT 6.3* 6.4* 6.0*  ALBUMIN 3.4* 3.4* 3.4*  AST 72* 97* 42*  ALT 57* 86* 61*  ALKPHOS 63 68 63  BILITOT 0.9 0.8 1.0   Hematology Recent Labs  Lab 06/22/23 0944 06/24/23 0534 06/25/23 0436  WBC 10.5 18.5* 21.0*  RBC 3.40* 3.67* 3.33*  HGB 11.7* 12.5 11.4*  HCT 36.0 39.9 34.2*  MCV 105.9* 108.7* 102.7*  MCH 34.4* 34.1* 34.2*  MCHC 32.5 31.3 33.3  RDW 13.9 14.1 13.9  PLT 339 444* 385   Cardiac EnzymesNo results for input(s): "TROPONINI" in the last 168 hours. No results for input(s): "TROPIPOC" in the last 168 hours.  BNP Recent Labs  Lab 06/22/23 0944 06/24/23 0534  BNP 1,169.8* 1,629.4*    DDimer  Recent Labs  Lab 06/24/23 0743  DDIMER 1.63*    Radiology/Studies:  CT Angio Chest Pulmonary Embolism (PE) W or WO Contrast Result Date: 06/24/2023 IMPRESSION: 1. No evidence of a pulmonary embolism. 2. Dependent lung opacities as detailed associated with moderate right and small left pleural effusions. Suspect a combination of multifocal infection and dependent atelectasis. Consider a component pulmonary edema in the proper clinical setting. 3. Moderate emphysema. Aortic Atherosclerosis (ICD10-I70.0) and Emphysema (ICD10-J43.9). Electronically Signed   By: Amie Portland M.D.   On: 06/24/2023 10:17   DG Chest Port 1 View Result Date: 06/24/2023 IMPRESSION: New reticulonodular opacity asymmetric to the left lung with small pleural effusions, asymmetry favoring pneumonia over edema. Electronically Signed   By: Tiburcio Pea M.D.   On: 06/24/2023 06:09   DG Chest 2 View Result Date: 06/22/2023 IMPRESSION: 1. Blunting of the right costophrenic angle with mild basilar atelectasis could  reflect a small pleural effusion or pleural scarring. 2. COPD. Electronically Signed   By: Hart Robinsons M.D.   On: 06/22/2023 10:47    Assessment and Plan:   1.  Acute hypoxic and hypercarbic respiratory failure: -Multifactorial including COPD exacerbation with possible pneumonia now exacerbated by atrial flutter with RVR -Ongoing management of acute pulmonary illness per primary service -Wean supplemental oxygen as able -Would benefit from transitioning of albuterol to Xopenex to aid in tachycardic rates, if felt appropriate by primary service  2.  Newly diagnosed atrial flutter with RVR with history of PAF: -Patient was noted to be in sinus rhythm during ED visit on 1/16 and continued sinus rhythm upon admission on 1/18 with development of atrial flutter with 2-1 AV block with rate 140 bpm on the overnight hours going into 1/19 -Suspect her atrial flutter is secondary to acute pulmonary illness, significant hypokalemia,  steroids, and nebulizers  -Stop Toprol-XL, start Lopressor 12.5 mg every 6 hours -Given that she was documented to be in sinus rhythm upon presentation on 1/18 with development of atrial flutter on 1/19, start IV amiodarone in an effort to pharmacologically cardiovert -If ventricular rates remain difficult to control despite optimization of pharmacotherapy, she would require cardioversion, hopefully with escalation in medication and improvement in respiratory status this can be avoided -PTA apixaban 5 mg twice daily -CHA2DS2-VASc at least 6  3.  Nonobstructive CAD with elevated high-sensitivity troponin: -Currently without symptoms of chest pain -Mildly elevated and flat trending, not reflective of ACS -Continue PTA apixaban as outlined above -Echo to evaluate for recurrence of cardiomyopathy as outlined below -PTA statin held with mildly elevated LFTs, resume as able  4.  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  Mitral regurgitation: -Moderate by TEE in 04/2023 -Outpatient follow-up  6.  HTN: -Blood pressure currently well-controlled -Pharmacotherapy as outlined above  7.  Hypokalemia: -Likely in the setting of IV diuresis in the ER -Being repleted IV, recommend goal 4.0 -Magnesium at goal      For questions or updates, please contact [facility] Please consult [email or web address] for contact info under Cardiology/STEMI.   Signed, Eula Listen, PA-C [facility] Pager: [phone] 06/25/2023, 9:53 AM

## 2023-06-25 NOTE — ED Notes (Signed)
Introducedmyself to patient. Patient is pleasant and alert and oriented x4. Patient denies chest pain. States she is not sure why her heart rate is elevated. Patient was bladder scanned with 51ml present. Patient was placed on the bed pan and voided approximately 45 ml. Patient does not feel like she is retaining urine. Patient states she has not slept and is tired. She is somewhat jittery. Will ask the provider for something to help her sleep.

## 2023-06-25 NOTE — Progress Notes (Addendum)
       CROSS COVER NOTE  NAME: Taylor Chambers MRN: [id number] DOB : 1945/08/12 ATTENDING PHYSICIAN: Kathrynn Running, MD    Date of Service   06/25/2023   HPI/Events of Note   Patient became hypoxic (66%) with tachycardia (180) around 2030 pm standing up aat bedside. Sats improved with oxygen at 2 LNC  Interventions   Assessment/Plan: Home metoprolol and eliquis dos resumed from home meds  Despite addition that and addition 2 doses of 5 mg metoprolol + xanax for feeling "fidgety" and she admitted to being anxious as she never had something like this happen before. CTA chest done prior tuled out PE.  A dose of xanax given to help her rest.  While resting heart rate remains 140s BP stable. Patient denies complaints Mag added to am labs and lab informed      Donnie Mesa NP [facility] Cross Cover 7pm-7am - check amion for availability Pager [phone]

## 2023-06-25 NOTE — Progress Notes (Signed)
OT Cancellation Note  Patient Details Name: [name] MRN: [id number] DOB: Nov 23, 1945   Cancelled Treatment:    Reason Eval/Treat Not Completed: Patient not medically ready. Order received and per discussion with PT who spoke with MD, hold therapy services today secondary to pt tachycardic, Volatile BP and Potassium 2.9. Will re-attempt as medically stable.  Constance Goltz 06/25/2023, 1:33 PM

## 2023-06-25 NOTE — Progress Notes (Signed)
Made Dr. Ashok Pall aware patient heart rate remaining in 140, feeling anxious, currently nothing ordered for nerves. Will give scheduled metoprolol. Per md she is next on the list to see. Will continue to monitor

## 2023-06-26 ENCOUNTER — Inpatient Hospital Stay: Payer: Medicare HMO

## 2023-06-26 ENCOUNTER — Other Ambulatory Visit: Payer: Self-pay

## 2023-06-26 ENCOUNTER — Inpatient Hospital Stay (HOSPITAL_COMMUNITY)
Admit: 2023-06-26 | Discharge: 2023-06-26 | Disposition: A | Discharge status: Home / Self Care | Payer: Medicare HMO | Attending: Medical | Admitting: Medical

## 2023-06-26 DIAGNOSIS — R7989 Other specified abnormal findings of blood chemistry: Secondary | ICD-10-CM

## 2023-06-26 DIAGNOSIS — I5031 Acute diastolic (congestive) heart failure: Secondary | ICD-10-CM

## 2023-06-26 DIAGNOSIS — J9601 Acute respiratory failure with hypoxia: Secondary | ICD-10-CM | POA: Diagnosis not present

## 2023-06-26 DIAGNOSIS — J189 Pneumonia, unspecified organism: Secondary | ICD-10-CM | POA: Diagnosis not present

## 2023-06-26 DIAGNOSIS — I340 Nonrheumatic mitral (valve) insufficiency: Secondary | ICD-10-CM

## 2023-06-26 DIAGNOSIS — I480 Paroxysmal atrial fibrillation: Secondary | ICD-10-CM

## 2023-06-26 DIAGNOSIS — J9602 Acute respiratory failure with hypercapnia: Secondary | ICD-10-CM

## 2023-06-26 DIAGNOSIS — J441 Chronic obstructive pulmonary disease with (acute) exacerbation: Secondary | ICD-10-CM | POA: Diagnosis not present

## 2023-06-26 DIAGNOSIS — I2510 Atherosclerotic heart disease of native coronary artery without angina pectoris: Secondary | ICD-10-CM | POA: Diagnosis not present

## 2023-06-26 DIAGNOSIS — J90 Pleural effusion, not elsewhere classified: Secondary | ICD-10-CM

## 2023-06-26 DIAGNOSIS — G9341 Metabolic encephalopathy: Secondary | ICD-10-CM

## 2023-06-26 LAB — BLOOD GAS, VENOUS
Acid-Base Excess: 8.8 mmol/L — ABNORMAL HIGH (ref 0.0–2.0)
Bicarbonate: 32.7 mmol/L — ABNORMAL HIGH (ref 20.0–28.0)
O2 Saturation: 82.6 %
Patient temperature: 37
pCO2, Ven: 41 mm[Hg] — ABNORMAL LOW (ref 44–60)
pH, Ven: 7.51 — ABNORMAL HIGH (ref 7.25–7.43)
pO2, Ven: 48 mm[Hg] — ABNORMAL HIGH (ref 32–45)

## 2023-06-26 LAB — GLUCOSE, CAPILLARY
Glucose-Capillary: 156 mg/dL — ABNORMAL HIGH (ref 70–99)
Glucose-Capillary: 51 mg/dL — ABNORMAL LOW (ref 70–99)
Glucose-Capillary: 61 mg/dL — ABNORMAL LOW (ref 70–99)
Glucose-Capillary: 140 mg/dL — ABNORMAL HIGH (ref 70–99)
Glucose-Capillary: 122 mg/dL — ABNORMAL HIGH (ref 70–99)
Glucose-Capillary: 118 mg/dL — ABNORMAL HIGH (ref 70–99)
Glucose-Capillary: 112 mg/dL — ABNORMAL HIGH (ref 70–99)

## 2023-06-26 LAB — TROPONIN I (HIGH SENSITIVITY)
Troponin I (High Sensitivity): 27 ng/L — ABNORMAL HIGH (ref <18)
Troponin I (High Sensitivity): 25 ng/L — ABNORMAL HIGH (ref <18)

## 2023-06-26 LAB — CBC
HCT: 40.2 % (ref 36.0–46.0)
Hemoglobin: 12.6 g/dL (ref 12.0–15.0)
MCH: 34.3 pg — ABNORMAL HIGH (ref 26.0–34.0)
MCHC: 31.3 g/dL (ref 30.0–36.0)
MCV: 109.5 fL — ABNORMAL HIGH (ref 80.0–100.0)
Platelets: 478 10*3/uL — ABNORMAL HIGH (ref 150–400)
RBC: 3.67 MIL/uL — ABNORMAL LOW (ref 3.87–5.11)
RDW: 14.4 % (ref 11.5–15.5)
WBC: 25.4 10*3/uL — ABNORMAL HIGH (ref 4.0–10.5)
nRBC: 2.6 % — ABNORMAL HIGH (ref 0.0–0.2)

## 2023-06-26 LAB — LEGIONELLA PNEUMOPHILA SEROGP 1 UR AG: L. pneumophila Serogp 1 Ur Ag: NEGATIVE

## 2023-06-26 LAB — LACTIC ACID, PLASMA: Lactic Acid, Venous: 3.1 mmol/L (ref 0.5–1.9)

## 2023-06-26 LAB — BASIC METABOLIC PANEL
Anion gap: 15 (ref 5–15)
BUN: 33 mg/dL — ABNORMAL HIGH (ref 8–23)
CO2: 26 mmol/L (ref 22–32)
Calcium: 8.2 mg/dL — ABNORMAL LOW (ref 8.9–10.3)
Chloride: 96 mmol/L — ABNORMAL LOW (ref 98–111)
Creatinine, Ser: 1.05 mg/dL — ABNORMAL HIGH (ref 0.44–1.00)
GFR, Estimated: 55 mL/min — ABNORMAL LOW (ref >60)
Glucose, Bld: 176 mg/dL — ABNORMAL HIGH (ref 70–99)
Potassium: 4 mmol/L (ref 3.5–5.1)
Sodium: 137 mmol/L (ref 135–145)

## 2023-06-26 LAB — BRAIN NATRIURETIC PEPTIDE: B Natriuretic Peptide: 678.7 pg/mL — ABNORMAL HIGH (ref 0.0–100.0)

## 2023-06-26 LAB — PROCALCITONIN: Procalcitonin: 0.14 ng/mL

## 2023-06-26 MED ORDER — SODIUM CHLORIDE 0.9 % IV SOLN
2.0000 g | Freq: Two times a day (BID) | INTRAVENOUS | Status: DC
  Administered 2023-06-26 – 2023-06-28 (×5): 2 g via INTRAVENOUS
  Filled 2023-06-26 (×5): qty 12.5

## 2023-06-26 MED ORDER — LORAZEPAM 2 MG/ML IJ SOLN
1.0000 mg | Freq: Once | INTRAMUSCULAR | Status: AC
  Administered 2023-06-26: 1 mg via INTRAVENOUS
  Filled 2023-06-26: qty 1

## 2023-06-26 MED ORDER — METHYLPREDNISOLONE SODIUM SUCC 40 MG IJ SOLR
40.0000 mg | Freq: Two times a day (BID) | INTRAMUSCULAR | Status: DC
  Administered 2023-06-26 – 2023-06-27 (×3): 40 mg via INTRAVENOUS
  Filled 2023-06-26 (×3): qty 1

## 2023-06-26 MED ORDER — MORPHINE SULFATE (PF) 2 MG/ML IV SOLN
1.0000 mg | Freq: Once | INTRAVENOUS | Status: AC
Start: 2023-06-26 — End: 2023-06-26
  Administered 2023-06-26: 1 mg via INTRAVENOUS
  Filled 2023-06-26: qty 1

## 2023-06-26 MED ORDER — DEXTROSE 50 % IV SOLN
1.0000 | Freq: Once | INTRAVENOUS | Status: AC
  Administered 2023-06-26: 50 mL via INTRAVENOUS
  Filled 2023-06-26: qty 50

## 2023-06-26 MED ORDER — FUROSEMIDE 10 MG/ML IJ SOLN
20.0000 mg | Freq: Once | INTRAMUSCULAR | Status: AC
  Administered 2023-06-26: 20 mg via INTRAVENOUS
  Filled 2023-06-26: qty 2

## 2023-06-26 MED ORDER — BUDESONIDE 0.5 MG/2ML IN SUSP
0.5000 mg | Freq: Two times a day (BID) | RESPIRATORY_TRACT | Status: DC
Start: 2023-06-26 — End: 2023-06-28
  Administered 2023-06-26 – 2023-06-28 (×5): 0.5 mg via RESPIRATORY_TRACT
  Filled 2023-06-26 (×5): qty 2

## 2023-06-26 MED ORDER — IPRATROPIUM-ALBUTEROL 0.5-2.5 (3) MG/3ML IN SOLN
3.0000 mL | RESPIRATORY_TRACT | Status: DC
  Administered 2023-06-26 – 2023-06-27 (×6): 3 mL via RESPIRATORY_TRACT
  Filled 2023-06-26 (×6): qty 3

## 2023-06-26 MED ORDER — HYDROXYZINE HCL 25 MG PO TABS
25.0000 mg | ORAL_TABLET | Freq: Three times a day (TID) | ORAL | Status: DC | PRN
  Administered 2023-06-26 – 2023-06-27 (×2): 25 mg via ORAL
  Filled 2023-06-26 (×2): qty 1

## 2023-06-26 MED ORDER — INSULIN ASPART 100 UNIT/ML IJ SOLN
0.0000 [IU] | Freq: Three times a day (TID) | INTRAMUSCULAR | Status: DC
Start: 2023-06-26 — End: 2023-06-28
  Administered 2023-06-27: 1 [IU] via SUBCUTANEOUS
  Filled 2023-06-26: qty 1

## 2023-06-26 NOTE — Progress Notes (Signed)
Pt very SOB, anxious, BP 156/134, pulse 92, RR 35, SPO 92% on 3L N/C. Schedule and PRN breathing txt given. Pt requesting something for anxiety. Dr. Ashok Pall notified. See new orders

## 2023-06-26 NOTE — Inpatient Diabetes Management (Signed)
Inpatient Diabetes Program Recommendations  AACE/ADA: New Consensus Statement on Inpatient Glycemic Control (2025)  Target Ranges:  Prepandial:   less than 140 mg/dL      Peak postprandial:   less than 180 mg/dL (1-2 hours)      Critically ill patients:  140 - 180 mg/dL   Lab Results  Component Value Date   GLUCAP 140 (H) 06/26/2023   HGBA1C 5.6 06/25/2023    Review of Glycemic Control  Latest Reference Range & Units 06/25/23 21:09 06/26/23 07:26 06/26/23 11:25 06/26/23 11:27 06/26/23 12:24  Glucose-Capillary 70 - 99 mg/dL 161 (H) 096 (H) 51 (L) 61 (L) 140 (H)   Diabetes history: Hyperglycemia  Outpatient Diabetes medications: None Current orders for Inpatient glycemic control:  Novolog 0-9 units tid with meals Novolog 4 units tid with meals   Inpatient Diabetes Program Recommendations:    Consider d/c of Novolog meal coverage 4 units and reduce Novolog correction to very sensitive (0-6 units) tid with meals.   Thanks,  Lorenza Cambridge, RN, BC-ADM Inpatient Diabetes Coordinator Pager [phone]  (8a-5p)

## 2023-06-26 NOTE — Consult Note (Signed)
NAME:  Taylor Chambers, MRN:  [id number], DOB:  04-03-1946, LOS: 2 ADMISSION DATE:  06/24/2023, CONSULTATION DATE:  06/26/2023 REFERRING MD:  Dr. Ashok Pall, CHIEF COMPLAINT:  Acute Respiratory Distress   Brief Pt Description / Synopsis:  78 y.o. female admitted with Acute Hypoxic & Hypercapnic Respiratory Failure in the setting of AECOPD, Pneumonia, and Acute Decompensated HFpEF requiring BiPAP.  Course complicated by Atrial Flutter with RVR.  High risk for intubation.  History of Present Illness:  Taylor Chambers is a 78 y.o. female with medical history significant of COPD, CAD, atrial fibrillation, HFpEF, mitral insufficiency, hyperlipidemia, hypertension, tobacco abuse presenting with acute respiratory failure with hypoxia, acute on chronic HFpEF, COPD exacerbation, pneumonia, sepsis.  Patient reports increased work of breathing over the past 2 to 3 days.  Positive cough wheezing and sputum production.  Minimal malaise.  Mild orthopnea.  No reported fevers or chills.  Previously smoking 1 pack/day.  Patient states she no longer smokes.  No abdominal pain or diarrhea.  Symptoms have progressively worsened over this timeframe.  Baseline atrial fibrillation and CAD followed by [facility] outpatient.  Has been compliant with medication regimen per report.   She was admitted by [facility] for further workup and treatment.  Please see "Significant Hospital Events" section below for full detailed hospital course.   Pertinent  Medical History   Past Medical History:  Diagnosis Date   Allergies    Anxiety    Aortic atherosclerosis (HCC)    Bipolar affective disorder (HCC)    COPD (chronic obstructive pulmonary disease) (HCC)    Coronary artery calcification seen on CT scan    Depression    Diastolic dysfunction 02/24/2020   a.) TTE 02/24/2020: EF >55%; triv PR, mild TR, mod MR; G1DD.   DOE (dyspnea on exertion)    Emphysema lung (HCC)    History of cataract    HLD (hyperlipidemia)    Hypertension    Macular  degeneration    Migraines    Osteoporosis    Pneumonia    Tobacco use     Micro Data:  1/18: SARS-CoV-2/RSV/influenza PCR>> negative 1/18: Strep pneumo urinary antigen>> negative 1/18: Blood culture x 2>> no growth to date 1/19: Respiratory viral panel>> negative 1/19: MRSA PCR>> negative  Antimicrobials:   Anti-infectives (From admission, onward)    Start     Dose/Rate Route Frequency Ordered Stop   06/26/23 1245  ceFEPIme (MAXIPIME) 2 g in sodium chloride 0.9 % 100 mL IVPB        2 g 200 mL/hr over 30 Minutes Intravenous 2 times daily 06/26/23 1148     06/26/23 0500  azithromycin (ZITHROMAX) 500 mg in sodium chloride 0.9 % 250 mL IVPB        500 mg 250 mL/hr over 60 Minutes Intravenous Every 24 hours 06/25/23 0934 06/28/23 0459   06/26/23 0500  cefTRIAXone (ROCEPHIN) 1 g in sodium chloride 0.9 % 100 mL IVPB  Status:  Discontinued        1 g 200 mL/hr over 30 Minutes Intravenous Every 24 hours 06/25/23 0935 06/26/23 0927   06/25/23 0500  cefTRIAXone (ROCEPHIN) 2 g in sodium chloride 0.9 % 100 mL IVPB  Status:  Discontinued        2 g 200 mL/hr over 30 Minutes Intravenous Every 24 hours 06/24/23 0917 06/25/23 0935   06/25/23 0500  azithromycin (ZITHROMAX) 500 mg in sodium chloride 0.9 % 250 mL IVPB  Status:  Discontinued        500 mg  250 mL/hr over 60 Minutes Intravenous Every 24 hours 06/24/23 0917 06/25/23 0934   06/24/23 0630  vancomycin (VANCOCIN) IVPB 1000 mg/200 mL premix        1,000 mg 200 mL/hr over 60 Minutes Intravenous  Once 06/24/23 0620 06/24/23 0743   06/24/23 0630  ceFEPIme (MAXIPIME) 2 g in sodium chloride 0.9 % 100 mL IVPB        2 g 200 mL/hr over 30 Minutes Intravenous  Once 06/24/23 0620 06/24/23 0727       Significant Hospital Events: Including procedures, antibiotic start and stop dates in addition to other pertinent events   1/18: Admitted by [facility] ton BiPAP. 1/19: Weaned off BiPAP.  Cardiology consulted for Atrial Flutter with RVR. 1/20: Pt  confused and with increased WOB.  Placed on BiPAP, PCCM consulted.  Broaden ABX to Cefepime.  Gentle diuresis with 20 mg IV Lasix x1 dose.  Interim History / Subjective:  -As outlined above in Significant Hospital Events Section above  Objective   Blood pressure 134/77, pulse 65, temperature (P) 97.8 F (36.6 C), temperature source (P) Axillary, resp. rate (!) 37, height 5\' 2"  (1.575 m), weight 50.4 kg, SpO2 100%.    FiO2 (%):  [32 %] 32 %   Intake/Output Summary (Last 24 hours) at 06/26/2023 0915 Last data filed at 06/26/2023 0200 Gross per 24 hour  Intake 1013.91 ml  Output 850 ml  Net 163.91 ml   Filed Weights   06/25/23 0832  Weight: 50.4 kg    Examination: General: Acute on chronically ill-appearing female, laying in bed, with moderate respiratory distress on BiPAP HENT: Atraumatic, normocephalic, neck supple, no JVD Lungs: Coarse breath sounds throughout, even, tachypnea, BiPAP assisted Cardiovascular: Regular rate and rhythm, S1-S2, no murmurs, rubs, gallops Abdomen: Soft, nontender, nondistended, no guarding or rebound tenderness, bowel sounds positive x 4 Extremities: Normal bulk and tone, no deformities, no edema, warm and well-perfused Neuro: Lethargic, arouses to pain, currently not following commands, pupils PERRLA GU: Deferred  Resolved Hospital Problem list     Assessment & Plan:   #Acute Hypoxic & Hypercapnic Respiratory Failure #Acute COPD Exacerbation #Community Acquired Pneumonia #Small Pleural Effusions CTa Chest on 1/18 with NO evidence of PE, concern for multifocal infecting and dependent atelectasis and questionable pulmonary edema, moderate emphysema. -Supplemental O2 as needed to maintain O2 sats 88 to 92% -BiPAP, wean as tolerated ~high risk for intubation -Follow intermittent Chest X-ray & ABG as needed -Bronchodilators & Pulmicort nebs -IV Steroids -ABX as above -Diuresis as BP and renal function permits ~ will give 20 mg IV Lasix x1 dose  1/20 given respiratory distress and small bump in creatinine today -Pulmonary toilet as able  #Acute Decompensated HFpEF #Atrial Flutter with RVR ~ CONVERTED TO NSR  #Mildly Elevated Troponin, suspect demand ischemia #Moderate Mitral Regurgitation PMHx: Nonobstructive CAD, HTN Echocardiogram (TEE) 04/20/23: LVEF 60-65%, RV systolic function is normal, RV size is normal, moderate MR, mild atheroma plaque of the aortic aorch and descending aorta, NO atrial appendage thrombus -Continuous cardiac monitoring -Maintain MAP >65 -Vasopressors as needed to maintain MAP goal ~ not requiring -Trend lactic acid until normalized -HS Troponin peaked at 32 -BNP is 678 on 1/20 -Diuresis as BP and renal function permits ~ will give 20 mg IV Lasix x1 dose on 1/20 -Cardiology following, appreciate input -Continue Amiodarone and Eliquis as per Cardiology  #Concern for Pneumonia -Monitor fever curve -Trend WBC's & Procalcitonin -Follow cultures as above -Given decline in respiratory status and worsening Leukocytosis, will broaden  to  empiric Azithromycin and Cefepime for now pending cultures & sensitivities  #Acute Metabolic Encephalopathy #Concern for CO2 Narcosis -Treatment of metabolic derangements as outlined above -Provide supportive care -Promote normal sleep/wake cycle and family presence -Avoid sedating medications as able     Patient is critically ill, currently on BiPAP with increased work of breathing.  High risk for further decompensation necessitating intubation and mechanical ventilation, along with cardiac arrest and death.   Best Practice (right click and "Reselect all SmartList Selections" daily)   Diet/type: NPO DVT prophylaxis: DOAC GI prophylaxis: N/A Lines: N/A Foley:  N/A Code Status:  full code Last date of multidisciplinary goals of care discussion [1/20]  1/20: Pt's husband updated at bedside on plan of care.  He is ok with intubation if needed.  Labs    CBC: Recent Labs  Lab 06/22/23 0944 06/24/23 0534 06/25/23 0436  WBC 10.5 18.5* 21.0*  NEUTROABS  --  14.0*  --   HGB 11.7* 12.5 11.4*  HCT 36.0 39.9 34.2*  MCV 105.9* 108.7* 102.7*  PLT 339 444* 385    Basic Metabolic Panel: Recent Labs  Lab 06/22/23 0944 06/24/23 0534 06/25/23 0435 06/25/23 0436 06/25/23 1409 06/26/23 0243  NA 145 142  --  138  --  137  K 4.0 4.0  --  2.9* 4.0 4.0  CL 107 103  --  98  --  96*  CO2 25 24  --  26  --  26  GLUCOSE 125* 349*  --  106*  --  176*  BUN 23 29*  --  22  --  33*  CREATININE 0.89 0.87  --  0.67  --  1.05*  CALCIUM 9.2 8.6*  --  8.0*  --  8.2*  MG  --   --  2.2  --   --   --    GFR: Estimated Creatinine Clearance: 36.1 mL/min (A) (by C-G formula based on SCr of 1.05 mg/dL (H)). Recent Labs  Lab 06/22/23 0944 06/24/23 0534 06/24/23 0723 06/25/23 0436 06/26/23 0243  PROCALCITON  --  <0.10  --   --   --   WBC 10.5 18.5*  --  21.0*  --   LATICACIDVEN  --  4.6* 3.2*  --  3.1*    Liver Function Tests: Recent Labs  Lab 06/22/23 0944 06/24/23 0534 06/25/23 0436  AST 72* 97* 42*  ALT 57* 86* 61*  ALKPHOS 63 68 63  BILITOT 0.9 0.8 1.0  PROT 6.3* 6.4* 6.0*  ALBUMIN 3.4* 3.4* 3.4*   No results for input(s): "LIPASE", "AMYLASE" in the last 168 hours. No results for input(s): "AMMONIA" in the last 168 hours.  ABG    Component Value Date/Time   HCO3 26.1 06/24/2023 0534   TCO2 27 11/10/2021 0757   ACIDBASEDEF 3.8 (H) 06/24/2023 0534   O2SAT 55.4 06/24/2023 0534     Coagulation Profile: Recent Labs  Lab 06/24/23 0534  INR 1.4*    Cardiac Enzymes: No results for input(s): "CKTOTAL", "CKMB", "CKMBINDEX", "TROPONINI" in the last 168 hours.  HbA1C: Hgb A1c MFr Bld  Date/Time Value Ref Range Status  06/25/2023 04:36 AM 5.6 4.8 - 5.6 % Final    Comment:    (NOTE) Pre diabetes:          5.7%-6.4%  Diabetes:              >6.4%  Glycemic control for   <7.0% adults with diabetes   09/05/2022 03:11 PM 5.4  4.8 -  5.6 % Final    Comment:    (NOTE)         Prediabetes: 5.7 - 6.4         Diabetes: >6.4         Glycemic control for adults with diabetes: <7.0     CBG: Recent Labs  Lab 06/25/23 0833 06/25/23 1134 06/25/23 1616 06/25/23 2109 06/26/23 0726  GLUCAP 174* 200* 96 140* 156*    Review of Systems:   Unable to assess due to AMS/Respiratory Distress   Past Medical History:  She,  has a past medical history of Allergies, Anxiety, Aortic atherosclerosis (HCC), Bipolar affective disorder (HCC), COPD (chronic obstructive pulmonary disease) (HCC), Coronary artery calcification seen on CT scan, Depression, Diastolic dysfunction (02/24/2020), DOE (dyspnea on exertion), Emphysema lung (HCC), History of cataract, HLD (hyperlipidemia), Hypertension, Macular degeneration, Migraines, Osteoporosis, Pneumonia, and Tobacco use.   Surgical History:   Past Surgical History:  Procedure Laterality Date   CATARACT EXTRACTION     COLONOSCOPY N/A 08/30/2021   Procedure: COLONOSCOPY;  Surgeon: Jaynie Collins, DO;  Location: [facility] ENDOSCOPY;  Service: Gastroenterology;  Laterality: N/A;   DILATION AND CURETTAGE OF UTERUS     TEE WITHOUT CARDIOVERSION N/A 04/20/2023   Procedure: TRANSESOPHAGEAL ECHOCARDIOGRAM;  Surgeon: Antonieta Iba, MD;  Location: [facility] ORS;  Service: Cardiovascular;  Laterality: N/A;   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   Procedure: TOTAL HIP ARTHROPLASTY;  Surgeon: Donato Heinz, MD;  Location: [facility] ORS;  Service: Orthopedics;  Laterality: Right;   WISDOM TOOTH EXTRACTION     WRIST GANGLION EXCISION       Social History:   reports that she quit smoking about 17 months ago. Her smoking use included cigarettes. She started smoking about 51 years ago. She has a 12.5 pack-year smoking history. She has never used smokeless tobacco. She reports that she does not drink alcohol and does not use drugs.   Family History:  Her family history includes Breast cancer (age of  onset: 53) in her mother; Heart Problems in her maternal uncle, paternal aunt, and paternal uncle.   Allergies Allergies  Allergen Reactions   Bupropion Other (See Comments)    Foggy brain     Home Medications  Prior to Admission medications   Medication Sig Start Date End Date Taking? Authorizing Provider  albuterol (VENTOLIN HFA) 108 (90 Base) MCG/ACT inhaler Inhale 2 puffs into the lungs every 6 (six) hours as needed for wheezing or shortness of breath. 06/22/23  Yes Claybon Jabs, MD  apixaban (ELIQUIS) 5 MG TABS tablet Take 1 tablet (5 mg total) by mouth 2 (two) times daily. 12/13/22  Yes Iran Ouch, MD  Calcium Carbonate (CALCIUM 500 PO) Take 500 mg by mouth daily at 12 noon.   Yes [provider]  Cholecalciferol (VITAMIN D3) 1.25 MG (50000 UT) CAPS Take 5,000 Units by mouth daily in the afternoon.   Yes [provider]  doxycycline (VIBRA-TABS) 100 MG tablet Take 1 tablet (100 mg total) by mouth 2 (two) times daily for 10 days. 06/22/23 07/02/23 Yes Claybon Jabs, MD  Dupilumab 300 MG/2ML SOAJ Inject 300 mg into the skin every 14 (fourteen) days. Saturday 05/26/23  Yes [provider]  EPINEPHrine 0.3 mg/0.3 mL IJ SOAJ injection Inject 0.3 mg into the muscle as needed for anaphylaxis. 05/26/23  Yes [provider]  ibuprofen (ADVIL) 200 MG tablet Take 400 mg by mouth every 6 (six) hours as needed for headache.   Yes  [provider]  Ipratropium-Albuterol (COMBIVENT RESPIMAT) 20-100 MCG/ACT AERS respimat Inhale 1 puff into the lungs 2 (two) times daily.   Yes [provider]  ipratropium-albuterol (DUONEB) 0.5-2.5 (3) MG/3ML SOLN Take 3 mLs by nebulization 2 (two) times daily. Mix with budesonide   Yes [provider]  losartan (COZAAR) 100 MG tablet Take 100 mg by mouth daily.   Yes [provider]  metoprolol succinate (TOPROL-XL) 50 MG 24 hr tablet Take 1 tablet (50 mg total) by mouth daily. Take with or  immediately following a meal. 01/06/23 06/24/23 Yes Iran Ouch, MD  Multiple Vitamins-Minerals (PRESERVISION AREDS 2+MULTI VIT PO) Take 2 tablets by mouth daily.   Yes [provider]  omeprazole (PRILOSEC) 40 MG capsule Take 40 mg by mouth daily as needed (Heartburn). 03/31/22 06/24/23 Yes [provider]  pravastatin (PRAVACHOL) 10 MG tablet Take 10 mg by mouth daily.   Yes [provider]  predniSONE (DELTASONE) 20 MG tablet Take 2 tablets (40 mg total) by mouth daily with breakfast for 4 days. 06/23/23 06/27/23 Yes Claybon Jabs, MD  sertraline (ZOLOFT) 100 MG tablet Take 100 mg by mouth daily. 06/29/22 06/29/23 Yes [provider]  sulfamethoxazole-trimethoprim (BACTRIM) 400-80 MG tablet Take 1 tablet by mouth 3 (three) times a week. Monday, Wednesday and Friday 03/06/23  Yes [provider]  OHTUVAYRE 3 MG/2.5ML SUSP Take 3 mg by nebulization daily. 05/31/23   [provider]     Critical care time: 60 minutes     Harlon Ditty, AGACNP-BC [facility] Prefer epic messenger for cross cover needs If after hours, please call E-link

## 2023-06-26 NOTE — Progress Notes (Signed)
OT Cancellation Note  Patient Details Name: Taylor Chambers MRN: [id number] DOB: August 02, 1945   Cancelled Treatment:    Reason Eval/Treat Not Completed: Patient not medically ready. Chart reviewed. On arrival RR 31 with pt reporting feeling nausea. RN and family at bed side requesting hold OT evaluation this AM, will initiate services as able.   Kathie Dike, M.S. OTR/L  06/26/23, 8:42 AM  ascom [phone]

## 2023-06-26 NOTE — Progress Notes (Signed)
PROGRESS NOTE    [place] Chambers  UUV:[id number] DOB: February 06, 1946 DOA: 06/24/2023 PCP: Mick Sell, MD  Outpatient Specialists: cardiology, pulmonology    Brief Narrative:   From admission h and p Haydon Bragan is a 78 y.o. female with medical history significant of COPD, CAD, atrial fibrillation, HFpEF, mitral insufficiency, hyperlipidemia, hypertension, tobacco abuse presenting with acute respiratory failure with hypoxia, acute on chronic HFpEF, COPD exacerbation, pneumonia, sepsis.  Patient reports increased work of breathing over the past 2 to 3 days.  Positive cough wheezing and sputum production.  Minimal malaise.  Mild orthopnea.  No reported fevers or chills.  Previously smoking 1 pack/day.  Patient states she no longer smokes.  No abdominal pain or diarrhea.  Symptoms have progressively worsened over this timeframe.  Baseline atrial fibrillation and CAD followed by [facility] outpatient.  Has been compliant with medication regimen per report.    Assessment & Plan:   Principal Problem:   Acute respiratory failure with hypoxia (HCC) Active Problems:   Sepsis (HCC)   Tobacco use disorder   Benign essential hypertension   Malnutrition of moderate degree   Paroxysmal atrial fibrillation with RVR (HCC)   Mitral valve insufficiency   Hyperglycemia   COPD (chronic obstructive pulmonary disease) (HCC)   # Acute hypercarbic respiratory failure on chronic hypoxic respiratory failure Altered and hypercarbic on arrival requiring bipap, yesterday weaned off and stable on home 2 liters. This morning however dyspneic and confused, I'm concerned again for hypercarbic respiratory failure - resume bipap - vbg, trop, bnp - cxr  # A-fib with RVR Reports several weeks fatigue, exertional dyspnea, most likely mediated by rvr. Didn't resolve with oral and iv BB. Not hyperthyroid - cardiology consulted - cont apixaban - continue amio gtt  # HFpEF with acute exacerbation # Moderate mitral  regurg Recent TEE with preserved EF, moderate MR. Likely mediated by a-fib. No chest pain and mild stable trop elevation, doubt acs - pause lasix given rise in cr and bun - cardiology following  # Pleural effusions Appear to be new, suspect 2/2 a-fib - consider thoracentesis if fails to respond to bipap  # COPD With acute exacerbation, though suspect a-fib/chf to be playing a greater role. Covid/flu/rsv/rvp neg. No fever and normal procal so think less likely underlying bacterial pneumonia, though CTA (no PE) does show dependent lung opacities - sputum for culture (not producing any currently.  - continue steroids (IV for now) - will continue ceftriaxone and azithromycin for now - scheduled duonebs  # Hypokalemia resolved - monitor  # HTN Soft BPs in setting of above - hold home antihypertensives  # GAD - home sertraline      DVT prophylaxis: therapeutic apixaban Code Status: full Family Communication: husband updated @ bedside 1/19  Level of care: Stepdown Status is: Inpatient Remains inpatient appropriate because: severity of illness    Consultants:  cardiology  Procedures: none    Subjective: Confused and tachypneic at  Objective: Vitals:   06/26/23 0730 06/26/23 0800 06/26/23 0830 06/26/23 0846  BP: (!) 169/117 (!) 146/126 (!) 182/102 130/70  Pulse: 87 81 78 86  Resp: (!) 24 (!) 28 (!) 42 (!) 32  Temp:      TempSrc:      SpO2: 96% 96% 98% 99%  Weight:      Height:        Intake/Output Summary (Last 24 hours) at 06/26/2023 0851 Last data filed at 06/26/2023 0200 Gross per 24 hour  Intake 1013.91 ml  Output 850  ml  Net 163.91 ml   Filed Weights   06/25/23 0832  Weight: 50.4 kg    Examination:  General exam: Appears calm and comfortable  Respiratory system: decreased at bases, scattered rhonchi Cardiovascular system: tachycardic, systolic murmur Gastrointestinal system: Abdomen is nondistended, soft and nontender. No organomegaly or  masses felt. Normal bowel sounds heard. Central nervous system: Alert and oriented. No focal neurological deficits. Extremities: Symmetric 5 x 5 power. No edema Skin: No rashes, lesions or ulcers Psychiatry: Judgement and insight appear normal. Mood & affect appropriate.     Data Reviewed: I have personally reviewed following labs and imaging studies  CBC: Recent Labs  Lab 06/22/23 0944 06/24/23 0534 06/25/23 0436  WBC 10.5 18.5* 21.0*  NEUTROABS  --  14.0*  --   HGB 11.7* 12.5 11.4*  HCT 36.0 39.9 34.2*  MCV 105.9* 108.7* 102.7*  PLT 339 444* 385   Basic Metabolic Panel: Recent Labs  Lab 06/22/23 0944 06/24/23 0534 06/25/23 0435 06/25/23 0436 06/25/23 1409 06/26/23 0243  NA 145 142  --  138  --  137  K 4.0 4.0  --  2.9* 4.0 4.0  CL 107 103  --  98  --  96*  CO2 25 24  --  26  --  26  GLUCOSE 125* 349*  --  106*  --  176*  BUN 23 29*  --  22  --  33*  CREATININE 0.89 0.87  --  0.67  --  1.05*  CALCIUM 9.2 8.6*  --  8.0*  --  8.2*  MG  --   --  2.2  --   --   --    GFR: Estimated Creatinine Clearance: 36.1 mL/min (A) (by C-G formula based on SCr of 1.05 mg/dL (H)). Liver Function Tests: Recent Labs  Lab 06/22/23 0944 06/24/23 0534 06/25/23 0436  AST 72* 97* 42*  ALT 57* 86* 61*  ALKPHOS 63 68 63  BILITOT 0.9 0.8 1.0  PROT 6.3* 6.4* 6.0*  ALBUMIN 3.4* 3.4* 3.4*   No results for input(s): "LIPASE", "AMYLASE" in the last 168 hours. No results for input(s): "AMMONIA" in the last 168 hours. Coagulation Profile: Recent Labs  Lab 06/24/23 0534  INR 1.4*   Cardiac Enzymes: No results for input(s): "CKTOTAL", "CKMB", "CKMBINDEX", "TROPONINI" in the last 168 hours. BNP (last 3 results) No results for input(s): "PROBNP" in the last 8760 hours. HbA1C: Recent Labs    06/25/23 0436  HGBA1C 5.6   CBG: Recent Labs  Lab 06/25/23 0833 06/25/23 1134 06/25/23 1616 06/25/23 2109 06/26/23 0726  GLUCAP 174* 200* 96 140* 156*   Lipid Profile: No results  for input(s): "CHOL", "HDL", "LDLCALC", "TRIG", "CHOLHDL", "LDLDIRECT" in the last 72 hours. Thyroid Function Tests: Recent Labs    06/24/23 0534  TSH 7.817*   Anemia Panel: No results for input(s): "VITAMINB12", "FOLATE", "FERRITIN", "TIBC", "IRON", "RETICCTPCT" in the last 72 hours. Urine analysis:    Component Value Date/Time   COLORURINE YELLOW (A) 10/28/2021 1425   APPEARANCEUR HAZY (A) 10/28/2021 1425   LABSPEC 1.026 10/28/2021 1425   PHURINE 5.0 10/28/2021 1425   GLUCOSEU NEGATIVE 10/28/2021 1425   HGBUR NEGATIVE 10/28/2021 1425   BILIRUBINUR NEGATIVE 10/28/2021 1425   KETONESUR 5 (A) 10/28/2021 1425   PROTEINUR 30 (A) 10/28/2021 1425   NITRITE NEGATIVE 10/28/2021 1425   LEUKOCYTESUR NEGATIVE 10/28/2021 1425   Sepsis Labs: @LABRCNTIP (procalcitonin:4,lacticidven:4)  ) Recent Results (from the past 240 hours)  Blood culture (routine x 2)  Status: None (Preliminary result)   Collection Time: 06/24/23  5:34 AM   Specimen: BLOOD  Result Value Ref Range Status   Specimen Description BLOOD BLOOD RIGHT ARM  Final   Special Requests   Final    BOTTLES DRAWN AEROBIC AND ANAEROBIC Blood Culture results may not be optimal due to an inadequate volume of blood received in culture bottles   Culture   Final    NO GROWTH 1 DAY Performed at [facility], [address], [place], [place] [address]    Report Status PENDING  Incomplete  Blood culture (routine x 2)     Status: None (Preliminary result)   Collection Time: 06/24/23  5:43 AM   Specimen: BLOOD  Result Value Ref Range Status   Specimen Description BLOOD BLOOD RIGHT FOREARM  Final   Special Requests   Final    BOTTLES DRAWN AEROBIC ONLY Blood Culture results may not be optimal due to an inadequate volume of blood received in culture bottles   Culture   Final    NO GROWTH 1 DAY Performed at [facility], [address], [place], [place] [address]    Report Status PENDING  Incomplete  Resp panel  by RT-PCR (RSV, Flu A&B, Covid) Anterior Nasal Swab     Status: None   Collection Time: 06/24/23  5:43 AM   Specimen: Anterior Nasal Swab  Result Value Ref Range Status   SARS Coronavirus 2 by RT PCR NEGATIVE NEGATIVE Final    Comment: (NOTE) SARS-CoV-2 target nucleic acids are NOT DETECTED.  The SARS-CoV-2 RNA is generally detectable in upper respiratory specimens during the acute phase of infection. The lowest concentration of SARS-CoV-2 viral copies this assay can detect is 138 copies/mL. A negative result does not preclude SARS-Cov-2 infection and should not be used as the sole basis for treatment or other patient management decisions. A negative result may occur with  improper specimen collection/handling, submission of specimen other than nasopharyngeal swab, presence of viral mutation(s) within the areas targeted by this assay, and inadequate number of viral copies(<138 copies/mL). A negative result must be combined with clinical observations, patient history, and epidemiological information. The expected result is Negative.  Fact Sheet for Patients:  BloggerCourse.com  Fact Sheet for Healthcare Providers:  SeriousBroker.it  This test is no t yet approved or cleared by the [place] FDA and  has been authorized for detection and/or diagnosis of SARS-CoV-2 by FDA under an Emergency Use Authorization (EUA). This EUA will remain  in effect (meaning this test can be used) for the duration of the COVID-19 declaration under Section 564(b)(1) of the Act, 21 U.S.C.section 360bbb-3(b)(1), unless the authorization is terminated  or revoked sooner.       Influenza A by PCR NEGATIVE NEGATIVE Final   Influenza B by PCR NEGATIVE NEGATIVE Final    Comment: (NOTE) The Xpert Xpress SARS-CoV-2/FLU/RSV plus assay is intended as an aid in the diagnosis of influenza from Nasopharyngeal swab specimens and should not be used as a sole  basis for treatment. Nasal washings and aspirates are unacceptable for Xpert Xpress SARS-CoV-2/FLU/RSV testing.  Fact Sheet for Patients: BloggerCourse.com  Fact Sheet for Healthcare Providers: SeriousBroker.it  This test is not yet approved or cleared by the [place] FDA and has been authorized for detection and/or diagnosis of SARS-CoV-2 by FDA under an Emergency Use Authorization (EUA). This EUA will remain in effect (meaning this test can be used) for the duration of the COVID-19 declaration under Section 564(b)(1)  of the Act, 21 U.S.C. section 360bbb-3(b)(1), unless the authorization is terminated or revoked.     Resp Syncytial Virus by PCR NEGATIVE NEGATIVE Final    Comment: (NOTE) Fact Sheet for Patients: BloggerCourse.com  Fact Sheet for Healthcare Providers: SeriousBroker.it  This test is not yet approved or cleared by the [place] FDA and has been authorized for detection and/or diagnosis of SARS-CoV-2 by FDA under an Emergency Use Authorization (EUA). This EUA will remain in effect (meaning this test can be used) for the duration of the COVID-19 declaration under Section 564(b)(1) of the Act, 21 U.S.C. section 360bbb-3(b)(1), unless the authorization is terminated or revoked.  Performed at [facility], [address] [place], [place] [address]   MRSA Next Gen by PCR, Nasal     Status: None   Collection Time: 06/25/23  8:34 AM   Specimen: Nasal Mucosa; Nasal Swab  Result Value Ref Range Status   MRSA by PCR Next Gen NOT DETECTED NOT DETECTED Final    Comment: (NOTE) The GeneXpert MRSA Assay (FDA approved for NASAL specimens only), is one component of a comprehensive MRSA colonization surveillance program. It is not intended to diagnose MRSA infection nor to guide or monitor treatment for MRSA infections. Test performance is not FDA approved  in patients less than 20 years old. Performed at [facility], [address] [place], [place] [address]   Respiratory (~20 pathogens) panel by PCR     Status: None   Collection Time: 06/25/23  9:24 AM   Specimen: Nasopharyngeal Swab; Respiratory  Result Value Ref Range Status   Adenovirus NOT DETECTED NOT DETECTED Final   Coronavirus 229E NOT DETECTED NOT DETECTED Final    Comment: (NOTE) The Coronavirus on the Respiratory Panel, DOES NOT test for the novel  Coronavirus (2019 nCoV)    Coronavirus HKU1 NOT DETECTED NOT DETECTED Final   Coronavirus NL63 NOT DETECTED NOT DETECTED Final   Coronavirus OC43 NOT DETECTED NOT DETECTED Final   Metapneumovirus NOT DETECTED NOT DETECTED Final   Rhinovirus / Enterovirus NOT DETECTED NOT DETECTED Final   Influenza A NOT DETECTED NOT DETECTED Final   Influenza B NOT DETECTED NOT DETECTED Final   Parainfluenza Virus 1 NOT DETECTED NOT DETECTED Final   Parainfluenza Virus 2 NOT DETECTED NOT DETECTED Final   Parainfluenza Virus 3 NOT DETECTED NOT DETECTED Final   Parainfluenza Virus 4 NOT DETECTED NOT DETECTED Final   Respiratory Syncytial Virus NOT DETECTED NOT DETECTED Final   Bordetella pertussis NOT DETECTED NOT DETECTED Final   Bordetella Parapertussis NOT DETECTED NOT DETECTED Final   Chlamydophila pneumoniae NOT DETECTED NOT DETECTED Final   Mycoplasma pneumoniae NOT DETECTED NOT DETECTED Final    Comment: Performed at [facility], [address], [place], [place] [address]         Radiology Studies: CT Angio Chest Pulmonary Embolism (PE) W or WO Contrast Result Date: 06/24/2023 CLINICAL DATA:  Positive D-dimer.  Respiratory distress. EXAM: CT ANGIOGRAPHY CHEST WITH CONTRAST TECHNIQUE: Multidetector CT imaging of the chest was performed using the standard protocol during bolus administration of intravenous contrast. Multiplanar CT image reconstructions and MIPs were obtained to evaluate the vascular anatomy. RADIATION  DOSE REDUCTION: This exam was performed according to the departmental dose-optimization program which includes automated exposure control, adjustment of the mA and/or kV according to patient size and/or use of iterative reconstruction technique. CONTRAST:  75mL OMNIPAQUE IOHEXOL 350 MG/ML SOLN COMPARISON:  Chest CT, 01/12/2023, and older exams. FINDINGS: Cardiovascular:  Pulmonary arteries are well opacified. There is no evidence of a pulmonary embolism. Heart normal in size configuration. Circumflex coronary artery calcifications. No pericardial effusion. Great vessels are normal in caliber. No aortic dissection. Aortic atherosclerosis. Arch branch vessels are widely patent. Mediastinum/Nodes: No neck base, mediastinal or hilar masses. No enlarged lymph nodes. Trachea and esophagus are unremarkable. Lungs/Pleura: Moderate emphysema. Mild chronic apical pleuroparenchymal scarring. Dependent opacity in the left upper lobe, minimally in the right upper lobe. Ground-glass type opacity in the medial right lower lobe. Dependent opacity noted in the lower lobes, greater on the left with associated bronchial wall and interstitial thickening. Moderate right and small left pleural effusions. No pneumothorax. Upper Abdomen: No acute findings. Musculoskeletal: No fracture or acute finding. No bone lesion. No chest wall mass. Review of the MIP images confirms the above findings. IMPRESSION: 1. No evidence of a pulmonary embolism. 2. Dependent lung opacities as detailed associated with moderate right and small left pleural effusions. Suspect a combination of multifocal infection and dependent atelectasis. Consider a component pulmonary edema in the proper clinical setting. 3. Moderate emphysema. Aortic Atherosclerosis (ICD10-I70.0) and Emphysema (ICD10-J43.9). Electronically Signed   By: Amie Portland M.D.   On: 06/24/2023 10:17        Scheduled Meds:  apixaban  5 mg Oral BID   Chlorhexidine Gluconate Cloth  6 each  Topical Daily   feeding supplement  237 mL Oral BID BM   insulin aspart  0-9 Units Subcutaneous TID WC   insulin aspart  4 Units Subcutaneous TID WC   ipratropium  0.5 mg Nebulization BID   metoprolol tartrate  12.5 mg Oral Q6H   predniSONE  40 mg Oral Q breakfast   sertraline  100 mg Oral Daily   Continuous Infusions:  amiodarone 30 mg/hr (06/26/23 0759)   azithromycin 500 mg (06/26/23 1914)   cefTRIAXone (ROCEPHIN)  IV 1 g (06/26/23 0602)     LOS: 2 days     Silvano Bilis, MD [facility]   If 7PM-7AM, please contact night-coverage [email or web address] Password [facility] 06/26/2023, 8:51 AM

## 2023-06-26 NOTE — Progress Notes (Addendum)
Pt still very tachypneic RR 38-45's, accessory muscle use, restless. Pt having hallucinations.Did not respond to breathing txt. Dr. Ashok Pall in route to assess pt.will  continue to monitor.

## 2023-06-26 NOTE — Plan of Care (Signed)
  Problem: Education: Goal: Ability to describe self-care measures that may prevent or decrease complications (Diabetes Survival Skills Education) will improve Outcome: Progressing   Problem: Tissue Perfusion: Goal: Adequacy of tissue perfusion will improve Outcome: Progressing   Problem: Pain Managment: Goal: General experience of comfort will improve and/or be controlled Outcome: Progressing   Problem: Safety: Goal: Ability to remain free from injury will improve Outcome: Progressing

## 2023-06-26 NOTE — Progress Notes (Signed)
PT Cancellation Note  Patient Details Name: [place] Chambers MRN: [id number] DOB: 20-Nov-1945   Cancelled Treatment:    Reason Eval/Treat Not Completed: Patient not medically ready PT orders received, chart reviewed. Pt noted to have increased RR & transitioned back to bipap. Pt not appropriate for exertional activity/PT evaluation at this time. Will f/u as able.  Aleda Grana, PT, DPT 06/26/23, 11:27 AM  Sandi Mariscal 06/26/2023, 11:27 AM

## 2023-06-26 NOTE — Plan of Care (Signed)
  Problem: Coping: Goal: Ability to adjust to condition or change in health will improve Outcome: Progressing   Problem: Metabolic: Goal: Ability to maintain appropriate glucose levels will improve Outcome: Progressing   Problem: Nutritional: Goal: Maintenance of adequate nutrition will improve Outcome: Progressing   Problem: Skin Integrity: Goal: Risk for impaired skin integrity will decrease Outcome: Progressing   Problem: Activity: Goal: Risk for activity intolerance will decrease Outcome: Progressing

## 2023-06-26 NOTE — Progress Notes (Signed)
Rounding Note    Patient Name: Taylor Chambers Date of Encounter: 06/26/2023  [facility] Cardiologist: Lorine Bears, MD   Subjective   Patient's son in the room. Patient is currently asleep on bipap. Sons reports condition seems to be worsening, noted confusion over the last 24 hours.   She converted to NSR 1/19 in the afternoon. Remains in SB  Inpatient Medications    Scheduled Meds:  apixaban  5 mg Oral BID   budesonide (PULMICORT) nebulizer solution  0.5 mg Nebulization BID   Chlorhexidine Gluconate Cloth  6 each Topical Daily   feeding supplement  237 mL Oral BID BM   insulin aspart  0-9 Units Subcutaneous TID WC   insulin aspart  4 Units Subcutaneous TID WC   ipratropium-albuterol  3 mL Nebulization Q4H   methylPREDNISolone (SOLU-MEDROL) injection  40 mg Intravenous Q12H   metoprolol tartrate  12.5 mg Oral Q6H   sertraline  100 mg Oral Daily   Continuous Infusions:  amiodarone 30 mg/hr (06/26/23 0759)   azithromycin 500 mg (06/26/23 0634)   PRN Meds: hydrOXYzine, levalbuterol, ondansetron **OR** ondansetron (ZOFRAN) IV   Vital Signs    Vitals:   06/26/23 0900 06/26/23 0910 06/26/23 1000 06/26/23 1126  BP: 134/77  119/64   Pulse: 65 65 (!) 56 (!) 56  Resp: (!) 37 (!) 31 (!) 33 (!) 29  Temp:      TempSrc:      SpO2: 100% 100% 100% 100%  Weight:      Height:        Intake/Output Summary (Last 24 hours) at 06/26/2023 1140 Last data filed at 06/26/2023 0700 Gross per 24 hour  Intake 369.25 ml  Output 850 ml  Net -480.75 ml      06/25/2023    8:32 AM 06/22/2023    9:38 AM 05/16/2023   10:09 AM  Last 3 Weights  Weight (lbs) 111 lb 1.8 oz 109 lb 110 lb 6.4 oz  Weight (kg) 50.4 kg 49.442 kg 50.077 kg      Telemetry    Afib to NSR HR 50s - Personally Reviewed  ECG    No new - Personally Reviewed  Physical Exam   GEN: asleep with Bipap  Neck: No JVD Cardiac: RRR, no murmurs, rubs, or gallops.  Respiratory: course breath sounds GI:  Soft, nontender, non-distended  MS: No edema; No deformity. Neuro:  Nonfocal  Psych: Normal affect   Labs    High Sensitivity Troponin:   Recent Labs  Lab 06/22/23 0944 06/22/23 1323 06/24/23 0534 06/24/23 0723 06/26/23 0900  TROPONINIHS 36* 25* 31* 32* 27*     Chemistry Recent Labs  Lab 06/22/23 0944 06/24/23 0534 06/25/23 0435 06/25/23 0436 06/25/23 1409 06/26/23 0243  NA 145 142  --  138  --  137  K 4.0 4.0  --  2.9* 4.0 4.0  CL 107 103  --  98  --  96*  CO2 25 24  --  26  --  26  GLUCOSE 125* 349*  --  106*  --  176*  BUN 23 29*  --  22  --  33*  CREATININE 0.89 0.87  --  0.67  --  1.05*  CALCIUM 9.2 8.6*  --  8.0*  --  8.2*  MG  --   --  2.2  --   --   --   PROT 6.3* 6.4*  --  6.0*  --   --   ALBUMIN 3.4* 3.4*  --  3.4*  --   --   AST 72* 97*  --  42*  --   --   ALT 57* 86*  --  61*  --   --   ALKPHOS 63 68  --  63  --   --   BILITOT 0.9 0.8  --  1.0  --   --   GFRNONAA >60 >60  --  >60  --  55*  ANIONGAP 13 15  --  14  --  15    Lipids No results for input(s): "CHOL", "TRIG", "HDL", "LABVLDL", "LDLCALC", "CHOLHDL" in the last 168 hours.  Hematology Recent Labs  Lab 06/24/23 0534 06/25/23 0436 06/26/23 0900  WBC 18.5* 21.0* 25.4*  RBC 3.67* 3.33* 3.67*  HGB 12.5 11.4* 12.6  HCT 39.9 34.2* 40.2  MCV 108.7* 102.7* 109.5*  MCH 34.1* 34.2* 34.3*  MCHC 31.3 33.3 31.3  RDW 14.1 13.9 14.4  PLT 444* 385 478*   Thyroid  Recent Labs  Lab 06/24/23 0534  TSH 7.817*    BNP Recent Labs  Lab 06/22/23 0944 06/24/23 0534 06/26/23 0900  BNP 1,169.8* 1,629.4* 678.7*    DDimer  Recent Labs  Lab 06/24/23 0743  DDIMER 1.63*     Radiology    DG Chest Port 1 View Result Date: 06/26/2023 CLINICAL DATA:  Dyspnea.  History of COPD. EXAM: PORTABLE CHEST 1 VIEW COMPARISON:  Chest radiograph and CTA chest dated June 24, 2023. FINDINGS: Stable cardiomediastinal silhouette. Aortic atherosclerosis. Hyperinflation with emphysematous changes, compatible with  COPD. Persistent small bilateral pleural effusions with basilar atelectasis. Similar left-sided basilar predominant interstitial changes. No pneumothorax. The visualized osseous structures are unchanged. IMPRESSION: 1. Persistent small bilateral pleural effusions with basilar atelectasis. 2. Similar left basilar predominant interstitial opacities, could reflect a combination of infiltrate, asymmetric edema, and/or atelectasis. Electronically Signed   By: Hart Robinsons M.D.   On: 06/26/2023 09:23    Cardiac Studies   TEE 04/20/2023: 1. Left ventricular ejection fraction, by estimation, is 60 to 65%. The  left ventricle has normal function. The left ventricle has no regional  wall motion abnormalities.   2. Right ventricular systolic function is normal. The right ventricular  size is normal.   3. No left atrial/left atrial appendage thrombus was detected.   4. The mitral valve is normal in structure. Moderate mitral valve  regurgitation. No evidence of mitral stenosis. No significant valve  prolapse noted.   5. The aortic valve is normal in structure. Aortic valve regurgitation is  not visualized. No aortic stenosis is present.   6. There is mild (Grade II) atheroma plaque involving the aortic arch and  descending aorta.   7. The inferior vena cava is normal in size with greater than 50%  respiratory variability, suggesting right atrial pressure of 3 mmHg.   8. Agitated saline contrast bubble study was negative, with no evidence  of any interatrial shunt.   Conclusion(s)/Recommendation(s): Normal biventricular function without  evidence of hemodynamically significant valvular heart disease.   FINDINGS   Left Ventricle: Left ventricular ejection fraction, by estimation, is 60  to 65%. The left ventricle has normal function. The left ventricle has no  regional wall motion abnormalities. The left ventricular internal cavity  size was normal in size. There is   no left ventricular  hypertrophy.  __________   2D echo 01/26/2023: 1. Left ventricular ejection fraction, by estimation, is 55 to 60%. The  left ventricle has normal function. The left ventricle has no regional  wall motion abnormalities. Left ventricular diastolic parameters are  indeterminate. The average left  ventricular global longitudinal strain is -19.0 %.   2. Right ventricular systolic function is normal. The right ventricular  size is normal. There is normal pulmonary artery systolic pressure. The  estimated right ventricular systolic pressure is 28.8 mmHg.   3. The mitral valve is normal in structure. Moderate to severe mitral  valve regurgitation. No evidence of mitral stenosis. There is mild late  systolic prolapse of multiple segments of the anterior leaflet of the  mitral valve.   4. Tricuspid valve regurgitation is mild to moderate.   5. The aortic valve is normal in structure. Aortic valve regurgitation is  not visualized. No aortic stenosis is present.   6. The inferior vena cava is normal in size with greater than 50%  respiratory variability, suggesting right atrial pressure of 3 mmHg.  __________   Luci Bank patch 12/2022: Patient had a min HR of 56 bpm, max HR of 214 bpm, and avg HR of 84 bpm. Predominant underlying rhythm was Sinus Rhythm. 106 Supraventricular Tachycardia runs occurred, the run with the fastest interval lasting 6 beats with a max rate of 214 bpm, the longest lasting 2 mins 10 secs with an avg rate of 146 bpm. Atrial Fibrillation/Flutter occurred (1% burden), ranging from 119-213 bpm (avg of 153 bpm), the longest lasting 2 hours 4 mins with an avg rate of 152 bpm.  Occasional PACs and rare PVCs. __________   Coronary CTA 04/07/2022: FINDINGS: Aorta: Normal size. Mild aortic root and descending aorta calcifications. No dissection.   Aortic Valve:  Trileaflet.  No calcifications.   Coronary Arteries:  Normal coronary origin.  Right dominance.   RCA is a dominant artery  that gives rise to PDA and PLA. There is calcified plaque proximally causing minimal stenosis (<25%).   Left main gives rise to LAD and LCX arteries.  LM has no stenosis.   LAD has no plaque.   LCX is a non-dominant artery that gives rise to two obtuse marginal branches. There is calcified plaque proximally causing mild stenosis (25-49%).   Other findings:   Normal pulmonary vein drainage into the left atrium.   Normal left atrial appendage without a thrombus.   Normal size of the pulmonary artery.   IMPRESSION: 1. Coronary calcium score of 95.3. This was 59th percentile for age and sex matched control. 2. Normal coronary origin with right dominance. 3. Mild proximal LCx stenosis (25%). 4. Minimal proximal RCA stenosis (<25%). 5. CAD-RADS 2. Mild non-obstructive CAD (25-49%). Consider non-atherosclerotic causes of chest pain. Consider preventive therapy and risk factor modification. 6. Image quality degraded by motion artifacts.   Noncardiac overread: IMPRESSION: 1. No acute extracardiac findings. 2. Tree-in-bud nodules which were present on most recent prior chest CT have resolved, likely sequela of infection or aspiration. 3. Solid pulmonary nodules of the right lower lobe are unchanged in size when compared with prior lung cancer screening CT chest CT dated July 16, 2021. Recommend attention on annual lung cancer screening CT. 4. Aortic Atherosclerosis and Emphysema. __________   2D echo 01/18/2022 Gavin Potters): MILD LV SYSTOLIC DYSFUNCTION (See above)  NORMAL RIGHT VENTRICULAR SYSTOLIC FUNCTION  NO VALVULAR STENOSIS  MODERATE MR  MILD TR, PR  EF 45%  __________   Eugenie Birks MPI 02/24/2020 Gavin Potters): Normal myocardial perfusion scan no evidence of stress-induced  medical ischemia ejection fraction of 64% conclusion negative scan  __________   2D echo 02/24/2020 Gavin Potters): NORMAL LEFT VENTRICULAR SYSTOLIC FUNCTION WITH  AN ESTIMATED EF = >55 %  NORMAL RIGHT  VENTRICULAR SYSTOLIC FUNCTION  MODERATE MITRAL VALVE INSUFFICIENCY  MILD-TO-MODERATE TRICUSPID VALVE INSUFFICIENCY  NO VALVULAR STENOSIS   Patient Profile     78 y.o. female with h/o nonobstructive CAD by coronary CTA in 04/2022, HFimpEF, PAF diagnosed in 09/2022, moderate MR, HTN, HLD, bipolar disorder, and COPD with prior tobacco use quitting in 2023 who is being seen for atrial flutter with RVR.   Assessment & Plan    Acute hypoxic and hypercarbic respiratory failure - multifactorial including COPD exacerbation with possible PNA, now with aflutter RVR - pulmonary issues per primary service  Newly diagnosed Atrial flutter with RVR with h/o PAF - patient noted to be in NSR during ER visit 1/16 and continue SR upon admission on 1/18 with develop ment of aflutter 2-1 AV block with rates in the 140s on the overnight hours going into 1/19. - suspect aflutter secondary to pulmonary issues, hypokalemia, steroids and nebulizers - started on lopressor 12.5mg  Q6H and IV amiodarone with conversion to SB 1/19 around 5:30pm - PTA Eliquis 5mg  BID - CHADSVASC at least 6 - with HR in the 50s unsure patient will tolerate BB. Noon dose was held. 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

## 2023-06-27 DIAGNOSIS — J9601 Acute respiratory failure with hypoxia: Secondary | ICD-10-CM | POA: Diagnosis not present

## 2023-06-27 DIAGNOSIS — I480 Paroxysmal atrial fibrillation: Secondary | ICD-10-CM | POA: Diagnosis not present

## 2023-06-27 LAB — BLOOD GAS, ARTERIAL
Acid-Base Excess: 6.1 mmol/L — ABNORMAL HIGH (ref 0.0–2.0)
Bicarbonate: 31.2 mmol/L — ABNORMAL HIGH (ref 20.0–28.0)
Delivery systems: POSITIVE
Expiratory PAP: 5 cm[H2O]
FIO2: 30 %
Inspiratory PAP: 10 cm[H2O]
O2 Saturation: 99.9 %
Patient temperature: 37
pCO2 arterial: 46 mm[Hg] (ref 32–48)
pH, Arterial: 7.44 (ref 7.35–7.45)
pO2, Arterial: 104 mm[Hg] (ref 83–108)

## 2023-06-27 LAB — ECHOCARDIOGRAM COMPLETE
AR max vel: 0.74 cm2
AV Area VTI: 0.78 cm2
AV Area mean vel: 0.71 cm2
AV Mean grad: 5.6 mm[Hg]
AV Peak grad: 10.3 mm[Hg]
Ao pk vel: 1.6 m/s
Area-P 1/2: 4.7 cm2
Height: 62 in
MV M vel: 4.62 m/s
MV Peak grad: 85.4 mm[Hg]
S' Lateral: 3 cm
Weight: 1777.79 [oz_av]

## 2023-06-27 LAB — BASIC METABOLIC PANEL
Anion gap: 13 (ref 5–15)
BUN: 40 mg/dL — ABNORMAL HIGH (ref 8–23)
CO2: 26 mmol/L (ref 22–32)
Calcium: 7.9 mg/dL — ABNORMAL LOW (ref 8.9–10.3)
Chloride: 100 mmol/L (ref 98–111)
Creatinine, Ser: 1.08 mg/dL — ABNORMAL HIGH (ref 0.44–1.00)
GFR, Estimated: 53 mL/min — ABNORMAL LOW (ref >60)
Glucose, Bld: 225 mg/dL — ABNORMAL HIGH (ref 70–99)
Potassium: 3.6 mmol/L (ref 3.5–5.1)
Sodium: 139 mmol/L (ref 135–145)

## 2023-06-27 LAB — GLUCOSE, CAPILLARY
Glucose-Capillary: 179 mg/dL — ABNORMAL HIGH (ref 70–99)
Glucose-Capillary: 142 mg/dL — ABNORMAL HIGH (ref 70–99)
Glucose-Capillary: 165 mg/dL — ABNORMAL HIGH (ref 70–99)
Glucose-Capillary: 167 mg/dL — ABNORMAL HIGH (ref 70–99)

## 2023-06-27 LAB — CBC
HCT: 36.9 % (ref 36.0–46.0)
Hemoglobin: 12 g/dL (ref 12.0–15.0)
MCH: 33.7 pg (ref 26.0–34.0)
MCHC: 32.5 g/dL (ref 30.0–36.0)
MCV: 103.7 fL — ABNORMAL HIGH (ref 80.0–100.0)
Platelets: 378 10*3/uL (ref 150–400)
RBC: 3.56 MIL/uL — ABNORMAL LOW (ref 3.87–5.11)
RDW: 14.1 % (ref 11.5–15.5)
WBC: 19.8 10*3/uL — ABNORMAL HIGH (ref 4.0–10.5)
nRBC: 1.6 % — ABNORMAL HIGH (ref 0.0–0.2)

## 2023-06-27 MED ORDER — METHYLPREDNISOLONE SODIUM SUCC 40 MG IJ SOLR: 20.0000 mg | Freq: Two times a day (BID) | INTRAMUSCULAR | Status: DC

## 2023-06-27 MED ORDER — AMIODARONE HCL 200 MG PO TABS: 200.0000 mg | ORAL_TABLET | Freq: Every day | ORAL | Status: DC

## 2023-06-27 MED ORDER — METHYLPREDNISOLONE SODIUM SUCC 40 MG IJ SOLR
20.0000 mg | Freq: Two times a day (BID) | INTRAMUSCULAR | Status: AC
  Administered 2023-06-27: 20 mg via INTRAVENOUS
  Filled 2023-06-27: qty 1

## 2023-06-27 MED ORDER — METOPROLOL TARTRATE 5 MG/5ML IV SOLN
5.0000 mg | Freq: Once | INTRAVENOUS | Status: AC
  Administered 2023-06-27: 5 mg via INTRAVENOUS
  Filled 2023-06-27: qty 5

## 2023-06-27 MED ORDER — METOPROLOL SUCCINATE ER 50 MG PO TB24
50.0000 mg | ORAL_TABLET | Freq: Every day | ORAL | Status: DC
  Administered 2023-06-27: 50 mg via ORAL
  Filled 2023-06-27: qty 1

## 2023-06-27 MED ORDER — PREDNISONE 20 MG PO TABS
40.0000 mg | ORAL_TABLET | Freq: Every day | ORAL | Status: DC
  Administered 2023-06-28: 40 mg via ORAL
  Filled 2023-06-27: qty 2

## 2023-06-27 MED ORDER — AMIODARONE HCL 200 MG PO TABS
400.0000 mg | ORAL_TABLET | Freq: Two times a day (BID) | ORAL | Status: DC
  Administered 2023-06-27 – 2023-06-28 (×3): 400 mg via ORAL
  Filled 2023-06-27 (×3): qty 2

## 2023-06-27 MED ORDER — IPRATROPIUM-ALBUTEROL 0.5-2.5 (3) MG/3ML IN SOLN
3.0000 mL | Freq: Three times a day (TID) | RESPIRATORY_TRACT | Status: DC
  Administered 2023-06-27 (×2): 3 mL via RESPIRATORY_TRACT
  Filled 2023-06-27 (×2): qty 3

## 2023-06-27 MED ORDER — AMIODARONE HCL 200 MG PO TABS: 200.0000 mg | ORAL_TABLET | Freq: Two times a day (BID) | ORAL | Status: DC

## 2023-06-27 NOTE — Plan of Care (Signed)

## 2023-06-27 NOTE — Evaluation (Signed)
Physical Therapy Evaluation Patient Details Name: Taylor Chambers MRN: [id number] DOB: 1945-11-09 Today's Date: 06/27/2023  History of Present Illness  Patient is a 78 year old female presenting with acute respiratory failure with hypoxia,  acute on chronic HFpEF, COPD exacerbation, pneumonia, sepsis. History of  COPD, CAD, atrial fibrillation, HFpEF, mitral insufficiency, hyperlipidemia, hypertension  Clinical Impression  Patient is agreeable to PT evaluation. She is independent at baseline with 2 falls reported recently. She lives with her spouse.  Today the patient required Min A for steadying with standing and intermittently with short distance ambulation. She has generalized weakness and is fatigued with mobility. She walked 56ft with rolling walker in the room, Sp02 remained in the 90's on 3 L02. The patient is hopeful to return home soon. Recommend to continue PT to maximize independence and facilitate return to prior level of function.       If plan is discharge home, recommend the following: A little help with walking and/or transfers;A little help with bathing/dressing/bathroom;Assist for transportation;Help with stairs or ramp for entrance;Assistance with cooking/housework   Can travel by private vehicle        Equipment Recommendations None recommended by PT  Recommendations for Other Services       Functional Status Assessment Patient has had a recent decline in their functional status and demonstrates the ability to make significant improvements in function in a reasonable and predictable amount of time.     Precautions / Restrictions Precautions Precautions: Fall Restrictions Weight Bearing Restrictions Per Provider Order: No      Mobility  Bed Mobility Overal bed mobility: Needs Assistance Bed Mobility: Supine to Sit     Supine to sit: Contact guard, HOB elevated     General bed mobility comments: increased time required    Transfers Overall transfer level:  Needs assistance Equipment used: Rolling walker (2 wheels) Transfers: Sit to/from Stand Sit to Stand: Min assist           General transfer comment: steadying assistance provided. cues for hand placement    Ambulation/Gait Ambulation/Gait assistance: Contact guard assist, Min assist Gait Distance (Feet): 15 Feet Assistive device: Rolling walker (2 wheels) Gait Pattern/deviations: Decreased stride length, Narrow base of support Gait velocity: decreased     General Gait Details: mild unsteadiness requiring Min A for safety. activity tolerance limited by fatigue and generalized weakness. Sp02 in the 90's on 3 L02 with occasional cues for breathing techniques  Stairs            Wheelchair Mobility     Tilt Bed    Modified Rankin (Stroke Patients Only)       Balance Overall balance assessment: Mild deficits observed, not formally tested                                           Pertinent Vitals/Pain Pain Assessment Pain Assessment: No/denies pain    Home Living Family/patient expects to be discharged to:: Private residence Living Arrangements: Spouse/significant other Available Help at Discharge: Family;Available 24 hours/day Type of Home: House Home Access: Stairs to enter   [employer] of Steps: 18 steps, has a Theatre manager.   Home Layout: Two level;Able to live on main level with bedroom/bathroom Home Equipment: Shower seat;Grab bars - tub/shower;Hand held shower head;Rolling Walker (2 wheels);BSC/3in1      Prior Function Prior Level of Function : Independent/Modified Independent  Mobility Comments: Walking independently but has had 2 falls in last 6 months ADLs Comments: Independent with ADLs.     Extremity/Trunk Assessment   Upper Extremity Assessment Upper Extremity Assessment: Generalized weakness    Lower Extremity Assessment Lower Extremity Assessment: Generalized weakness       Communication    Communication Communication: No apparent difficulties  Cognition Arousal: Alert Behavior During Therapy: WFL for tasks assessed/performed Overall Cognitive Status: Within Functional Limits for tasks assessed                                          General Comments General comments (skin integrity, edema, etc.): patient is fatigued with activity    Exercises     Assessment/Plan    PT Assessment Patient needs continued PT services  PT Problem List Decreased strength;Decreased activity tolerance;Decreased balance;Decreased mobility;Cardiopulmonary status limiting activity       PT Treatment Interventions DME instruction;Gait training;Stair training;Functional mobility training;Therapeutic activities;Therapeutic exercise;Neuromuscular re-education;Balance training;Cognitive remediation    PT Goals (Current goals can be found in the Care Plan section)  Acute Rehab PT Goals Patient Stated Goal: to go home soon PT Goal Formulation: With patient Time For Goal Achievement: 07/11/23 Potential to Achieve Goals: Fair    Frequency Min 1X/week     Co-evaluation PT/OT/SLP Co-Evaluation/Treatment: Yes Reason for Co-Treatment: Complexity of the patient's impairments (multi-system involvement) (low endurance level) PT goals addressed during session: Mobility/safety with mobility         AM-PAC PT "6 Clicks" Mobility  Outcome Measure Help needed turning from your back to your side while in a flat bed without using bedrails?: A Little Help needed moving from lying on your back to sitting on the side of a flat bed without using bedrails?: A Little Help needed moving to and from a bed to a chair (including a wheelchair)?: A Little Help needed standing up from a chair using your arms (e.g., wheelchair or bedside chair)?: A Little Help needed to walk in hospital room?: A Little Help needed climbing 3-5 steps with a railing? : A Lot 6 Click Score: 17    End of Session  Equipment Utilized During Treatment: Oxygen Activity Tolerance: Patient tolerated treatment well Patient left: in chair;with call bell/phone within reach Nurse Communication: Mobility status PT Visit Diagnosis: Muscle weakness (generalized) (M62.81);Unsteadiness on feet (R26.81)    Time: 6962-9528 PT Time Calculation (min) (ACUTE ONLY): 23 min   Charges:   PT Evaluation $PT Eval Moderate Complexity: 1 Mod   PT General Charges $$ ACUTE PT VISIT: 1 Visit         Donna Bernard, PT, MPT   Ina Homes 06/27/2023, 10:50 AM

## 2023-06-27 NOTE — Progress Notes (Signed)
NAME:  Taylor Chambers, MRN:  [id number], DOB:  [birth year], LOS: 3 ADMISSION DATE:  06/24/2023, CONSULTATION DATE:  06/26/2023 REFERRING MD:  Dr. Ashok Pall, CHIEF COMPLAINT:  Acute Respiratory Distress   Brief Pt Description / Synopsis:  78 y.o. female admitted with Acute Hypoxic & Hypercapnic Respiratory Failure in the setting of AECOPD, Pneumonia, and Acute Decompensated HFpEF requiring BiPAP.  Course complicated by Atrial Flutter with RVR.   History of Present Illness:  Taylor Chambers is a 78 y.o. female with medical history significant of COPD, CAD, atrial fibrillation, HFpEF, mitral insufficiency, hyperlipidemia, hypertension, tobacco abuse presenting with acute respiratory failure with hypoxia, acute on chronic HFpEF, COPD exacerbation, pneumonia, sepsis.  Patient reports increased work of breathing over the past 2 to 3 days.  Positive cough wheezing and sputum production.  Minimal malaise.  Mild orthopnea.  No reported fevers or chills.  Previously smoking 1 pack/day.  Patient states she no longer smokes.  No abdominal pain or diarrhea.  Symptoms have progressively worsened over this timeframe.  Baseline atrial fibrillation and CAD followed by [facility] outpatient.  Has been compliant with medication regimen per report.   She was admitted by [facility] for further workup and treatment.  Please see "Significant Hospital Events" section below for full detailed hospital course.   Pertinent  Medical History   Past Medical History:  Diagnosis Date   Allergies    Anxiety    Aortic atherosclerosis (HCC)    Bipolar affective disorder (HCC)    COPD (chronic obstructive pulmonary disease) (HCC)    Coronary artery calcification seen on CT scan    Depression    Diastolic dysfunction 02/24/2020   a.) TTE 02/24/2020: EF >55%; triv PR, mild TR, mod MR; G1DD.   DOE (dyspnea on exertion)    Emphysema lung (HCC)    History of cataract    HLD (hyperlipidemia)    Hypertension    Macular degeneration    Migraines     Osteoporosis    Pneumonia    Tobacco use     Micro Data:  1/18: SARS-CoV-2/RSV/influenza PCR>> negative 1/18: Strep pneumo urinary antigen>> negative 1/18: Blood culture x 2>> no growth to date 1/19: Respiratory viral panel>> negative 1/19: MRSA PCR>> negative  Antimicrobials:   Anti-infectives (From admission, onward)    Start     Dose/Rate Route Frequency Ordered Stop   06/26/23 1245  ceFEPIme (MAXIPIME) 2 g in sodium chloride 0.9 % 100 mL IVPB        2 g 200 mL/hr over 30 Minutes Intravenous 2 times daily 06/26/23 1148     06/26/23 0500  azithromycin (ZITHROMAX) 500 mg in sodium chloride 0.9 % 250 mL IVPB        500 mg 250 mL/hr over 60 Minutes Intravenous Every 24 hours 06/25/23 0934 06/27/23 0652   06/26/23 0500  cefTRIAXone (ROCEPHIN) 1 g in sodium chloride 0.9 % 100 mL IVPB  Status:  Discontinued        1 g 200 mL/hr over 30 Minutes Intravenous Every 24 hours 06/25/23 0935 06/26/23 0927   06/25/23 0500  cefTRIAXone (ROCEPHIN) 2 g in sodium chloride 0.9 % 100 mL IVPB  Status:  Discontinued        2 g 200 mL/hr over 30 Minutes Intravenous Every 24 hours 06/24/23 0917 06/25/23 0935   06/25/23 0500  azithromycin (ZITHROMAX) 500 mg in sodium chloride 0.9 % 250 mL IVPB  Status:  Discontinued        500 mg 250 mL/hr over 60  Minutes Intravenous Every 24 hours 06/24/23 0917 06/25/23 0934   06/24/23 0630  vancomycin (VANCOCIN) IVPB 1000 mg/200 mL premix        1,000 mg 200 mL/hr over 60 Minutes Intravenous  Once 06/24/23 0620 06/24/23 0743   06/24/23 0630  ceFEPIme (MAXIPIME) 2 g in sodium chloride 0.9 % 100 mL IVPB        2 g 200 mL/hr over 30 Minutes Intravenous  Once 06/24/23 0620 06/24/23 0727       Significant Hospital Events: Including procedures, antibiotic start and stop dates in addition to other pertinent events   1/18: Admitted by [facility] ton BiPAP. 1/19: Weaned off BiPAP.  Cardiology consulted for Atrial Flutter with RVR. 1/20: Pt confused and with increased WOB.   Placed on BiPAP, PCCM consulted.  Broaden ABX to Cefepime.  Gentle diuresis with 20 mg IV Lasix x1 dose. 1/21: Mentation and Respiratory status much improved, weaned to 3L [place].  PCCM to sign off  Interim History / Subjective:  -As outlined above in Significant Hospital Events Section above  Objective   Blood pressure 113/71, pulse 84, temperature 98.2 F (36.8 C), resp. rate 19, height 5\' 2"  (1.575 m), weight 50.4 kg, SpO2 97%.    FiO2 (%):  [30 %-40 %] 30 %   Intake/Output Summary (Last 24 hours) at 06/27/2023 0854 Last data filed at 06/27/2023 0600 Gross per 24 hour  Intake 725.82 ml  Output 650 ml  Net 75.82 ml   Filed Weights   06/25/23 0832  Weight: 50.4 kg    Examination: General: Acute on chronically ill-appearing female, laying in bed,on 3L [place], in NAD HENT: Atraumatic, normocephalic, neck supple, no JVD Lungs: Clear breath sounds throughout, even, nonlabored Cardiovascular: Regular rate and rhythm, S1-S2, no murmurs, rubs, gallops Abdomen: Soft, nontender, nondistended, no guarding or rebound tenderness, bowel sounds positive x 4 Extremities: Normal bulk and tone, no deformities, no edema, warm and well-perfused Neuro: Awake and alert, moves all extremities to commands, no focal deficits, pupils PERRLA GU: External female catheter in place  Resolved Hospital Problem list     Assessment & Plan:   #Acute Hypoxic & Hypercapnic Respiratory Failure #Acute COPD Exacerbation #Community Acquired Pneumonia #Small Pleural Effusions CTa Chest on 1/18 with NO evidence of PE, concern for multifocal infecting and dependent atelectasis and questionable pulmonary edema, moderate emphysema. -Supplemental O2 as needed to maintain O2 sats 88 to 92% -BiPAP weaned off ~ would recommend BiPAP at bedtime  -Follow intermittent Chest X-ray & ABG as needed -Bronchodilators & Pulmicort nebs -IV Steroids ~ decreased Solumedrol to 20 mg BID on 1/21 -ABX as above -Diuresis as BP and renal  function permits  -Pulmonary toilet as able  #Acute Decompensated HFpEF #Atrial Flutter with RVR ~ CONVERTED TO NSR  #Mildly Elevated Troponin, suspect demand ischemia #Moderate Mitral Regurgitation PMHx: Nonobstructive CAD, HTN Echocardiogram (TEE) 04/20/23: LVEF 60-65%, RV systolic function is normal, RV size is normal, moderate MR, mild atheroma plaque of the aortic aorch and descending aorta, NO atrial appendage thrombus -Continuous cardiac monitoring -Maintain MAP >65 -Vasopressors as needed to maintain MAP goal ~ not requiring -Trend lactic acid until normalized -HS Troponin peaked at 32 -BNP is 678 on 1/20 -Diuresis as BP and renal function permits ~ will defer to Cardiology -Cardiology following, appreciate input -Continue Amiodarone and Eliquis as per Cardiology  #Concern for Pneumonia -Monitor fever curve -Trend WBC's & Procalcitonin -Follow cultures as above -Continue empiric Azithromycin and Cefepime for now pending cultures & sensitivities  #Acute  Metabolic Encephalopathy ~ IMPROVED  #Concern for CO2 Narcosis -Treatment of metabolic derangements as outlined above -Provide supportive care -Promote normal sleep/wake cycle and family presence -Avoid sedating medications as able -BiPAP at bedtime      Patient is critically ill, currently on BiPAP with increased work of breathing.  High risk for further decompensation necessitating intubation and mechanical ventilation, along with cardiac arrest and death.   Best Practice (right click and "Reselect all SmartList Selections" daily)   Diet/type: Regular  DVT prophylaxis: DOAC GI prophylaxis: N/A Lines: N/A Foley:  N/A Code Status:  full code Last date of multidisciplinary goals of care discussion [1/21]  1/21: Pt's husband updated at bedside on plan of care.   Labs   CBC: Recent Labs  Lab 06/22/23 0944 06/24/23 0534 06/25/23 0436 06/26/23 0900 06/27/23 0414  WBC 10.5 18.5* 21.0* 25.4* 19.8*   NEUTROABS  --  14.0*  --   --   --   HGB 11.7* 12.5 11.4* 12.6 12.0  HCT 36.0 39.9 34.2* 40.2 36.9  MCV 105.9* 108.7* 102.7* 109.5* 103.7*  PLT 339 444* 385 478* 378    Basic Metabolic Panel: Recent Labs  Lab 06/22/23 0944 06/24/23 0534 06/25/23 0435 06/25/23 0436 06/25/23 1409 06/26/23 0243 06/27/23 0414  NA 145 142  --  138  --  137 139  K 4.0 4.0  --  2.9* 4.0 4.0 3.6  CL 107 103  --  98  --  96* 100  CO2 25 24  --  26  --  26 26  GLUCOSE 125* 349*  --  106*  --  176* 225*  BUN 23 29*  --  22  --  33* 40*  CREATININE 0.89 0.87  --  0.67  --  1.05* 1.08*  CALCIUM 9.2 8.6*  --  8.0*  --  8.2* 7.9*  MG  --   --  2.2  --   --   --   --    GFR: Estimated Creatinine Clearance: 35 mL/min (A) (by C-G formula based on SCr of 1.08 mg/dL (H)). Recent Labs  Lab 06/24/23 0534 06/24/23 0723 06/25/23 0436 06/26/23 0243 06/26/23 0900 06/27/23 0414  PROCALCITON <0.10  --   --   --  0.14  --   WBC 18.5*  --  21.0*  --  25.4* 19.8*  LATICACIDVEN 4.6* 3.2*  --  3.1*  --   --     Liver Function Tests: Recent Labs  Lab 06/22/23 0944 06/24/23 0534 06/25/23 0436  AST 72* 97* 42*  ALT 57* 86* 61*  ALKPHOS 63 68 63  BILITOT 0.9 0.8 1.0  PROT 6.3* 6.4* 6.0*  ALBUMIN 3.4* 3.4* 3.4*   No results for input(s): "LIPASE", "AMYLASE" in the last 168 hours. No results for input(s): "AMMONIA" in the last 168 hours.  ABG    Component Value Date/Time   PHART 7.44 06/27/2023 0350   PCO2ART 46 06/27/2023 0350   PO2ART 104 06/27/2023 0350   HCO3 31.2 (H) 06/27/2023 0350   TCO2 27 11/10/2021 0757   ACIDBASEDEF 1.4 06/26/2023 0907   O2SAT 99.9 06/27/2023 0350     Coagulation Profile: Recent Labs  Lab 06/24/23 0534  INR 1.4*    Cardiac Enzymes: No results for input(s): "CKTOTAL", "CKMB", "CKMBINDEX", "TROPONINI" in the last 168 hours.  HbA1C: Hgb A1c MFr Bld  Date/Time Value Ref Range Status  06/25/2023 04:36 AM 5.6 4.8 - 5.6 % Final    Comment:    (NOTE) Pre  diabetes:           5.7%-6.4%  Diabetes:              >6.4%  Glycemic control for   <7.0% adults with diabetes   09/05/2022 03:11 PM 5.4 4.8 - 5.6 % Final    Comment:    (NOTE)         Prediabetes: 5.7 - 6.4         Diabetes: >6.4         Glycemic control for adults with diabetes: <7.0     CBG: Recent Labs  Lab 06/26/23 1224 06/26/23 1607 06/26/23 1928 06/26/23 2154 06/27/23 0720  GLUCAP 140* 122* 118* 112* 179*    Review of Systems:   Positives in BOLD: Gen: Denies fever, chills, weight change, fatigue, night sweats HEENT: Denies blurred vision, double vision, hearing loss, tinnitus, sinus congestion, rhinorrhea, sore throat, neck stiffness, dysphagia PULM: Denies shortness of breath, cough, sputum production, hemoptysis, wheezing CV: Denies chest pain, edema, orthopnea, paroxysmal nocturnal dyspnea, palpitations GI: Denies abdominal pain, nausea, vomiting, diarrhea, hematochezia, melena, constipation, change in bowel habits GU: Denies dysuria, hematuria, polyuria, oliguria, urethral discharge Endocrine: Denies hot or cold intolerance, polyuria, polyphagia or appetite change Derm: Denies rash, dry skin, scaling or peeling skin change Heme: Denies easy bruising, bleeding, bleeding gums Neuro: Denies headache, numbness, weakness, slurred speech, loss of memory or consciousness    Past Medical History:  She,  has a past medical history of Allergies, Anxiety, Aortic atherosclerosis (HCC), Bipolar affective disorder (HCC), COPD (chronic obstructive pulmonary disease) (HCC), Coronary artery calcification seen on CT scan, Depression, Diastolic dysfunction (02/24/2020), DOE (dyspnea on exertion), Emphysema lung (HCC), History of cataract, HLD (hyperlipidemia), Hypertension, Macular degeneration, Migraines, Osteoporosis, Pneumonia, and Tobacco use.   Surgical History:   Past Surgical History:  Procedure Laterality Date   CATARACT EXTRACTION     COLONOSCOPY N/A 08/30/2021   Procedure:  COLONOSCOPY;  Surgeon: Jaynie Collins, DO;  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 Her smoking use included cigarettes. She started smoking about 51 years ago. She has a 12.5 pack-year smoking history. She has never used smokeless tobacco. She reports that she does not drink alcohol and does not use drugs.   Family History:  Her family history includes Breast cancer (age of onset: 22) in her mother; Heart Problems in her maternal uncle, paternal aunt, and paternal uncle.   Allergies Allergies  Allergen Reactions   Bupropion Other (See Comments)    Foggy brain     Home Medications  Prior to Admission medications   Medication Sig Start Date End Date Taking? Authorizing Provider  albuterol (VENTOLIN HFA) 108 (90 Base) MCG/ACT inhaler Inhale 2 puffs into the lungs every 6 (six) hours as needed for wheezing or shortness of breath. 06/22/23  Yes Claybon Jabs, MD  apixaban (ELIQUIS) 5 MG TABS tablet Take 1 tablet (5 mg total) by mouth 2 (  two) times daily. 12/13/22  Yes Iran Ouch, MD  Calcium Carbonate (CALCIUM 500 PO) Take 500 mg by mouth daily at 12 noon.   Yes [provider]  Cholecalciferol (VITAMIN D3) 1.25 MG (50000 UT) CAPS Take 5,000 Units by mouth daily in the afternoon.   Yes [provider]  doxycycline (VIBRA-TABS) 100 MG tablet Take 1 tablet (100 mg total) by mouth 2 (two) times daily for 10 days. 06/22/23  07/02/23 Yes Claybon Jabs, MD  Dupilumab 300 MG/2ML SOAJ Inject 300 mg into the skin every 14 (fourteen) days. Saturday 05/26/23  Yes [provider]  EPINEPHrine 0.3 mg/0.3 mL IJ SOAJ injection Inject 0.3 mg into the muscle as needed for anaphylaxis. 05/26/23  Yes [provider]  ibuprofen (ADVIL) 200 MG tablet Take 400 mg by mouth every 6 (six) hours as needed for headache.   Yes [provider]  Ipratropium-Albuterol (COMBIVENT RESPIMAT) 20-100 MCG/ACT AERS respimat Inhale 1 puff into the lungs 2 (two) times daily.   Yes [provider]  ipratropium-albuterol (DUONEB) 0.5-2.5 (3) MG/3ML SOLN Take 3 mLs by nebulization 2 (two) times daily. Mix with budesonide   Yes [provider]  losartan (COZAAR) 100 MG tablet Take 100 mg by mouth daily.   Yes [provider]  metoprolol succinate (TOPROL-XL) 50 MG 24 hr tablet Take 1 tablet (50 mg total) by mouth daily. Take with or immediately following a meal. 01/06/23 06/24/23 Yes Iran Ouch, MD  Multiple Vitamins-Minerals (PRESERVISION AREDS 2+MULTI VIT PO) Take 2 tablets by mouth daily.   Yes [provider]  omeprazole (PRILOSEC) 40 MG capsule Take 40 mg by mouth daily as needed (Heartburn). 03/31/22 06/24/23 Yes [provider]  pravastatin (PRAVACHOL) 10 MG tablet Take 10 mg by mouth daily.   Yes [provider]  predniSONE (DELTASONE) 20 MG tablet Take 2 tablets (40 mg total) by mouth daily with breakfast for 4 days. 06/23/23 06/27/23 Yes Claybon Jabs, MD  sertraline (ZOLOFT) 100 MG tablet Take 100 mg by mouth daily. 06/29/22 06/29/23 Yes [provider]  sulfamethoxazole-trimethoprim (BACTRIM) 400-80 MG tablet Take 1 tablet by mouth 3 (three) times a week. Monday, Wednesday and Friday 03/06/23  Yes [provider]  OHTUVAYRE 3 MG/2.5ML SUSP Take 3 mg by nebulization daily. 05/31/23   [provider]     Care time: 40 minutes     Harlon Ditty, AGACNP-BC [facility] Prefer epic messenger for cross cover needs If after hours, please call E-link

## 2023-06-27 NOTE — Progress Notes (Signed)
Rounding Note    Patient Name: Taylor Chambers Date of Encounter: 06/27/2023  [facility] Cardiologist: Lorine Bears, MD   Subjective   Patient feels tired this AM. She denies chest pain or shortness of breath. She remains in NSR.   Inpatient Medications    Scheduled Meds:  apixaban  5 mg Oral BID   budesonide (PULMICORT) nebulizer solution  0.5 mg Nebulization BID   Chlorhexidine Gluconate Cloth  6 each Topical Daily   feeding supplement  237 mL Oral BID BM   insulin aspart  0-6 Units Subcutaneous TID WC   ipratropium-albuterol  3 mL Nebulization TID   methylPREDNISolone (SOLU-MEDROL) injection  40 mg Intravenous Q12H   metoprolol tartrate  12.5 mg Oral Q6H   sertraline  100 mg Oral Daily   Continuous Infusions:  amiodarone 30 mg/hr (06/27/23 0433)   ceFEPime (MAXIPIME) IV Stopped (06/26/23 2223)   PRN Meds: hydrOXYzine, levalbuterol, ondansetron **OR** ondansetron (ZOFRAN) IV   Vital Signs    Vitals:   06/27/23 0400 06/27/23 0500 06/27/23 0554 06/27/23 0600  BP: 132/67 124/69 124/69 129/73  Pulse: 82 79 77 81  Resp: (!) 27 (!) 29  (!) 23  Temp:      TempSrc:      SpO2: 99% 99%  99%  Weight:      Height:        Intake/Output Summary (Last 24 hours) at 06/27/2023 0824 Last data filed at 06/27/2023 0600 Gross per 24 hour  Intake 725.82 ml  Output 650 ml  Net 75.82 ml      06/25/2023    8:32 AM 06/22/2023    9:38 AM 05/16/2023   10:09 AM  Last 3 Weights  Weight (lbs) 111 lb 1.8 oz 109 lb 110 lb 6.4 oz  Weight (kg) 50.4 kg 49.442 kg 50.077 kg      Telemetry    Nsr HR 70-80s - Personally Reviewed  ECG    No new - Personally Reviewed  Physical Exam   GEN: No acute distress.   Neck: No JVD Cardiac: RRR, no murmurs, rubs, or gallops.  Respiratory: diffusely diminished GI: Soft, nontender, non-distended  MS: No edema; No deformity. Neuro:  Nonfocal  Psych: Normal affect   Labs    High Sensitivity Troponin:   Recent Labs  Lab  06/22/23 1323 06/24/23 0534 06/24/23 0723 06/26/23 0900 06/26/23 1055  TROPONINIHS 25* 31* 32* 27* 25*     Chemistry Recent Labs  Lab 06/22/23 0944 06/24/23 0534 06/25/23 0435 06/25/23 0436 06/25/23 1409 06/26/23 0243 06/27/23 0414  NA 145 142  --  138  --  137 139  K 4.0 4.0  --  2.9* 4.0 4.0 3.6  CL 107 103  --  98  --  96* 100  CO2 25 24  --  26  --  26 26  GLUCOSE 125* 349*  --  106*  --  176* 225*  BUN 23 29*  --  22  --  33* 40*  CREATININE 0.89 0.87  --  0.67  --  1.05* 1.08*  CALCIUM 9.2 8.6*  --  8.0*  --  8.2* 7.9*  MG  --   --  2.2  --   --   --   --   PROT 6.3* 6.4*  --  6.0*  --   --   --   ALBUMIN 3.4* 3.4*  --  3.4*  --   --   --   AST 72* 97*  --  42*  --   --   --   ALT 57* 86*  --  61*  --   --   --   ALKPHOS 63 68  --  63  --   --   --   BILITOT 0.9 0.8  --  1.0  --   --   --   GFRNONAA >60 >60  --  >60  --  55* 53*  ANIONGAP 13 15  --  14  --  15 13    Lipids No results for input(s): "CHOL", "TRIG", "HDL", "LABVLDL", "LDLCALC", "CHOLHDL" in the last 168 hours.  Hematology Recent Labs  Lab 06/25/23 0436 06/26/23 0900 06/27/23 0414  WBC 21.0* 25.4* 19.8*  RBC 3.33* 3.67* 3.56*  HGB 11.4* 12.6 12.0  HCT 34.2* 40.2 36.9  MCV 102.7* 109.5* 103.7*  MCH 34.2* 34.3* 33.7  MCHC 33.3 31.3 32.5  RDW 13.9 14.4 14.1  PLT 385 478* 378   Thyroid  Recent Labs  Lab 06/24/23 0534  TSH 7.817*    BNP Recent Labs  Lab 06/22/23 0944 06/24/23 0534 06/26/23 0900  BNP 1,169.8* 1,629.4* 678.7*    DDimer  Recent Labs  Lab 06/24/23 0743  DDIMER 1.63*     Radiology    ECHOCARDIOGRAM COMPLETE Result Date: 06/27/2023    ECHOCARDIOGRAM REPORT   Patient Name:   Taylor Chambers Date of Exam: 06/26/2023 Medical Rec #:  [id number]   Height:       62.0 in Accession #:    [id number]  Weight:       111.1 lb Date of Birth:  June 15, 1945   BSA:          1.489 m Patient Age:    78 years    BP:           146/88 mmHg Patient Gender: F           HR:           76 bpm. Exam  Location:  [facility] Procedure: 2D Echo, Cardiac Doppler and Color Doppler Indications:     Elevated Troponin  History:         Patient has prior history of Echocardiogram examinations, most                  recent 01/26/2023. COPD, Signs/Symptoms:Dyspnea; Risk                  Factors:Hypertension, Dyslipidemia and Former Smoker.                  Emphysema.  Sonographer:     Daphine Deutscher RDCS Referring Phys:  [id number] Francee Nodal Colisha Redler Diagnosing Phys: Cristal Deer End MD IMPRESSIONS  1. Left ventricular ejection fraction, by estimation, is 55 to 60%. The left ventricle has normal function. The left ventricle has no regional wall motion abnormalities. Left ventricular diastolic parameters are consistent with Grade II diastolic dysfunction (pseudonormalization).  2. Right ventricular systolic function is normal. The right ventricular size is normal. There is moderately elevated pulmonary artery systolic pressure.  3. The mitral valve is degenerative. Moderate to severe mitral valve regurgitation. No evidence of mitral stenosis.  4. Tricuspid valve regurgitation is mild to moderate.  5. The aortic valve is tricuspid. There is mild thickening of the aortic valve. Aortic valve regurgitation is trivial. Aortic valve sclerosis/calcification is present, without any evidence of aortic stenosis.  6. The inferior vena cava is normal in size with <50% respiratory variability, suggesting right atrial pressure of 8  mmHg. FINDINGS  Left Ventricle: Left ventricular ejection fraction, by estimation, is 55 to 60%. The left ventricle has normal function. The left ventricle has no regional wall motion abnormalities. The left ventricular internal cavity size was normal in size. There is  no left ventricular hypertrophy. Left ventricular diastolic parameters are consistent with Grade II diastolic dysfunction (pseudonormalization). Right Ventricle: The right ventricular size is normal. No increase in right ventricular wall thickness.  Right ventricular systolic function is normal. There is moderately elevated pulmonary artery systolic pressure. The tricuspid regurgitant velocity is 3.36 m/s, and with an assumed right atrial pressure of 8 mmHg, the estimated right ventricular systolic pressure is 53.2 mmHg. Left Atrium: Left atrial size was normal in size. Right Atrium: Right atrial size was normal in size. Pericardium: There is no evidence of pericardial effusion. Mitral Valve: The mitral valve is degenerative in appearance. There is moderate thickening of the mitral valve leaflet(s). Moderate to severe mitral valve regurgitation. No evidence of mitral valve stenosis. Tricuspid Valve: The tricuspid valve is normal in structure. Tricuspid valve regurgitation is mild to moderate. Aortic Valve: The aortic valve is tricuspid. There is mild thickening of the aortic valve. There is mild to moderate aortic valve annular calcification. Aortic valve regurgitation is trivial. Aortic valve sclerosis/calcification is present, without any evidence of aortic stenosis. Aortic valve mean gradient measures 5.6 mmHg. Aortic valve peak gradient measures 10.3 mmHg. Aortic valve area, by VTI measures 0.78 cm. Pulmonic Valve: The pulmonic valve was grossly normal. Pulmonic valve regurgitation is trivial. No evidence of pulmonic stenosis. Aorta: The aortic root is normal in size and structure. Pulmonary Artery: The pulmonary artery is not well seen. Venous: The inferior vena cava is normal in size with less than 50% respiratory variability, suggesting right atrial pressure of 8 mmHg. IAS/Shunts: No atrial level shunt detected by color flow Doppler.  LEFT VENTRICLE PLAX 2D LVIDd:         4.60 cm   Diastology LVIDs:         3.00 cm   LV e' medial:    7.58 cm/s LV PW:         0.50 cm   LV E/e' medial:  14.1 LV IVS:        0.50 cm   LV e' lateral:   7.51 cm/s LVOT diam:     1.50 cm   LV E/e' lateral: 14.3 LV SV:         24 LV SV Index:   16 LVOT Area:     1.77 cm  RIGHT  VENTRICLE             IVC RV Basal diam:  3.20 cm     IVC diam: 1.70 cm RV S prime:     12.67 cm/s TAPSE (M-mode): 3.0 cm LEFT ATRIUM           Index        RIGHT ATRIUM          Index LA diam:      4.10 cm 2.75 cm/m   RA Area:     9.39 cm LA Vol (A2C): 44.6 ml 29.95 ml/m  RA Volume:   19.10 ml 12.83 ml/m LA Vol (A4C): 28.5 ml 19.14 ml/m  AORTIC VALVE AV Area (Vmax):    0.74 cm AV Area (Vmean):   0.71 cm AV Area (VTI):     0.78 cm AV Vmax:           160.47 cm/s AV Vmean:  113.058 cm/s AV VTI:            0.303 m AV Peak Grad:      10.3 mmHg AV Mean Grad:      5.6 mmHg LVOT Vmax:         67.10 cm/s LVOT Vmean:        45.267 cm/s LVOT VTI:          0.134 m LVOT/AV VTI ratio: 0.44  AORTA Ao Root diam: 2.40 cm MITRAL VALVE                TRICUSPID VALVE MV Area (PHT): 4.70 cm     TR Peak grad:   45.2 mmHg MV Decel Time: 162 msec     TR Vmax:        336.00 cm/s MR Peak grad: 85.4 mmHg MR Mean grad: 65.5 mmHg     SHUNTS MR Vmax:      462.00 cm/s   Systemic VTI:  0.13 m MR Vmean:     395.5 cm/s    Systemic Diam: 1.50 cm MV E velocity: 107.00 cm/s MV A velocity: 70.60 cm/s MV E/A ratio:  1.52 Yvonne Kendall MD Electronically signed by Yvonne Kendall MD Signature Date/Time: 06/27/2023/7:23:05 AM    Final    DG Chest Port 1 View Result Date: 06/26/2023 CLINICAL DATA:  Dyspnea.  History of COPD. EXAM: PORTABLE CHEST 1 VIEW COMPARISON:  Chest radiograph and CTA chest dated June 24, 2023. FINDINGS: Stable cardiomediastinal silhouette. Aortic atherosclerosis. Hyperinflation with emphysematous changes, compatible with COPD. Persistent small bilateral pleural effusions with basilar atelectasis. Similar left-sided basilar predominant interstitial changes. No pneumothorax. The visualized osseous structures are unchanged. IMPRESSION: 1. Persistent small bilateral pleural effusions with basilar atelectasis. 2. Similar left basilar predominant interstitial opacities, could reflect a combination of infiltrate,  asymmetric edema, and/or atelectasis. Electronically Signed   By: Hart Robinsons M.D.   On: 06/26/2023 09:23    Cardiac Studies   Echo 06/26/23   1. Left ventricular ejection fraction, by estimation, is 55 to 60%. The  left ventricle has normal function. The left ventricle has no regional  wall motion abnormalities. Left ventricular diastolic parameters are  consistent with Grade II diastolic  dysfunction (pseudonormalization).   2. Right ventricular systolic function is normal. The right ventricular  size is normal. There is moderately elevated pulmonary artery systolic  pressure.   3. The mitral valve is degenerative. Moderate to severe mitral valve  regurgitation. No evidence of mitral stenosis.   4. Tricuspid valve regurgitation is mild to moderate.   5. The aortic valve is tricuspid. There is mild thickening of the aortic  valve. Aortic valve regurgitation is trivial. Aortic valve  sclerosis/calcification is present, without any evidence of aortic  stenosis.   6. The inferior vena cava is normal in size with <50% respiratory  variability, suggesting right atrial pressure of 8 mmHg.   TEE 04/20/2023: 1. Left ventricular ejection fraction, by estimation, is 60 to 65%. The  left ventricle has normal function. The left ventricle has no regional  wall motion abnormalities.   2. Right ventricular systolic function is normal. The right ventricular  size is normal.   3. No left atrial/left atrial appendage thrombus was detected.   4. The mitral valve is normal in structure. Moderate mitral valve  regurgitation. No evidence of mitral stenosis. No significant valve  prolapse noted.   5. The aortic valve is normal in structure. Aortic valve regurgitation is  not visualized. No aortic stenosis  is present.   6. There is mild (Grade II) atheroma plaque involving the aortic arch and  descending aorta.   7. The inferior vena cava is normal in size with greater than 50%  respiratory  variability, suggesting right atrial pressure of 3 mmHg.   8. Agitated saline contrast bubble study was negative, with no evidence  of any interatrial shunt.   Conclusion(s)/Recommendation(s): Normal biventricular function without  evidence of hemodynamically significant valvular heart disease.   FINDINGS   Left Ventricle: Left ventricular ejection fraction, by estimation, is 60  to 65%. The left ventricle has normal function. The left ventricle has no  regional wall motion abnormalities. The left ventricular internal cavity  size was normal in size. There is   no left ventricular hypertrophy.  __________   2D echo 01/26/2023: 1. Left ventricular ejection fraction, by estimation, is 55 to 60%. The  left ventricle has normal function. The left ventricle has no regional  wall motion abnormalities. Left ventricular diastolic parameters are  indeterminate. The average left  ventricular global longitudinal strain is -19.0 %.   2. Right ventricular systolic function is normal. The right ventricular  size is normal. There is normal pulmonary artery systolic pressure. The  estimated right ventricular systolic pressure is 28.8 mmHg.   3. The mitral valve is normal in structure. Moderate to severe mitral  valve regurgitation. No evidence of mitral stenosis. There is mild late  systolic prolapse of multiple segments of the anterior leaflet of the  mitral valve.   4. Tricuspid valve regurgitation is mild to moderate.   5. The aortic valve is normal in structure. Aortic valve regurgitation is  not visualized. No aortic stenosis is present.   6. The inferior vena cava is normal in size with greater than 50%  respiratory variability, suggesting right atrial pressure of 3 mmHg.  __________   Luci Bank patch 12/2022: Patient had a min HR of 56 bpm, max HR of 214 bpm, and avg HR of 84 bpm. Predominant underlying rhythm was Sinus Rhythm. 106 Supraventricular Tachycardia runs occurred, the run with the  fastest interval lasting 6 beats with a max rate of 214 bpm, the longest lasting 2 mins 10 secs with an avg rate of 146 bpm. Atrial Fibrillation/Flutter occurred (1% burden), ranging from 119-213 bpm (avg of 153 bpm), the longest lasting 2 hours 4 mins with an avg rate of 152 bpm.  Occasional PACs and rare PVCs. __________   Coronary CTA 04/07/2022: FINDINGS: Aorta: Normal size. Mild aortic root and descending aorta calcifications. No dissection.   Aortic Valve:  Trileaflet.  No calcifications.   Coronary Arteries:  Normal coronary origin.  Right dominance.   RCA is a dominant artery that gives rise to PDA and PLA. There is calcified plaque proximally causing minimal stenosis (<25%).   Left main gives rise to LAD and LCX arteries.  LM has no stenosis.   LAD has no plaque.   LCX is a non-dominant artery that gives rise to two obtuse marginal branches. There is calcified plaque proximally causing mild stenosis (25-49%).   Other findings:   Normal pulmonary vein drainage into the left atrium.   Normal left atrial appendage without a thrombus.   Normal size of the pulmonary artery.   IMPRESSION: 1. Coronary calcium score of 95.3. This was 59th percentile for age and sex matched control. 2. Normal coronary origin with right dominance. 3. Mild proximal LCx stenosis (25%). 4. Minimal proximal RCA stenosis (<25%). 5. CAD-RADS 2. Mild non-obstructive CAD (  25-49%). Consider non-atherosclerotic causes of chest pain. Consider preventive therapy and risk factor modification. 6. Image quality degraded by motion artifacts.   Noncardiac overread: IMPRESSION: 1. No acute extracardiac findings. 2. Tree-in-bud nodules which were present on most recent prior chest CT have resolved, likely sequela of infection or aspiration. 3. Solid pulmonary nodules of the right lower lobe are unchanged in size when compared with prior lung cancer screening CT chest CT dated July 16, 2021. Recommend  attention on annual lung cancer screening CT. 4. Aortic Atherosclerosis and Emphysema. __________   2D echo 01/18/2022 Gavin Potters): MILD LV SYSTOLIC DYSFUNCTION (See above)  NORMAL RIGHT VENTRICULAR SYSTOLIC FUNCTION  NO VALVULAR STENOSIS  MODERATE MR  MILD TR, PR  EF 45%  __________   Eugenie Birks MPI 02/24/2020 Gavin Potters): Normal myocardial perfusion scan no evidence of stress-induced  medical ischemia ejection fraction of 64% conclusion negative scan  __________   2D echo 02/24/2020 Gavin Potters): NORMAL LEFT VENTRICULAR SYSTOLIC FUNCTION WITH AN ESTIMATED EF = >55 %  NORMAL RIGHT VENTRICULAR SYSTOLIC FUNCTION  MODERATE MITRAL VALVE INSUFFICIENCY  MILD-TO-MODERATE TRICUSPID VALVE INSUFFICIENCY  NO VALVULAR STENOSIS   Patient Profile     78 y.o. female with h/o nonobstructive CAD by coronary CTA in 04/2022, HFimpEF, PAF diagnosed in 09/2022, moderate MR, HTN, HLD, bipolar disorder, and COPD with prior tobacco use quitting in 2023 who is being seen for atrial flutter with RVR.   Assessment & Plan    Acute hypoxic and hypercarbic respiratory failure - multifactorial including COPD exacerbation with possible PNA, now with aflutter RVR - pulmonary issues per primary service   Newly diagnosed Atrial flutter with RVR with h/o PAF - patient noted to be in NSR during ER visit 1/16 and continue SR upon admission on 1/18 with develop ment of aflutter 2-1 AV block with rates in the 140s on the overnight hours going into 1/19. - suspect aflutter secondary to pulmonary issues, hypokalemia, steroids and nebulizers - started on lopressor 12.5mg  Q6H and IV amiodarone with conversion to SB 1/19 around 5:30pm - continue Eliquis 5mg  BID - CHADSVASC at least 6 - remains in NSR with HR in the 70s. 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

## 2023-06-27 NOTE — Progress Notes (Addendum)
PROGRESS NOTE    Taylor Chambers  BJY:[id number] DOB: 10/25/1945 DOA: 06/24/2023 PCP: Mick Sell, MD  Outpatient Specialists: cardiology, pulmonology    Brief Narrative:   From admission h and p Taylor Chambers is a 78 y.o. female with medical history significant of COPD, CAD, atrial fibrillation, HFpEF, mitral insufficiency, hyperlipidemia, hypertension, tobacco abuse presenting with acute respiratory failure with hypoxia, acute on chronic HFpEF, COPD exacerbation, pneumonia, sepsis.  Patient reports increased work of breathing over the past 2 to 3 days.  Positive cough wheezing and sputum production.  Minimal malaise.  Mild orthopnea.  No reported fevers or chills.  Previously smoking 1 pack/day.  Patient states she no longer smokes.  No abdominal pain or diarrhea.  Symptoms have progressively worsened over this timeframe.  Baseline atrial fibrillation and CAD followed by [facility] outpatient.  Has been compliant with medication regimen per report.    Assessment & Plan:   Principal Problem:   Acute respiratory failure with hypoxia (HCC) Active Problems:   Sepsis (HCC)   Tobacco use disorder   Benign essential hypertension   Malnutrition of moderate degree   Paroxysmal atrial fibrillation with RVR (HCC)   Mitral valve insufficiency   Hyperglycemia   COPD (chronic obstructive pulmonary disease) (HCC)   # Acute hypercarbic respiratory failure on chronic hypoxic respiratory failure Altered and hypercarbic on arrival requiring bipap, then weaned off and stable on home 2 liters. Then yesterday morning dyspneic and confused and hypercarbic, again required bipap, now weaned off and stable - secure chat w/ dr. Richardson Dopp today regarding home bipap, he responded saying he would "check it out," would f/u with Dr. Richardson Dopp tomorrow regarding his recs  # COPD With acute exacerbation, though suspect a-fib/chf to be playing a greater role. Covid/flu/rsv/rvp neg. No fever and normal procal so  think less likely underlying bacterial pneumonia, though CTA (no PE) does show dependent lung opacities - sputum for culture (not producing any currently) - continue steroids, will transition to orals tomorrow - ceftriaxone/azithromycin broadened to cefepime on 1/20, would plan on continuing pseudomonal coverage when transitioned to orals - scheduled duonebs  # A-fib with RVR Reports several weeks fatigue, exertional dyspnea, most likely mediated by rvr. Didn't resolve with oral and iv BB. Not hyperthyroid - cardiology consulted - cont apixaban - amio gtt transitioned to oral today, continue oral metop  # HFpEF with acute exacerbation # Moderate mitral regurg Recent TEE with preserved EF, moderate MR. Likely mediated by a-fib. No chest pain and mild stable trop elevation, doubt acs - pause lasix given rise in cr and bun - cardiology following  # Pleural effusions Appear to be new, suspect 2/2 a-fib. Discussed w/ pulm, they are small and not amenable to drainage - monitor, treat a-fib as above  # Hypokalemia resolved - monitor  # HTN Soft BPs in setting of above - holding home antihypertensives  # GAD - home sertraline  # Debility PT advising home health   DVT prophylaxis: therapeutic apixaban Code Status: full Family Communication: husband updated @ bedside 1/21  Level of care: Telemetry Medical Status is: Inpatient Remains inpatient appropriate because: severity of illness    Consultants:  Cardiology, pccm  Procedures: none    Subjective: Thinks breathing is back more or less to baseline  Objective: Vitals:   06/27/23 0500 06/27/23 0554 06/27/23 0600 06/27/23 0800  BP: 124/69 124/69 129/73 113/71  Pulse: 79 77 81 84  Resp: (!) 29  (!) 23 19  Temp:    98.2 F (  36.8 C)  TempSrc:      SpO2: 99%  99% 97%  Weight:      Height:        Intake/Output Summary (Last 24 hours) at 06/27/2023 1221 Last data filed at 06/27/2023 0900 Gross per 24 hour   Intake 939.87 ml  Output 650 ml  Net 289.87 ml   Filed Weights   06/25/23 0832  Weight: 50.4 kg    Examination:  General exam: Appears calm and comfortable  Respiratory system: decreased at bases, scattered rhonchi. Better air entry Cardiovascular system: tachycardic, systolic murmur Gastrointestinal system: Abdomen is nondistended, soft and nontender. No organomegaly or masses felt. Normal bowel sounds heard. Central nervous system: Alert and oriented. No focal neurological deficits. Extremities: Symmetric 5 x 5 power. No edema Skin: No rashes, lesions or ulcers Psychiatry: Judgement and insight appear normal. Mood & affect appropriate.     Data Reviewed: I have personally reviewed following labs and imaging studies  CBC: Recent Labs  Lab 06/22/23 0944 06/24/23 0534 06/25/23 0436 06/26/23 0900 06/27/23 0414  WBC 10.5 18.5* 21.0* 25.4* 19.8*  NEUTROABS  --  14.0*  --   --   --   HGB 11.7* 12.5 11.4* 12.6 12.0  HCT 36.0 39.9 34.2* 40.2 36.9  MCV 105.9* 108.7* 102.7* 109.5* 103.7*  PLT 339 444* 385 478* 378   Basic Metabolic Panel: Recent Labs  Lab 06/22/23 0944 06/24/23 0534 06/25/23 0435 06/25/23 0436 06/25/23 1409 06/26/23 0243 06/27/23 0414  NA 145 142  --  138  --  137 139  K 4.0 4.0  --  2.9* 4.0 4.0 3.6  CL 107 103  --  98  --  96* 100  CO2 25 24  --  26  --  26 26  GLUCOSE 125* 349*  --  106*  --  176* 225*  BUN 23 29*  --  22  --  33* 40*  CREATININE 0.89 0.87  --  0.67  --  1.05* 1.08*  CALCIUM 9.2 8.6*  --  8.0*  --  8.2* 7.9*  MG  --   --  2.2  --   --   --   --    GFR: Estimated Creatinine Clearance: 35 mL/min (A) (by C-G formula based on SCr of 1.08 mg/dL (H)). Liver Function Tests: Recent Labs  Lab 06/22/23 0944 06/24/23 0534 06/25/23 0436  AST 72* 97* 42*  ALT 57* 86* 61*  ALKPHOS 63 68 63  BILITOT 0.9 0.8 1.0  PROT 6.3* 6.4* 6.0*  ALBUMIN 3.4* 3.4* 3.4*   No results for input(s): "LIPASE", "AMYLASE" in the last 168 hours. No  results for input(s): "AMMONIA" in the last 168 hours. Coagulation Profile: Recent Labs  Lab 06/24/23 0534  INR 1.4*   Cardiac Enzymes: No results for input(s): "CKTOTAL", "CKMB", "CKMBINDEX", "TROPONINI" in the last 168 hours. BNP (last 3 results) No results for input(s): "PROBNP" in the last 8760 hours. HbA1C: Recent Labs    06/25/23 0436  HGBA1C 5.6   CBG: Recent Labs  Lab 06/26/23 1607 06/26/23 1928 06/26/23 2154 06/27/23 0720 06/27/23 1138  GLUCAP 122* 118* 112* 179* 142*   Lipid Profile: No results for input(s): "CHOL", "HDL", "LDLCALC", "TRIG", "CHOLHDL", "LDLDIRECT" in the last 72 hours. Thyroid Function Tests: No results for input(s): "TSH", "T4TOTAL", "FREET4", "T3FREE", "THYROIDAB" in the last 72 hours.  Anemia Panel: No results for input(s): "VITAMINB12", "FOLATE", "FERRITIN", "TIBC", "IRON", "RETICCTPCT" in the last 72 hours. Urine analysis:    Component  Value Date/Time   COLORURINE YELLOW (A) 10/28/2021 1425   APPEARANCEUR HAZY (A) 10/28/2021 1425   LABSPEC 1.026 10/28/2021 1425   PHURINE 5.0 10/28/2021 1425   GLUCOSEU NEGATIVE 10/28/2021 1425   HGBUR NEGATIVE 10/28/2021 1425   BILIRUBINUR NEGATIVE 10/28/2021 1425   KETONESUR 5 (A) 10/28/2021 1425   PROTEINUR 30 (A) 10/28/2021 1425   NITRITE NEGATIVE 10/28/2021 1425   LEUKOCYTESUR NEGATIVE 10/28/2021 1425   Sepsis Labs: @LABRCNTIP (procalcitonin:4,lacticidven:4)  ) Recent Results (from the past 240 hours)  Blood culture (routine x 2)     Status: None (Preliminary result)   Collection Time: 06/24/23  5:34 AM   Specimen: BLOOD  Result Value Ref Range Status   Specimen Description BLOOD BLOOD RIGHT ARM  Final   Special Requests   Final    BOTTLES DRAWN AEROBIC AND ANAEROBIC Blood Culture results may not be optimal due to an inadequate volume of blood received in culture bottles   Culture   Final    NO GROWTH 3 DAYS Performed at [facility], [address] [place], [place]  [address]    Report Status PENDING  Incomplete  Blood culture (routine x 2)     Status: None (Preliminary result)   Collection Time: 06/24/23  5:43 AM   Specimen: BLOOD  Result Value Ref Range Status   Specimen Description BLOOD BLOOD RIGHT FOREARM  Final   Special Requests   Final    BOTTLES DRAWN AEROBIC ONLY Blood Culture results may not be optimal due to an inadequate volume of blood received in culture bottles   Culture   Final    NO GROWTH 3 DAYS Performed at [facility], [address], [place], [place] [address]    Report Status PENDING  Incomplete  Resp panel by RT-PCR (RSV, Flu A&B, Covid) Anterior Nasal Swab     Status: None   Collection Time: 06/24/23  5:43 AM   Specimen: Anterior Nasal Swab  Result Value Ref Range Status   SARS Coronavirus 2 by RT PCR NEGATIVE NEGATIVE Final    Comment: (NOTE) SARS-CoV-2 target nucleic acids are NOT DETECTED.  The SARS-CoV-2 RNA is generally detectable in upper respiratory specimens during the acute phase of infection. The lowest concentration of SARS-CoV-2 viral copies this assay can detect is 138 copies/mL. A negative result does not preclude SARS-Cov-2 infection and should not be used as the sole basis for treatment or other patient management decisions. A negative result may occur with  improper specimen collection/handling, submission of specimen other than nasopharyngeal swab, presence of viral mutation(s) within the areas targeted by this assay, and inadequate number of viral copies(<138 copies/mL). A negative result must be combined with clinical observations, patient history, and epidemiological information. The expected result is Negative.  Fact Sheet for Patients:  BloggerCourse.com  Fact Sheet for Healthcare Providers:  SeriousBroker.it  This test is no t yet approved or cleared by the [place] FDA and  has been authorized for detection and/or diagnosis of  SARS-CoV-2 by FDA under an Emergency Use Authorization (EUA). This EUA will remain  in effect (meaning this test can be used) for the duration of the COVID-19 declaration under Section 564(b)(1) of the Act, 21 U.S.C.section 360bbb-3(b)(1), unless the authorization is terminated  or revoked sooner.       Influenza A by PCR NEGATIVE NEGATIVE Final   Influenza B by PCR NEGATIVE NEGATIVE Final    Comment: (NOTE) The Xpert Xpress SARS-CoV-2/FLU/RSV plus assay is intended as an aid in the  diagnosis of influenza from Nasopharyngeal swab specimens and should not be used as a sole basis for treatment. Nasal washings and aspirates are unacceptable for Xpert Xpress SARS-CoV-2/FLU/RSV testing.  Fact Sheet for Patients: BloggerCourse.com  Fact Sheet for Healthcare Providers: SeriousBroker.it  This test is not yet approved or cleared by the [place] FDA and has been authorized for detection and/or diagnosis of SARS-CoV-2 by FDA under an Emergency Use Authorization (EUA). This EUA will remain in effect (meaning this test can be used) for the duration of the COVID-19 declaration under Section 564(b)(1) of the Act, 21 U.S.C. section 360bbb-3(b)(1), unless the authorization is terminated or revoked.     Resp Syncytial Virus by PCR NEGATIVE NEGATIVE Final    Comment: (NOTE) Fact Sheet for Patients: BloggerCourse.com  Fact Sheet for Healthcare Providers: SeriousBroker.it  This test is not yet approved or cleared by the [place] FDA and has been authorized for detection and/or diagnosis of SARS-CoV-2 by FDA under an Emergency Use Authorization (EUA). This EUA will remain in effect (meaning this test can be used) for the duration of the COVID-19 declaration under Section 564(b)(1) of the Act, 21 U.S.C. section 360bbb-3(b)(1), unless the authorization is terminated  or revoked.  Performed at [facility], [address] [place], [place] [address]   MRSA Next Gen by PCR, Nasal     Status: None   Collection Time: 06/25/23  8:34 AM   Specimen: Nasal Mucosa; Nasal Swab  Result Value Ref Range Status   MRSA by PCR Next Gen NOT DETECTED NOT DETECTED Final    Comment: (NOTE) The GeneXpert MRSA Assay (FDA approved for NASAL specimens only), is one component of a comprehensive MRSA colonization surveillance program. It is not intended to diagnose MRSA infection nor to guide or monitor treatment for MRSA infections. Test performance is not FDA approved in patients less than 60 years old. Performed at [facility], [address] [place], [place] [address]   Respiratory (~20 pathogens) panel by PCR     Status: None   Collection Time: 06/25/23  9:24 AM   Specimen: Nasopharyngeal Swab; Respiratory  Result Value Ref Range Status   Adenovirus NOT DETECTED NOT DETECTED Final   Coronavirus 229E NOT DETECTED NOT DETECTED Final    Comment: (NOTE) The Coronavirus on the Respiratory Panel, DOES NOT test for the novel  Coronavirus (2019 nCoV)    Coronavirus HKU1 NOT DETECTED NOT DETECTED Final   Coronavirus NL63 NOT DETECTED NOT DETECTED Final   Coronavirus OC43 NOT DETECTED NOT DETECTED Final   Metapneumovirus NOT DETECTED NOT DETECTED Final   Rhinovirus / Enterovirus NOT DETECTED NOT DETECTED Final   Influenza A NOT DETECTED NOT DETECTED Final   Influenza B NOT DETECTED NOT DETECTED Final   Parainfluenza Virus 1 NOT DETECTED NOT DETECTED Final   Parainfluenza Virus 2 NOT DETECTED NOT DETECTED Final   Parainfluenza Virus 3 NOT DETECTED NOT DETECTED Final   Parainfluenza Virus 4 NOT DETECTED NOT DETECTED Final   Respiratory Syncytial Virus NOT DETECTED NOT DETECTED Final   Bordetella pertussis NOT DETECTED NOT DETECTED Final   Bordetella Parapertussis NOT DETECTED NOT DETECTED Final   Chlamydophila pneumoniae NOT DETECTED NOT DETECTED  Final   Mycoplasma pneumoniae NOT DETECTED NOT DETECTED Final    Comment: Performed at [facility], [address], [place], [place] [address]         Radiology Studies: ECHOCARDIOGRAM COMPLETE Result Date: 06/27/2023    ECHOCARDIOGRAM REPORT   Patient Name:  Taylor Chambers Date of Exam: 06/26/2023 Medical Rec #:  [id number]   Height:       62.0 in Accession #:    [id number]  Weight:       111.1 lb Date of Birth:  13-Nov-1945   BSA:          1.489 m Patient Age:    77 years    BP:           146/88 mmHg Patient Gender: F           HR:           76 bpm. Exam Location:  [facility] Procedure: 2D Echo, Cardiac Doppler and Color Doppler Indications:     Elevated Troponin  History:         Patient has prior history of Echocardiogram examinations, most                  recent 01/26/2023. COPD, Signs/Symptoms:Dyspnea; Risk                  Factors:Hypertension, Dyslipidemia and Former Smoker.                  Emphysema.  Sonographer:     Daphine Deutscher RDCS Referring Phys:  [id number] Francee Nodal FURTH Diagnosing Phys: Cristal Deer End MD IMPRESSIONS  1. Left ventricular ejection fraction, by estimation, is 55 to 60%. The left ventricle has normal function. The left ventricle has no regional wall motion abnormalities. Left ventricular diastolic parameters are consistent with Grade II diastolic dysfunction (pseudonormalization).  2. Right ventricular systolic function is normal. The right ventricular size is normal. There is moderately elevated pulmonary artery systolic pressure.  3. The mitral valve is degenerative. Moderate to severe mitral valve regurgitation. No evidence of mitral stenosis.  4. Tricuspid valve regurgitation is mild to moderate.  5. The aortic valve is tricuspid. There is mild thickening of the aortic valve. Aortic valve regurgitation is trivial. Aortic valve sclerosis/calcification is present, without any evidence of aortic stenosis.  6. The inferior vena cava is normal in size with <50%  respiratory variability, suggesting right atrial pressure of 8 mmHg. FINDINGS  Left Ventricle: Left ventricular ejection fraction, by estimation, is 55 to 60%. The left ventricle has normal function. The left ventricle has no regional wall motion abnormalities. The left ventricular internal cavity size was normal in size. There is  no left ventricular hypertrophy. Left ventricular diastolic parameters are consistent with Grade II diastolic dysfunction (pseudonormalization). Right Ventricle: The right ventricular size is normal. No increase in right ventricular wall thickness. Right ventricular systolic function is normal. There is moderately elevated pulmonary artery systolic pressure. The tricuspid regurgitant velocity is 3.36 m/s, and with an assumed right atrial pressure of 8 mmHg, the estimated right ventricular systolic pressure is 53.2 mmHg. Left Atrium: Left atrial size was normal in size. Right Atrium: Right atrial size was normal in size. Pericardium: There is no evidence of pericardial effusion. Mitral Valve: The mitral valve is degenerative in appearance. There is moderate thickening of the mitral valve leaflet(s). Moderate to severe mitral valve regurgitation. No evidence of mitral valve stenosis. Tricuspid Valve: The tricuspid valve is normal in structure. Tricuspid valve regurgitation is mild to moderate. Aortic Valve: The aortic valve is tricuspid. There is mild thickening of the aortic valve. There is mild to moderate aortic valve annular calcification. Aortic valve regurgitation is trivial. Aortic valve sclerosis/calcification is present, without any evidence of aortic stenosis. Aortic valve mean gradient measures 5.6 mmHg.  Aortic valve peak gradient measures 10.3 mmHg. Aortic valve area, by VTI measures 0.78 cm. Pulmonic Valve: The pulmonic valve was grossly normal. Pulmonic valve regurgitation is trivial. No evidence of pulmonic stenosis. Aorta: The aortic root is normal in size and structure.  Pulmonary Artery: The pulmonary artery is not well seen. Venous: The inferior vena cava is normal in size with less than 50% respiratory variability, suggesting right atrial pressure of 8 mmHg. IAS/Shunts: No atrial level shunt detected by color flow Doppler.  LEFT VENTRICLE PLAX 2D LVIDd:         4.60 cm   Diastology LVIDs:         3.00 cm   LV e' medial:    7.58 cm/s LV PW:         0.50 cm   LV E/e' medial:  14.1 LV IVS:        0.50 cm   LV e' lateral:   7.51 cm/s LVOT diam:     1.50 cm   LV E/e' lateral: 14.3 LV SV:         24 LV SV Index:   16 LVOT Area:     1.77 cm  RIGHT VENTRICLE             IVC RV Basal diam:  3.20 cm     IVC diam: 1.70 cm RV S prime:     12.67 cm/s TAPSE (M-mode): 3.0 cm LEFT ATRIUM           Index        RIGHT ATRIUM          Index LA diam:      4.10 cm 2.75 cm/m   RA Area:     9.39 cm LA Vol (A2C): 44.6 ml 29.95 ml/m  RA Volume:   19.10 ml 12.83 ml/m LA Vol (A4C): 28.5 ml 19.14 ml/m  AORTIC VALVE AV Area (Vmax):    0.74 cm AV Area (Vmean):   0.71 cm AV Area (VTI):     0.78 cm AV Vmax:           160.47 cm/s AV Vmean:          113.058 cm/s AV VTI:            0.303 m AV Peak Grad:      10.3 mmHg AV Mean Grad:      5.6 mmHg LVOT Vmax:         67.10 cm/s LVOT Vmean:        45.267 cm/s LVOT VTI:          0.134 m LVOT/AV VTI ratio: 0.44  AORTA Ao Root diam: 2.40 cm MITRAL VALVE                TRICUSPID VALVE MV Area (PHT): 4.70 cm     TR Peak grad:   45.2 mmHg MV Decel Time: 162 msec     TR Vmax:        336.00 cm/s MR Peak grad: 85.4 mmHg MR Mean grad: 65.5 mmHg     SHUNTS MR Vmax:      462.00 cm/s   Systemic VTI:  0.13 m MR Vmean:     395.5 cm/s    Systemic Diam: 1.50 cm MV E velocity: 107.00 cm/s MV A velocity: 70.60 cm/s MV E/A ratio:  1.52 Cristal Deer End MD Electronically signed by Yvonne Kendall MD Signature Date/Time: 06/27/2023/7:23:05 AM    Final    DG Chest Port 1 View Result  Date: 06/26/2023 CLINICAL DATA:  Dyspnea.  History of COPD. EXAM: PORTABLE CHEST 1 VIEW  COMPARISON:  Chest radiograph and CTA chest dated June 24, 2023. FINDINGS: Stable cardiomediastinal silhouette. Aortic atherosclerosis. Hyperinflation with emphysematous changes, compatible with COPD. Persistent small bilateral pleural effusions with basilar atelectasis. Similar left-sided basilar predominant interstitial changes. No pneumothorax. The visualized osseous structures are unchanged. IMPRESSION: 1. Persistent small bilateral pleural effusions with basilar atelectasis. 2. Similar left basilar predominant interstitial opacities, could reflect a combination of infiltrate, asymmetric edema, and/or atelectasis. Electronically Signed   By: Hart Robinsons M.D.   On: 06/26/2023 09:23        Scheduled Meds:  amiodarone  400 mg Oral BID   Followed by   Melene Muller ON 07/04/2023] amiodarone  200 mg Oral BID   Followed by   Melene Muller ON 07/11/2023] amiodarone  200 mg Oral Daily   apixaban  5 mg Oral BID   budesonide (PULMICORT) nebulizer solution  0.5 mg Nebulization BID   Chlorhexidine Gluconate Cloth  6 each Topical Daily   feeding supplement  237 mL Oral BID BM   insulin aspart  0-6 Units Subcutaneous TID WC   ipratropium-albuterol  3 mL Nebulization TID   methylPREDNISolone (SOLU-MEDROL) injection  20 mg Intravenous Q12H   metoprolol succinate  50 mg Oral Daily   sertraline  100 mg Oral Daily   Continuous Infusions:  ceFEPime (MAXIPIME) IV 2 g (06/27/23 0911)     LOS: 3 days     Silvano Bilis, MD [facility]   If 7PM-7AM, please contact night-coverage [email or web address] Password [facility] 06/27/2023, 12:21 PM

## 2023-06-27 NOTE — Evaluation (Signed)
Occupational Therapy Evaluation Patient Details Name: Taylor Chambers MRN: [id number] DOB: [birth year] Today's Date: 06/27/2023   History of Present Illness Patient is a 78 year old female presenting with acute respiratory failure with hypoxia,  acute on chronic HFpEF, COPD exacerbation, pneumonia, sepsis. History of  COPD, CAD, atrial fibrillation, HFpEF, mitral insufficiency, hyperlipidemia, hypertension   Clinical Impression   [name] was seen for OT/PT co-evaluation this date. Prior to hospital admission, pt was generally independent with ADL/IADL management. She endorses intermittently using her home O2 and AE for ADL management. Supportive spouse at bedside reports pt has had 2 falls in last 6 months that have caused increased anxiety surrounding safety with ADL management at home. Pt lives in a multi-level home with her spouse available to provide 24/7 assist. Pt presents to acute OT demonstrating impaired ADL performance and functional mobility 2/2 decreased activity tolerance, limited cardiopulmonary status, and decreased safety awareness (See OT problem list for additional functional deficits). Pt currently requires MIN A For LB ADL Management from STS.  Pt would benefit from skilled OT services to address noted impairments and functional limitations (see below for any additional details) in order to maximize safety and independence while minimizing falls risk and caregiver burden. Anticipate the need for follow up OT services in the home setting upon acute hospital DC.        If plan is discharge home, recommend the following: A little help with walking and/or transfers;A little help with bathing/dressing/bathroom;Help with stairs or ramp for entrance;Assist for transportation;Assistance with cooking/housework    Functional Status Assessment  Patient has had a recent decline in their functional status and demonstrates the ability to make significant improvements in function in a reasonable  and predictable amount of time.  Equipment Recommendations  None recommended by OT (Pt has necessary equipment)    Recommendations for Other Services       Precautions / Restrictions Precautions Precautions: Fall Restrictions Weight Bearing Restrictions Per Provider Order: No      Mobility Bed Mobility Overal bed mobility: Needs Assistance Bed Mobility: Supine to Sit     Supine to sit: Contact guard, HOB elevated     General bed mobility comments: increased time required    Transfers Overall transfer level: Needs assistance Equipment used: Rolling walker (2 wheels) Transfers: Sit to/from Stand Sit to Stand: Min assist           General transfer comment: steadying assistance provided. cues for hand placement      Balance Overall balance assessment: Needs assistance Sitting-balance support: Feet supported, No upper extremity supported Sitting balance-Leahy Scale: Fair Sitting balance - Comments: fatigues quickly   Standing balance support: Reliant on assistive device for balance, During functional activity, Bilateral upper extremity supported, Single extremity supported Standing balance-Leahy Scale: Fair                             ADL either performed or assessed with clinical judgement   ADL Overall ADL's : Needs assistance/impaired                                       General ADL Comments: SUPERVISION to don bilat socks at bed level, anticipate MIN A for LB ADL management from STS 2/2 increased exertional effort required for LB dressing and bathing tasks. Initial MIN A for stability in standing, but pt is able  to progress to SUPERVISION level for functional mobility at household distance in her ICU room.     Vision Patient Visual Report: No change from baseline       Perception         Praxis         Pertinent Vitals/Pain Pain Assessment Pain Assessment: No/denies pain     Extremity/Trunk Assessment Upper  Extremity Assessment Upper Extremity Assessment: Generalized weakness   Lower Extremity Assessment Lower Extremity Assessment: Generalized weakness       Communication Communication Communication: No apparent difficulties Cueing Techniques: Verbal cues   Cognition Arousal: Alert Behavior During Therapy: WFL for tasks assessed/performed Overall Cognitive Status: Within Functional Limits for tasks assessed                                 General Comments: Mild anxiety reported by pt, RN pre-medicated prior to session.     General Comments  patient is fatigued with activity    Exercises Other Exercises Other Exercises: Pt educated on safety, falls prevention, and energy conservation strategies to maximzie safety and functional independence during ADL management.   Shoulder Instructions      Home Living Family/patient expects to be discharged to:: Private residence Living Arrangements: Spouse/significant other Available Help at Discharge: Family;Available 24 hours/day Type of Home: House Home Access: Stairs to enter [employer] of Steps: 18 steps, has a Theatre manager.   Home Layout: Two level;Able to live on main level with bedroom/bathroom     Bathroom Shower/Tub: Producer, television/film/video: Standard     Home Equipment: Shower seat;Grab bars - tub/shower;Hand held Programmer, systems (2 wheels);BSC/3in1          Prior Functioning/Environment Prior Level of Function : Independent/Modified Independent             Mobility Comments: Walking independently but has had 2 falls in last 6 months ADLs Comments: Independent with ADLs. Wears O2 PRN at home.        OT Problem List: Decreased strength;Decreased coordination;Decreased activity tolerance;Decreased safety awareness;Impaired balance (sitting and/or standing);Decreased knowledge of use of DME or AE      OT Treatment/Interventions: Self-care/ADL training;Therapeutic  exercise;Therapeutic activities;DME and/or AE instruction;Patient/family education;Energy conservation;Balance training    OT Goals(Current goals can be found in the care plan section) Acute Rehab OT Goals Patient Stated Goal: To go home OT Goal Formulation: With patient Time For Goal Achievement: 07/11/23 Potential to Achieve Goals: Good ADL Goals Pt Will Perform Grooming: sitting;with modified independence Pt Will Perform Lower Body Dressing: sit to/from stand;with adaptive equipment;with modified independence Pt Will Transfer to Toilet: ambulating;with modified independence Pt Will Perform Toileting - Clothing Manipulation and hygiene: with modified independence;with adaptive equipment;sit to/from stand  OT Frequency: Min 1X/week    Co-evaluation PT/OT/SLP Co-Evaluation/Treatment: Yes Reason for Co-Treatment: Complexity of the patient's impairments (multi-system involvement) PT goals addressed during session: Mobility/safety with mobility OT goals addressed during session: ADL's and self-care;Proper use of Adaptive equipment and DME      AM-PAC OT "6 Clicks" Daily Activity     Outcome Measure Help from another person eating meals?: None Help from another person taking care of personal grooming?: A Little Help from another person toileting, which includes using toliet, bedpan, or urinal?: A Little Help from another person bathing (including washing, rinsing, drying)?: A Little Help from another person to put on and taking off regular upper body clothing?: A Little  Help from another person to put on and taking off regular lower body clothing?: A Little 6 Click Score: 19   End of Session Equipment Utilized During Treatment: Gait belt;Rolling walker (2 wheels);Oxygen Nurse Communication: Mobility status  Activity Tolerance: Patient tolerated treatment well Patient left: in chair;with call bell/phone within reach;with family/visitor present  OT Visit Diagnosis: Other abnormalities  of gait and mobility (R26.89);Muscle weakness (generalized) (M62.81)                Time: 1191-4782 OT Time Calculation (min): 32 min Charges:  OT General Charges $OT Visit: 1 Visit OT Evaluation $OT Eval Moderate Complexity: 1 Mod OT Treatments $Self Care/Home Management : 8-22 mins  Rockney Ghee, M.S., OTR/L 06/27/23, 12:13 PM

## 2023-06-28 DIAGNOSIS — I480 Paroxysmal atrial fibrillation: Secondary | ICD-10-CM | POA: Diagnosis not present

## 2023-06-28 DIAGNOSIS — F17200 Nicotine dependence, unspecified, uncomplicated: Secondary | ICD-10-CM | POA: Diagnosis not present

## 2023-06-28 DIAGNOSIS — J9602 Acute respiratory failure with hypercapnia: Secondary | ICD-10-CM | POA: Diagnosis not present

## 2023-06-28 DIAGNOSIS — E440 Moderate protein-calorie malnutrition: Secondary | ICD-10-CM

## 2023-06-28 DIAGNOSIS — J441 Chronic obstructive pulmonary disease with (acute) exacerbation: Secondary | ICD-10-CM | POA: Diagnosis not present

## 2023-06-28 LAB — BASIC METABOLIC PANEL
Anion gap: 9 (ref 5–15)
BUN: 29 mg/dL — ABNORMAL HIGH (ref 8–23)
CO2: 27 mmol/L (ref 22–32)
Calcium: 8.3 mg/dL — ABNORMAL LOW (ref 8.9–10.3)
Chloride: 105 mmol/L (ref 98–111)
Creatinine, Ser: 0.77 mg/dL (ref 0.44–1.00)
GFR, Estimated: >60 mL/min (ref >60)
Glucose, Bld: 156 mg/dL — ABNORMAL HIGH (ref 70–99)
Potassium: 3.8 mmol/L (ref 3.5–5.1)
Sodium: 141 mmol/L (ref 135–145)

## 2023-06-28 LAB — BLOOD GAS, VENOUS
Bicarbonate: 27.2 mmol/L (ref 20.0–28.0)
O2 Saturation: 25.2 mmol/L (ref 0.0–2.0)
Patient temperature: 37
Patient temperature: 37 %
pCO2, Ven: 62 mm[Hg] — ABNORMAL HIGH (ref 44–60)
pH, Ven: 7.25 (ref 7.25–7.43)
pO2, Ven: 27.2 mmol/L (ref 32–45)

## 2023-06-28 LAB — GLUCOSE, CAPILLARY
Glucose-Capillary: 128 mg/dL — ABNORMAL HIGH (ref 70–99)
Glucose-Capillary: 137 mg/dL — ABNORMAL HIGH (ref 70–99)

## 2023-06-28 MED ORDER — LEVOFLOXACIN 750 MG PO TABS
750.0000 mg | ORAL_TABLET | Freq: Once | ORAL | Status: AC
  Administered 2023-06-28: 750 mg via ORAL
  Filled 2023-06-28 (×2): qty 1

## 2023-06-28 MED ORDER — METOPROLOL SUCCINATE ER 50 MG PO TB24
50.0000 mg | ORAL_TABLET | Freq: Two times a day (BID) | ORAL | Status: DC
  Administered 2023-06-28: 50 mg via ORAL
  Filled 2023-06-28: qty 1

## 2023-06-28 MED ORDER — METOPROLOL SUCCINATE ER 50 MG PO TB24: 50.0000 mg | ORAL_TABLET | Freq: Two times a day (BID) | ORAL | 3 refills | Status: DC

## 2023-06-28 MED ORDER — PREDNISONE 10 MG PO TABS: ORAL_TABLET | ORAL | 0 refills | Status: DC

## 2023-06-28 MED ORDER — AMIODARONE HCL 200 MG PO TABS: ORAL_TABLET | ORAL | 1 refills | Status: DC

## 2023-06-28 NOTE — Progress Notes (Signed)
Rounding Note    Patient Name: Taylor Chambers Date of Encounter: 06/28/2023  [facility] Cardiologist: Lorine Bears, MD   Subjective   Patient had episode of afib lasting 15 minutes last night that broke with extra Toprol. Otherwise, she is in NSR. She denies chest pain or SOB. She is overall feeling OK.   Inpatient Medications    Scheduled Meds:  amiodarone  400 mg Oral BID   Followed by   Melene Muller ON 07/04/2023] amiodarone  200 mg Oral BID   Followed by   Melene Muller ON 07/11/2023] amiodarone  200 mg Oral Daily   apixaban  5 mg Oral BID   budesonide (PULMICORT) nebulizer solution  0.5 mg Nebulization BID   Chlorhexidine Gluconate Cloth  6 each Topical Daily   feeding supplement  237 mL Oral BID BM   insulin aspart  0-6 Units Subcutaneous TID WC   metoprolol succinate  50 mg Oral Daily   predniSONE  40 mg Oral Q breakfast   sertraline  100 mg Oral Daily   Continuous Infusions:  ceFEPime (MAXIPIME) IV 2 g (06/27/23 2126)   PRN Meds: hydrOXYzine, levalbuterol, ondansetron **OR** ondansetron (ZOFRAN) IV   Vital Signs    Vitals:   06/27/23 2345 06/28/23 0400 06/28/23 0500 06/28/23 0600  BP:  (!) 142/78 130/76 (!) 146/118  Pulse: 76 67 67 71  Resp: (!) 23 (!) 28 (!) 24 (!) 29  Temp:    98.2 F (36.8 C)  TempSrc:    Axillary  SpO2: 100% 99% 99% 98%  Weight:      Height:        Intake/Output Summary (Last 24 hours) at 06/28/2023 0757 Last data filed at 06/27/2023 1800 Gross per 24 hour  Intake 527.4 ml  Output 400 ml  Net 127.4 ml      06/25/2023    8:32 AM 06/22/2023    9:38 AM 05/16/2023   10:09 AM  Last 3 Weights  Weight (lbs) 111 lb 1.8 oz 109 lb 110 lb 6.4 oz  Weight (kg) 50.4 kg 49.442 kg 50.077 kg      Telemetry    Nsr, 1 afib episdoe - Personally Reviewed  ECG    No new - Personally Reviewed  Physical Exam   GEN: No acute distress.   Neck: No JVD Cardiac: RRR, no murmurs, rubs, or gallops.  Respiratory: crackles. GI: Soft, nontender,  non-distended  MS: No edema; No deformity. Neuro:  Nonfocal  Psych: Normal affect   Labs    High Sensitivity Troponin:   Recent Labs  Lab 06/22/23 1323 06/24/23 0534 06/24/23 0723 06/26/23 0900 06/26/23 1055  TROPONINIHS 25* 31* 32* 27* 25*     Chemistry Recent Labs  Lab 06/22/23 0944 06/24/23 0534 06/25/23 0435 06/25/23 0436 06/25/23 1409 06/26/23 0243 06/27/23 0414 06/28/23 0422  NA 145 142  --  138  --  137 139 141  K 4.0 4.0  --  2.9*   < > 4.0 3.6 3.8  CL 107 103  --  98  --  96* 100 105  CO2 25 24  --  26  --  26 26 27   GLUCOSE 125* 349*  --  106*  --  176* 225* 156*  BUN 23 29*  --  22  --  33* 40* 29*  CREATININE 0.89 0.87  --  0.67  --  1.05* 1.08* 0.77  CALCIUM 9.2 8.6*  --  8.0*  --  8.2* 7.9* 8.3*  MG  --   --  2.2  --   --   --   --   --   PROT 6.3* 6.4*  --  6.0*  --   --   --   --   ALBUMIN 3.4* 3.4*  --  3.4*  --   --   --   --   AST 72* 97*  --  42*  --   --   --   --   ALT 57* 86*  --  61*  --   --   --   --   ALKPHOS 63 68  --  63  --   --   --   --   BILITOT 0.9 0.8  --  1.0  --   --   --   --   GFRNONAA >60 >60  --  >60  --  55* 53* >60  ANIONGAP 13 15  --  14  --  15 13 9    < > = values in this interval not displayed.    Lipids No results for input(s): "CHOL", "TRIG", "HDL", "LABVLDL", "LDLCALC", "CHOLHDL" in the last 168 hours.  Hematology Recent Labs  Lab 06/25/23 0436 06/26/23 0900 06/27/23 0414  WBC 21.0* 25.4* 19.8*  RBC 3.33* 3.67* 3.56*  HGB 11.4* 12.6 12.0  HCT 34.2* 40.2 36.9  MCV 102.7* 109.5* 103.7*  MCH 34.2* 34.3* 33.7  MCHC 33.3 31.3 32.5  RDW 13.9 14.4 14.1  PLT 385 478* 378   Thyroid  Recent Labs  Lab 06/24/23 0534  TSH 7.817*    BNP Recent Labs  Lab 06/22/23 0944 06/24/23 0534 06/26/23 0900  BNP 1,169.8* 1,629.4* 678.7*    DDimer  Recent Labs  Lab 06/24/23 0743  DDIMER 1.63*     Radiology    ECHOCARDIOGRAM COMPLETE Result Date: 06/27/2023    ECHOCARDIOGRAM REPORT   Patient Name:   Taylor Chambers Date of Exam: 06/26/2023 Medical Rec #:  [id number]   Height:       62.0 in Accession #:    [id number]  Weight:       111.1 lb Date of Birth:  [birth year]   BSA:          1.489 m Patient Age:    77 years    BP:           146/88 mmHg Patient Gender: F           HR:           76 bpm. Exam Location:  [facility] Procedure: 2D Echo, Cardiac Doppler and Color Doppler Indications:     Elevated Troponin  History:         Patient has prior history of Echocardiogram examinations, most                  recent 01/26/2023. COPD, Signs/Symptoms:Dyspnea; Risk                  Factors:Hypertension, Dyslipidemia and Former Smoker.                  Emphysema.  Sonographer:     Daphine Deutscher RDCS Referring Phys:  [id number] Francee Nodal Brittney Caraway Diagnosing Phys: Cristal Deer End MD IMPRESSIONS  1. Left ventricular ejection fraction, by estimation, is 55 to 60%. The left ventricle has normal function. The left ventricle has no regional wall motion abnormalities. Left ventricular diastolic parameters are consistent with Grade II diastolic dysfunction (pseudonormalization).  2. Right ventricular systolic function is normal. The right ventricular size  is normal. There is moderately elevated pulmonary artery systolic pressure.  3. The mitral valve is degenerative. Moderate to severe mitral valve regurgitation. No evidence of mitral stenosis.  4. Tricuspid valve regurgitation is mild to moderate.  5. The aortic valve is tricuspid. There is mild thickening of the aortic valve. Aortic valve regurgitation is trivial. Aortic valve sclerosis/calcification is present, without any evidence of aortic stenosis.  6. The inferior vena cava is normal in size with <50% respiratory variability, suggesting right atrial pressure of 8 mmHg. FINDINGS  Left Ventricle: Left ventricular ejection fraction, by estimation, is 55 to 60%. The left ventricle has normal function. The left ventricle has no regional wall motion abnormalities. The left ventricular internal  cavity size was normal in size. There is  no left ventricular hypertrophy. Left ventricular diastolic parameters are consistent with Grade II diastolic dysfunction (pseudonormalization). Right Ventricle: The right ventricular size is normal. No increase in right ventricular wall thickness. Right ventricular systolic function is normal. There is moderately elevated pulmonary artery systolic pressure. The tricuspid regurgitant velocity is 3.36 m/s, and with an assumed right atrial pressure of 8 mmHg, the estimated right ventricular systolic pressure is 53.2 mmHg. Left Atrium: Left atrial size was normal in size. Right Atrium: Right atrial size was normal in size. Pericardium: There is no evidence of pericardial effusion. Mitral Valve: The mitral valve is degenerative in appearance. There is moderate thickening of the mitral valve leaflet(s). Moderate to severe mitral valve regurgitation. No evidence of mitral valve stenosis. Tricuspid Valve: The tricuspid valve is normal in structure. Tricuspid valve regurgitation is mild to moderate. Aortic Valve: The aortic valve is tricuspid. There is mild thickening of the aortic valve. There is mild to moderate aortic valve annular calcification. Aortic valve regurgitation is trivial. Aortic valve sclerosis/calcification is present, without any evidence of aortic stenosis. Aortic valve mean gradient measures 5.6 mmHg. Aortic valve peak gradient measures 10.3 mmHg. Aortic valve area, by VTI measures 0.78 cm. Pulmonic Valve: The pulmonic valve was grossly normal. Pulmonic valve regurgitation is trivial. No evidence of pulmonic stenosis. Aorta: The aortic root is normal in size and structure. Pulmonary Artery: The pulmonary artery is not well seen. Venous: The inferior vena cava is normal in size with less than 50% respiratory variability, suggesting right atrial pressure of 8 mmHg. IAS/Shunts: No atrial level shunt detected by color flow Doppler.  LEFT VENTRICLE PLAX 2D LVIDd:          4.60 cm   Diastology LVIDs:         3.00 cm   LV e' medial:    7.58 cm/s LV PW:         0.50 cm   LV E/e' medial:  14.1 LV IVS:        0.50 cm   LV e' lateral:   7.51 cm/s LVOT diam:     1.50 cm   LV E/e' lateral: 14.3 LV SV:         24 LV SV Index:   16 LVOT Area:     1.77 cm  RIGHT VENTRICLE             IVC RV Basal diam:  3.20 cm     IVC diam: 1.70 cm RV S prime:     12.67 cm/s TAPSE (M-mode): 3.0 cm LEFT ATRIUM           Index        RIGHT ATRIUM          Index  LA diam:      4.10 cm 2.75 cm/m   RA Area:     9.39 cm LA Vol (A2C): 44.6 ml 29.95 ml/m  RA Volume:   19.10 ml 12.83 ml/m LA Vol (A4C): 28.5 ml 19.14 ml/m  AORTIC VALVE AV Area (Vmax):    0.74 cm AV Area (Vmean):   0.71 cm AV Area (VTI):     0.78 cm AV Vmax:           160.47 cm/s AV Vmean:          113.058 cm/s AV VTI:            0.303 m AV Peak Grad:      10.3 mmHg AV Mean Grad:      5.6 mmHg LVOT Vmax:         67.10 cm/s LVOT Vmean:        45.267 cm/s LVOT VTI:          0.134 m LVOT/AV VTI ratio: 0.44  AORTA Ao Root diam: 2.40 cm MITRAL VALVE                TRICUSPID VALVE MV Area (PHT): 4.70 cm     TR Peak grad:   45.2 mmHg MV Decel Time: 162 msec     TR Vmax:        336.00 cm/s MR Peak grad: 85.4 mmHg MR Mean grad: 65.5 mmHg     SHUNTS MR Vmax:      462.00 cm/s   Systemic VTI:  0.13 m MR Vmean:     395.5 cm/s    Systemic Diam: 1.50 cm MV E velocity: 107.00 cm/s MV A velocity: 70.60 cm/s MV E/A ratio:  1.52 Cristal Deer End MD Electronically signed by Yvonne Kendall MD Signature Date/Time: 06/27/2023/7:23:05 AM    Final    DG Chest Port 1 View Result Date: 06/26/2023 CLINICAL DATA:  Dyspnea.  History of COPD. EXAM: PORTABLE CHEST 1 VIEW COMPARISON:  Chest radiograph and CTA chest dated June 24, 2023. FINDINGS: Stable cardiomediastinal silhouette. Aortic atherosclerosis. Hyperinflation with emphysematous changes, compatible with COPD. Persistent small bilateral pleural effusions with basilar atelectasis. Similar left-sided basilar  predominant interstitial changes. No pneumothorax. The visualized osseous structures are unchanged. IMPRESSION: 1. Persistent small bilateral pleural effusions with basilar atelectasis. 2. Similar left basilar predominant interstitial opacities, could reflect a combination of infiltrate, asymmetric edema, and/or atelectasis. Electronically Signed   By: Hart Robinsons M.D.   On: 06/26/2023 09:23    Cardiac Studies   Echo 06/26/23   1. Left ventricular ejection fraction, by estimation, is 55 to 60%. The  left ventricle has normal function. The left ventricle has no regional  wall motion abnormalities. Left ventricular diastolic parameters are  consistent with Grade II diastolic  dysfunction (pseudonormalization).   2. Right ventricular systolic function is normal. The right ventricular  size is normal. There is moderately elevated pulmonary artery systolic  pressure.   3. The mitral valve is degenerative. Moderate to severe mitral valve  regurgitation. No evidence of mitral stenosis.   4. Tricuspid valve regurgitation is mild to moderate.   5. The aortic valve is tricuspid. There is mild thickening of the aortic  valve. Aortic valve regurgitation is trivial. Aortic valve  sclerosis/calcification is present, without any evidence of aortic  stenosis.   6. The inferior vena cava is normal in size with <50% respiratory  variability, suggesting right atrial pressure of 8 mmHg.    TEE 04/20/2023: 1. Left ventricular ejection  fraction, by estimation, is 60 to 65%. The  left ventricle has normal function. The left ventricle has no regional  wall motion abnormalities.   2. Right ventricular systolic function is normal. The right ventricular  size is normal.   3. No left atrial/left atrial appendage thrombus was detected.   4. The mitral valve is normal in structure. Moderate mitral valve  regurgitation. No evidence of mitral stenosis. No significant valve  prolapse noted.   5. The aortic  valve is normal in structure. Aortic valve regurgitation is  not visualized. No aortic stenosis is present.   6. There is mild (Grade II) atheroma plaque involving the aortic arch and  descending aorta.   7. The inferior vena cava is normal in size with greater than 50%  respiratory variability, suggesting right atrial pressure of 3 mmHg.   8. Agitated saline contrast bubble study was negative, with no evidence  of any interatrial shunt.   Conclusion(s)/Recommendation(s): Normal biventricular function without  evidence of hemodynamically significant valvular heart disease.   FINDINGS   Left Ventricle: Left ventricular ejection fraction, by estimation, is 60  to 65%. The left ventricle has normal function. The left ventricle has no  regional wall motion abnormalities. The left ventricular internal cavity  size was normal in size. There is   no left ventricular hypertrophy.  __________   2D echo 01/26/2023: 1. Left ventricular ejection fraction, by estimation, is 55 to 60%. The  left ventricle has normal function. The left ventricle has no regional  wall motion abnormalities. Left ventricular diastolic parameters are  indeterminate. The average left  ventricular global longitudinal strain is -19.0 %.   2. Right ventricular systolic function is normal. The right ventricular  size is normal. There is normal pulmonary artery systolic pressure. The  estimated right ventricular systolic pressure is 28.8 mmHg.   3. The mitral valve is normal in structure. Moderate to severe mitral  valve regurgitation. No evidence of mitral stenosis. There is mild late  systolic prolapse of multiple segments of the anterior leaflet of the  mitral valve.   4. Tricuspid valve regurgitation is mild to moderate.   5. The aortic valve is normal in structure. Aortic valve regurgitation is  not visualized. No aortic stenosis is present.   6. The inferior vena cava is normal in size with greater than 50%   respiratory variability, suggesting right atrial pressure of 3 mmHg.  __________   Luci Bank patch 12/2022: Patient had a min HR of 56 bpm, max HR of 214 bpm, and avg HR of 84 bpm. Predominant underlying rhythm was Sinus Rhythm. 106 Supraventricular Tachycardia runs occurred, the run with the fastest interval lasting 6 beats with a max rate of 214 bpm, the longest lasting 2 mins 10 secs with an avg rate of 146 bpm. Atrial Fibrillation/Flutter occurred (1% burden), ranging from 119-213 bpm (avg of 153 bpm), the longest lasting 2 hours 4 mins with an avg rate of 152 bpm.  Occasional PACs and rare PVCs. __________   Coronary CTA 04/07/2022: FINDINGS: Aorta: Normal size. Mild aortic root and descending aorta calcifications. No dissection.   Aortic Valve:  Trileaflet.  No calcifications.   Coronary Arteries:  Normal coronary origin.  Right dominance.   RCA is a dominant artery that gives rise to PDA and PLA. There is calcified plaque proximally causing minimal stenosis (<25%).   Left main gives rise to LAD and LCX arteries.  LM has no stenosis.   LAD has no plaque.  LCX is a non-dominant artery that gives rise to two obtuse marginal branches. There is calcified plaque proximally causing mild stenosis (25-49%).   Other findings:   Normal pulmonary vein drainage into the left atrium.   Normal left atrial appendage without a thrombus.   Normal size of the pulmonary artery.   IMPRESSION: 1. Coronary calcium score of 95.3. This was 59th percentile for age and sex matched control. 2. Normal coronary origin with right dominance. 3. Mild proximal LCx stenosis (25%). 4. Minimal proximal RCA stenosis (<25%). 5. CAD-RADS 2. Mild non-obstructive CAD (25-49%). Consider non-atherosclerotic causes of chest pain. Consider preventive therapy and risk factor modification. 6. Image quality degraded by motion artifacts.   Noncardiac overread: IMPRESSION: 1. No acute extracardiac findings. 2.  Tree-in-bud nodules which were present on most recent prior chest CT have resolved, likely sequela of infection or aspiration. 3. Solid pulmonary nodules of the right lower lobe are unchanged in size when compared with prior lung cancer screening CT chest CT dated July 16, 2021. Recommend attention on annual lung cancer screening CT. 4. Aortic Atherosclerosis and Emphysema. __________   2D echo 01/18/2022 Gavin Potters): MILD LV SYSTOLIC DYSFUNCTION (See above)  NORMAL RIGHT VENTRICULAR SYSTOLIC FUNCTION  NO VALVULAR STENOSIS  MODERATE MR  MILD TR, PR  EF 45%  __________   Eugenie Birks MPI 02/24/2020 Gavin Potters): Normal myocardial perfusion scan no evidence of stress-induced  medical ischemia ejection fraction of 64% conclusion negative scan  __________   2D echo 02/24/2020 Gavin Potters): NORMAL LEFT VENTRICULAR SYSTOLIC FUNCTION WITH AN ESTIMATED EF = >55 %  NORMAL RIGHT VENTRICULAR SYSTOLIC FUNCTION  MODERATE MITRAL VALVE INSUFFICIENCY  MILD-TO-MODERATE TRICUSPID VALVE INSUFFICIENCY  NO VALVULAR STENOSIS     Patient Profile     78 y.o. female with h/o nonobstructive CAD by coronary CTA in 04/2022, HFimpEF, PAF diagnosed in 09/2022, moderate MR, HTN, HLD, bipolar disorder, and COPD with prior tobacco use quitting in 2023 who is being seen for atrial flutter with RVR.   Assessment & Plan    Acute hypoxic and hypercarbic respiratory failure - multifactorial including COPD exacerbation with possible PNA, now with aflutter RVR - pulmonary issues per primary service   Newly diagnosed Atrial flutter with RVR with h/o PAF - patient noted to be in NSR during ER visit 1/16 and continue SR upon admission on 1/18 with develop ment of aflutter 2-1 AV block with rates in the 140s on the overnight hours going into 1/19. - suspect aflutter secondary to pulmonary issues, hypokalemia, steroids and nebulizers - started on lopressor 12.5mg  Q6H and IV amiodarone with conversion to SB 1/19 around  5:30pm - continue Eliquis 5mg  BID - CHADSVASC at least 6 - remains in NSR with HR in the 70s with one brief episode of afib yesterday - continue oral amiodarone and increase Toprol to 50mg  BID   Nonobstructive CAD with elevated HS trop - no chest pain reported - HS trop mildly elevated with flat trend - no ASA given DOAC - Echo showed LVEF 55-60%, G2DD, moderately elevated pulmonary artery systolic pressure, moderate to severe MR, mild to mod TR, mild thickening of aortic valve with trivial AI - statin held for mildly elevated LFTs - any further ischemic work-up can be OP   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

## 2023-06-28 NOTE — Discharge Summary (Signed)
Taylor Chambers WUJ:[id number] DOB: [birth year] DOA: 06/24/2023  PCP: Mick Sell, MD  Admit date: 06/24/2023 Discharge date: 06/28/2023  Time spent: 35 minutes  Recommendations for Outpatient Follow-up:  Pcp, cardiology, and pulmonology f/u     Discharge Diagnoses:  Principal Problem:   Acute hypercapnic respiratory failure (HCC) Active Problems:   Sepsis (HCC)   Tobacco use disorder   Benign essential hypertension   Malnutrition of moderate degree   Paroxysmal atrial fibrillation with RVR (HCC)   Mitral valve insufficiency   Hyperglycemia   COPD (chronic obstructive pulmonary disease) (HCC)   Discharge Condition: stable  Diet recommendation: heart healthy  Filed Weights   06/25/23 0832  Weight: 50.4 kg    History of present illness:  From admission h and p Taylor Chambers is a 78 y.o. female with medical history significant of COPD, CAD, atrial fibrillation, HFpEF, mitral insufficiency, hyperlipidemia, hypertension, tobacco abuse presenting with acute respiratory failure with hypoxia, acute on chronic HFpEF, COPD exacerbation, pneumonia, sepsis.  Patient reports increased work of breathing over the past 2 to 3 days.  Positive cough wheezing and sputum production.  Minimal malaise.  Mild orthopnea.  No reported fevers or chills.  Previously smoking 1 pack/day.  Patient states she no longer smokes.  No abdominal pain or diarrhea.  Symptoms have progressively worsened over this timeframe.  Baseline atrial fibrillation and CAD followed by [facility] outpatient.  Has been compliant with medication regimen per report.   Hospital Course:  Patient presents with acute hypercarbic respiratory failure on chronic hypoxic respiratory failure (2-3 liters), required bipap on admission and again on hospital day 2. This was secondary to copd exacerbation and a-fib with rvr. Baseline abg does not show hypercarbia, so does not qualify for home bipap, was treated for copd exacerbation with possible cap  (CTA shows dependent lung opacities) with ceftriaxone/azithromycin initially, broadened to cefepime after 2nd round of bipap, and was given a dose of levaquin prior to discharge to complete a 5 day course of abx. Will write for steroid taper at discharge. Respiratory panel negative. For her a-fib with rvr  this was treated with oral and then IV beta blockers initially, when this did not resolve cardiology was consulted and amiodarone was started, cardiology now advises discharge with increased dose of home metoprolol and to continue oral amio load and then maintenance dosing, with close follow up. Patient now stable on home O2 for 48 hours, PT/OT advise home health which we are ordering, will need close f/u with pcp, cardiology, and pulmonology.   Procedures: none   Consultations: cardiology  Discharge Exam: Vitals:   06/28/23 1200 06/28/23 1300  BP: 127/76 119/60  Pulse: (!) 48 72  Resp: (!) 29 (!) 26  Temp: 98.1 F (36.7 C)   SpO2: 98% 94%    General: NAD Cardiovascular: irreg irreg Respiratory: rales at bases otherwise clear  Discharge Instructions   Discharge Instructions     Diet - low sodium heart healthy   Complete by: As directed    Increase activity slowly   Complete by: As directed       Allergies as of 06/28/2023       Reactions   Bupropion Other (See Comments)   Foggy brain        Medication List     STOP taking these medications    doxycycline 100 MG tablet Commonly known as: VIBRA-TABS   losartan 100 MG tablet Commonly known as: COZAAR       TAKE these medications  albuterol 108 (90 Base) MCG/ACT inhaler Commonly known as: VENTOLIN HFA Inhale 2 puffs into the lungs every 6 (six) hours as needed for wheezing or shortness of breath.   amiodarone 200 MG tablet Commonly known as: Pacerone Take 2 tabs twice a day to start. On 1/28 start taking 1 tab twice a day. On 2/4 start taking one tab once a day   apixaban 5 MG Tabs tablet Commonly  known as: ELIQUIS Take 1 tablet (5 mg total) by mouth 2 (two) times daily.   CALCIUM 500 PO Take 500 mg by mouth daily at 12 noon.   Combivent Respimat 20-100 MCG/ACT Aers respimat Generic drug: Ipratropium-Albuterol Inhale 1 puff into the lungs 2 (two) times daily.   ipratropium-albuterol 0.5-2.5 (3) MG/3ML Soln Commonly known as: DUONEB Take 3 mLs by nebulization 2 (two) times daily. Mix with budesonide   Dupilumab 300 MG/2ML Soaj Inject 300 mg into the skin every 14 (fourteen) days. Saturday   EPINEPHrine 0.3 mg/0.3 mL Soaj injection Commonly known as: EPI-PEN Inject 0.3 mg into the muscle as needed for anaphylaxis.   ibuprofen 200 MG tablet Commonly known as: ADVIL Take 400 mg by mouth every 6 (six) hours as needed for headache.   metoprolol succinate 50 MG 24 hr tablet Commonly known as: TOPROL-XL Take 1 tablet (50 mg total) by mouth 2 (two) times daily. Take with or immediately following a meal. What changed: when to take this   Ohtuvayre 3 MG/2.5ML Susp Generic drug: Ensifentrine Take 3 mg by nebulization daily.   omeprazole 40 MG capsule Commonly known as: PRILOSEC Take 40 mg by mouth daily as needed (Heartburn).   pravastatin 10 MG tablet Commonly known as: PRAVACHOL Take 10 mg by mouth daily.   predniSONE 10 MG tablet Commonly known as: DELTASONE 4 tabs daily for 3 days then 3 tabs daily for 3 days then 2 tabs daily for 3 days then 1 tab daily for 3 days What changed:  medication strength how much to take how to take this when to take this additional instructions   PRESERVISION AREDS 2+MULTI VIT PO Take 2 tablets by mouth daily.   sertraline 100 MG tablet Commonly known as: ZOLOFT Take 100 mg by mouth daily.   sulfamethoxazole-trimethoprim 400-80 MG tablet Commonly known as: BACTRIM Take 1 tablet by mouth 3 (three) times a week. Monday, Wednesday and Friday   Vitamin D3 1.25 MG (50000 UT) Caps Take 5,000 Units by mouth daily in the  afternoon.       Allergies  Allergen Reactions   Bupropion Other (See Comments)    Foggy brain    Follow-up Information     Iran Ouch, MD Follow up.   Specialty: Cardiology Contact information: [address] [place] [address] [phone]         Mick Sell, MD Follow up.   Specialty: Infectious Diseases Contact information: [address] [place] [address] [phone]         Vida Rigger, MD Follow up.   Specialty: Pulmonary Disease Contact information: [address] [place] [address] [phone]                  The results of significant diagnostics from this hospitalization (including imaging, microbiology, ancillary and laboratory) are listed below for reference.    Significant Diagnostic Studies: ECHOCARDIOGRAM COMPLETE Result Date: 06/27/2023    ECHOCARDIOGRAM REPORT   Patient Name:   KATORIA NASSO Date of Exam: 06/26/2023 Medical Rec #:  [id number]   Height:       62.0 in Accession #:    [id number]  Weight:       111.1 lb Date of Birth:  1945-11-23   BSA:          1.489 m Patient Age:    77 years    BP:           146/88 mmHg Patient Gender: F           HR:           76 bpm. Exam Location:  [facility] Procedure: 2D Echo, Cardiac Doppler and Color Doppler Indications:     Elevated Troponin  History:         Patient has prior history of Echocardiogram examinations, most                  recent 01/26/2023. COPD, Signs/Symptoms:Dyspnea; Risk                  Factors:Hypertension, Dyslipidemia and Former Smoker.                  Emphysema.  Sonographer:     Daphine Deutscher RDCS Referring Phys:  [id number] Francee Nodal FURTH Diagnosing Phys: Cristal Deer End MD IMPRESSIONS  1. Left ventricular ejection fraction, by estimation, is 55 to 60%. The left ventricle has normal function. The left ventricle has no regional wall motion abnormalities. Left ventricular diastolic parameters are consistent with Grade II  diastolic dysfunction (pseudonormalization).  2. Right ventricular systolic function is normal. The right ventricular size is normal. There is moderately elevated pulmonary artery systolic pressure.  3. The mitral valve is degenerative. Moderate to severe mitral valve regurgitation. No evidence of mitral stenosis.  4. Tricuspid valve regurgitation is mild to moderate.  5. The aortic valve is tricuspid. There is mild thickening of the aortic valve. Aortic valve regurgitation is trivial. Aortic valve sclerosis/calcification is present, without any evidence of aortic stenosis.  6. The inferior vena cava is normal in size with <50% respiratory variability, suggesting right atrial pressure of 8 mmHg. FINDINGS  Left Ventricle: Left ventricular ejection fraction, by estimation, is 55 to 60%. The left ventricle has normal function. The left ventricle has no regional wall motion abnormalities. The left ventricular internal cavity size was normal in size. There is  no left ventricular hypertrophy. Left ventricular diastolic parameters are consistent with Grade II diastolic dysfunction (pseudonormalization). Right Ventricle: The right ventricular size is normal. No increase in right ventricular wall thickness. Right ventricular systolic function is normal. There is moderately elevated pulmonary artery systolic pressure. The tricuspid regurgitant velocity is 3.36 m/s, and with an assumed right atrial pressure of 8 mmHg, the estimated right ventricular systolic pressure is 53.2 mmHg. Left Atrium: Left atrial size was normal in size. Right Atrium: Right atrial size was normal in size. Pericardium: There is no evidence of pericardial effusion. Mitral Valve: The mitral valve is degenerative in appearance. There is moderate thickening of the mitral valve leaflet(s). Moderate to severe mitral valve regurgitation. No evidence of mitral valve stenosis. Tricuspid Valve: The tricuspid valve is normal in structure. Tricuspid valve  regurgitation is mild to moderate. Aortic Valve: The aortic valve is tricuspid. There is mild thickening of the aortic valve. There is mild to moderate aortic valve annular calcification. Aortic valve regurgitation is trivial. Aortic valve sclerosis/calcification is present, without any evidence of aortic stenosis. Aortic valve mean gradient measures 5.6 mmHg. Aortic valve peak gradient measures 10.3 mmHg. Aortic valve  area, by VTI measures 0.78 cm. Pulmonic Valve: The pulmonic valve was grossly normal. Pulmonic valve regurgitation is trivial. No evidence of pulmonic stenosis. Aorta: The aortic root is normal in size and structure. Pulmonary Artery: The pulmonary artery is not well seen. Venous: The inferior vena cava is normal in size with less than 50% respiratory variability, suggesting right atrial pressure of 8 mmHg. IAS/Shunts: No atrial level shunt detected by color flow Doppler.  LEFT VENTRICLE PLAX 2D LVIDd:         4.60 cm   Diastology LVIDs:         3.00 cm   LV e' medial:    7.58 cm/s LV PW:         0.50 cm   LV E/e' medial:  14.1 LV IVS:        0.50 cm   LV e' lateral:   7.51 cm/s LVOT diam:     1.50 cm   LV E/e' lateral: 14.3 LV SV:         24 LV SV Index:   16 LVOT Area:     1.77 cm  RIGHT VENTRICLE             IVC RV Basal diam:  3.20 cm     IVC diam: 1.70 cm RV S prime:     12.67 cm/s TAPSE (M-mode): 3.0 cm LEFT ATRIUM           Index        RIGHT ATRIUM          Index LA diam:      4.10 cm 2.75 cm/m   RA Area:     9.39 cm LA Vol (A2C): 44.6 ml 29.95 ml/m  RA Volume:   19.10 ml 12.83 ml/m LA Vol (A4C): 28.5 ml 19.14 ml/m  AORTIC VALVE AV Area (Vmax):    0.74 cm AV Area (Vmean):   0.71 cm AV Area (VTI):     0.78 cm AV Vmax:           160.47 cm/s AV Vmean:          113.058 cm/s AV VTI:            0.303 m AV Peak Grad:      10.3 mmHg AV Mean Grad:      5.6 mmHg LVOT Vmax:         67.10 cm/s LVOT Vmean:        45.267 cm/s LVOT VTI:          0.134 m LVOT/AV VTI ratio: 0.44  AORTA Ao Root diam:  2.40 cm MITRAL VALVE                TRICUSPID VALVE MV Area (PHT): 4.70 cm     TR Peak grad:   45.2 mmHg MV Decel Time: 162 msec     TR Vmax:        336.00 cm/s MR Peak grad: 85.4 mmHg MR Mean grad: 65.5 mmHg     SHUNTS MR Vmax:      462.00 cm/s   Systemic VTI:  0.13 m MR Vmean:     395.5 cm/s    Systemic Diam: 1.50 cm MV E velocity: 107.00 cm/s MV A velocity: 70.60 cm/s MV E/A ratio:  1.52 Cristal Deer End MD Electronically signed by Yvonne Kendall MD Signature Date/Time: 06/27/2023/7:23:05 AM    Final    DG Chest Port 1 View Result Date: 06/26/2023 CLINICAL DATA:  Dyspnea.  History of  COPD. EXAM: PORTABLE CHEST 1 VIEW COMPARISON:  Chest radiograph and CTA chest dated June 24, 2023. FINDINGS: Stable cardiomediastinal silhouette. Aortic atherosclerosis. Hyperinflation with emphysematous changes, compatible with COPD. Persistent small bilateral pleural effusions with basilar atelectasis. Similar left-sided basilar predominant interstitial changes. No pneumothorax. The visualized osseous structures are unchanged. IMPRESSION: 1. Persistent small bilateral pleural effusions with basilar atelectasis. 2. Similar left basilar predominant interstitial opacities, could reflect a combination of infiltrate, asymmetric edema, and/or atelectasis. Electronically Signed   By: Hart Robinsons M.D.   On: 06/26/2023 09:23   CT Angio Chest Pulmonary Embolism (PE) W or WO Contrast Result Date: 06/24/2023 CLINICAL DATA:  Positive D-dimer.  Respiratory distress. EXAM: CT ANGIOGRAPHY CHEST WITH CONTRAST TECHNIQUE: Multidetector CT imaging of the chest was performed using the standard protocol during bolus administration of intravenous contrast. Multiplanar CT image reconstructions and MIPs were obtained to evaluate the vascular anatomy. RADIATION DOSE REDUCTION: This exam was performed according to the departmental dose-optimization program which includes automated exposure control, adjustment of the mA and/or kV according to  patient size and/or use of iterative reconstruction technique. CONTRAST:  75mL OMNIPAQUE IOHEXOL 350 MG/ML SOLN COMPARISON:  Chest CT, 01/12/2023, and older exams. FINDINGS: Cardiovascular: Pulmonary arteries are well opacified. There is no evidence of a pulmonary embolism. Heart normal in size configuration. Circumflex coronary artery calcifications. No pericardial effusion. Great vessels are normal in caliber. No aortic dissection. Aortic atherosclerosis. Arch branch vessels are widely patent. Mediastinum/Nodes: No neck base, mediastinal or hilar masses. No enlarged lymph nodes. Trachea and esophagus are unremarkable. Lungs/Pleura: Moderate emphysema. Mild chronic apical pleuroparenchymal scarring. Dependent opacity in the left upper lobe, minimally in the right upper lobe. Ground-glass type opacity in the medial right lower lobe. Dependent opacity noted in the lower lobes, greater on the left with associated bronchial wall and interstitial thickening. Moderate right and small left pleural effusions. No pneumothorax. Upper Abdomen: No acute findings. Musculoskeletal: No fracture or acute finding. No bone lesion. No chest wall mass. Review of the MIP images confirms the above findings. IMPRESSION: 1. No evidence of a pulmonary embolism. 2. Dependent lung opacities as detailed associated with moderate right and small left pleural effusions. Suspect a combination of multifocal infection and dependent atelectasis. Consider a component pulmonary edema in the proper clinical setting. 3. Moderate emphysema. Aortic Atherosclerosis (ICD10-I70.0) and Emphysema (ICD10-J43.9). Electronically Signed   By: Amie Portland M.D.   On: 06/24/2023 10:17   DG Chest Port 1 View Result Date: 06/24/2023 CLINICAL DATA:  Shortness of breath EXAM: PORTABLE CHEST 1 VIEW COMPARISON:  Two days prior FINDINGS: Interval diffuse reticular nodular opacity, although asymmetric to the left, with small pleural effusions. Normal heart size and  mediastinal contours. Extensive artifact from EKG leads. IMPRESSION: New reticulonodular opacity asymmetric to the left lung with small pleural effusions, asymmetry favoring pneumonia over edema. Electronically Signed   By: Tiburcio Pea M.D.   On: 06/24/2023 06:09   DG Chest 2 View Result Date: 06/22/2023 CLINICAL DATA:  Shortness of breath.  Weakness. EXAM: CHEST - 2 VIEW COMPARISON:  Chest radiograph dated September 05, 2022. CT chest dated January 12, 2023. FINDINGS: Cardiomediastinal silhouette is unchanged. Aortic atherosclerosis. Hyperinflation with similar chronic prominence of the interstitial lung markings, compatible with COPD. No focal consolidation. Blunting of the right costophrenic angle with mild basilar atelectasis. No pneumothorax. No acute osseous abnormality. IMPRESSION: 1. Blunting of the right costophrenic angle with mild basilar atelectasis could reflect a small pleural effusion or pleural scarring. 2. COPD. Electronically Signed  By: Hart Robinsons M.D.   On: 06/22/2023 10:47    Microbiology: Recent Results (from the past 240 hours)  Blood culture (routine x 2)     Status: None (Preliminary result)   Collection Time: 06/24/23  5:34 AM   Specimen: BLOOD  Result Value Ref Range Status   Specimen Description BLOOD BLOOD RIGHT ARM  Final   Special Requests   Final    BOTTLES DRAWN AEROBIC AND ANAEROBIC Blood Culture results may not be optimal due to an inadequate volume of blood received in culture bottles   Culture   Final    NO GROWTH 4 DAYS Performed at [facility], [address] [place], [place] [address]    Report Status PENDING  Incomplete  Blood culture (routine x 2)     Status: None (Preliminary result)   Collection Time: 06/24/23  5:43 AM   Specimen: BLOOD  Result Value Ref Range Status   Specimen Description BLOOD BLOOD RIGHT FOREARM  Final   Special Requests   Final    BOTTLES DRAWN AEROBIC ONLY Blood Culture results may not be optimal due to an  inadequate volume of blood received in culture bottles   Culture   Final    NO GROWTH 4 DAYS Performed at [facility], [address], [place], [place] [address]    Report Status PENDING  Incomplete  Resp panel by RT-PCR (RSV, Flu A&B, Covid) Anterior Nasal Swab     Status: None   Collection Time: 06/24/23  5:43 AM   Specimen: Anterior Nasal Swab  Result Value Ref Range Status   SARS Coronavirus 2 by RT PCR NEGATIVE NEGATIVE Final    Comment: (NOTE) SARS-CoV-2 target nucleic acids are NOT DETECTED.  The SARS-CoV-2 RNA is generally detectable in upper respiratory specimens during the acute phase of infection. The lowest concentration of SARS-CoV-2 viral copies this assay can detect is 138 copies/mL. A negative result does not preclude SARS-Cov-2 infection and should not be used as the sole basis for treatment or other patient management decisions. A negative result may occur with  improper specimen collection/handling, submission of specimen other than nasopharyngeal swab, presence of viral mutation(s) within the areas targeted by this assay, and inadequate number of viral copies(<138 copies/mL). A negative result must be combined with clinical observations, patient history, and epidemiological information. The expected result is Negative.  Fact Sheet for Patients:  BloggerCourse.com  Fact Sheet for Healthcare Providers:  SeriousBroker.it  This test is no t yet approved or cleared by the [place] FDA and  has been authorized for detection and/or diagnosis of SARS-CoV-2 by FDA under an Emergency Use Authorization (EUA). This EUA will remain  in effect (meaning this test can be used) for the duration of the COVID-19 declaration under Section 564(b)(1) of the Act, 21 U.S.C.section 360bbb-3(b)(1), unless the authorization is terminated  or revoked sooner.       Influenza A by PCR NEGATIVE NEGATIVE Final    Influenza B by PCR NEGATIVE NEGATIVE Final    Comment: (NOTE) The Xpert Xpress SARS-CoV-2/FLU/RSV plus assay is intended as an aid in the diagnosis of influenza from Nasopharyngeal swab specimens and should not be used as a sole basis for treatment. Nasal washings and aspirates are unacceptable for Xpert Xpress SARS-CoV-2/FLU/RSV testing.  Fact Sheet for Patients: BloggerCourse.com  Fact Sheet for Healthcare Providers: SeriousBroker.it  This test is not yet approved or cleared by the [place] FDA and has been authorized for detection and/or diagnosis of  SARS-CoV-2 by FDA under an Emergency Use Authorization (EUA). This EUA will remain in effect (meaning this test can be used) for the duration of the COVID-19 declaration under Section 564(b)(1) of the Act, 21 U.S.C. section 360bbb-3(b)(1), unless the authorization is terminated or revoked.     Resp Syncytial Virus by PCR NEGATIVE NEGATIVE Final    Comment: (NOTE) Fact Sheet for Patients: BloggerCourse.com  Fact Sheet for Healthcare Providers: SeriousBroker.it  This test is not yet approved or cleared by the [place] FDA and has been authorized for detection and/or diagnosis of SARS-CoV-2 by FDA under an Emergency Use Authorization (EUA). This EUA will remain in effect (meaning this test can be used) for the duration of the COVID-19 declaration under Section 564(b)(1) of the Act, 21 U.S.C. section 360bbb-3(b)(1), unless the authorization is terminated or revoked.  Performed at [facility], [address] [place], [place] [address]   MRSA Next Gen by PCR, Nasal     Status: None   Collection Time: 06/25/23  8:34 AM   Specimen: Nasal Mucosa; Nasal Swab  Result Value Ref Range Status   MRSA by PCR Next Gen NOT DETECTED NOT DETECTED Final    Comment: (NOTE) The GeneXpert MRSA Assay (FDA approved for NASAL  specimens only), is one component of a comprehensive MRSA colonization surveillance program. It is not intended to diagnose MRSA infection nor to guide or monitor treatment for MRSA infections. Test performance is not FDA approved in patients less than 79 years old. Performed at [facility], [address] [place], [place] [address]   Respiratory (~20 pathogens) panel by PCR     Status: None   Collection Time: 06/25/23  9:24 AM   Specimen: Nasopharyngeal Swab; Respiratory  Result Value Ref Range Status   Adenovirus NOT DETECTED NOT DETECTED Final   Coronavirus 229E NOT DETECTED NOT DETECTED Final    Comment: (NOTE) The Coronavirus on the Respiratory Panel, DOES NOT test for the novel  Coronavirus (2019 nCoV)    Coronavirus HKU1 NOT DETECTED NOT DETECTED Final   Coronavirus NL63 NOT DETECTED NOT DETECTED Final   Coronavirus OC43 NOT DETECTED NOT DETECTED Final   Metapneumovirus NOT DETECTED NOT DETECTED Final   Rhinovirus / Enterovirus NOT DETECTED NOT DETECTED Final   Influenza A NOT DETECTED NOT DETECTED Final   Influenza B NOT DETECTED NOT DETECTED Final   Parainfluenza Virus 1 NOT DETECTED NOT DETECTED Final   Parainfluenza Virus 2 NOT DETECTED NOT DETECTED Final   Parainfluenza Virus 3 NOT DETECTED NOT DETECTED Final   Parainfluenza Virus 4 NOT DETECTED NOT DETECTED Final   Respiratory Syncytial Virus NOT DETECTED NOT DETECTED Final   Bordetella pertussis NOT DETECTED NOT DETECTED Final   Bordetella Parapertussis NOT DETECTED NOT DETECTED Final   Chlamydophila pneumoniae NOT DETECTED NOT DETECTED Final   Mycoplasma pneumoniae NOT DETECTED NOT DETECTED Final    Comment: Performed at [facility], [address], [place], [place] [address]     Labs: Basic Metabolic Panel: Recent Labs  Lab 06/24/23 0534 06/25/23 0435 06/25/23 0436 06/25/23 1409 06/26/23 0243 06/27/23 0414 06/28/23 0422  NA 142  --  138  --  137 139 141  K 4.0  --  2.9* 4.0 4.0 3.6  3.8  CL 103  --  98  --  96* 100 105  CO2 24  --  26  --  26 26 27   GLUCOSE 349*  --  106*  --  176* 225* 156*  BUN 29*  --  22  --  33* 40* 29*  CREATININE 0.87  --  0.67  --  1.05* 1.08* 0.77  CALCIUM 8.6*  --  8.0*  --  8.2* 7.9* 8.3*  MG  --  2.2  --   --   --   --   --    Liver Function Tests: Recent Labs  Lab 06/22/23 0944 06/24/23 0534 06/25/23 0436  AST 72* 97* 42*  ALT 57* 86* 61*  ALKPHOS 63 68 63  BILITOT 0.9 0.8 1.0  PROT 6.3* 6.4* 6.0*  ALBUMIN 3.4* 3.4* 3.4*   No results for input(s): "LIPASE", "AMYLASE" in the last 168 hours. No results for input(s): "AMMONIA" in the last 168 hours. CBC: Recent Labs  Lab 06/22/23 0944 06/24/23 0534 06/25/23 0436 06/26/23 0900 06/27/23 0414  WBC 10.5 18.5* 21.0* 25.4* 19.8*  NEUTROABS  --  14.0*  --   --   --   HGB 11.7* 12.5 11.4* 12.6 12.0  HCT 36.0 39.9 34.2* 40.2 36.9  MCV 105.9* 108.7* 102.7* 109.5* 103.7*  PLT 339 444* 385 478* 378   Cardiac Enzymes: No results for input(s): "CKTOTAL", "CKMB", "CKMBINDEX", "TROPONINI" in the last 168 hours. BNP: BNP (last 3 results) Recent Labs    06/22/23 0944 06/24/23 0534 06/26/23 0900  BNP 1,169.8* 1,629.4* 678.7*    ProBNP (last 3 results) No results for input(s): "PROBNP" in the last 8760 hours.  CBG: Recent Labs  Lab 06/27/23 1138 06/27/23 1658 06/27/23 2122 06/28/23 0810 06/28/23 1110  GLUCAP 142* 165* 167* 128* 137*       Signed:  Silvano Bilis MD.  [facility] 06/28/2023, 1:40 PM

## 2023-06-28 NOTE — Plan of Care (Signed)
  Problem: Education: Goal: Ability to describe self-care measures that may prevent or decrease complications (Diabetes Survival Skills Education) will improve Outcome: Progressing   Problem: Coping: Goal: Ability to adjust to condition or change in health will improve Outcome: Progressing   Problem: Fluid Volume: Goal: Ability to maintain a balanced intake and output will improve Outcome: Progressing   Problem: Health Behavior/Discharge Planning: Goal: Ability to identify and utilize available resources and services will improve Outcome: Progressing Goal: Ability to manage health-related needs will improve Outcome: Progressing   Problem: Metabolic: Goal: Ability to maintain appropriate glucose levels will improve Outcome: Progressing   Problem: Nutritional: Goal: Maintenance of adequate nutrition will improve Outcome: Progressing   Problem: Skin Integrity: Goal: Risk for impaired skin integrity will decrease Outcome: Progressing   Problem: Education: Goal: Knowledge of General Education information will improve Description: Including pain rating scale, medication(s)/side effects and non-pharmacologic comfort measures Outcome: Progressing   Problem: Health Behavior/Discharge Planning: Goal: Ability to manage health-related needs will improve Outcome: Progressing   Problem: Clinical Measurements: Goal: Ability to maintain clinical measurements within normal limits will improve Outcome: Progressing Goal: Will remain free from infection Outcome: Progressing Goal: Cardiovascular complication will be avoided Outcome: Progressing   Problem: Activity: Goal: Risk for activity intolerance will decrease Outcome: Progressing

## 2023-06-29 ENCOUNTER — Emergency Department: Payer: Medicare HMO

## 2023-06-29 ENCOUNTER — Other Ambulatory Visit: Payer: Self-pay

## 2023-06-29 ENCOUNTER — Inpatient Hospital Stay
Admission: EM | Admit: 2023-06-29 | Discharge: 2023-07-03 | DRG: 291 | Disposition: A | Discharge status: Home Health | Payer: Medicare HMO | Attending: Osteopathic Medicine | Admitting: Osteopathic Medicine

## 2023-06-29 DIAGNOSIS — K219 Gastro-esophageal reflux disease without esophagitis: Secondary | ICD-10-CM | POA: Diagnosis present

## 2023-06-29 DIAGNOSIS — J90 Pleural effusion, not elsewhere classified: Secondary | ICD-10-CM

## 2023-06-29 DIAGNOSIS — G9340 Encephalopathy, unspecified: Secondary | ICD-10-CM | POA: Clinically undetermined

## 2023-06-29 DIAGNOSIS — Z96641 Presence of right artificial hip joint: Secondary | ICD-10-CM | POA: Diagnosis present

## 2023-06-29 DIAGNOSIS — Z9889 Other specified postprocedural states: Secondary | ICD-10-CM

## 2023-06-29 DIAGNOSIS — Z888 Allergy status to other drugs, medicaments and biological substances status: Secondary | ICD-10-CM

## 2023-06-29 DIAGNOSIS — J439 Emphysema, unspecified: Secondary | ICD-10-CM | POA: Diagnosis present

## 2023-06-29 DIAGNOSIS — J9811 Atelectasis: Secondary | ICD-10-CM | POA: Diagnosis present

## 2023-06-29 DIAGNOSIS — Z9849 Cataract extraction status, unspecified eye: Secondary | ICD-10-CM

## 2023-06-29 DIAGNOSIS — J9622 Acute and chronic respiratory failure with hypercapnia: Secondary | ICD-10-CM | POA: Diagnosis present

## 2023-06-29 DIAGNOSIS — I2510 Atherosclerotic heart disease of native coronary artery without angina pectoris: Secondary | ICD-10-CM | POA: Diagnosis present

## 2023-06-29 DIAGNOSIS — R7401 Elevation of levels of liver transaminase levels: Secondary | ICD-10-CM | POA: Diagnosis present

## 2023-06-29 DIAGNOSIS — Z7901 Long term (current) use of anticoagulants: Secondary | ICD-10-CM | POA: Diagnosis not present

## 2023-06-29 DIAGNOSIS — J9601 Acute respiratory failure with hypoxia: Principal | ICD-10-CM

## 2023-06-29 DIAGNOSIS — I5033 Acute on chronic diastolic (congestive) heart failure: Secondary | ICD-10-CM | POA: Diagnosis present

## 2023-06-29 DIAGNOSIS — Z515 Encounter for palliative care: Secondary | ICD-10-CM

## 2023-06-29 DIAGNOSIS — I340 Nonrheumatic mitral (valve) insufficiency: Secondary | ICD-10-CM | POA: Diagnosis present

## 2023-06-29 DIAGNOSIS — J9602 Acute respiratory failure with hypercapnia: Principal | ICD-10-CM

## 2023-06-29 DIAGNOSIS — J441 Chronic obstructive pulmonary disease with (acute) exacerbation: Secondary | ICD-10-CM | POA: Diagnosis present

## 2023-06-29 DIAGNOSIS — F419 Anxiety disorder, unspecified: Secondary | ICD-10-CM | POA: Diagnosis present

## 2023-06-29 DIAGNOSIS — I700 Atherosclerosis of aorta: Secondary | ICD-10-CM | POA: Diagnosis present

## 2023-06-29 DIAGNOSIS — J918 Pleural effusion in other conditions classified elsewhere: Secondary | ICD-10-CM | POA: Diagnosis present

## 2023-06-29 DIAGNOSIS — T502X5A Adverse effect of carbonic-anhydrase inhibitors, benzothiadiazides and other diuretics, initial encounter: Secondary | ICD-10-CM | POA: Diagnosis not present

## 2023-06-29 DIAGNOSIS — I110 Hypertensive heart disease with heart failure: Secondary | ICD-10-CM | POA: Diagnosis present

## 2023-06-29 DIAGNOSIS — J189 Pneumonia, unspecified organism: Secondary | ICD-10-CM | POA: Diagnosis not present

## 2023-06-29 DIAGNOSIS — Z79899 Other long term (current) drug therapy: Secondary | ICD-10-CM

## 2023-06-29 DIAGNOSIS — G4733 Obstructive sleep apnea (adult) (pediatric): Secondary | ICD-10-CM | POA: Diagnosis present

## 2023-06-29 DIAGNOSIS — E873 Alkalosis: Secondary | ICD-10-CM | POA: Diagnosis not present

## 2023-06-29 DIAGNOSIS — J9621 Acute and chronic respiratory failure with hypoxia: Secondary | ICD-10-CM | POA: Diagnosis present

## 2023-06-29 DIAGNOSIS — Z87891 Personal history of nicotine dependence: Secondary | ICD-10-CM | POA: Diagnosis not present

## 2023-06-29 DIAGNOSIS — E785 Hyperlipidemia, unspecified: Secondary | ICD-10-CM | POA: Diagnosis present

## 2023-06-29 DIAGNOSIS — I480 Paroxysmal atrial fibrillation: Secondary | ICD-10-CM | POA: Diagnosis present

## 2023-06-29 DIAGNOSIS — H3530 Unspecified macular degeneration: Secondary | ICD-10-CM | POA: Diagnosis present

## 2023-06-29 DIAGNOSIS — R6510 Systemic inflammatory response syndrome (SIRS) of non-infectious origin without acute organ dysfunction: Secondary | ICD-10-CM | POA: Diagnosis present

## 2023-06-29 LAB — CBC WITH DIFFERENTIAL/PLATELET
Abs Immature Granulocytes: 1.19 10*3/uL — ABNORMAL HIGH (ref 0.00–0.07)
Basophils Absolute: 0.2 10*3/uL — ABNORMAL HIGH (ref 0.0–0.1)
Basophils Relative: 1 %
Eosinophils Absolute: 0 10*3/uL (ref 0.0–0.5)
Eosinophils Relative: 0 %
HCT: 46 % (ref 36.0–46.0)
Hemoglobin: 14 g/dL (ref 12.0–15.0)
Immature Granulocytes: 6 %
Lymphocytes Relative: 3 %
Lymphs Abs: 0.7 10*3/uL (ref 0.7–4.0)
MCH: 34.7 pg — ABNORMAL HIGH (ref 26.0–34.0)
MCHC: 30.4 g/dL (ref 30.0–36.0)
MCV: 114.1 fL — ABNORMAL HIGH (ref 80.0–100.0)
Monocytes Absolute: 2 10*3/uL — ABNORMAL HIGH (ref 0.1–1.0)
Monocytes Relative: 9 %
Neutro Abs: 17.7 10*3/uL — ABNORMAL HIGH (ref 1.7–7.7)
Neutrophils Relative %: 81 %
Platelets: 429 10*3/uL — ABNORMAL HIGH (ref 150–400)
RBC: 4.03 MIL/uL (ref 3.87–5.11)
RDW: 14.8 % (ref 11.5–15.5)
Smear Review: NORMAL
WBC: 21.8 10*3/uL — ABNORMAL HIGH (ref 4.0–10.5)
nRBC: 4.6 % — ABNORMAL HIGH (ref 0.0–0.2)

## 2023-06-29 LAB — CULTURE, BLOOD (ROUTINE X 2)
Culture: NO GROWTH
Culture: NO GROWTH

## 2023-06-29 LAB — URINALYSIS, W/ REFLEX TO CULTURE (INFECTION SUSPECTED)
Bacteria, UA: NONE SEEN
Bilirubin Urine: NEGATIVE
Glucose, UA: NEGATIVE mg/dL
Hgb urine dipstick: NEGATIVE
Ketones, ur: NEGATIVE mg/dL
Leukocytes,Ua: NEGATIVE
Nitrite: NEGATIVE
Protein, ur: >=300 mg/dL — AB
Specific Gravity, Urine: 1.029 (ref 1.005–1.030)
pH: 5 (ref 5.0–8.0)

## 2023-06-29 LAB — STREP PNEUMONIAE URINARY ANTIGEN: Strep Pneumo Urinary Antigen: NEGATIVE

## 2023-06-29 LAB — COMPREHENSIVE METABOLIC PANEL
ALT: 214 U/L — ABNORMAL HIGH (ref 0–44)
AST: 60 U/L — ABNORMAL HIGH (ref 15–41)
Albumin: 3.4 g/dL — ABNORMAL LOW (ref 3.5–5.0)
Alkaline Phosphatase: 74 U/L (ref 38–126)
Anion gap: 14 (ref 5–15)
BUN: 32 mg/dL — ABNORMAL HIGH (ref 8–23)
CO2: 23 mmol/L (ref 22–32)
Calcium: 8.1 mg/dL — ABNORMAL LOW (ref 8.9–10.3)
Chloride: 103 mmol/L (ref 98–111)
Creatinine, Ser: 0.91 mg/dL (ref 0.44–1.00)
GFR, Estimated: >60 mL/min (ref >60)
Glucose, Bld: 188 mg/dL — ABNORMAL HIGH (ref 70–99)
Potassium: 4.3 mmol/L (ref 3.5–5.1)
Sodium: 140 mmol/L (ref 135–145)
Total Bilirubin: 1.2 mg/dL (ref 0.0–1.2)
Total Protein: 6.7 g/dL (ref 6.5–8.1)

## 2023-06-29 LAB — TROPONIN I (HIGH SENSITIVITY)
Troponin I (High Sensitivity): 36 ng/L — ABNORMAL HIGH (ref <18)
Troponin I (High Sensitivity): 41 ng/L — ABNORMAL HIGH (ref <18)

## 2023-06-29 LAB — BLOOD GAS, VENOUS
Acid-base deficit: 4.5 mmol/L — ABNORMAL HIGH (ref 0.0–2.0)
Acid-base deficit: 0.1 mmol/L (ref 0.0–2.0)
Bicarbonate: 26.6 mmol/L (ref 20.0–28.0)
Bicarbonate: 26.8 mmol/L (ref 20.0–28.0)
Delivery systems: POSITIVE
Delivery systems: POSITIVE
O2 Saturation: 55.1 %
O2 Saturation: 95 %
Patient temperature: 37
Patient temperature: 37
pCO2, Ven: 80 mm[Hg] (ref 44–60)
pCO2, Ven: 52 mm[Hg] (ref 44–60)
pH, Ven: 7.13 — CL (ref 7.25–7.43)
pH, Ven: 7.32 (ref 7.25–7.43)
pO2, Ven: 39 mm[Hg] (ref 32–45)
pO2, Ven: 72 mm[Hg] — ABNORMAL HIGH (ref 32–45)

## 2023-06-29 LAB — BRAIN NATRIURETIC PEPTIDE: B Natriuretic Peptide: 1525.9 pg/mL — ABNORMAL HIGH (ref 0.0–100.0)

## 2023-06-29 LAB — LACTIC ACID, PLASMA
Lactic Acid, Venous: 3.2 mmol/L (ref 0.5–1.9)
Lactic Acid, Venous: 1.7 mmol/L (ref 0.5–1.9)

## 2023-06-29 LAB — PROCALCITONIN: Procalcitonin: 0.26 ng/mL

## 2023-06-29 MED ORDER — VANCOMYCIN HCL 750 MG/150ML IV SOLN
750.0000 mg | INTRAVENOUS | Status: DC
  Filled 2023-06-29: qty 150

## 2023-06-29 MED ORDER — APIXABAN 5 MG PO TABS
5.0000 mg | ORAL_TABLET | Freq: Two times a day (BID) | ORAL | Status: DC
  Administered 2023-06-30 – 2023-07-03 (×7): 5 mg via ORAL
  Filled 2023-06-29 (×7): qty 1

## 2023-06-29 MED ORDER — OCUVITE-LUTEIN PO CAPS
1.0000 | ORAL_CAPSULE | Freq: Every day | ORAL | Status: DC
  Filled 2023-06-29: qty 1

## 2023-06-29 MED ORDER — SODIUM CHLORIDE 0.9 % IV SOLN
500.0000 mg | Freq: Once | INTRAVENOUS | Status: AC
  Administered 2023-06-29: 500 mg via INTRAVENOUS
  Filled 2023-06-29: qty 5

## 2023-06-29 MED ORDER — SODIUM CHLORIDE 0.9 % IV BOLUS
1000.0000 mL | Freq: Once | INTRAVENOUS | Status: AC
  Administered 2023-06-29: 1000 mL via INTRAVENOUS

## 2023-06-29 MED ORDER — SODIUM CHLORIDE 0.9 % IV SOLN
2.0000 g | Freq: Once | INTRAVENOUS | Status: AC
  Administered 2023-06-29: 2 g via INTRAVENOUS
  Filled 2023-06-29: qty 12.5

## 2023-06-29 MED ORDER — SODIUM CHLORIDE 0.9 % IV BOLUS: 1500.0000 mL | Freq: Once | INTRAVENOUS | Status: DC

## 2023-06-29 MED ORDER — AMIODARONE HCL 200 MG PO TABS
200.0000 mg | ORAL_TABLET | Freq: Every day | ORAL | Status: AC
Start: 2023-07-11 — End: ?

## 2023-06-29 MED ORDER — AMIODARONE HCL 200 MG PO TABS: 200.0000 mg | ORAL_TABLET | Freq: Two times a day (BID) | ORAL | Status: DC

## 2023-06-29 MED ORDER — ALBUTEROL SULFATE (2.5 MG/3ML) 0.083% IN NEBU
2.5000 mg | INHALATION_SOLUTION | RESPIRATORY_TRACT | Status: DC | PRN
  Administered 2023-06-29 – 2023-07-03 (×7): 2.5 mg via RESPIRATORY_TRACT
  Filled 2023-06-29 (×8): qty 3

## 2023-06-29 MED ORDER — UMECLIDINIUM-VILANTEROL 62.5-25 MCG/ACT IN AEPB
1.0000 | INHALATION_SPRAY | Freq: Every day | RESPIRATORY_TRACT | Status: DC
  Administered 2023-07-01 – 2023-07-03 (×3): 1 via RESPIRATORY_TRACT
  Filled 2023-06-29 (×2): qty 14

## 2023-06-29 MED ORDER — PREDNISONE 20 MG PO TABS
40.0000 mg | ORAL_TABLET | Freq: Every day | ORAL | Status: DC
  Administered 2023-07-01: 40 mg via ORAL
  Filled 2023-06-29: qty 2

## 2023-06-29 MED ORDER — PANTOPRAZOLE SODIUM 40 MG PO TBEC
40.0000 mg | DELAYED_RELEASE_TABLET | Freq: Every day | ORAL | Status: DC
  Administered 2023-07-01 – 2023-07-03 (×3): 40 mg via ORAL
  Filled 2023-06-29 (×4): qty 1

## 2023-06-29 MED ORDER — IPRATROPIUM-ALBUTEROL 0.5-2.5 (3) MG/3ML IN SOLN
RESPIRATORY_TRACT | Status: AC
  Filled 2023-06-29: qty 6

## 2023-06-29 MED ORDER — SODIUM CHLORIDE 0.9% FLUSH: 3.0000 mL | INTRAVENOUS | Status: DC | PRN

## 2023-06-29 MED ORDER — LORAZEPAM 2 MG/ML IJ SOLN
0.5000 mg | Freq: Once | INTRAMUSCULAR | Status: AC
  Administered 2023-06-29: 0.5 mg via INTRAVENOUS
  Filled 2023-06-29: qty 1

## 2023-06-29 MED ORDER — AMIODARONE HCL 200 MG PO TABS
400.0000 mg | ORAL_TABLET | Freq: Two times a day (BID) | ORAL | Status: DC
  Administered 2023-06-30 – 2023-07-03 (×7): 400 mg via ORAL
  Filled 2023-06-29 (×7): qty 2

## 2023-06-29 MED ORDER — METOPROLOL TARTRATE 5 MG/5ML IV SOLN
10.0000 mg | Freq: Three times a day (TID) | INTRAVENOUS | Status: DC
  Administered 2023-06-29 – 2023-07-03 (×12): 10 mg via INTRAVENOUS
  Filled 2023-06-29 (×13): qty 10

## 2023-06-29 MED ORDER — VANCOMYCIN HCL IN DEXTROSE 1-5 GM/200ML-% IV SOLN
1000.0000 mg | Freq: Once | INTRAVENOUS | Status: AC
  Administered 2023-06-29: 1000 mg via INTRAVENOUS
  Filled 2023-06-29: qty 200

## 2023-06-29 MED ORDER — BUDESONIDE 0.5 MG/2ML IN SUSP
2.0000 mg | Freq: Two times a day (BID) | RESPIRATORY_TRACT | Status: DC
  Administered 2023-06-29 (×2): 2 mg via RESPIRATORY_TRACT
  Filled 2023-06-29 (×3): qty 8

## 2023-06-29 MED ORDER — SODIUM CHLORIDE 0.9 % IV SOLN
2.0000 g | Freq: Two times a day (BID) | INTRAVENOUS | Status: DC
  Administered 2023-06-30 – 2023-07-01 (×5): 2 g via INTRAVENOUS
  Filled 2023-06-29 (×6): qty 12.5

## 2023-06-29 MED ORDER — IPRATROPIUM-ALBUTEROL 0.5-2.5 (3) MG/3ML IN SOLN
6.0000 mL | Freq: Once | RESPIRATORY_TRACT | Status: AC
  Administered 2023-06-29: 6 mL via RESPIRATORY_TRACT

## 2023-06-29 MED ORDER — SERTRALINE HCL 50 MG PO TABS
100.0000 mg | ORAL_TABLET | Freq: Every day | ORAL | Status: DC
  Administered 2023-07-01 – 2023-07-03 (×3): 100 mg via ORAL
  Filled 2023-06-29 (×3): qty 2

## 2023-06-29 MED ORDER — ENOXAPARIN SODIUM 40 MG/0.4ML IJ SOSY: 40.0000 mg | PREFILLED_SYRINGE | INTRAMUSCULAR | Status: DC

## 2023-06-29 MED ORDER — METOPROLOL SUCCINATE ER 25 MG PO TB24: 25.0000 mg | ORAL_TABLET | Freq: Two times a day (BID) | ORAL | Status: DC

## 2023-06-29 NOTE — ED Notes (Signed)
Due to WOB off of bi pap, this RN and pt both agreed she was doing better with bi pap on, bi pap placed back on pt. Resp aware.

## 2023-06-29 NOTE — ED Notes (Signed)
VBG results improved, pt placed on [place] 5L/min via [place], pt still has increased WOB.

## 2023-06-29 NOTE — ED Notes (Signed)
Pt made aware of need for UA, pt verbalized understanding. Pure wick in place, pt made aware she can void when needed.

## 2023-06-29 NOTE — ED Notes (Signed)
Per MD its okay to give IVF with elevated BNP  BP stable at this time, pt states she feels improvement.  Repeat lab work to br drawn after IVF infuse, MD aware.

## 2023-06-29 NOTE — Progress Notes (Signed)
CODE SEPSIS - PHARMACY COMMUNICATION  **Broad Spectrum Antibiotics should be administered within 1 hour of Sepsis diagnosis**  Time Code Sepsis Called/Page Received: 4098  Antibiotics Ordered: Cefepime, vancomycin, azithromycin  Time of 1st antibiotic administration: 0939  Additional action taken by pharmacy: N/A  Tressie Ellis 06/29/2023  9:15 AM

## 2023-06-29 NOTE — H&P (Addendum)
HISTORY AND PHYSICAL    Taylor Chambers   EXB:[id number] DOB: 08-11-1945   Date of Service: 06/29/23 Requesting physician/APP from ED: Treatment Team:  Attending Provider: Sunnie Nielsen, DO  PCP: Mick Sell, MD     HPI: Taylor Chambers is a 78 y.o. female with medical history significant of COPD on 2-3L home O2 [place] and OSA w/ CPAP qhs, CAD, atrial fibrillation, HFpEF w/ Grade II diastolic dysfunction, mitral insufficiency, hyperlipidemia, hypertension, tobacco abuse.   Recent admission: She was admitted recently 06/24/23 and discharged yesterday 06/28/23 following treatment for COPD exacerbation, Afib RVR, HFpEF exacerbation all resulting in acute hypercarbic respiratory failure on chronic hypoxic respiratory failure. Per DC summary - she required BiPap on admission and on hospital day 2, baseline ABG did not qualify for home bipap, she completed a 5 day course of abx for pneumonia noted on CT chest, was started on amiodarone and increased beta blocker for Afib RVR, she was stable >48h on home O2 and felt safe for discharge home w/ home health.   Today 06/29/23 presented to ED from home via EMS chief complaint SOB. She states yesterday she felt fine when home from the hospital, slept okay overnight and compliant w/ CPAP and other meds, awoke today and felt okay until relatively sudden worsening SOB when ambulating to the bathroom. Significant increased WOB. Reports low appetite, has been drinking lots of fluids though. No edema, no chest pain. No dizziness, no headache, no vision change. No pain. Reports fatigue.  EMS administered SoluMedrol 125. In ED, RR 30, SpO2 99% on BiPap, WBC 21 w/ high neutrophils, BNP 1525, troponin 36 --> 41, lactate 3.2 --> 1.7, AST 60, ALT 214, renal function WNL, BCx collected, UA Pending on admission, CXR concern for infiltrate/pneumonia. Initial VBG pCO2 80 and pH 7.13 --> repeat pCO2 52 and pH 7.32. EKG sinus rhythm. Concern for sepsis criteria and given  broad abx (cefepime, vancomycin, azithromycin) and IV fluids total 1L. Given DuoNeb. Still significant increased WOB, hospitalist consulted for admission.    Consultants:  Pulmonology  Palliative Care  Procedures: none      ASSESSMENT & PLAN:   Acute on chronic respiratory failure with hypoxia and hypercapnia Supplemental O2 VBG/ABG as needed if worsening   COPD w/ (+)SIRS d/t COPD exacerbation Hx CAP recent admission Question HCAP now, in which case (+)Severe Sepsis d/t HCAP CXR not convincing for pneumonia but given leukocytosis w/ L shift and resp distress, recent hospitalization, there is concern for HCAP Abx: cefepime + vancomycin LABA/LAMA Anoro  ICS budesonide nebs  SABA prn albuterol nebs S/p SoluMedrol, resume prednisone tomorrow  Supplemental O2 BiPap at bedtime  Follow Sputum culture  Follow Resp PCR Follow Legionella Ag Follow Strep pneumo Ag VBG/ABG as needed if worsening  Follow Procalcitonin added on, if normal will consider d/c abx  Follow MRSA screen, if negative will consider d/c Vanc Given readmission, will consult pulmonary Low threshold for further imaging   Hx HFpEF Question HFpEF exacerbation now  Question exacerbation HFpEF, given high BNP and pt was not sent home on diuretics. CXR not convincing for edema but there is questionable infiltrate and faint rales which would support CHF diagnosis  Does not appear fluid overloaded but did receive aggressive IV fluids in ED Will not repeat Echo given recently done Strict I&O Continue telemetry  Consider cardiology consult  consider repeat CXR Trend BNP  CAD Elevated troponin  No chest pain. Suspect troponin d/t demand/supply mismatch given respiratory distress Trend troponin EKG for chest  pain / as needed Telemetry  Continue Eliquis, beta blocker Holding statin d/t transaminitis  Holding ACE/ARB d/t soft BP   Advanced care planning  Discussed code status - pt and family wish for full  code I explained that I do not think she would be likely to recover if she were to need intubation/ventilation but she states she would want to try this and is okay with life support if needed in the even of respiratory arrest or severe distress.  I explained that I do not think she would be likely to survive chest compressions.  Given readmission and high risk decompensation, will ask palliative care to assess   Transaminitis  ALT 214 on admission, 4-5 days ago was 60-80. Values normal 1 year prior. Question shock liver d/t hypoperfusion Monitor CMP If not improving, broaden workup  Paroxysmal Afib Metoprolol at lower dose (50 mg --> 25 mg) given soft BP Continue home amiodarone (400 mg bid for now, then 07/04/23 can start 200 mg bid if she is still in hospital, see cardiology note 01/22)  Continue home Eliquis  Continue telemetry   GERD Continue home PPI  Anxiety/Depression Continue home Zoloft   DVT prophylaxis: Eliquis Pertinent IV fluids/nutrition: holding further IV fluids for now, low salt cardiac diet  Central lines / invasive devices: none  Code Status: FULL CODE Family Communication: husband and son at bedside on admission   Disposition: inpatient, progressive care unit TOC needs: expect will need at least to resume home health, pending clinical course expect may need SNF  Barriers to discharge / significant pending items: respiratory distress requiring BiPap                   Review of Systems:  Review of Systems  Constitutional:  Positive for malaise/fatigue. Negative for chills and fever.  Respiratory:  Positive for shortness of breath. Negative for cough, hemoptysis and sputum production.   Cardiovascular:  Negative for chest pain, palpitations, orthopnea and leg swelling.  Gastrointestinal:  Negative for abdominal pain, constipation, diarrhea, heartburn, nausea and vomiting.  Genitourinary:  Negative for dysuria, frequency and urgency.   Musculoskeletal:  Negative for falls.  Skin:  Negative for rash.  Neurological:  Negative for dizziness, focal weakness and headaches.  Psychiatric/Behavioral:  Negative for depression.        has a past medical history of Allergies, Anxiety, Aortic atherosclerosis (HCC), Bipolar affective disorder (HCC), COPD (chronic obstructive pulmonary disease) (HCC), Coronary artery calcification seen on CT scan, Depression, Diastolic dysfunction (02/24/2020), DOE (dyspnea on exertion), Emphysema lung (HCC), History of cataract, HLD (hyperlipidemia), Hypertension, Macular degeneration, Migraines, Osteoporosis, Pneumonia, and Tobacco use.  No current facility-administered medications on file prior to encounter.   Current Outpatient Medications on File Prior to Encounter  Medication Sig Dispense Refill   albuterol (VENTOLIN HFA) 108 (90 Base) MCG/ACT inhaler Inhale 2 puffs into the lungs every 6 (six) hours as needed for wheezing or shortness of breath. 8 g 2   amiodarone (PACERONE) 200 MG tablet Take 2 tabs twice a day to start. On 1/28 start taking 1 tab twice a day. On 2/4 start taking one tab once a day 90 tablet 1   apixaban (ELIQUIS) 5 MG TABS tablet Take 1 tablet (5 mg total) by mouth 2 (two) times daily. 60 tablet 3   Calcium Carbonate (CALCIUM 500 PO) Take 500 mg by mouth daily at 12 noon.     Cholecalciferol (VITAMIN D3) 1.25 MG (50000 UT) CAPS Take 5,000 Units by mouth daily  in the afternoon.     Dupilumab 300 MG/2ML SOAJ Inject 300 mg into the skin every 14 (fourteen) days. Saturday     EPINEPHrine 0.3 mg/0.3 mL IJ SOAJ injection Inject 0.3 mg into the muscle as needed for anaphylaxis.     ibuprofen (ADVIL) 200 MG tablet Take 400 mg by mouth every 6 (six) hours as needed for headache.     Ipratropium-Albuterol (COMBIVENT RESPIMAT) 20-100 MCG/ACT AERS respimat Inhale 1 puff into the lungs 2 (two) times daily.     ipratropium-albuterol (DUONEB) 0.5-2.5 (3) MG/3ML SOLN Take 3 mLs by nebulization  2 (two) times daily. Mix with budesonide     metoprolol succinate (TOPROL-XL) 50 MG 24 hr tablet Take 1 tablet (50 mg total) by mouth 2 (two) times daily. Take with or immediately following a meal. 90 tablet 3   Multiple Vitamins-Minerals (PRESERVISION AREDS 2+MULTI VIT PO) Take 2 tablets by mouth daily.     OHTUVAYRE 3 MG/2.5ML SUSP Take 3 mg by nebulization daily.     omeprazole (PRILOSEC) 40 MG capsule Take 40 mg by mouth daily as needed (Heartburn).     pravastatin (PRAVACHOL) 10 MG tablet Take 10 mg by mouth daily.     predniSONE (DELTASONE) 10 MG tablet 4 tabs daily for 3 days then 3 tabs daily for 3 days then 2 tabs daily for 3 days then 1 tab daily for 3 days 30 tablet 0   sertraline (ZOLOFT) 100 MG tablet Take 100 mg by mouth daily.     sulfamethoxazole-trimethoprim (BACTRIM) 400-80 MG tablet Take 1 tablet by mouth 3 (three) times a week. Monday, Wednesday and Friday       Allergies  Allergen Reactions   Bupropion Other (See Comments)    Foggy brain      family history includes Breast cancer (age of onset: 65) in her mother; Heart Problems in her maternal uncle, paternal aunt, and paternal uncle.  Past Surgical History:  Procedure Laterality Date   CATARACT EXTRACTION     COLONOSCOPY N/A 08/30/2021   Procedure: COLONOSCOPY;  Surgeon: Jaynie Collins, DO;  Location: [facility] ENDOSCOPY;  Service: Gastroenterology;  Laterality: N/A;   DILATION AND CURETTAGE OF UTERUS     TEE WITHOUT CARDIOVERSION N/A 04/20/2023   Procedure: TRANSESOPHAGEAL ECHOCARDIOGRAM;  Surgeon: Antonieta Iba, MD;  Location: [facility] ORS;  Service: Cardiovascular;  Laterality: N/A;   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   Procedure: TOTAL HIP ARTHROPLASTY;  Surgeon: Donato Heinz, MD;  Location: [facility] ORS;  Service: Orthopedics;  Laterality: Right;   WISDOM TOOTH EXTRACTION     WRIST GANGLION EXCISION            Objective Findings:  Vitals:   06/29/23 0900 06/29/23 1100 06/29/23 1145 06/29/23  1230  BP: 137/89 (!) 157/72 (!) 128/111 (!) 148/95  Pulse: 64 65 67 69  Resp: 19 (!) 28 (!) 24 (!) 29  Temp:    97.9 F (36.6 C)  TempSrc:    Axillary  SpO2: 100% 99% 93% 91%  Weight:      Height:       No intake or output data in the 24 hours ending 06/29/23 1247 Filed Weights   06/29/23 0742  Weight: 50 kg    Examination:  Physical Exam Constitutional:      Appearance: She is ill-appearing (increased WOB).  HENT:     Mouth/Throat:     Mouth: Mucous membranes are dry.  Cardiovascular:     Rate and Rhythm: Normal rate and  regular rhythm.  Pulmonary:     Effort: Tachypnea and accessory muscle usage present.     Breath sounds: Decreased air movement present. Rales (very faint at bases) present.  Chest:     Chest wall: No tenderness.  Abdominal:     General: Abdomen is flat.     Palpations: Abdomen is soft.     Tenderness: There is no abdominal tenderness.  Musculoskeletal:     Right lower leg: No edema.     Left lower leg: No edema.  Skin:    General: Skin is warm and dry.  Neurological:     General: No focal deficit present.     Mental Status: She is alert and oriented to person, place, and time. Mental status is at baseline.  Psychiatric:        Mood and Affect: Mood normal.        Behavior: Behavior normal.          Scheduled Medications:   budesonide (PULMICORT) nebulizer solution  2 mg Nebulization Q12H   enoxaparin (LOVENOX) injection  40 mg Subcutaneous Q24H   [START ON 06/30/2023] predniSONE  40 mg Oral Q breakfast   umeclidinium-vilanterol  1 puff Inhalation Daily    Continuous Infusions:   PRN Medications:  albuterol, sodium chloride flush  Antimicrobials:  Anti-infectives (From admission, onward)    Start     Dose/Rate Route Frequency Ordered Stop   06/29/23 0900  vancomycin (VANCOCIN) IVPB 1000 mg/200 mL premix        1,000 mg 200 mL/hr over 60 Minutes Intravenous  Once 06/29/23 0846 06/29/23 1232   06/29/23 0900  ceFEPIme (MAXIPIME)  2 g in sodium chloride 0.9 % 100 mL IVPB        2 g 200 mL/hr over 30 Minutes Intravenous  Once 06/29/23 0846 06/29/23 1020   06/29/23 0900  azithromycin (ZITHROMAX) 500 mg in sodium chloride 0.9 % 250 mL IVPB        500 mg 250 mL/hr over 60 Minutes Intravenous  Once 06/29/23 0846 06/29/23 1051           Data Reviewed: I have personally reviewed following labs and imaging studies  CBC: Recent Labs  Lab 06/24/23 0534 06/25/23 0436 06/26/23 0900 06/27/23 0414 06/29/23 0748  WBC 18.5* 21.0* 25.4* 19.8* 21.8*  NEUTROABS 14.0*  --   --   --  17.7*  HGB 12.5 11.4* 12.6 12.0 14.0  HCT 39.9 34.2* 40.2 36.9 46.0  MCV 108.7* 102.7* 109.5* 103.7* 114.1*  PLT 444* 385 478* 378 429*   Basic Metabolic Panel: Recent Labs  Lab 06/25/23 0435 06/25/23 0436 06/25/23 1409 06/26/23 0243 06/27/23 0414 06/28/23 0422 06/29/23 0748  NA  --  138  --  137 139 141 140  K  --  2.9* 4.0 4.0 3.6 3.8 4.3  CL  --  98  --  96* 100 105 103  CO2  --  26  --  26 26 27 23   GLUCOSE  --  106*  --  176* 225* 156* 188*  BUN  --  22  --  33* 40* 29* 32*  CREATININE  --  0.67  --  1.05* 1.08* 0.77 0.91  CALCIUM  --  8.0*  --  8.2* 7.9* 8.3* 8.1*  MG 2.2  --   --   --   --   --   --    GFR: Estimated Creatinine Clearance: 41.5 mL/min (by C-G formula based on SCr of 0.91 mg/dL).  Liver Function Tests: Recent Labs  Lab 06/24/23 0534 06/25/23 0436 06/29/23 0748  AST 97* 42* 60*  ALT 86* 61* 214*  ALKPHOS 68 63 74  BILITOT 0.8 1.0 1.2  PROT 6.4* 6.0* 6.7  ALBUMIN 3.4* 3.4* 3.4*   No results for input(s): "LIPASE", "AMYLASE" in the last 168 hours. No results for input(s): "AMMONIA" in the last 168 hours. Coagulation Profile: Recent Labs  Lab 06/24/23 0534  INR 1.4*   Cardiac Enzymes: No results for input(s): "CKTOTAL", "CKMB", "CKMBINDEX", "TROPONINI" in the last 168 hours. BNP (last 3 results) No results for input(s): "PROBNP" in the last 8760 hours. HbA1C: No results for input(s):  "HGBA1C" in the last 72 hours. CBG: Recent Labs  Lab 06/27/23 1138 06/27/23 1658 06/27/23 2122 06/28/23 0810 06/28/23 1110  GLUCAP 142* 165* 167* 128* 137*   Lipid Profile: No results for input(s): "CHOL", "HDL", "LDLCALC", "TRIG", "CHOLHDL", "LDLDIRECT" in the last 72 hours. Thyroid Function Tests: No results for input(s): "TSH", "T4TOTAL", "FREET4", "T3FREE", "THYROIDAB" in the last 72 hours. Anemia Panel: No results for input(s): "VITAMINB12", "FOLATE", "FERRITIN", "TIBC", "IRON", "RETICCTPCT" in the last 72 hours. Most Recent Urinalysis On File:     Component Value Date/Time   COLORURINE YELLOW (A) 10/28/2021 1425   APPEARANCEUR HAZY (A) 10/28/2021 1425   LABSPEC 1.026 10/28/2021 1425   PHURINE 5.0 10/28/2021 1425   GLUCOSEU NEGATIVE 10/28/2021 1425   HGBUR NEGATIVE 10/28/2021 1425   BILIRUBINUR NEGATIVE 10/28/2021 1425   KETONESUR 5 (A) 10/28/2021 1425   PROTEINUR 30 (A) 10/28/2021 1425   NITRITE NEGATIVE 10/28/2021 1425   LEUKOCYTESUR NEGATIVE 10/28/2021 1425   Sepsis Labs: @LABRCNTIP (procalcitonin:4,lacticidven:4)  Recent Results (from the past 240 hours)  Blood culture (routine x 2)     Status: None   Collection Time: 06/24/23  5:34 AM   Specimen: BLOOD  Result Value Ref Range Status   Specimen Description BLOOD BLOOD RIGHT ARM  Final   Special Requests   Final    BOTTLES DRAWN AEROBIC AND ANAEROBIC Blood Culture results may not be optimal due to an inadequate volume of blood received in culture bottles   Culture   Final    NO GROWTH 5 DAYS Performed at [facility], [address] [place], [place] [address]    Report Status 06/29/2023 FINAL  Final  Blood culture (routine x 2)     Status: None   Collection Time: 06/24/23  5:43 AM   Specimen: BLOOD  Result Value Ref Range Status   Specimen Description BLOOD BLOOD RIGHT FOREARM  Final   Special Requests   Final    BOTTLES DRAWN AEROBIC ONLY Blood Culture results may not be optimal due to an  inadequate volume of blood received in culture bottles   Culture   Final    NO GROWTH 5 DAYS Performed at [facility], [address] [place], [place] [address]    Report Status 06/29/2023 FINAL  Final  Resp panel by RT-PCR (RSV, Flu A&B, Covid) Anterior Nasal Swab     Status: None   Collection Time: 06/24/23  5:43 AM   Specimen: Anterior Nasal Swab  Result Value Ref Range Status   SARS Coronavirus 2 by RT PCR NEGATIVE NEGATIVE Final    Comment: (NOTE) SARS-CoV-2 target nucleic acids are NOT DETECTED.  The SARS-CoV-2 RNA is generally detectable in upper respiratory specimens during the acute phase of infection. The lowest concentration of SARS-CoV-2 viral copies this assay can detect is 138 copies/mL. A negative result does  not preclude SARS-Cov-2 infection and should not be used as the sole basis for treatment or other patient management decisions. A negative result may occur with  improper specimen collection/handling, submission of specimen other than nasopharyngeal swab, presence of viral mutation(s) within the areas targeted by this assay, and inadequate number of viral copies(<138 copies/mL). A negative result must be combined with clinical observations, patient history, and epidemiological information. The expected result is Negative.  Fact Sheet for Patients:  BloggerCourse.com  Fact Sheet for Healthcare Providers:  SeriousBroker.it  This test is no t yet approved or cleared by the [place] FDA and  has been authorized for detection and/or diagnosis of SARS-CoV-2 by FDA under an Emergency Use Authorization (EUA). This EUA will remain  in effect (meaning this test can be used) for the duration of the COVID-19 declaration under Section 564(b)(1) of the Act, 21 U.S.C.section 360bbb-3(b)(1), unless the authorization is terminated  or revoked sooner.       Influenza A by PCR NEGATIVE NEGATIVE Final    Influenza B by PCR NEGATIVE NEGATIVE Final    Comment: (NOTE) The Xpert Xpress SARS-CoV-2/FLU/RSV plus assay is intended as an aid in the diagnosis of influenza from Nasopharyngeal swab specimens and should not be used as a sole basis for treatment. Nasal washings and aspirates are unacceptable for Xpert Xpress SARS-CoV-2/FLU/RSV testing.  Fact Sheet for Patients: BloggerCourse.com  Fact Sheet for Healthcare Providers: SeriousBroker.it  This test is not yet approved or cleared by the [place] FDA and has been authorized for detection and/or diagnosis of SARS-CoV-2 by FDA under an Emergency Use Authorization (EUA). This EUA will remain in effect (meaning this test can be used) for the duration of the COVID-19 declaration under Section 564(b)(1) of the Act, 21 U.S.C. section 360bbb-3(b)(1), unless the authorization is terminated or revoked.     Resp Syncytial Virus by PCR NEGATIVE NEGATIVE Final    Comment: (NOTE) Fact Sheet for Patients: BloggerCourse.com  Fact Sheet for Healthcare Providers: SeriousBroker.it  This test is not yet approved or cleared by the [place] FDA and has been authorized for detection and/or diagnosis of SARS-CoV-2 by FDA under an Emergency Use Authorization (EUA). This EUA will remain in effect (meaning this test can be used) for the duration of the COVID-19 declaration under Section 564(b)(1) of the Act, 21 U.S.C. section 360bbb-3(b)(1), unless the authorization is terminated or revoked.  Performed at [facility], [address] [place], [place] [address]   MRSA Next Gen by PCR, Nasal     Status: None   Collection Time: 06/25/23  8:34 AM   Specimen: Nasal Mucosa; Nasal Swab  Result Value Ref Range Status   MRSA by PCR Next Gen NOT DETECTED NOT DETECTED Final    Comment: (NOTE) The GeneXpert MRSA Assay (FDA approved for NASAL  specimens only), is one component of a comprehensive MRSA colonization surveillance program. It is not intended to diagnose MRSA infection nor to guide or monitor treatment for MRSA infections. Test performance is not FDA approved in patients less than 57 years old. Performed at [facility], [address] [place], [place] [address]   Respiratory (~20 pathogens) panel by PCR     Status: None   Collection Time: 06/25/23  9:24 AM   Specimen: Nasopharyngeal Swab; Respiratory  Result Value Ref Range Status   Adenovirus NOT DETECTED NOT DETECTED Final   Coronavirus 229E NOT DETECTED NOT DETECTED Final    Comment: (NOTE) The Coronavirus on the Respiratory Panel, DOES  NOT test for the novel  Coronavirus (2019 nCoV)    Coronavirus HKU1 NOT DETECTED NOT DETECTED Final   Coronavirus NL63 NOT DETECTED NOT DETECTED Final   Coronavirus OC43 NOT DETECTED NOT DETECTED Final   Metapneumovirus NOT DETECTED NOT DETECTED Final   Rhinovirus / Enterovirus NOT DETECTED NOT DETECTED Final   Influenza A NOT DETECTED NOT DETECTED Final   Influenza B NOT DETECTED NOT DETECTED Final   Parainfluenza Virus 1 NOT DETECTED NOT DETECTED Final   Parainfluenza Virus 2 NOT DETECTED NOT DETECTED Final   Parainfluenza Virus 3 NOT DETECTED NOT DETECTED Final   Parainfluenza Virus 4 NOT DETECTED NOT DETECTED Final   Respiratory Syncytial Virus NOT DETECTED NOT DETECTED Final   Bordetella pertussis NOT DETECTED NOT DETECTED Final   Bordetella Parapertussis NOT DETECTED NOT DETECTED Final   Chlamydophila pneumoniae NOT DETECTED NOT DETECTED Final   Mycoplasma pneumoniae NOT DETECTED NOT DETECTED Final    Comment: Performed at [facility], [address] [place], [place] [address]         Radiology Studies: DG Chest Port 1 View Result Date: 06/29/2023 CLINICAL DATA:  Questionable sepsis - evaluate for abnormality EXAM: PORTABLE CHEST 1 VIEW COMPARISON:  CT chest 06/24/2023, chest x-ray  06/26/2023 FINDINGS: The heart and mediastinal contours are within normal limits. Atherosclerotic plaque. Slight interval worsening of patchy airspace opacities most prominent along the left upper lobe. Question interstitial markings with no overt pulmonary edema. Flattening of the hemidiaphragms with blunting of the costophrenic angles and likely trace pleural effusions. No pneumothorax. No acute osseous abnormality. IMPRESSION: 1. Slight interval worsening of patchy airspace opacities most prominent along the left upper lobe. Findings suggest infection. Followup PA and lateral chest X-ray is recommended in 3-4 weeks following therapy to ensure resolution. 2. Bilateral trace pleural effusions. 3. Aortic Atherosclerosis (ICD10-I70.0) and Emphysema (ICD10-J43.9). Electronically Signed   By: Tish Frederickson M.D.   On: 06/29/2023 08:05   ECHOCARDIOGRAM COMPLETE Result Date: 06/27/2023    ECHOCARDIOGRAM REPORT   Patient Name:   RAYNELLE BUSKE Date of Exam: 06/26/2023 Medical Rec #:  [id number]   Height:       62.0 in Accession #:    [id number]  Weight:       111.1 lb Date of Birth:  [birth year]   BSA:          1.489 m Patient Age:    77 years    BP:           146/88 mmHg Patient Gender: F           HR:           76 bpm. Exam Location:  [facility] Procedure: 2D Echo, Cardiac Doppler and Color Doppler Indications:     Elevated Troponin  History:         Patient has prior history of Echocardiogram examinations, most                  recent 01/26/2023. COPD, Signs/Symptoms:Dyspnea; Risk                  Factors:Hypertension, Dyslipidemia and Former Smoker.                  Emphysema.  Sonographer:     Daphine Deutscher RDCS Referring Phys:  [id number] Francee Nodal FURTH Diagnosing Phys: Cristal Deer End MD IMPRESSIONS  1. Left ventricular ejection fraction, by estimation, is 55 to 60%. The left ventricle has normal function. The left ventricle has  no regional wall motion abnormalities. Left ventricular diastolic parameters are  consistent with Grade II diastolic dysfunction (pseudonormalization).  2. Right ventricular systolic function is normal. The right ventricular size is normal. There is moderately elevated pulmonary artery systolic pressure.  3. The mitral valve is degenerative. Moderate to severe mitral valve regurgitation. No evidence of mitral stenosis.  4. Tricuspid valve regurgitation is mild to moderate.  5. The aortic valve is tricuspid. There is mild thickening of the aortic valve. Aortic valve regurgitation is trivial. Aortic valve sclerosis/calcification is present, without any evidence of aortic stenosis.  6. The inferior vena cava is normal in size with <50% respiratory variability, suggesting right atrial pressure of 8 mmHg. FINDINGS  Left Ventricle: Left ventricular ejection fraction, by estimation, is 55 to 60%. The left ventricle has normal function. The left ventricle has no regional wall motion abnormalities. The left ventricular internal cavity size was normal in size. There is  no left ventricular hypertrophy. Left ventricular diastolic parameters are consistent with Grade II diastolic dysfunction (pseudonormalization). Right Ventricle: The right ventricular size is normal. No increase in right ventricular wall thickness. Right ventricular systolic function is normal. There is moderately elevated pulmonary artery systolic pressure. The tricuspid regurgitant velocity is 3.36 m/s, and with an assumed right atrial pressure of 8 mmHg, the estimated right ventricular systolic pressure is 53.2 mmHg. Left Atrium: Left atrial size was normal in size. Right Atrium: Right atrial size was normal in size. Pericardium: There is no evidence of pericardial effusion. Mitral Valve: The mitral valve is degenerative in appearance. There is moderate thickening of the mitral valve leaflet(s). Moderate to severe mitral valve regurgitation. No evidence of mitral valve stenosis. Tricuspid Valve: The tricuspid valve is normal in  structure. Tricuspid valve regurgitation is mild to moderate. Aortic Valve: The aortic valve is tricuspid. There is mild thickening of the aortic valve. There is mild to moderate aortic valve annular calcification. Aortic valve regurgitation is trivial. Aortic valve sclerosis/calcification is present, without any evidence of aortic stenosis. Aortic valve mean gradient measures 5.6 mmHg. Aortic valve peak gradient measures 10.3 mmHg. Aortic valve area, by VTI measures 0.78 cm. Pulmonic Valve: The pulmonic valve was grossly normal. Pulmonic valve regurgitation is trivial. No evidence of pulmonic stenosis. Aorta: The aortic root is normal in size and structure. Pulmonary Artery: The pulmonary artery is not well seen. Venous: The inferior vena cava is normal in size with less than 50% respiratory variability, suggesting right atrial pressure of 8 mmHg. IAS/Shunts: No atrial level shunt detected by color flow Doppler.  LEFT VENTRICLE PLAX 2D LVIDd:         4.60 cm   Diastology LVIDs:         3.00 cm   LV e' medial:    7.58 cm/s LV PW:         0.50 cm   LV E/e' medial:  14.1 LV IVS:        0.50 cm   LV e' lateral:   7.51 cm/s LVOT diam:     1.50 cm   LV E/e' lateral: 14.3 LV SV:         24 LV SV Index:   16 LVOT Area:     1.77 cm  RIGHT VENTRICLE             IVC RV Basal diam:  3.20 cm     IVC diam: 1.70 cm RV S prime:     12.67 cm/s TAPSE (M-mode): 3.0 cm LEFT ATRIUM  Index        RIGHT ATRIUM          Index LA diam:      4.10 cm 2.75 cm/m   RA Area:     9.39 cm LA Vol (A2C): 44.6 ml 29.95 ml/m  RA Volume:   19.10 ml 12.83 ml/m LA Vol (A4C): 28.5 ml 19.14 ml/m  AORTIC VALVE AV Area (Vmax):    0.74 cm AV Area (Vmean):   0.71 cm AV Area (VTI):     0.78 cm AV Vmax:           160.47 cm/s AV Vmean:          113.058 cm/s AV VTI:            0.303 m AV Peak Grad:      10.3 mmHg AV Mean Grad:      5.6 mmHg LVOT Vmax:         67.10 cm/s LVOT Vmean:        45.267 cm/s LVOT VTI:          0.134 m LVOT/AV VTI ratio:  0.44  AORTA Ao Root diam: 2.40 cm MITRAL VALVE                TRICUSPID VALVE MV Area (PHT): 4.70 cm     TR Peak grad:   45.2 mmHg MV Decel Time: 162 msec     TR Vmax:        336.00 cm/s MR Peak grad: 85.4 mmHg MR Mean grad: 65.5 mmHg     SHUNTS MR Vmax:      462.00 cm/s   Systemic VTI:  0.13 m MR Vmean:     395.5 cm/s    Systemic Diam: 1.50 cm MV E velocity: 107.00 cm/s MV A velocity: 70.60 cm/s MV E/A ratio:  1.52 Cristal Deer End MD Electronically signed by Yvonne Kendall MD Signature Date/Time: 06/27/2023/7:23:05 AM    Final    DG Chest Port 1 View Result Date: 06/26/2023 CLINICAL DATA:  Dyspnea.  History of COPD. EXAM: PORTABLE CHEST 1 VIEW COMPARISON:  Chest radiograph and CTA chest dated June 24, 2023. FINDINGS: Stable cardiomediastinal silhouette. Aortic atherosclerosis. Hyperinflation with emphysematous changes, compatible with COPD. Persistent small bilateral pleural effusions with basilar atelectasis. Similar left-sided basilar predominant interstitial changes. No pneumothorax. The visualized osseous structures are unchanged. IMPRESSION: 1. Persistent small bilateral pleural effusions with basilar atelectasis. 2. Similar left basilar predominant interstitial opacities, could reflect a combination of infiltrate, asymmetric edema, and/or atelectasis. Electronically Signed   By: Hart Robinsons M.D.   On: 06/26/2023 09:23             LOS: 0 days    Time spent: 75 min    Sunnie Nielsen, DO [facility] 06/29/2023, 12:47 PM    Dictation software may have been used to generate the above note. Typos may occur and escape review in typed/dictated notes. Please contact Dr Lyn Hollingshead directly for clarity if needed.  Staff may message me via secure chat in Epic  but this may not receive an immediate response,  please page me for urgent matters!  If 7PM-7AM, please contact night coverage [email or web address]

## 2023-06-29 NOTE — ED Triage Notes (Addendum)
Pt presents to ED via [facility] from home with resp distress. Pt presents on c-pap by EMS. Pt recently D/C'ed from ICU yesterday.    125mg  of solumedrol given by EMS  Pt currently on prednisone orally.    Pt does have pretty significant WOB noted on arrival, pt denies fevers or chills, pt states she felt well yesterday.

## 2023-06-29 NOTE — ED Notes (Signed)
Pt noted to have feet on bottom of stretcher pt spouse states @ RN station "can you lift her she's falling out of the bottom?" Presenter, broadcasting requests assistance from nearby RN x2 as ED tech unavailable x6

## 2023-06-29 NOTE — ED Notes (Signed)
Pt hypertensive and on bi-pap at this time, PO daily meds ordered, MD aware and this RN asked for IV meds due to bipap.

## 2023-06-29 NOTE — ED Notes (Signed)
Repeat trop, LA, and VBG sent to lab, resp made aware of VBG being sent

## 2023-06-29 NOTE — Sepsis Progress Note (Signed)
Sepsis protocol monitored by eLink ?

## 2023-06-29 NOTE — Consult Note (Signed)
Pharmacy Antibiotic Note  Taylor Chambers is a 78 y.o. female admitted on 06/29/2023 with pneumonia. They were recently discharge on 1/22 for acute respiratory failure, HFpEF exacerbation and pneumonia. They were readmitted today for respiratory distress. Pharmacy has been consulted for Vancomycin and Cefepime dosing.  Today, 06/29/2023 Scr 0.91 (baseline) -> 41.5 mL/min Afebrile/24 hrs Lactic acid: 1/7 >> 3.2 Procal: 0.26 1/23 Imaging: Slight interval worsening of patchy airspace opacities most prominent along the left upper lobe. Findings suggest infection  Plan: Day 6 of antibiotics (from previous admission); D1 vancomycin  Patient given LD vancomycin 1000 mg IV x1  Start vancomycin 750 mg IV Q24H. Goal AUC 400-550. Expected AUC: 536.2 Expected Css min: 14.1 SCr used: 0.91  Weight used: TBW, Vd used: 0.72 (BMI 20.16) Patient is also on Cefepime 2 g IV Q12H - renally adjusted  Continue to monitor renal function and follow culture results  Height: 5\' 2"  (157.5 cm) Weight: 50 kg (110 lb 3.7 oz) IBW/kg (Calculated) : 50.1  Temp (24hrs), Avg:97.7 F (36.5 C), Min:97.5 F (36.4 C), Max:97.9 F (36.6 C)  Recent Labs  Lab 06/24/23 0534 06/24/23 0723 06/25/23 0436 06/26/23 0243 06/26/23 0900 06/27/23 0414 06/28/23 0422 06/29/23 0748 06/29/23 1019  WBC 18.5*  --  21.0*  --  25.4* 19.8*  --  21.8*  --   CREATININE 0.87  --  0.67 1.05*  --  1.08* 0.77 0.91  --   LATICACIDVEN 4.6* 3.2*  --  3.1*  --   --   --  3.2* 1.7    Estimated Creatinine Clearance: 41.5 mL/min (by C-G formula based on SCr of 0.91 mg/dL).    Allergies  Allergen Reactions   Bupropion Other (See Comments)    Foggy brain   Antimicrobials this admission: Cefepime  >>  Vancomycin  >>   Dose adjustments this admission: NA  Microbiology results: 1/23 BCx: in process 1/23 Strep pneumoniae: in process 1/23 Legionella: in process 1/23 MRSA nares: needs to be collected  1/23 Respiratory: needs to be  collected   Thank you for allowing pharmacy to be a part of this patient's care.  Effie Shy, PharmD Pharmacy Resident  06/29/2023 5:36 PM

## 2023-06-29 NOTE — Hospital Course (Addendum)
Hospital course / significant events:   HPI: Taylor Chambers is a 78 y.o. female with medical history significant of COPD on 2-3L home O2 [place] and OSA w/ CPAP qhs, CAD, atrial fibrillation, HFpEF w/ Grade II diastolic dysfunction, mitral insufficiency, hyperlipidemia, hypertension, tobacco abuse. She was admitted recently 06/24/23 and discharged 01/22 (day prior to this admission) following treatment for COPD exacerbation, Afib RVR, HFpEF exacerbation all resulting in acute hypercarbic respiratory failure on chronic hypoxic respiratory failure, requiring BiPap. 06/29/23 presented to ED from home via EMS chief complaint SOB. She states 01/22 she felt fine when home from the hospital, slept okay overnight and compliant w/ CPAP and other meds, awoke and felt okay until relatively sudden worsening SOB when ambulating to the bathroom.   01/23: admitted for resp failure again requiring BiPap support. Tx for HCAP, COPD, and question component of HFpEF. Remained in BiPap thru evening and overnight.  01/24: in BiPap this morning then to [place] but still increased WOB, back on BiPap. Pulmonary consult - CHF seems more likely, initiate diuresis, also ordered for thoracentesis which yielded 300 cc. Palliative care following.  01/25: diuresing well  01/26: contraction alkalosis, holding diuretics. 2L [place] while awake, still on BiPap qhs     Consultants:  Pulmonology Palliative care   Procedures/Surgeries: 01/24 - R thoracentesis yield 300 cc       ASSESSMENT & PLAN:   Acute on chronic respiratory failure with hypoxia and hypercapnia Supplemental O2 BiPAP --> [place] VBG/ABG as needed if worsening  Underlying causes as below - concern for COPD / HCAP / CHF    COPD w/ (+)SIRS d/t COPD exacerbation Hx CAP recent admission Question HCAP now, in which case (+)Severe Sepsis d/t HCAP CXR not convincing for pneumonia but given leukocytosis w/ L shift and resp distress, recent hospitalization, there is concern for  HCAP Abx: cefepime + vancomycin - today 07/02/23 is day 4 LABA/LAMA Anoro  ICS budesonide nebs  SABA prn albuterol nebs S/p SoluMedrol, resume prednisone   Supplemental O2 BiPap at bedtime / as needed Follow Sputum culture --> never collected  Follow Resp PCR --> never collected Follow Legionella Ag --> negative Follow Strep pneumo Ag --> negative  VBG/ABG as needed if worsening  Follow Procalcitonin added on, if normal will consider d/c abx --> borderline, continue abx for now  Follow MRSA screen, if negative will consider d/c Vanc --> negative, will d/c vancomycin  Given readmission, will consult pulmonary Low threshold for further imaging    Compressive atelectasis  metaneb BID with RT incentive spirometry  flutter valve   HFpEF exacerbation  high BNP and pt was not sent home on diuretics last admission No LE edema, but BNP w/ CXR infiltrate and faint rales which would also support CHF diagnosis  Cautious diuresis - holding now d/t substantial contraction alkalosis  Will not repeat Echo given recently done Strict I&O --> incomplete documentation early admission  Continue telemetry  Consider cardiology consult pending clinical course  Follow repeat CXR after thoracentesis --> pleural effusion on L appears worse compared to CXR 01/20 Trend BNP --> very slight improvement, suspect likely chronically elevated and would be helpful to know measurement at stable baseline, consider this outpatient   Contraction alkalosis Holding diuresis today   Pleural effusion S/p R Thoracentesis  Follow repeat CXR after thoracentesis --> pleural effusion on L appears worse compared to CXR 01/20  CAD Elevated troponin  No chest pain. Suspect troponin d/t demand/supply mismatch given respiratory distress EKG for chest pain /  as needed Telemetry  Continue Eliquis, beta blocker Holding statin d/t transaminitis  Holding ACE/ARB d/t soft BP    Advanced care planning  Discussed code status -  pt and family wish for full code Palliative care following    Transaminitis  ALT 214 on admission, 4-5 days ago was 60-80. Values normal 1 year prior. Question shock liver d/t hypoperfusion Monitor CMP --> improving If not improving, broaden workup   Paroxysmal Afib Metoprolol at lower dose (50 mg --> 25 mg) given soft BP Continue home amiodarone (400 mg bid for now, then 07/04/23 can start 200 mg bid if she is still in hospital, see cardiology note 01/22)  Continue home Eliquis  Continue telemetry    GERD Continue home PPI   Anxiety/Depression Continue home Zoloft Very anxious on BiPap --> ativan IV low dose prn          DVT prophylaxis: Eliquis IV fluids: no continuous IV fluids  Nutrition: low salt cardiac diet  Central lines / invasive devices: none  Code Status: FULL CODE  ACP documentation reviewed: none on file in [facility]  TOC needs: TBD Barriers to dispo / significant pending items: resp fail, diuresing

## 2023-06-29 NOTE — ED Provider Notes (Signed)
[facility] Provider Note    Event Date/Time   First MD Initiated Contact with Patient 06/29/23 [phone]     (approximate)   History   Respiratory Distress   HPI  Taylor Chambers is a 78 year old female with history of COPD, CAD, A-fib, CHF presenting to the emergency department for evaluation of shortness of breath.  Recently discharged yesterday after presenting with hypoxia, CHF and COPD exacerbation, pneumonia, sepsis.  This morning, patient had recurrent worsening shortness of breath.  She was placed on CPAP by EMS.  She was given 125 mg of Solu-Medrol.  No reported fevers or chills.  Patient presented with hypercarbic hypoxic respiratory failure, did require BiPAP during admission.  She was given antibiotics transition to Levaquin at time of discharge as well as a steroid taper.  She was also started on amiodarone.  Was stable on her home oxygen at time of discharge yesterday.     Physical Exam   Triage Vital Signs: ED Triage Vitals  Encounter Vitals Group     BP 06/29/23 0740 (!) 141/105     Systolic BP Percentile --      Diastolic BP Percentile --      Pulse Rate 06/29/23 0740 72     Resp 06/29/23 0740 (!) 30     Temp 06/29/23 0804 (!) 97.5 F (36.4 C)     Temp Source 06/29/23 0804 Rectal     SpO2 06/29/23 0739 100 %     Weight 06/29/23 0742 110 lb 3.7 oz (50 kg)     Height 06/29/23 0742 5\' 2"  (1.575 m)     Head Circumference --      Peak Flow --      Pain Score 06/29/23 0741 0     Pain Loc --      Pain Education --      Exclude from Growth Chart --     Most recent vital signs: Vitals:   06/29/23 1400 06/29/23 1502  BP: (!) 153/101 (!) 158/75  Pulse: 66 65  Resp: (!) 22 19  Temp:    SpO2: 100% 99%     General: Awake, interactive  CV:  Regular rate, good peripheral perfusion.  Resp:  Tachypneic with labored respirations, possible crackles on the right, diminished with poor air movement and tight lung sounds bilaterally   Abd:  Nondistended.  Neuro:  Symmetric facial movement, fluid speech   ED Results / Procedures / Treatments   Labs (all labs ordered are listed, but only abnormal results are displayed) Labs Reviewed  LACTIC ACID, PLASMA - Abnormal; Notable for the following components:      Result Value   Lactic Acid, Venous 3.2 (*)    All other components within normal limits  COMPREHENSIVE METABOLIC PANEL - Abnormal; Notable for the following components:   Glucose, Bld 188 (*)    BUN 32 (*)    Calcium 8.1 (*)    Albumin 3.4 (*)    AST 60 (*)    ALT 214 (*)    All other components within normal limits  BLOOD GAS, VENOUS - Abnormal; Notable for the following components:   pH, Ven 7.13 (*)    pCO2, Ven 80 (*)    Acid-base deficit 4.5 (*)    All other components within normal limits  BRAIN NATRIURETIC PEPTIDE - Abnormal; Notable for the following components:   B Natriuretic Peptide 1,525.9 (*)    All other components within normal limits  URINALYSIS, W/ REFLEX TO CULTURE (  INFECTION SUSPECTED) - Abnormal; Notable for the following components:   Color, Urine YELLOW (*)    APPearance CLOUDY (*)    Protein, ur >=300 (*)    All other components within normal limits  CBC WITH DIFFERENTIAL/PLATELET - Abnormal; Notable for the following components:   WBC 21.8 (*)    MCV 114.1 (*)    MCH 34.7 (*)    Platelets 429 (*)    nRBC 4.6 (*)    Neutro Abs 17.7 (*)    Monocytes Absolute 2.0 (*)    Basophils Absolute 0.2 (*)    Abs Immature Granulocytes 1.19 (*)    All other components within normal limits  BLOOD GAS, VENOUS - Abnormal; Notable for the following components:   pO2, Ven 72 (*)    All other components within normal limits  TROPONIN I (HIGH SENSITIVITY) - Abnormal; Notable for the following components:   Troponin I (High Sensitivity) 36 (*)    All other components within normal limits  TROPONIN I (HIGH SENSITIVITY) - Abnormal; Notable for the following components:   Troponin I (High  Sensitivity) 41 (*)    All other components within normal limits  CULTURE, BLOOD (ROUTINE X 2)  CULTURE, BLOOD (ROUTINE X 2)  RESPIRATORY PANEL BY PCR  MRSA NEXT GEN BY PCR, NASAL  EXPECTORATED SPUTUM ASSESSMENT W GRAM STAIN, RFLX TO RESP C  LACTIC ACID, PLASMA  PROCALCITONIN  CBC WITH DIFFERENTIAL/PLATELET  LEGIONELLA PNEUMOPHILA SEROGP 1 UR AG  STREP PNEUMONIAE URINARY ANTIGEN     EKG EKG independently reviewed interpreted by myself (ER attending) demonstrates:  EKG demonstrate sinus rhythm at a rate of 69, PR 127, QRS 95, QTc 488, no acute ST changes  RADIOLOGY Imaging independently reviewed and interpreted by myself demonstrates:  CXR demonstrating worsening patchy airspace opacities compared to prior concerning for infection  PROCEDURES:  Critical Care performed: Yes, see critical care procedure note(s)  CRITICAL CARE Performed by: Trinna Post   Total critical care time: 34 minutes  Critical care time was exclusive of separately billable procedures and treating other patients.  Critical care was necessary to treat or prevent imminent or life-threatening deterioration.  Critical care was time spent personally by me on the following activities: development of treatment plan with patient and/or surrogate as well as nursing, discussions with consultants, evaluation of patient's response to treatment, examination of patient, obtaining history from patient or surrogate, ordering and performing treatments and interventions, ordering and review of laboratory studies, ordering and review of radiographic studies, pulse oximetry and re-evaluation of patient's condition.   Procedures   MEDICATIONS ORDERED IN ED: Medications  sodium chloride flush (NS) 0.9 % injection 3 mL (has no administration in time range)  umeclidinium-vilanterol (ANORO ELLIPTA) 62.5-25 MCG/ACT 1 puff (1 puff Inhalation Not Given 06/29/23 1513)  albuterol (PROVENTIL) (2.5 MG/3ML) 0.083% nebulizer solution 2.5  mg (2.5 mg Nebulization Given 06/29/23 1342)  predniSONE (DELTASONE) tablet 40 mg (has no administration in time range)  budesonide (PULMICORT) nebulizer solution 2 mg (2 mg Nebulization Given 06/29/23 1342)  apixaban (ELIQUIS) tablet 5 mg (has no administration in time range)  metoprolol succinate (TOPROL-XL) 24 hr tablet 25 mg (has no administration in time range)  multivitamin-lutein (OCUVITE-LUTEIN) capsule 1 capsule (has no administration in time range)  pantoprazole (PROTONIX) EC tablet 40 mg (40 mg Oral Not Given 06/29/23 1513)  sertraline (ZOLOFT) tablet 100 mg (has no administration in time range)  amiodarone (PACERONE) tablet 400 mg (has no administration in time range)    Followed  by  amiodarone (PACERONE) tablet 200 mg (has no administration in time range)    Followed by  amiodarone (PACERONE) tablet 200 mg (has no administration in time range)  ipratropium-albuterol (DUONEB) 0.5-2.5 (3) MG/3ML nebulizer solution 6 mL (6 mLs Nebulization Not Given 06/29/23 0804)  vancomycin (VANCOCIN) IVPB 1000 mg/200 mL premix (0 mg Intravenous Stopped 06/29/23 1232)  ceFEPIme (MAXIPIME) 2 g in sodium chloride 0.9 % 100 mL IVPB (0 g Intravenous Stopped 06/29/23 1020)  azithromycin (ZITHROMAX) 500 mg in sodium chloride 0.9 % 250 mL IVPB (0 mg Intravenous Stopped 06/29/23 1051)  sodium chloride 0.9 % bolus 1,000 mL (0 mLs Intravenous Stopped 06/29/23 1100)     IMPRESSION / MDM / ASSESSMENT AND PLAN / ED COURSE  I reviewed the triage vital signs and the nursing notes.  Differential diagnosis includes, but is not limited to, pneumonia, COPD exacerbation, CHF exacerbation, ACS  Patient's presentation is most consistent with acute presentation with potential threat to life or bodily function.  78 year old female presenting with shortness of breath and respiratory distress.  Transition from CPAP to BiPAP here with improvement in her work of breathing.  Initial blood gas with significant acidosis with pH of  7.13, pCO2 of 80.  White blood cell count slightly uptrending to 21, with tachypnea does meet sepsis criteria.  X-Darnette Lampron concerning for worsening pneumonia.  Ordered for broad-spectrum antibiotics with cefepime, azithromycin, vancomycin given recent IV antibiotics.  Also ordered for a liter of IV fluids with lactic acidosis of 3.2, BNP did return elevated at 1500, so we will hold off on further fluid resuscitation for now.  Will plan to continue to monitor on BiPAP to determine if patient needs floor vs ICU admission.   Clinical Course as of 06/29/23 1606  Thu Jun 29, 2023  1118 Blood gas, venous(!) Repeat VBG fortunately much improved.  Patient transitioned off BiPAP to nasal cannula.  With her worsening pneumonia, given she is appropriate for admission.  Return to hospitalist team. [NR]  1153 Case reviewed with Dr. Lyn Hollingshead.  She will evaluate the patient for anticipated admission. [NR]    Clinical Course User Index [NR] Trinna Post, MD     FINAL CLINICAL IMPRESSION(S) / ED DIAGNOSES   Final diagnoses:  Acute respiratory failure with hypoxia and hypercapnia (HCC)  Pneumonia due to infectious organism, unspecified laterality, unspecified part of lung     Rx / DC Orders   ED Discharge Orders     None        Note:  This document was prepared using Dragon voice recognition software and may include unintentional dictation errors.   Trinna Post, MD 06/29/23 [phone]

## 2023-06-30 ENCOUNTER — Inpatient Hospital Stay: Payer: Medicare HMO

## 2023-06-30 DIAGNOSIS — J9622 Acute and chronic respiratory failure with hypercapnia: Secondary | ICD-10-CM | POA: Diagnosis not present

## 2023-06-30 DIAGNOSIS — J189 Pneumonia, unspecified organism: Secondary | ICD-10-CM | POA: Diagnosis not present

## 2023-06-30 DIAGNOSIS — J90 Pleural effusion, not elsewhere classified: Secondary | ICD-10-CM | POA: Diagnosis not present

## 2023-06-30 DIAGNOSIS — Z515 Encounter for palliative care: Secondary | ICD-10-CM | POA: Diagnosis not present

## 2023-06-30 DIAGNOSIS — J9621 Acute and chronic respiratory failure with hypoxia: Secondary | ICD-10-CM | POA: Diagnosis not present

## 2023-06-30 LAB — HEPATIC FUNCTION PANEL
ALT: 152 U/L — ABNORMAL HIGH (ref 0–44)
AST: 38 U/L (ref 15–41)
Albumin: 3.1 g/dL — ABNORMAL LOW (ref 3.5–5.0)
Alkaline Phosphatase: 60 U/L (ref 38–126)
Bilirubin, Direct: 0.2 mg/dL (ref 0.0–0.2)
Indirect Bilirubin: 0.8 mg/dL (ref 0.3–0.9)
Total Bilirubin: 1 mg/dL (ref 0.0–1.2)
Total Protein: 5.7 g/dL — ABNORMAL LOW (ref 6.5–8.1)

## 2023-06-30 LAB — LEGIONELLA PNEUMOPHILA SEROGP 1 UR AG: L. pneumophila Serogp 1 Ur Ag: NEGATIVE

## 2023-06-30 LAB — CBC
HCT: 39.5 % (ref 36.0–46.0)
Hemoglobin: 12.5 g/dL (ref 12.0–15.0)
MCH: 34 pg (ref 26.0–34.0)
MCHC: 31.6 g/dL (ref 30.0–36.0)
MCV: 107.3 fL — ABNORMAL HIGH (ref 80.0–100.0)
Platelets: 329 10*3/uL (ref 150–400)
RBC: 3.68 MIL/uL — ABNORMAL LOW (ref 3.87–5.11)
RDW: 14.7 % (ref 11.5–15.5)
WBC: 18.5 10*3/uL — ABNORMAL HIGH (ref 4.0–10.5)
nRBC: 0.8 % — ABNORMAL HIGH (ref 0.0–0.2)

## 2023-06-30 LAB — BASIC METABOLIC PANEL
Anion gap: 11 (ref 5–15)
BUN: 33 mg/dL — ABNORMAL HIGH (ref 8–23)
CO2: 26 mmol/L (ref 22–32)
Calcium: 7.8 mg/dL — ABNORMAL LOW (ref 8.9–10.3)
Chloride: 108 mmol/L (ref 98–111)
Creatinine, Ser: 0.82 mg/dL (ref 0.44–1.00)
GFR, Estimated: >60 mL/min (ref >60)
Glucose, Bld: 119 mg/dL — ABNORMAL HIGH (ref 70–99)
Potassium: 3.9 mmol/L (ref 3.5–5.1)
Sodium: 145 mmol/L (ref 135–145)

## 2023-06-30 LAB — GLUCOSE, PLEURAL OR PERITONEAL FLUID: Glucose, Fluid: 106 mg/dL

## 2023-06-30 LAB — BRAIN NATRIURETIC PEPTIDE: B Natriuretic Peptide: 1489.4 pg/mL — ABNORMAL HIGH (ref 0.0–100.0)

## 2023-06-30 LAB — LACTATE DEHYDROGENASE, PLEURAL OR PERITONEAL FLUID: LD, Fluid: 76 U/L — ABNORMAL HIGH (ref 3–23)

## 2023-06-30 MED ORDER — BUDESONIDE 0.5 MG/2ML IN SUSP
0.5000 mg | Freq: Two times a day (BID) | RESPIRATORY_TRACT | Status: DC
  Administered 2023-06-30 – 2023-07-01 (×3): 0.5 mg via RESPIRATORY_TRACT
  Filled 2023-06-30 (×3): qty 2

## 2023-06-30 MED ORDER — PROSIGHT PO TABS
1.0000 | ORAL_TABLET | Freq: Every day | ORAL | Status: DC
  Filled 2023-06-30 (×2): qty 1

## 2023-06-30 MED ORDER — LORAZEPAM 2 MG/ML IJ SOLN
0.5000 mg | Freq: Four times a day (QID) | INTRAMUSCULAR | Status: DC | PRN
  Administered 2023-06-30 (×2): 0.5 mg via INTRAVENOUS
  Filled 2023-06-30 (×2): qty 1

## 2023-06-30 MED ORDER — LIDOCAINE HCL (PF) 1 % IJ SOLN
10.0000 mL | Freq: Once | INTRAMUSCULAR | Status: AC
  Administered 2023-06-30: 10 mL via INTRADERMAL

## 2023-06-30 MED ORDER — VANCOMYCIN HCL IN DEXTROSE 750-5 MG/150ML-% IV SOLN
750.0000 mg | INTRAVENOUS | Status: DC
  Administered 2023-06-30 – 2023-07-01 (×2): 750 mg via INTRAVENOUS
  Filled 2023-06-30 (×3): qty 150

## 2023-06-30 MED ORDER — LORAZEPAM 2 MG/ML IJ SOLN
0.5000 mg | Freq: Once | INTRAMUSCULAR | Status: AC
  Administered 2023-06-30: 0.5 mg via INTRAVENOUS
  Filled 2023-06-30: qty 1

## 2023-06-30 NOTE — Progress Notes (Signed)
PROGRESS NOTE    Taylor Chambers   ZOX:[id number] DOB: 05-27-1946  DOA: 06/29/2023 Date of Service: 06/30/23 which is hospital day 1  PCP: Mick Sell, MD     Hospital course / significant events:   HPI: Taylor Chambers is a 78 y.o. female with medical history significant of COPD on 2-3L home O2 [place] and OSA w/ CPAP qhs, CAD, atrial fibrillation, HFpEF w/ Grade II diastolic dysfunction, mitral insufficiency, hyperlipidemia, hypertension, tobacco abuse. She was admitted recently 06/24/23 and discharged 01/22 (day prior to this admission) following treatment for COPD exacerbation, Afib RVR, HFpEF exacerbation all resulting in acute hypercarbic respiratory failure on chronic hypoxic respiratory failure, requiring BiPap. 06/29/23 presented to ED from home via EMS chief complaint SOB. She states 01/22 she felt fine when home from the hospital, slept okay overnight and compliant w/ CPAP and other meds, awoke and felt okay until relatively sudden worsening SOB when ambulating to the bathroom.   01/23: admitted for resp failure again requiring BiPap support. Tx for HCAP, COPD, and question component of HFpEF. Remained in BiPap thru evening and overnight  01/24: in BiPap this morning then to [place] but still increased WOB though pt reports improvement. Pulmonary to see. Palliative to see      Consultants:  Pulmonology Palliative care   Procedures/Surgeries: none      ASSESSMENT & PLAN:   Acute on chronic respiratory failure with hypoxia and hypercapnia Supplemental O2 VBG/ABG as needed if worsening    COPD w/ (+)SIRS d/t COPD exacerbation Hx CAP recent admission Question HCAP now, in which case (+)Severe Sepsis d/t HCAP CXR not convincing for pneumonia but given leukocytosis w/ L shift and resp distress, recent hospitalization, there is concern for HCAP Abx: cefepime + vancomycin LABA/LAMA Anoro  ICS budesonide nebs  SABA prn albuterol nebs S/p SoluMedrol, resume prednisone tomorrow   Supplemental O2 BiPap at bedtime / as needed Follow Sputum culture  Follow Resp PCR --> negative Follow Legionella Ag Follow Strep pneumo Ag --> negative  VBG/ABG as needed if worsening  Follow Procalcitonin added on, if normal will consider d/c abx --> borderline, continue abx for now  Follow MRSA screen, if negative will consider d/c Vanc --> negative, will d/c vancomycin  Given readmission, will consult pulmonary Low threshold for further imaging    Hx HFpEF Question HFpEF exacerbation now  Question exacerbation HFpEF, given high BNP and pt was not sent home on diuretics. CXR not convincing for edema but there is questionable infiltrate and faint rales which would support CHF diagnosis  Does not appear fluid overloaded but did receive aggressive IV fluids in ED Defer diuresis at this time - no edema/rales and soft BP Will not repeat Echo given recently done Strict I&O --> no documentation...  Continue telemetry  Consider cardiology consult pending clinical course  consider repeat CXR -->  Trend BNP --> very slight improvement, suspect chronically elevated    CAD Elevated troponin  No chest pain. Suspect troponin d/t demand/supply mismatch given respiratory distress EKG for chest pain / as needed Telemetry  Continue Eliquis, beta blocker Holding statin d/t transaminitis  Holding ACE/ARB d/t soft BP    Advanced care planning  Discussed code status - pt and family wish for full code I explained that I do not think she would be likely to recover if she were to need intubation/ventilation but she states she would want to try this and is okay with life support if needed in the even of respiratory arrest or severe  distress.  I explained that I do not think she would be likely to survive chest compressions.  Given readmission and high risk decompensation, will ask palliative care to assess    Transaminitis  ALT 214 on admission, 4-5 days ago was 60-80. Values normal 1 year prior.  Question shock liver d/t hypoperfusion Monitor CMP --> improving If not improving, broaden workup   Paroxysmal Afib Metoprolol at lower dose (50 mg --> 25 mg) given soft BP Continue home amiodarone (400 mg bid for now, then 07/04/23 can start 200 mg bid if she is still in hospital, see cardiology note 01/22)  Continue home Eliquis  Continue telemetry    GERD Continue home PPI   Anxiety/Depression Continue home Zoloft Very anxious on BiPap --> ativan IV low dose prn          DVT prophylaxis: Eliquis IV fluids: no continuous IV fluids  Nutrition: low salt cardiac diet  Central lines / invasive devices: none  Code Status: FULL CODE  ACP documentation reviewed: none on file in [facility]  TOC needs: TBD Barriers to dispo / significant pending items: resp fail             Subjective / Brief ROS:  Patient reports breathing is better today -  Denies CP Pain controlled.  Denies new weakness.  Tolerating diet.  Reports no concerns w/ urination/defecation.   Family Communication: family at bedside on rounds     Objective Findings:  Vitals:   06/30/23 0645 06/30/23 0700 06/30/23 0800 06/30/23 1200  BP:  (!) 149/95 (!) 162/64 (!) 154/72  Pulse: 67 71 63 77  Resp: (!) 37 (!) 23 (!) 31 (!) 30  Temp:   97.6 F (36.4 C) 97.9 F (36.6 C)  TempSrc:   Axillary Oral  SpO2: 100% 100% 100% 97%  Weight:      Height:        Intake/Output Summary (Last 24 hours) at 06/30/2023 1522 Last data filed at 06/30/2023 0900 Gross per 24 hour  Intake 204.76 ml  Output --  Net 204.76 ml   Filed Weights   06/29/23 0742  Weight: 50 kg    Examination:  Physical Exam Constitutional:      Appearance: She is not toxic-appearing.  Cardiovascular:     Rate and Rhythm: Normal rate and regular rhythm.  Pulmonary:     Breath sounds: No wheezing or rales.     Comments: examined on nasal cannula, not able to speak full sentences but does appear more calm compared to yesterday when  I saw her on nasal cannula  Abdominal:     Palpations: Abdomen is soft.  Musculoskeletal:     Right lower leg: No edema.     Left lower leg: No edema.  Skin:    General: Skin is warm and dry.  Neurological:     Mental Status: She is alert and oriented to person, place, and time. Mental status is at baseline.  Psychiatric:        Mood and Affect: Mood normal.        Behavior: Behavior normal.          Scheduled Medications:   amiodarone  400 mg Oral BID   Followed by   Melene Muller ON 07/04/2023] amiodarone  200 mg Oral BID   Followed by   Melene Muller ON 07/11/2023] amiodarone  200 mg Oral Daily   apixaban  5 mg Oral BID   budesonide (PULMICORT) nebulizer solution  0.5 mg Nebulization Q12H  metoprolol tartrate  10 mg Intravenous Q8H   multivitamin  1 tablet Oral Daily   pantoprazole  40 mg Oral Daily   predniSONE  40 mg Oral Q breakfast   sertraline  100 mg Oral Daily   umeclidinium-vilanterol  1 puff Inhalation Daily    Continuous Infusions:  ceFEPime (MAXIPIME) IV Stopped (06/30/23 0900)   vancomycin 750 mg (06/30/23 1456)    PRN Medications:  albuterol, LORazepam, sodium chloride flush  Antimicrobials from admission:  Anti-infectives (From admission, onward)    Start     Dose/Rate Route Frequency Ordered Stop   06/30/23 1100  vancomycin (VANCOREADY) IVPB 750 mg/150 mL  Status:  Discontinued        750 mg 150 mL/hr over 60 Minutes Intravenous Every 24 hours 06/29/23 1745 06/30/23 0748   06/30/23 1100  vancomycin (VANCOCIN) IVPB 750 mg/150 ml premix        750 mg 150 mL/hr over 60 Minutes Intravenous Every 24 hours 06/30/23 0747     06/29/23 2200  ceFEPIme (MAXIPIME) 2 g in sodium chloride 0.9 % 100 mL IVPB        2 g 200 mL/hr over 30 Minutes Intravenous Every 12 hours 06/29/23 1745     06/29/23 0900  vancomycin (VANCOCIN) IVPB 1000 mg/200 mL premix        1,000 mg 200 mL/hr over 60 Minutes Intravenous  Once 06/29/23 0846 06/29/23 1232   06/29/23 0900  ceFEPIme  (MAXIPIME) 2 g in sodium chloride 0.9 % 100 mL IVPB        2 g 200 mL/hr over 30 Minutes Intravenous  Once 06/29/23 0846 06/29/23 1020   06/29/23 0900  azithromycin (ZITHROMAX) 500 mg in sodium chloride 0.9 % 250 mL IVPB        500 mg 250 mL/hr over 60 Minutes Intravenous  Once 06/29/23 0846 06/29/23 1051           Data Reviewed:  I have personally reviewed the following...  CBC: Recent Labs  Lab 06/24/23 0534 06/25/23 0436 06/26/23 0900 06/27/23 0414 06/29/23 0748 06/30/23 0631  WBC 18.5* 21.0* 25.4* 19.8* 21.8* 18.5*  NEUTROABS 14.0*  --   --   --  17.7*  --   HGB 12.5 11.4* 12.6 12.0 14.0 12.5  HCT 39.9 34.2* 40.2 36.9 46.0 39.5  MCV 108.7* 102.7* 109.5* 103.7* 114.1* 107.3*  PLT 444* 385 478* 378 429* 329   Basic Metabolic Panel: Recent Labs  Lab 06/25/23 0435 06/25/23 0436 06/26/23 0243 06/27/23 0414 06/28/23 0422 06/29/23 0748 06/30/23 0631  NA  --    < > 137 139 141 140 145  K  --    < > 4.0 3.6 3.8 4.3 3.9  CL  --    < > 96* 100 105 103 108  CO2  --    < > 26 26 27 23 26   GLUCOSE  --    < > 176* 225* 156* 188* 119*  BUN  --    < > 33* 40* 29* 32* 33*  CREATININE  --    < > 1.05* 1.08* 0.77 0.91 0.82  CALCIUM  --    < > 8.2* 7.9* 8.3* 8.1* 7.8*  MG 2.2  --   --   --   --   --   --    < > = values in this interval not displayed.   GFR: Estimated Creatinine Clearance: 46.1 mL/min (by C-G formula based on SCr of 0.82 mg/dL). Liver Function Tests:  Recent Labs  Lab 06/24/23 0534 06/25/23 0436 06/29/23 0748 06/30/23 0631  AST 97* 42* 60* 38  ALT 86* 61* 214* 152*  ALKPHOS 68 63 74 60  BILITOT 0.8 1.0 1.2 1.0  PROT 6.4* 6.0* 6.7 5.7*  ALBUMIN 3.4* 3.4* 3.4* 3.1*   No results for input(s): "LIPASE", "AMYLASE" in the last 168 hours. No results for input(s): "AMMONIA" in the last 168 hours. Coagulation Profile: Recent Labs  Lab 06/24/23 0534  INR 1.4*   Cardiac Enzymes: No results for input(s): "CKTOTAL", "CKMB", "CKMBINDEX", "TROPONINI" in  the last 168 hours. BNP (last 3 results) No results for input(s): "PROBNP" in the last 8760 hours. HbA1C: No results for input(s): "HGBA1C" in the last 72 hours. CBG: Recent Labs  Lab 06/27/23 1138 06/27/23 1658 06/27/23 2122 06/28/23 0810 06/28/23 1110  GLUCAP 142* 165* 167* 128* 137*   Lipid Profile: No results for input(s): "CHOL", "HDL", "LDLCALC", "TRIG", "CHOLHDL", "LDLDIRECT" in the last 72 hours. Thyroid Function Tests: No results for input(s): "TSH", "T4TOTAL", "FREET4", "T3FREE", "THYROIDAB" in the last 72 hours. Anemia Panel: No results for input(s): "VITAMINB12", "FOLATE", "FERRITIN", "TIBC", "IRON", "RETICCTPCT" in the last 72 hours. Most Recent Urinalysis On File:     Component Value Date/Time   COLORURINE YELLOW (A) 06/29/2023 1448   APPEARANCEUR CLOUDY (A) 06/29/2023 1448   LABSPEC 1.029 06/29/2023 1448   PHURINE 5.0 06/29/2023 1448   GLUCOSEU NEGATIVE 06/29/2023 1448   HGBUR NEGATIVE 06/29/2023 1448   BILIRUBINUR NEGATIVE 06/29/2023 1448   KETONESUR NEGATIVE 06/29/2023 1448   PROTEINUR >=300 (A) 06/29/2023 1448   NITRITE NEGATIVE 06/29/2023 1448   LEUKOCYTESUR NEGATIVE 06/29/2023 1448   Sepsis Labs: @LABRCNTIP (procalcitonin:4,lacticidven:4) Microbiology: Recent Results (from the past 240 hours)  Blood culture (routine x 2)     Status: None   Collection Time: 06/24/23  5:34 AM   Specimen: BLOOD  Result Value Ref Range Status   Specimen Description BLOOD BLOOD RIGHT ARM  Final   Special Requests   Final    BOTTLES DRAWN AEROBIC AND ANAEROBIC Blood Culture results may not be optimal due to an inadequate volume of blood received in culture bottles   Culture   Final    NO GROWTH 5 DAYS Performed at [facility], [address] [place], [place] [address]    Report Status 06/29/2023 FINAL  Final  Blood culture (routine x 2)     Status: None   Collection Time: 06/24/23  5:43 AM   Specimen: BLOOD  Result Value Ref Range Status   Specimen  Description BLOOD BLOOD RIGHT FOREARM  Final   Special Requests   Final    BOTTLES DRAWN AEROBIC ONLY Blood Culture results may not be optimal due to an inadequate volume of blood received in culture bottles   Culture   Final    NO GROWTH 5 DAYS Performed at [facility], [address] [place], [place] [address]    Report Status 06/29/2023 FINAL  Final  Resp panel by RT-PCR (RSV, Flu A&B, Covid) Anterior Nasal Swab     Status: None   Collection Time: 06/24/23  5:43 AM   Specimen: Anterior Nasal Swab  Result Value Ref Range Status   SARS Coronavirus 2 by RT PCR NEGATIVE NEGATIVE Final    Comment: (NOTE) SARS-CoV-2 target nucleic acids are NOT DETECTED.  The SARS-CoV-2 RNA is generally detectable in upper respiratory specimens during the acute phase of infection. The lowest concentration of SARS-CoV-2 viral copies this assay can detect is 138 copies/mL.  A negative result does not preclude SARS-Cov-2 infection and should not be used as the sole basis for treatment or other patient management decisions. A negative result may occur with  improper specimen collection/handling, submission of specimen other than nasopharyngeal swab, presence of viral mutation(s) within the areas targeted by this assay, and inadequate number of viral copies(<138 copies/mL). A negative result must be combined with clinical observations, patient history, and epidemiological information. The expected result is Negative.  Fact Sheet for Patients:  BloggerCourse.com  Fact Sheet for Healthcare Providers:  SeriousBroker.it  This test is no t yet approved or cleared by the [place] FDA and  has been authorized for detection and/or diagnosis of SARS-CoV-2 by FDA under an Emergency Use Authorization (EUA). This EUA will remain  in effect (meaning this test can be used) for the duration of the COVID-19 declaration under Section 564(b)(1) of the Act,  21 U.S.C.section 360bbb-3(b)(1), unless the authorization is terminated  or revoked sooner.       Influenza A by PCR NEGATIVE NEGATIVE Final   Influenza B by PCR NEGATIVE NEGATIVE Final    Comment: (NOTE) The Xpert Xpress SARS-CoV-2/FLU/RSV plus assay is intended as an aid in the diagnosis of influenza from Nasopharyngeal swab specimens and should not be used as a sole basis for treatment. Nasal washings and aspirates are unacceptable for Xpert Xpress SARS-CoV-2/FLU/RSV testing.  Fact Sheet for Patients: BloggerCourse.com  Fact Sheet for Healthcare Providers: SeriousBroker.it  This test is not yet approved or cleared by the [place] FDA and has been authorized for detection and/or diagnosis of SARS-CoV-2 by FDA under an Emergency Use Authorization (EUA). This EUA will remain in effect (meaning this test can be used) for the duration of the COVID-19 declaration under Section 564(b)(1) of the Act, 21 U.S.C. section 360bbb-3(b)(1), unless the authorization is terminated or revoked.     Resp Syncytial Virus by PCR NEGATIVE NEGATIVE Final    Comment: (NOTE) Fact Sheet for Patients: BloggerCourse.com  Fact Sheet for Healthcare Providers: SeriousBroker.it  This test is not yet approved or cleared by the [place] FDA and has been authorized for detection and/or diagnosis of SARS-CoV-2 by FDA under an Emergency Use Authorization (EUA). This EUA will remain in effect (meaning this test can be used) for the duration of the COVID-19 declaration under Section 564(b)(1) of the Act, 21 U.S.C. section 360bbb-3(b)(1), unless the authorization is terminated or revoked.  Performed at [facility], [address] [place], [place] [address]   MRSA Next Gen by PCR, Nasal     Status: None   Collection Time: 06/25/23  8:34 AM   Specimen: Nasal Mucosa; Nasal Swab  Result  Value Ref Range Status   MRSA by PCR Next Gen NOT DETECTED NOT DETECTED Final    Comment: (NOTE) The GeneXpert MRSA Assay (FDA approved for NASAL specimens only), is one component of a comprehensive MRSA colonization surveillance program. It is not intended to diagnose MRSA infection nor to guide or monitor treatment for MRSA infections. Test performance is not FDA approved in patients less than 41 years old. Performed at [facility], [address] [place], [place] [address]   Respiratory (~20 pathogens) panel by PCR     Status: None   Collection Time: 06/25/23  9:24 AM   Specimen: Nasopharyngeal Swab; Respiratory  Result Value Ref Range Status   Adenovirus NOT DETECTED NOT DETECTED Final   Coronavirus 229E NOT DETECTED NOT DETECTED Final    Comment: (NOTE) The Coronavirus on  the Respiratory Panel, DOES NOT test for the novel  Coronavirus (2019 nCoV)    Coronavirus HKU1 NOT DETECTED NOT DETECTED Final   Coronavirus NL63 NOT DETECTED NOT DETECTED Final   Coronavirus OC43 NOT DETECTED NOT DETECTED Final   Metapneumovirus NOT DETECTED NOT DETECTED Final   Rhinovirus / Enterovirus NOT DETECTED NOT DETECTED Final   Influenza A NOT DETECTED NOT DETECTED Final   Influenza B NOT DETECTED NOT DETECTED Final   Parainfluenza Virus 1 NOT DETECTED NOT DETECTED Final   Parainfluenza Virus 2 NOT DETECTED NOT DETECTED Final   Parainfluenza Virus 3 NOT DETECTED NOT DETECTED Final   Parainfluenza Virus 4 NOT DETECTED NOT DETECTED Final   Respiratory Syncytial Virus NOT DETECTED NOT DETECTED Final   Bordetella pertussis NOT DETECTED NOT DETECTED Final   Bordetella Parapertussis NOT DETECTED NOT DETECTED Final   Chlamydophila pneumoniae NOT DETECTED NOT DETECTED Final   Mycoplasma pneumoniae NOT DETECTED NOT DETECTED Final    Comment: Performed at [facility], [address], [place], [place] [address]  Blood Culture (routine x 2)     Status: None (Preliminary result)    Collection Time: 06/29/23  7:48 AM   Specimen: BLOOD  Result Value Ref Range Status   Specimen Description BLOOD BLOOD RIGHT FOREARM  Final   Special Requests   Final    BOTTLES DRAWN AEROBIC AND ANAEROBIC Blood Culture results may not be optimal due to an inadequate volume of blood received in culture bottles   Culture   Final    NO GROWTH < 24 HOURS Performed at [facility], [address], [place], [place] [address]    Report Status PENDING  Incomplete  Blood Culture (routine x 2)     Status: None (Preliminary result)   Collection Time: 06/29/23  7:48 AM   Specimen: BLOOD  Result Value Ref Range Status   Specimen Description BLOOD RIGHT ANTECUBITAL  Final   Special Requests   Final    BOTTLES DRAWN AEROBIC AND ANAEROBIC Blood Culture results may not be optimal due to an inadequate volume of blood received in culture bottles   Culture   Final    NO GROWTH < 24 HOURS Performed at [facility], [address], [place], [place] [address]    Report Status PENDING  Incomplete      Radiology Studies last 3 days: DG Chest Port 1 View Result Date: 06/29/2023 CLINICAL DATA:  Questionable sepsis - evaluate for abnormality EXAM: PORTABLE CHEST 1 VIEW COMPARISON:  CT chest 06/24/2023, chest x-ray 06/26/2023 FINDINGS: The heart and mediastinal contours are within normal limits. Atherosclerotic plaque. Slight interval worsening of patchy airspace opacities most prominent along the left upper lobe. Question interstitial markings with no overt pulmonary edema. Flattening of the hemidiaphragms with blunting of the costophrenic angles and likely trace pleural effusions. No pneumothorax. No acute osseous abnormality. IMPRESSION: 1. Slight interval worsening of patchy airspace opacities most prominent along the left upper lobe. Findings suggest infection. Followup PA and lateral chest X-ray is recommended in 3-4 weeks following therapy to ensure resolution. 2. Bilateral trace pleural  effusions. 3. Aortic Atherosclerosis (ICD10-I70.0) and Emphysema (ICD10-J43.9). Electronically Signed   By: Tish Frederickson M.D.   On: 06/29/2023 08:05   ECHOCARDIOGRAM COMPLETE Result Date: 06/27/2023    ECHOCARDIOGRAM REPORT   Patient Name:   DIANAH PRUETT Date of Exam: 06/26/2023 Medical Rec #:  [id number]   Height:       62.0 in Accession #:  [id number]  Weight:       111.1 lb Date of Birth:  Oct 27, 1945   BSA:          1.489 m Patient Age:    77 years    BP:           146/88 mmHg Patient Gender: F           HR:           76 bpm. Exam Location:  [facility] Procedure: 2D Echo, Cardiac Doppler and Color Doppler Indications:     Elevated Troponin  History:         Patient has prior history of Echocardiogram examinations, most                  recent 01/26/2023. COPD, Signs/Symptoms:Dyspnea; Risk                  Factors:Hypertension, Dyslipidemia and Former Smoker.                  Emphysema.  Sonographer:     Daphine Deutscher RDCS Referring Phys:  [id number] Francee Nodal FURTH Diagnosing Phys: Cristal Deer End MD IMPRESSIONS  1. Left ventricular ejection fraction, by estimation, is 55 to 60%. The left ventricle has normal function. The left ventricle has no regional wall motion abnormalities. Left ventricular diastolic parameters are consistent with Grade II diastolic dysfunction (pseudonormalization).  2. Right ventricular systolic function is normal. The right ventricular size is normal. There is moderately elevated pulmonary artery systolic pressure.  3. The mitral valve is degenerative. Moderate to severe mitral valve regurgitation. No evidence of mitral stenosis.  4. Tricuspid valve regurgitation is mild to moderate.  5. The aortic valve is tricuspid. There is mild thickening of the aortic valve. Aortic valve regurgitation is trivial. Aortic valve sclerosis/calcification is present, without any evidence of aortic stenosis.  6. The inferior vena cava is normal in size with <50% respiratory variability, suggesting  right atrial pressure of 8 mmHg. FINDINGS  Left Ventricle: Left ventricular ejection fraction, by estimation, is 55 to 60%. The left ventricle has normal function. The left ventricle has no regional wall motion abnormalities. The left ventricular internal cavity size was normal in size. There is  no left ventricular hypertrophy. Left ventricular diastolic parameters are consistent with Grade II diastolic dysfunction (pseudonormalization). Right Ventricle: The right ventricular size is normal. No increase in right ventricular wall thickness. Right ventricular systolic function is normal. There is moderately elevated pulmonary artery systolic pressure. The tricuspid regurgitant velocity is 3.36 m/s, and with an assumed right atrial pressure of 8 mmHg, the estimated right ventricular systolic pressure is 53.2 mmHg. Left Atrium: Left atrial size was normal in size. Right Atrium: Right atrial size was normal in size. Pericardium: There is no evidence of pericardial effusion. Mitral Valve: The mitral valve is degenerative in appearance. There is moderate thickening of the mitral valve leaflet(s). Moderate to severe mitral valve regurgitation. No evidence of mitral valve stenosis. Tricuspid Valve: The tricuspid valve is normal in structure. Tricuspid valve regurgitation is mild to moderate. Aortic Valve: The aortic valve is tricuspid. There is mild thickening of the aortic valve. There is mild to moderate aortic valve annular calcification. Aortic valve regurgitation is trivial. Aortic valve sclerosis/calcification is present, without any evidence of aortic stenosis. Aortic valve mean gradient measures 5.6 mmHg. Aortic valve peak gradient measures 10.3 mmHg. Aortic valve area, by VTI measures 0.78 cm. Pulmonic Valve: The pulmonic valve was grossly normal. Pulmonic valve regurgitation is  trivial. No evidence of pulmonic stenosis. Aorta: The aortic root is normal in size and structure. Pulmonary Artery: The pulmonary artery  is not well seen. Venous: The inferior vena cava is normal in size with less than 50% respiratory variability, suggesting right atrial pressure of 8 mmHg. IAS/Shunts: No atrial level shunt detected by color flow Doppler.  LEFT VENTRICLE PLAX 2D LVIDd:         4.60 cm   Diastology LVIDs:         3.00 cm   LV e' medial:    7.58 cm/s LV PW:         0.50 cm   LV E/e' medial:  14.1 LV IVS:        0.50 cm   LV e' lateral:   7.51 cm/s LVOT diam:     1.50 cm   LV E/e' lateral: 14.3 LV SV:         24 LV SV Index:   16 LVOT Area:     1.77 cm  RIGHT VENTRICLE             IVC RV Basal diam:  3.20 cm     IVC diam: 1.70 cm RV S prime:     12.67 cm/s TAPSE (M-mode): 3.0 cm LEFT ATRIUM           Index        RIGHT ATRIUM          Index LA diam:      4.10 cm 2.75 cm/m   RA Area:     9.39 cm LA Vol (A2C): 44.6 ml 29.95 ml/m  RA Volume:   19.10 ml 12.83 ml/m LA Vol (A4C): 28.5 ml 19.14 ml/m  AORTIC VALVE AV Area (Vmax):    0.74 cm AV Area (Vmean):   0.71 cm AV Area (VTI):     0.78 cm AV Vmax:           160.47 cm/s AV Vmean:          113.058 cm/s AV VTI:            0.303 m AV Peak Grad:      10.3 mmHg AV Mean Grad:      5.6 mmHg LVOT Vmax:         67.10 cm/s LVOT Vmean:        45.267 cm/s LVOT VTI:          0.134 m LVOT/AV VTI ratio: 0.44  AORTA Ao Root diam: 2.40 cm MITRAL VALVE                TRICUSPID VALVE MV Area (PHT): 4.70 cm     TR Peak grad:   45.2 mmHg MV Decel Time: 162 msec     TR Vmax:        336.00 cm/s MR Peak grad: 85.4 mmHg MR Mean grad: 65.5 mmHg     SHUNTS MR Vmax:      462.00 cm/s   Systemic VTI:  0.13 m MR Vmean:     395.5 cm/s    Systemic Diam: 1.50 cm MV E velocity: 107.00 cm/s MV A velocity: 70.60 cm/s MV E/A ratio:  1.52 Cristal De[place] End MD Electronically signed by Yvonne Kendall MD Signature Date/Time: 06/27/2023/7:23:05 AM    Final        Time spent: 50 min    Sunnie Nielsen, DO [facility] 06/30/2023, 3:22 PM    Dictation software may have been used to generate the above  note. Typos may occur and escape review in typed/dictated notes. Please contact Dr Lyn Hollingshead directly for clarity if needed.  Staff may message me via secure chat in Epic  but this may not receive an immediate response,  please page me for urgent matters!  If 7PM-7AM, please contact night coverage [email or web address]

## 2023-06-30 NOTE — ED Notes (Signed)
Patient remains tachypnic after sitting on side of the bed with PT. O2 sat maintains at 95% on 4L via [place]. Patient states she does not feel like she needs bipap again at this time. Husband at bedside.

## 2023-06-30 NOTE — Evaluation (Addendum)
Physical Therapy Evaluation Patient Details Name: Taylor Chambers MRN: [id number] DOB: 1945-11-25 Today's Date: 06/30/2023  History of Present Illness  Pt is a 78 y.o. female presenting to hospital 06/29/23 for SOB (recently discharged day prior after presenting with hypoxia, CHF, COPD exacerbation, PNA, and sepsis).  Pt admitted with acute on chronic respiratory failure with hypoxia and hypercapnia, COPD exacerbation, question HFpEF exacerbation, elevated troponin (suspect d/t demand/supply mismatch given respiratory distress), and paroxysmal a-fib.  PMH includes COPD on 2-3L home O2, OSA with CPAP, CAD, a-fib, HFpEF, mitral insufficiency, HLD, htn, tobacco abuse.  Clinical Impression  PT/OT co-evaluation performed.  Prior to this hospital admission, pt was home for a short period of time (ambulating short household distances with RW with increased time) after recent hospital discharge; lives with her husband.  Currently pt is max assist x2 with bed mobility and min assist x1-2 for sitting balance (briefly CGA but limited d/t increased WOB and RR).  Pt noted to be belly breathing (and tends to breathe in/out of mouth) and requiring vc's during session for pursed lip breathing.  Increased respiratory rate and work of breathing noted with activity; SOB and increased work of breathing noted with talking (while at rest).  Pt reporting sitting on EOB felt good but pt fatigued quickly with activity.  RR increased up to 44 with limited mobility (SpO2 sats 88-95% sitting EOB on 3 L O2 via nasal cannula) but improved to 21 end of session resting in bed (SpO2 sats >90% on 3 L via nasal cannula).  Nurse updated on pt's status and vitals.  Pt would currently benefit from skilled PT to address noted impairments and functional limitations (see below for any additional details).  Upon hospital discharge, pt would benefit from ongoing therapy.    If plan is discharge home, recommend the following: Two people to help with  walking and/or transfers;A lot of help with bathing/dressing/bathroom;Assistance with cooking/housework;Assist for transportation;Help with stairs or ramp for entrance   Can travel by private vehicle   No    Equipment Recommendations Other (comment) (TBD at next venue of care)  Recommendations for Other Services       Functional Status Assessment Patient has had a recent decline in their functional status and demonstrates the ability to make significant improvements in function in a reasonable and predictable amount of time.     Precautions / Restrictions Precautions Precautions: Fall Restrictions Weight Bearing Restrictions Per Provider Order: No      Mobility  Bed Mobility Overal bed mobility: Needs Assistance Bed Mobility: Supine to Sit, Sit to Supine     Supine to sit: Max assist, +2 for physical assistance Sit to supine: Max assist, +2 for physical assistance   General bed mobility comments: assist for trunk and B LE's    Transfers                   General transfer comment: Deferred d/t respiratory concerns    Ambulation/Gait                  Stairs            Wheelchair Mobility     Tilt Bed    Modified Rankin (Stroke Patients Only)       Balance Overall balance assessment: Needs assistance Sitting-balance support: Bilateral upper extremity supported, Feet supported Sitting balance-Leahy Scale: Poor Sitting balance - Comments: briefly CGA (limited d/t increased work of breathing); otherwise min assist x1-2 (for approximately 5-7 minutes)  Pertinent Vitals/Pain Pain Assessment Pain Assessment: No/denies pain Respiratory rate 21-44 during session (21 end of session at rest); HR 89 bpm, SpO2 sats 93-94% on a little over 3 L O2, and BP 180/88 end of session at rest (nurse notified).    Home Living Family/patient expects to be discharged to:: Private residence Living  Arrangements: Spouse/significant other Available Help at Discharge: Family;Available 24 hours/day Type of Home: House Home Access: Stairs to enter   [employer] of Steps: 18 steps, has a Theatre manager.   Home Layout: Two level;Able to live on main level with bedroom/bathroom Home Equipment: Shower seat;Grab bars - tub/shower;Hand held shower head;Rolling Walker (2 wheels);BSC/3in1      Prior Function Prior Level of Function : Independent/Modified Independent             Mobility Comments: Prior to recent hospitalizations, pt was walking independently but has had 2 falls in last 6 months.  Pt was walking short household distances with RW after recent discharge home. ADLs Comments: Independent with ADLs.     Extremity/Trunk Assessment   Upper Extremity Assessment Upper Extremity Assessment: Generalized weakness    Lower Extremity Assessment Lower Extremity Assessment: Generalized weakness    Cervical / Trunk Assessment Cervical / Trunk Assessment: Other exceptions Cervical / Trunk Exceptions: Forward head/shoulders  Communication   Communication Communication: No apparent difficulties Cueing Techniques: Verbal cues  Cognition Arousal: Alert Behavior During Therapy: WFL for tasks assessed/performed, Anxious Overall Cognitive Status: Within Functional Limits for tasks assessed                                          General Comments   Pt agreeable to PT session.  Pt's son present during session.    Exercises     Assessment/Plan    PT Assessment Patient needs continued PT services  PT Problem List Decreased strength;Decreased activity tolerance;Decreased balance;Decreased mobility;Cardiopulmonary status limiting activity       PT Treatment Interventions DME instruction;Gait training;Stair training;Functional mobility training;Therapeutic activities;Therapeutic exercise;Balance training;Patient/family education    PT Goals (Current goals  can be found in the Care Plan section)  Acute Rehab PT Goals Patient Stated Goal: to go home soon PT Goal Formulation: With patient Time For Goal Achievement: 07/14/23 Potential to Achieve Goals: Fair    Frequency Min 1X/week     Co-evaluation PT/OT/SLP Co-Evaluation/Treatment: Yes Reason for Co-Treatment: For patient/therapist safety;To address functional/ADL transfers PT goals addressed during session: Mobility/safety with mobility;Balance OT goals addressed during session: ADL's and self-care       AM-PAC PT "6 Clicks" Mobility  Outcome Measure Help needed turning from your back to your side while in a flat bed without using bedrails?: A Lot Help needed moving from lying on your back to sitting on the side of a flat bed without using bedrails?: Total Help needed moving to and from a bed to a chair (including a wheelchair)?: Total Help needed standing up from a chair using your arms (e.g., wheelchair or bedside chair)?: Total Help needed to walk in hospital room?: Total Help needed climbing 3-5 steps with a railing? : Total 6 Click Score: 7    End of Session Equipment Utilized During Treatment: Oxygen (a little over 3 L via nasal cannula) Activity Tolerance: Other (comment) (limited d/t increased RR and work of breathing) Patient left: in bed;with call bell/phone within reach;with family/visitor present;Other (comment) (pt in ED stretcher bed;  B railings up; bed in lowest position) Nurse Communication: Mobility status;Precautions;Other (comment) (pt's elevated BP, SpO2 sats during session; increased RR/work of breathing with activity) PT Visit Diagnosis: Other abnormalities of gait and mobility (R26.89);Muscle weakness (generalized) (M62.81);History of falling (Z91.81)    Time: 2536-6440 PT Time Calculation (min) (ACUTE ONLY): 17 min   Charges:   PT Evaluation $PT Eval Low Complexity: 1 Low   PT General Charges $$ ACUTE PT VISIT: 1 Visit        Hendricks Limes,  PT 06/30/23, 3:37 PM

## 2023-06-30 NOTE — Procedures (Signed)
PROCEDURE SUMMARY:  Successful US guided right thoracentesis. Yielded 300 ml of clear yellow fluid. Pt tolerated procedure well. No immediate complications.  Specimen sent for labs. CXR ordered; no post-procedure pneumothorax identified.   EBL < 2 mL  Mickie Kay, NP 06/30/2023 4:52 PM

## 2023-06-30 NOTE — Consult Note (Signed)
PULMONOLOGY         Date: 06/30/2023,   MRN# [id number] Taylor Chambers 1946-03-26     AdmissionWeight: 50 kg                 CurrentWeight: 50 kg   CHIEF COMPLAINT:   Recurrent hospitalization with hypoxemia and advanced COPD   HISTORY OF PRESENT ILLNESS   This is a pleasant 78 yo F with a history of COPD chronic hypoxemia, CAD, atrial fibrillation, HFpEF, mitral insufficiency, hyperlipidemia, hypertension, tobacco abuse she was admitted on 06/24/23 with AFRVR and AECOPD with hypoxemia requiring critical care unit stay on BIPAP. She was also empirically treated for CAP due to infiltrate on CT chest. She did improve post treatment however after coming home husband reports severe dyspnea with notable tripoding and labored breathing with immediate return to ED.  On admission she has leukocytosis on CBC, elevated cardiac biomarkers with BNP >1600   PAST MEDICAL HISTORY   Past Medical History:  Diagnosis Date   Allergies    Anxiety    Aortic atherosclerosis (HCC)    Bipolar affective disorder (HCC)    COPD (chronic obstructive pulmonary disease) (HCC)    Coronary artery calcification seen on CT scan    Depression    Diastolic dysfunction 02/24/2020   a.) TTE 02/24/2020: EF >55%; triv PR, mild TR, mod MR; G1DD.   DOE (dyspnea on exertion)    Emphysema lung (HCC)    History of cataract    HLD (hyperlipidemia)    Hypertension    Macular degeneration    Migraines    Osteoporosis    Pneumonia    Tobacco use      SURGICAL HISTORY   Past Surgical History:  Procedure Laterality Date   CATARACT EXTRACTION     COLONOSCOPY N/A 08/30/2021   Procedure: COLONOSCOPY;  Surgeon: Jaynie Collins, DO;  Location: [facility] ENDOSCOPY;  Service: Gastroenterology;  Laterality: N/A;   DILATION AND CURETTAGE OF UTERUS     TEE WITHOUT CARDIOVERSION N/A 04/20/2023   Procedure: TRANSESOPHAGEAL ECHOCARDIOGRAM;  Surgeon: Antonieta Iba, MD;  Location: [facility] ORS;  Service:  Cardiovascular;  Laterality: N/A;   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   Procedure: TOTAL HIP ARTHROPLASTY;  Surgeon: Donato Heinz, MD;  Location: [facility] ORS;  Service: Orthopedics;  Laterality: Right;   WISDOM TOOTH EXTRACTION     WRIST GANGLION EXCISION       FAMILY HISTORY   Family History  Problem Relation Age of Onset   Breast cancer Mother 68   Heart Problems Maternal Uncle    Heart Problems Paternal Aunt    Heart Problems Paternal Uncle      SOCIAL HISTORY   Social History   Tobacco Use   Smoking status: Former    Current packs/day: 0.00    Average packs/day: 0.3 packs/day for 50.0 years (12.5 ttl pk-yrs)    Types: Cigarettes    Start date: 01/10/1972    Quit date: 01/09/2022    Years since quitting: 1.4   Smokeless tobacco: Never  Vaping Use   Vaping status: Never Used  Substance Use Topics   Alcohol use: Never   Drug use: Never     MEDICATIONS    Home Medication:  Current Outpatient Rx   Order #: [id number] Class: Normal   Order #: [id number] Class: Normal   Order #: [id number] Class: Historical Med   Order #: [id number] Class: Historical Med   Order #: [id number] Class: Historical Med   Order #: [id number] Class: Historical  Med   Order #: [id number] Class: Historical Med   Order #: [id number] Class: Historical Med   Order #: [id number] Class: Historical Med   Order #: [id number] Class: Normal   Order #: [id number] Class: Historical Med   Order #: [id number] Class: Historical Med   Order #: [id number] Class: Historical Med   Order #: [id number] Class: Normal   Order #: [id number] Class: Historical Med   Order #: [id number] Class: Historical Med   Order #: [id number] Class: Normal    Current Medication:  Current Facility-Administered Medications:    albuterol (PROVENTIL) (2.5 MG/3ML) 0.083% nebulizer solution 2.5 mg, 2.5 mg, Nebulization, Q2H PRN, Sunnie Nielsen, DO, 2.5 mg at 06/30/23 2831   amiodarone (PACERONE) tablet 400 mg, 400 mg, Oral, BID, 400 mg at 06/30/23 0059  **FOLLOWED BY** [START ON 07/04/2023] amiodarone (PACERONE) tablet 200 mg, 200 mg, Oral, BID **FOLLOWED BY** [START ON 07/11/2023] amiodarone (PACERONE) tablet 200 mg, 200 mg, Oral, Daily, Lyn Hollingshead, Natalie, DO   apixaban Everlene Balls) tablet 5 mg, 5 mg, Oral, BID, Sunnie Nielsen, DO, 5 mg at 06/30/23 0059   budesonide (PULMICORT) nebulizer solution 0.5 mg, 0.5 mg, Nebulization, Q12H, Sunnie Nielsen, DO   ceFEPIme (MAXIPIME) 2 g in sodium chloride 0.9 % 100 mL IVPB, 2 g, Intravenous, Q12H, Effie Shy, RPH, Stopped at 06/30/23 0130   LORazepam (ATIVAN) injection 0.5 mg, 0.5 mg, Intravenous, Q6H PRN, Sunnie Nielsen, DO   metoprolol tartrate (LOPRESSOR) injection 10 mg, 10 mg, Intravenous, Q8H, Sunnie Nielsen, DO, 10 mg at 06/30/23 5176   multivitamin (PROSIGHT) tablet 1 tablet, 1 tablet, Oral, Daily, Prince Solian F, RPH   pantoprazole (PROTONIX) EC tablet 40 mg, 40 mg, Oral, Daily, Alexander, Natalie, DO   predniSONE (DELTASONE) tablet 40 mg, 40 mg, Oral, Q breakfast, Sunnie Nielsen, DO   sertraline (ZOLOFT) tablet 100 mg, 100 mg, Oral, Daily, Lyn Hollingshead, Natalie, DO   sodium chloride flush (NS) 0.9 % injection 3 mL, 3 mL, Intravenous, PRN, Sunnie Nielsen, DO   umeclidinium-vilanterol (ANORO ELLIPTA) 62.5-25 MCG/ACT 1 puff, 1 puff, Inhalation, Daily, Alexander, Natalie, DO   vancomycin (VANCOCIN) IVPB 750 mg/150 ml premix, 750 mg, Intravenous, Q24H, Barrie Folk, [facility]  Current Outpatient Medications:    albuterol (VENTOLIN HFA) 108 (90 Base) MCG/ACT inhaler, Inhale 2 puffs into the lungs every 6 (six) hours as needed for wheezing or shortness of breath., Disp: 8 g, Rfl: 2   apixaban (ELIQUIS) 5 MG TABS tablet, Take 1 tablet (5 mg total) by mouth 2 (two) times daily., Disp: 60 tablet, Rfl: 3   Calcium Carbonate (CALCIUM 500 PO), Take 500 mg by mouth daily at 12 noon., Disp: , Rfl:    Cholecalciferol (VITAMIN D3) 1.25 MG (50000 UT) CAPS, Take 5,000 Units by mouth daily  in the afternoon., Disp: , Rfl:    Dupilumab 300 MG/2ML SOAJ, Inject 300 mg into the skin every 14 (fourteen) days. Saturday, Disp: , Rfl:    EPINEPHrine 0.3 mg/0.3 mL IJ SOAJ injection, Inject 0.3 mg into the muscle as needed for anaphylaxis., Disp: , Rfl:    ibuprofen (ADVIL) 200 MG tablet, Take 400 mg by mouth every 6 (six) hours as needed for headache., Disp: , Rfl:    Ipratropium-Albuterol (COMBIVENT RESPIMAT) 20-100 MCG/ACT AERS respimat, Inhale 1 puff into the lungs 2 (two) times daily., Disp: , Rfl:    ipratropium-albuterol (DUONEB) 0.5-2.5 (3) MG/3ML SOLN, Take 3 mLs by nebulization 2 (two) times daily. Mix with budesonide, Disp: , Rfl:    metoprolol succinate (TOPROL-XL) 50 MG 24 hr tablet, Take 1 tablet (50 mg total)  by mouth 2 (two) times daily. Take with or immediately following a meal., Disp: 90 tablet, Rfl: 3   Multiple Vitamins-Minerals (PRESERVISION AREDS 2+MULTI VIT PO), Take 2 tablets by mouth daily., Disp: , Rfl:    pravastatin (PRAVACHOL) 10 MG tablet, Take 10 mg by mouth daily., Disp: , Rfl:    sulfamethoxazole-trimethoprim (BACTRIM) 400-80 MG tablet, Take 1 tablet by mouth 3 (three) times a week. Monday, Wednesday and Friday, Disp: , Rfl:    amiodarone (PACERONE) 200 MG tablet, Take 2 tabs twice a day to start. On 1/28 start taking 1 tab twice a day. On 2/4 start taking one tab once a day, Disp: 90 tablet, Rfl: 1   OHTUVAYRE 3 MG/2.5ML SUSP, Take 3 mg by nebulization daily., Disp: , Rfl:    omeprazole (PRILOSEC) 40 MG capsule, Take 40 mg by mouth daily as needed (Heartburn)., Disp: , Rfl:    predniSONE (DELTASONE) 10 MG tablet, 4 tabs daily for 3 days then 3 tabs daily for 3 days then 2 tabs daily for 3 days then 1 tab daily for 3 days, Disp: 30 tablet, Rfl: 0    ALLERGIES   Bupropion     REVIEW OF SYSTEMS    Review of Systems:  Gen:  Denies  fever, sweats, chills weigh loss  HEENT: Denies blurred vision, double vision, ear pain, eye pain, hearing loss, nose  bleeds, sore throat Cardiac:  No dizziness, chest pain or heaviness, chest tightness,edema Resp:   reports dyspnea chronically  Gi: Denies swallowing difficulty, stomach pain, nausea or vomiting, diarrhea, constipation, bowel incontinence Gu:  Denies bladder incontinence, burning urine Ext:   Denies Joint pain, stiffness or swelling Skin: Denies  skin rash, easy bruising or bleeding or hives Endoc:  Denies polyuria, polydipsia , polyphagia or weight change Psych:   Denies depression, insomnia or hallucinations   Other:  All other systems negative   VS: BP (!) 162/79   Pulse 67   Temp 97.9 F (36.6 C) (Axillary)   Resp (!) 37   Ht 5\' 2"  (1.575 m)   Wt 50 kg   SpO2 100%   BMI 20.16 kg/m      PHYSICAL EXAM    GENERAL:NAD, no fevers, chills, no weakness no fatigue HEAD: Normocephalic, atraumatic.  EYES: Pupils equal, round, reactive to light. Extraocular muscles intact. No scleral icterus.  MOUTH: Moist mucosal membrane. Dentition intact. No abscess noted.  EAR, NOSE, THROAT: Clear without exudates. No external lesions.  NECK: Supple. No thyromegaly. No nodules. No JVD.  PULMONARY: decreased breath sounds with mild rhonchi worse at bases bilaterally.  CARDIOVASCULAR: S1 and S2. Regular rate and rhythm. No murmurs, rubs, or gallops. No edema. Pedal pulses 2+ bilaterally.  GASTROINTESTINAL: Soft, nontender, nondistended. No masses. Positive bowel sounds. No hepatosplenomegaly.  MUSCULOSKELETAL: No swelling, clubbing, or edema. Range of motion full in all extremities.  NEUROLOGIC: Cranial nerves II through XII are intact. No gross focal neurological deficits. Sensation intact. Reflexes intact.  SKIN: No ulceration, lesions, rashes, or cyanosis. Skin warm and dry. Turgor intact.  PSYCHIATRIC: Mood, affect within normal limits. The patient is awake, alert and oriented x 3. Insight, judgment intact.       IMAGING        ASSESSMENT/PLAN      Acute on chronic hypoxemic  respiratory failure   Baseline 2L/min   - this is due to combination of acute on chronic COPD but mostly CHF ecacerbation due to objective evidence of fluid overload and cardiac dysfunction  Acute decompensated diastolic CHF eith EF >55% -interstitial edema with right moderate pleural effusion   - elevated cardiac biomarkerts   1,489.4 High  1,525.9 High  CM  1,629.4 High  CM 1,169.8 High  CM 94.0 CM 63.0   Comment: Performed at [facility], [address] [place], [place] [address]  She has no ankle edema but does have abominal edema and JVD elevation - will order thoracentesis and diuretics to help compressive atelectasis by draining right pleural space -aldactone and torsemide post thoracentesis   Compressive atelectasis  - metaneb BID with RT -incentive spirometry  -flutter valve     Altered mental status with encephalopathy    - no metabolic derrangements  -blood cultures negative x 24h does not seem septic - possibly due to insomnia and steroid therapy         Thank you for allowing me to participate in the care of this patient.   Patient/Family are satisfied with care plan and all questions have been answered.    Provider disclosure: Patient with at least one acute or chronic illness or injury that poses a threat to life or bodily function and is being managed actively during this encounter.  All of the below services have been performed independently by signing provider:  review of prior documentation from internal and or external health records.  Review of previous and current lab results.  Interview and comprehensive assessment during patient visit today. Review of current and previous chest radiographs/CT scans. Discussion of management and test interpretation with health care team and patient/family.   This document was prepared using Dragon voice recognition software and may include unintentional dictation errors.     Vida Rigger, M.D.  Division  of Pulmonary & Critical Care Medicine

## 2023-06-30 NOTE — Evaluation (Signed)
Occupational Therapy Evaluation Patient Details Name: Taylor Chambers MRN: [id number] DOB: Mar 25, 1946 Today's Date: 06/30/2023   History of Present Illness Pt is a 78 y.o. female presenting to hospital 06/29/23 for SOB (recently discharged day prior after presenting with hypoxia, CHF, COPD exacerbation, PNA, and sepsis).  Pt admitted with acute on chronic respiratory failure with hypoxia and hypercapnia, COPD exacerbation, question HFpEF exacerbation, elevated troponin (suspect d/t demand/supply mismatch given respiratory distress), and paroxysmal a-fib.  PMH includes COPD on 2-3L home O2, OSA with CPAP, CAD, a-fib, HFpEF, mitral insufficiency, HLD, htn, tobacco abuse.   Clinical Impression   Pt is greeted in bed, alert and oriented x4, agreeable to OT evaluation. Co tx completed with PT on this date. Pt was home for approx 6 hrs after discharge yesterday ,reports increased time for household amb and assist for ADL during that time. Pt is fatigued on this date with increased WOB at rest, spo2 >90% in bed on 3 L via [place]. MAX A +2 for supine<>sit with MIN A for static sitting on edge of bed, briefly required CGA. MAX A for LB dressing. Pt is fatigued throughout with increased WOB in all positions however spo2 noted to increase to 95% seated on edge of bed on 3 L via [place]. Pt is educated on energy conservation techniques, positioning, and PLB. Good carry over noted. Pt will benefit from acute OT to address deficits and to facilitate optimal ADL performance. OT will continue to follow.   Of note: RR up to 44 with brief episode of mobility, pt RR when leaving was 21 with spo2 >90 on 3L via [place] BP 180/88 (MAP 111) after tx session, nurse notified of vitals.       If plan is discharge home, recommend the following: A lot of help with walking and/or transfers;A lot of help with bathing/dressing/bathroom    Functional Status Assessment  Patient has had a recent decline in their functional status and demonstrates  the ability to make significant improvements in function in a reasonable and predictable amount of time.  Equipment Recommendations  Other (comment) (defer)    Recommendations for Other Services       Precautions / Restrictions Precautions Precautions: Fall Precaution Comments: watch sp02 Restrictions Weight Bearing Restrictions Per Provider Order: No      Mobility Bed Mobility Overal bed mobility: Needs Assistance Bed Mobility: Supine to Sit, Sit to Supine     Supine to sit: Max assist, +2 for physical assistance Sit to supine: Max assist, +2 for physical assistance        Transfers                   General transfer comment: Deferred d/t respiratory concerns      Balance Overall balance assessment: Needs assistance Sitting-balance support: Bilateral upper extremity supported, Feet supported Sitting balance-Leahy Scale: Poor Sitting balance - Comments: sat on edge of bed for approx 5-7 min with MIN A, brief bout of CGA                                   ADL either performed or assessed with clinical judgement   ADL Overall ADL's : Needs assistance/impaired     Grooming: Wash/dry face;Sitting;Supervision/safety               Lower Body Dressing: Maximal assistance     Toilet Transfer Details (indicate cue type and reason): deferred on this date  Toileting- Clothing Manipulation and Hygiene: Maximal assistance;Bed level               Vision Patient Visual Report: No change from baseline       Perception         Praxis         Pertinent Vitals/Pain Pain Assessment Pain Assessment: No/denies pain     Extremity/Trunk Assessment Upper Extremity Assessment Upper Extremity Assessment: Generalized weakness   Lower Extremity Assessment Lower Extremity Assessment: Generalized weakness   Cervical / Trunk Assessment Cervical / Trunk Assessment: Other exceptions Cervical / Trunk Exceptions: Forward head/shoulders    Communication Communication Communication: No apparent difficulties Cueing Techniques: Verbal cues   Cognition Arousal: Alert Behavior During Therapy: WFL for tasks assessed/performed, Anxious Overall Cognitive Status: Within Functional Limits for tasks assessed                                       General Comments  Increased WOB throughout    Exercises Other Exercises Other Exercises: edu re: role of OT, role of rehab, discharge recommendations, EC techniques   Shoulder Instructions      Home Living Family/patient expects to be discharged to:: Private residence Living Arrangements: Spouse/significant other Available Help at Discharge: Family;Available 24 hours/day Type of Home: House Home Access: Stairs to enter [employer] of Steps: 18 steps, has a Theatre manager.   Home Layout: Two level;Able to live on main level with bedroom/bathroom     Bathroom Shower/Tub: Producer, television/film/video: Standard     Home Equipment: Shower seat;Grab bars - tub/shower;Hand held Programmer, systems (2 wheels);BSC/3in1          Prior Functioning/Environment Prior Level of Function : Independent/Modified Independent             Mobility Comments: Prior to recent hospitalizations, pt was walking independently but has had 2 falls in last 6 months.  Pt was walking short household distances with RW after recent discharge home. ADLs Comments: Prior to hosptializations, pt Independent with ADLs, now requiring some assist        OT Problem List: Decreased strength;Decreased coordination;Decreased activity tolerance;Decreased safety awareness;Impaired balance (sitting and/or standing);Decreased knowledge of use of DME or AE;Cardiopulmonary status limiting activity      OT Treatment/Interventions: Self-care/ADL training;Therapeutic exercise;Therapeutic activities;DME and/or AE instruction;Patient/family education;Energy conservation;Balance training     OT Goals(Current goals can be found in the care plan section) Acute Rehab OT Goals Patient Stated Goal: go home OT Goal Formulation: With patient/family Time For Goal Achievement: 07/14/23 Potential to Achieve Goals: Fair ADL Goals Pt Will Perform Grooming: sitting;with modified independence Pt Will Perform Lower Body Dressing: with modified independence;sitting/lateral leans;sit to/from stand Pt Will Transfer to Toilet: with modified independence;ambulating Pt Will Perform Toileting - Clothing Manipulation and hygiene: with modified independence;sit to/from stand  OT Frequency: Min 1X/week    Co-evaluation PT/OT/SLP Co-Evaluation/Treatment: Yes Reason for Co-Treatment: For patient/therapist safety;To address functional/ADL transfers PT goals addressed during session: Mobility/safety with mobility;Balance OT goals addressed during session: ADL's and self-care      AM-PAC OT "6 Clicks" Daily Activity     Outcome Measure Help from another person eating meals?: None Help from another person taking care of personal grooming?: A Little Help from another person toileting, which includes using toliet, bedpan, or urinal?: A Lot Help from another person bathing (including washing, rinsing, drying)?: A Lot Help from another  person to put on and taking off regular upper body clothing?: A Lot Help from another person to put on and taking off regular lower body clothing?: A Lot 6 Click Score: 15   End of Session Equipment Utilized During Treatment: Oxygen Nurse Communication: Mobility status (vitals)  Activity Tolerance: Patient limited by fatigue Patient left: in bed;with call bell/phone within reach;with family/visitor present  OT Visit Diagnosis: Other abnormalities of gait and mobility (R26.89);Muscle weakness (generalized) (M62.81)                Time: 1610-9604 OT Time Calculation (min): 17 min Charges:  OT General Charges $OT Visit: 1 Visit OT Evaluation $OT Eval Moderate  Complexity: 1 Mod  Oleta Mouse, OTD OTR/L  06/30/23, 4:23 PM

## 2023-06-30 NOTE — Consult Note (Signed)
Consultation Note Date: 06/30/2023   Patient Name: Taylor Chambers  DOB: Oct 18, 1945  MRN: [id number]  Age / Sex: 78 y.o., female  PCP: Mick Sell, MD Referring Physician: Sunnie Nielsen, DO  Reason for Consultation: Establishing goals of care   HPI/Brief Hospital Course: 78 y.o. female  with past medical history of COPD with chronic respiratory failire on 2-3 L [place] at baseline, OSA on CPAP, CAD, atrial fibrillation, HFpEF, HTN and HLD admitted from home on 06/29/2023 with [facility].  Noted recent admission 1/18-1/22 for AECOPD, A. Fib RVR, CHF exaceration and acute on chronic respiratory failure, discharged home  Reportedly developed sudden shortness of breath, EMS called and brought to ED on bipap WBC and BNP elevated, concern for HCAP given recent admission and CXR findings   Palliative medicine was consulted for assisting with goals of care conversations.  Subjective:  Extensive chart review has been completed prior to meeting patient including labs, vital signs, imaging, progress notes, orders, and available advanced directive documents from current and previous encounters.  Initial visit to bedside, Taylor Chambers on bipap, resting in bed-no family present Returned to bedside later in AM, off bipap, Taylor Chambers able to awaken to calling of her name, unable to appropriately answer all orientation questions, son at bedside shares husband to visit later today.  Returned to bedside this afternoon, husband-Jerry and other son at bedside. Taylor Chambers remains off ventilator. Some noted increased WOB, SpO2 stable, heart rate fluctuating. Taylor Chambers able to respond appropriately to simple questions, intermittent confusion remains, unable to participate in goals of care discussion.  Introduced myself as a Publishing rights manager as a member of the palliative care team. Explained palliative medicine is specialized medical care for people living with serious illness. It  focuses on providing relief from the symptoms and stress of a serious illness. The goal is to improve quality of life for both the patient and the family.   Dorene Sorrow shares a brief life review. He and Taylor Chambers have been married for 60 years. They have 2 sons and many grandchildren and great grandchildren. At baseline, Taylor Chambers functions independently and loves spending time with her family.  Dorene Sorrow speaks to his frustration around recurrent admission. He shares he feels she was not stable enough for discharge, he shares Taylor Chambers showed signs of struggle on their way home. Dorene Sorrow shares throughout the day and night he did what he could to try and get her settled but had to EMS because things were not improving.  Dorene Sorrow shares his understanding of current medical condition. Aware Taylor Chambers is being treated for COPD exacerbation, PNA and CHF exacerbation.  Dorene Sorrow shares his main goal is for Taylor Chambers to get proper treatment, improve and be able to return home.  Dorene Sorrow shares he was able to be present when pulmonologist visited earlier today. He feels a good plan has been put in place and appreciates Dr. Mervyn Skeeters visiting as Taylor Chambers follows him as an outpatient.  During our conversations, ultrasound technician to bedside to take Taylor Chambers for thoracentesis due to CTA findings of moderate right pleural effusion. Dorene Sorrow is hopeful this will improve her respiratory status.  All questions/concerns addressed. Emotional support provided to patient/family/support persons. PMT will continue to follow and support patient as needed.  Objective: Primary Diagnoses: Present on Admission:  Acute on chronic respiratory failure with hypoxia and hypercapnia (HCC)   Physical Exam Constitutional:      General: She is not in acute distress.    Appearance: She  is ill-appearing.  Pulmonary:     Effort: Tachypnea and accessory muscle usage present.  Skin:    General: Skin is warm and dry.  Neurological:     Mental Status:  She is alert. She is disoriented.     Motor: Weakness present.     Vital Signs: BP (!) 154/72   Pulse 77   Temp 97.9 F (36.6 C) (Oral)   Resp (!) 30   Ht 5\' 2"  (1.575 m)   Wt 50 kg   SpO2 97%   BMI 20.16 kg/m  Pain Scale: 0-10   Pain Score: 0-No pain  IO: Intake/output summary:  Intake/Output Summary (Last 24 hours) at 06/30/2023 1456 Last data filed at 06/30/2023 0900 Gross per 24 hour  Intake 204.76 ml  Output --  Net 204.76 ml    LBM:   Baseline Weight: Weight: 50 kg Most recent weight: Weight: 50 kg      Assessment and Plan  SUMMARY OF RECOMMENDATIONS   Time for outcomes PMT to continue to follow for ongoing needs and support  Palliative Prophylaxis:   Bowel Regimen, Delirium Protocol and Frequent Pain Assessment   Thank you for this consult and allowing Palliative Medicine to participate in the care of Taylor Chambers. Palliative medicine will continue to follow and assist as needed.   Time Total: 75 minutes  Time spent includes: Detailed review of medical records (labs, imaging, vital signs), medically appropriate exam (mental status, respiratory, cardiac, skin), discussed with treatment team, counseling and educating patient, family and staff, documenting clinical information, medication management and coordination of care.   Signed by: Leeanne Deed, DNP, AGNP-C Palliative Medicine    Please contact Palliative Medicine Team phone at [phone] for questions and concerns.  For individual provider: See Loretha Stapler

## 2023-07-01 DIAGNOSIS — J9622 Acute and chronic respiratory failure with hypercapnia: Secondary | ICD-10-CM | POA: Diagnosis not present

## 2023-07-01 DIAGNOSIS — J189 Pneumonia, unspecified organism: Secondary | ICD-10-CM | POA: Diagnosis not present

## 2023-07-01 DIAGNOSIS — J90 Pleural effusion, not elsewhere classified: Secondary | ICD-10-CM | POA: Diagnosis not present

## 2023-07-01 DIAGNOSIS — Z515 Encounter for palliative care: Secondary | ICD-10-CM | POA: Diagnosis not present

## 2023-07-01 DIAGNOSIS — J9621 Acute and chronic respiratory failure with hypoxia: Secondary | ICD-10-CM | POA: Diagnosis not present

## 2023-07-01 LAB — MRSA NEXT GEN BY PCR, NASAL: MRSA by PCR Next Gen: NOT DETECTED

## 2023-07-01 MED ORDER — SPIRONOLACTONE 25 MG PO TABS
25.0000 mg | ORAL_TABLET | Freq: Every day | ORAL | Status: DC
  Administered 2023-07-01: 25 mg via ORAL
  Filled 2023-07-01: qty 1

## 2023-07-01 MED ORDER — ADULT MULTIVITAMIN W/MINERALS CH
1.0000 | ORAL_TABLET | Freq: Every day | ORAL | Status: DC
  Administered 2023-07-01 – 2023-07-03 (×3): 1 via ORAL
  Filled 2023-07-01 (×3): qty 1

## 2023-07-01 MED ORDER — FUROSEMIDE 10 MG/ML IJ SOLN
40.0000 mg | Freq: Once | INTRAMUSCULAR | Status: AC
  Administered 2023-07-01: 40 mg via INTRAVENOUS
  Filled 2023-07-01: qty 4

## 2023-07-01 MED ORDER — ENSURE ENLIVE PO LIQD
237.0000 mL | Freq: Two times a day (BID) | ORAL | Status: DC
  Administered 2023-07-01 – 2023-07-03 (×5): 237 mL via ORAL

## 2023-07-01 MED ORDER — TORSEMIDE 20 MG PO TABS
20.0000 mg | ORAL_TABLET | Freq: Every day | ORAL | Status: DC
Start: 2023-07-01 — End: 2023-07-02
  Administered 2023-07-01: 20 mg via ORAL
  Filled 2023-07-01: qty 1

## 2023-07-01 NOTE — Plan of Care (Signed)

## 2023-07-01 NOTE — Progress Notes (Signed)
Daily Progress Note   Patient Name: Taylor Chambers       Date: 07/01/2023 DOB: [birth year]  Age: 78 y.o. MRN#: [id number] Attending Physician: Sunnie Nielsen, DO Primary Care Physician: Mick Sell, MD Admit Date: 06/29/2023  Reason for Consultation/Follow-up: Establishing goals of care  HPI/Brief Hospital Review: 78 y.o. female  with past medical history of COPD with chronic respiratory failire on 2-3 L [place] at baseline, OSA on CPAP, CAD, atrial fibrillation, HFpEF, HTN and HLD admitted from home on 06/29/2023 with [facility].   Noted recent admission 1/18-1/22 for AECOPD, A. Fib RVR, CHF exaceration and acute on chronic respiratory failure, discharged home   Reportedly developed sudden shortness of breath, EMS called and brought to ED on bipap WBC and BNP elevated, concern for HCAP given recent admission and CXR findings  Underwent thoracentesis 1/24-300 cc removed    Palliative medicine was consulted for assisting with goals of care conversations.  Subjective: Extensive chart review has been completed prior to meeting patient including labs, vital signs, imaging, progress notes, orders, and available advanced directive documents from current and previous encounters.    Visited with Taylor Chambers at her bedside. She is awake, more alert and oriented compared to visit yesterday. She is able to answer most orientation questions, can recite days of week backwards, has intermittent confusion. Able to engage in GOC discussions. Respiratory status much improved as well. Son at bedside during time of visit.  Taylor Chambers able to share her understanding of current medical condition.  We discussed chronic disease trajectory as it relates to her underlying COPD.  Attempted to elicit goals of care. We  discussed code status and the difference between Full Code versus Do Not Resuscitate, we also discussed intubation in event of decompensated respiratory distress. Encouraged Taylor Chambers to consider DNR/DNI status understanding evidenced based poor outcomes in similar hospitalized patients, as the cause of the arrest is likely associated with chronic/terminal disease rather than a reversible acute cardio-pulmonary event.  During our conversations, Taylor Chambers fluctuates on sharing her ideas and desire for code status. Requested time to discuss with her husband prior to decision making.  Agrees to continuing conversations at a later time when husband present.  Answered and addressed all questions and concerns. PMT to continue to follow for ongoing needs and support.  Thank you for  allowing the Palliative Medicine Team to assist in the care of this patient.  Total time:  35 minutes  Time spent includes: Detailed review of medical records (labs, imaging, vital signs), medically appropriate exam (mental status, respiratory, cardiac, skin), discussed with treatment team, counseling and educating patient, family and staff, documenting clinical information, medication management and coordination of care.  Leeanne Deed, DNP, AGNP-C Palliative Medicine   Please contact Palliative Medicine Team phone at [phone] for questions and concerns.

## 2023-07-01 NOTE — Progress Notes (Signed)
Initial Nutrition Assessment  DOCUMENTATION CODES:   Not applicable  INTERVENTION:   -Ensure Enlive po BID, each supplement provides 350 kcal and 20 grams of protein.  -MVI with minerals daily  NUTRITION DIAGNOSIS:   Increased nutrient needs related to chronic illness (COPD, CHF) as evidenced by estimated needs.  GOAL:   Patient will meet greater than or equal to 90% of their needs  MONITOR:   PO intake, Supplement acceptance  REASON FOR ASSESSMENT:   Consult Assessment of nutrition requirement/status  ASSESSMENT:   Pt with medical history significant of COPD, CAD, atrial fibrillation, HFpEF, mitral insufficiency, hyperlipidemia, hypertension, tobacco abuse. She was admitted recently 06/24/23 and discharged 01/22 following treatment for COPD exacerbation, Afib RVR, HFpEF exacerbation all resulting in acute hypercarbic respiratory failure on chronic hypoxic respiratory failure, requiring BiPap. Pt admitted with shortness of breath.  Pt admitted with acute respiratory failure, COPD, and CHF.  1/24- s/p rt thoracentesis (300 ml removed)  Reviewed I/O's: -180 ml x 24 hours and +25 ml since admission  UOP: 450 ml x 24 hours  Pt unavailable at time of visit. Attempted to speak with pt via call to hospital room phone, however, unable to reach. RD unable to obtain further nutrition-related history or complete nutrition-focused physical exam at this time.    Pt admitted with interstitial edema and rt moderate pleural effusion s/p thoracentesis.   Pt on a 2 gram sodium diet with 1.5 L fluid restriction. No meal completion data available to assess at this time.  No wt loss noted over the past 6 months. Noted with with edema, which may be masking true weight loss as well as fat and muscle depletions.  Palliative care following for goals of care discussions; plan time for outcomes.   Medications reviewed and include lasix, prednisone, and protonix.   Labs reviewed: CBGS: 137  (inpatient orders for glycemic control are none).    Diet Order:   Diet Order             Diet 2 gram sodium Room service appropriate? Yes; Fluid consistency: Thin; Fluid restriction: 1500 mL Fluid  Diet effective now                   EDUCATION NEEDS:   No education needs have been identified at this time  Skin:  Skin Assessment: Reviewed RN Assessment  Last BM:  Unknown  Height:   Ht Readings from Last 1 Encounters:  06/29/23 5\' 2"  (1.575 m)    Weight:   Wt Readings from Last 1 Encounters:  06/29/23 50 kg    Ideal Body Weight:  50 kg  BMI:  Body mass index is 20.16 kg/m.  Estimated Nutritional Needs:   Kcal:  1500-1700  Protein:  75-90 grams  Fluid:  1.5 L    Levada Schilling, RD, LDN, CDCES Registered Dietitian III Certified Diabetes Care and Education Specialist If unable to reach this RD, please use "RD Inpatient" group chat on secure chat between hours of 8am-4 pm daily

## 2023-07-01 NOTE — Progress Notes (Signed)
PULMONOLOGY         Date: 07/01/2023,   MRN# [id number] Taylor Chambers Aug 02, 1945     AdmissionWeight: 50 kg                 CurrentWeight: 50 kg   CHIEF COMPLAINT:   Recurrent hospitalization with hypoxemia and advanced COPD   HISTORY OF PRESENT ILLNESS   This is a pleasant 78 yo F with a history of COPD chronic hypoxemia, CAD, atrial fibrillation, HFpEF, mitral insufficiency, hyperlipidemia, hypertension, tobacco abuse she was admitted on 06/24/23 with AFRVR and AECOPD with hypoxemia requiring critical care unit stay on BIPAP. She was also empirically treated for CAP due to infiltrate on CT chest. She did improve post treatment however after coming home husband reports severe dyspnea with notable tripoding and labored breathing with immediate return to ED.  On admission she has leukocytosis on CBC, elevated cardiac biomarkers with BNP >1600   07/01/23- s/p thoracentesis with 300cc yield, now on 2L/min. Seems improved clinically.  More forgetful then previously noted in clinic, today she said my name and recognized me during interview but after examination she started to introduce herself to me.  She may be developing component of dementia.  She does not appear septic and is in good spirits. Have added additional diuresis with torsemide and aldactone.  She has urinary collection system and is s/p lasix yesterday with good UOP.  Pleural fluid profile is transudate with slightly raised LDH due to diuresis.  This suggests absence of parapneumonic process. She did well with metaneb yesterday.    PAST MEDICAL HISTORY   Past Medical History:  Diagnosis Date   Allergies    Anxiety    Aortic atherosclerosis (HCC)    Bipolar affective disorder (HCC)    COPD (chronic obstructive pulmonary disease) (HCC)    Coronary artery calcification seen on CT scan    Depression    Diastolic dysfunction 02/24/2020   a.) TTE 02/24/2020: EF >55%; triv PR, mild TR, mod MR; G1DD.   DOE (dyspnea on  exertion)    Emphysema lung (HCC)    History of cataract    HLD (hyperlipidemia)    Hypertension    Macular degeneration    Migraines    Osteoporosis    Pneumonia    Tobacco use      SURGICAL HISTORY   Past Surgical History:  Procedure Laterality Date   CATARACT EXTRACTION     COLONOSCOPY N/A 08/30/2021   Procedure: COLONOSCOPY;  Surgeon: Jaynie Collins, DO;  Location: [facility] END[facility]COPY;  Service: Gastroenterology;  Laterality: N/A;   DILATION AND CURETTAGE OF UTERUS     TEE WITHOUT CARDIOVERSION N/A 04/20/2023   Procedure: TRANSESOPHAGEAL ECHOCARDIOGRAM;  Surgeon: Antonieta Iba, MD;  Location: [facility] ORS;  Service: Cardiovascular;  Laterality: N/A;   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   Procedure: TOTAL HIP ARTHROPLASTY;  Surgeon: Donato Heinz, MD;  Location: [facility] ORS;  Service: Orthopedics;  Laterality: Right;   WISDOM TOOTH EXTRACTION     WRIST GANGLION EXCISION       FAMILY HISTORY   Family History  Problem Relation Age of Onset   Breast cancer Mother 29   Heart Problems Maternal Uncle    Heart Problems Paternal Aunt    Heart Problems Paternal Uncle      SOCIAL HISTORY   Social History   Tobacco Use   Smoking status: Former    Current packs/day: 0.00    Average packs/day: 0.3 packs/day for  50.0 years (12.5 ttl pk-yrs)    Types: Cigarettes    Start date: 01/10/1972    Quit date: 01/09/2022    Years since quitting: 1.4   Smokeless tobacco: Never  Vaping Use   Vaping status: Never Used  Substance Use Topics   Alcohol use: Never   Drug use: Never     MEDICATIONS    Home Medication:     Current Medication:  Current Facility-Administered Medications:    albuterol (PROVENTIL) (2.5 MG/3ML) 0.083% nebulizer solution 2.5 mg, 2.5 mg, Nebulization, Q2H PRN, Sunnie Nielsen, DO, 2.5 mg at 06/30/23 1404   amiodarone (PACERONE) tablet 400 mg, 400 mg, Oral, BID, 400 mg at 07/01/23 0953 **FOLLOWED BY** [START ON 07/04/2023] amiodarone (PACERONE)  tablet 200 mg, 200 mg, Oral, BID **FOLLOWED BY** [START ON 07/11/2023] amiodarone (PACERONE) tablet 200 mg, 200 mg, Oral, Daily, Lyn Hollingshead, Natalie, DO   apixaban Everlene Balls) tablet 5 mg, 5 mg, Oral, BID, Sunnie Nielsen, DO, 5 mg at 07/01/23 0953   budesonide (PULMICORT) nebulizer solution 0.5 mg, 0.5 mg, Nebulization, Q12H, Sunnie Nielsen, DO, 0.5 mg at 07/01/23 0931   ceFEPIme (MAXIPIME) 2 g in sodium chloride 0.9 % 100 mL IVPB, 2 g, Intravenous, Q12H, Effie Shy, RPH, Last Rate: 200 mL/hr at 07/01/23 0957, 2 g at 07/01/23 0957   feeding supplement (ENSURE ENLIVE / ENSURE PLUS) liquid 237 mL, 237 mL, Oral, BID BM, Sunnie Nielsen, DO, 237 mL at 07/01/23 0954   LORazepam (ATIVAN) injection 0.5 mg, 0.5 mg, Intravenous, Q6H PRN, Sunnie Nielsen, DO, 0.5 mg at 06/30/23 2130   metoprolol tartrate (LOPRESSOR) injection 10 mg, 10 mg, Intravenous, Q8H, Sunnie Nielsen, DO, 10 mg at 07/01/23 0520   multivitamin with minerals tablet 1 tablet, 1 tablet, Oral, Daily, Sunnie Nielsen, DO, 1 tablet at 07/01/23 0954   pantoprazole (PROTONIX) EC tablet 40 mg, 40 mg, Oral, Daily, Sunnie Nielsen, DO, 40 mg at 07/01/23 4098   predniSONE (DELTASONE) tablet 40 mg, 40 mg, Oral, Q breakfast, Sunnie Nielsen, DO, 40 mg at 07/01/23 1191   sertraline (ZOLOFT) tablet 100 mg, 100 mg, Oral, Daily, Sunnie Nielsen, DO, 100 mg at 07/01/23 4782   sodium chloride flush (NS) 0.9 % injection 3 mL, 3 mL, Intravenous, PRN, Sunnie Nielsen, DO   umeclidinium-vilanterol (ANORO ELLIPTA) 62.5-25 MCG/ACT 1 puff, 1 puff, Inhalation, Daily, Sunnie Nielsen, DO, 1 puff at 07/01/23 1029   vancomycin (VANCOCIN) IVPB 750 mg/150 ml premix, 750 mg, Intravenous, Q24H, Barrie Folk, RPH, Stopped at 06/30/23 1914    ALLERGIES   Bupropion     REVIEW OF SYSTEMS    Review of Systems:  Gen:  Denies  fever, sweats, chills weigh loss  HEENT: Denies blurred vision, double vision, ear pain, eye pain,  hearing loss, nose bleeds, sore throat Cardiac:  No dizziness, chest pain or heaviness, chest tightness,edema Resp:   reports dyspnea chronically  Gi: Denies swallowing difficulty, stomach pain, nausea or vomiting, diarrhea, constipation, bowel incontinence Gu:  Denies bladder incontinence, burning urine Ext:   Denies Joint pain, stiffness or swelling Skin: Denies  skin rash, easy bruising or bleeding or hives Endoc:  Denies polyuria, polydipsia , polyphagia or weight change Psych:   Denies depression, insomnia or hallucinations   Other:  All other systems negative   VS: BP 126/60 (BP Location: Right Arm)   Pulse (!) 115   Temp 98.6 F (37 C)   Resp 20   Ht 5\' 2"  (1.575 m)   Wt 50 kg   SpO2 96%  BMI 20.16 kg/m      PHYSICAL EXAM    GENERAL:NAD, no fevers, chills, no weakness no fatigue HEAD: Normocephalic, atraumatic.  EYES: Pupils equal, round, reactive to light. Extraocular muscles intact. No scleral icterus.  MOUTH: Moist mucosal membrane. Dentition intact. No abscess noted.  EAR, NOSE, THROAT: Clear without exudates. No external lesions.  NECK: Supple. No thyromegaly. No nodules. No JVD.  PULMONARY: decreased breath sounds with mild rhonchi worse at bases bilaterally.  CARDIOVASCULAR: S1 and S2. Regular rate and rhythm. No murmurs, rubs, or gallops. No edema. Pedal pulses 2+ bilaterally.  GASTROINTESTINAL: Soft, nontender, nondistended. No masses. Positive bowel sounds. No hepatosplenomegaly.  MUSCULOSKELETAL: No swelling, clubbing, or edema. Range of motion full in all extremities.  NEUROLOGIC: Cranial nerves II through XII are intact. No gross focal neurological deficits. Sensation intact. Reflexes intact.  SKIN: No ulceration, lesions, rashes, or cyanosis. Skin warm and dry. Turgor intact.  PSYCHIATRIC: Mood, affect within normal limits. The patient is awake, alert and oriented x 3. Insight, judgment intact.       IMAGING        ASSESSMENT/PLAN       Acute on chronic hypoxemic respiratory failure   Baseline 2L/min   - this is due to combination of acute on chronic COPD but mostly CHF ecacerbation due to objective evidence of fluid overload and cardiac dysfunction  07/01/23- patient approaching her baseline in terms of O2 req   2. Acute decompensated diastolic CHF eith EF >55% -interstitial edema with right moderate pleural effusion   - elevated cardiac biomarkerts   1,489.4 High  1,525.9 High  CM  1,629.4 High  CM 1,169.8 High  CM 94.0 CM 63.0   Comment: Performed at [facility], [address] [place], [place] [address]  She has no ankle edema but does have abominal edema and JVD elevation - will order thoracentesis and diuretics to help compressive atelectasis by draining right pleural space -aldactone and torsemide post thoracentesis added   3. Compressive atelectasis  - metaneb BID with RT -incentive spirometry  -flutter valve     4. Altered mental status with encephalopathy    - no metabolic derrangements  -blood cultures negative x 24h does not seem septic - possibly due to insomnia and steroid therapy vs dementia         Thank you for allowing me to participate in the care of this patient.   Patient/Family are satisfied with care plan and all questions have been answered.    Provider disclosure: Patient with at least one acute or chronic illness or injury that poses a threat to life or bodily function and is being managed actively during this encounter.  All of the below services have been performed independently by signing provider:  review of prior documentation from internal and or external health records.  Review of previous and current lab results.  Interview and comprehensive assessment during patient visit today. Review of current and previous chest radiographs/CT scans. Discussion of management and test interpretation with health care team and patient/family.   This document was prepared  using Dragon voice recognition software and may include unintentional dictation errors.     Vida Rigger, M.D.  Division of Pulmonary & Critical Care Medicine

## 2023-07-01 NOTE — Progress Notes (Signed)
PROGRESS NOTE    Taylor Chambers   WUX:[id number] DOB: Sep 07, 1945  DOA: 06/29/2023 Date of Service: 07/01/23 which is hospital day 2  PCP: Mick Sell, MD     Hospital course / significant events:   HPI: Taylor Chambers is a 78 y.o. female with medical history significant of COPD on 2-3L home O2 [place] and OSA w/ CPAP qhs, CAD, atrial fibrillation, HFpEF w/ Grade II diastolic dysfunction, mitral insufficiency, hyperlipidemia, hypertension, tobacco abuse. She was admitted recently 06/24/23 and discharged 01/22 (day prior to this admission) following treatment for COPD exacerbation, Afib RVR, HFpEF exacerbation all resulting in acute hypercarbic respiratory failure on chronic hypoxic respiratory failure, requiring BiPap. 06/29/23 presented to ED from home via EMS chief complaint SOB. She states 01/22 she felt fine when home from the hospital, slept okay overnight and compliant w/ CPAP and other meds, awoke and felt okay until relatively sudden worsening SOB when ambulating to the bathroom.   01/23: admitted for resp failure again requiring BiPap support. Tx for HCAP, COPD, and question component of HFpEF. Remained in BiPap thru evening and overnight.  01/24: in BiPap this morning then to [place] but still increased WOB, back on BiPap. Pulmonary consult - CHF seems more likely, initiate diuresis, also ordered for thoracentesis which yielded 300 cc. Palliative care following.  01/25: diuresing well      Consultants:  Pulmonology Palliative care   Procedures/Surgeries: 01/24 - R thoracentesis yield 300 cc       ASSESSMENT & PLAN:   Acute on chronic respiratory failure with hypoxia and hypercapnia Supplemental O2 BiPAP --> [place] VBG/ABG as needed if worsening  Underlying causes as below - concern for COPD / HCAP / CHF    COPD w/ (+)SIRS d/t COPD exacerbation Hx CAP recent admission Question HCAP now, in which case (+)Severe Sepsis d/t HCAP CXR not convincing for pneumonia but given  leukocytosis w/ L shift and resp distress, recent hospitalization, there is concern for HCAP Abx: cefepime + vancomycin LABA/LAMA Anoro  ICS budesonide nebs  SABA prn albuterol nebs S/p SoluMedrol, resume prednisone   Supplemental O2 BiPap at bedtime / as needed Follow Sputum culture --> never collected  Follow Resp PCR --> never collected Follow Legionella Ag --> negative Follow Strep pneumo Ag --> negative  VBG/ABG as needed if worsening  Follow Procalcitonin added on, if normal will consider d/c abx --> borderline, continue abx for now  Follow MRSA screen, if negative will consider d/c Vanc --> negative, will d/c vancomycin  Given readmission, will consult pulmonary Low threshold for further imaging    Compressive atelectasis  metaneb BID with RT incentive spirometry  flutter valve   HFpEF exacerbation  high BNP and pt was not sent home on diuretics.  No LE edema, but BNP w/ CXR infiltrate and faint rales which would support CHF diagnosis  Cautious diuresis  Will not repeat Echo given recently done Strict I&O --> incomplete documentation early admission  Continue telemetry  Consider cardiology consult pending clinical course  Follow repeat CXR after thoracentesis --> pleural effusion o nL appears worse compared to CXR 01/20 Trend BNP --> very slight improvement, suspect likely chronically elevated and would be helpful to know measurement at stable baseline, consider this outpatient    Pleural effusion S/p R Thoracentesis  Follow repeat CXR after thoracentesis --> pleural effusion on L appears worse compared to CXR 01/20  CAD Elevated troponin  No chest pain. Suspect troponin d/t demand/supply mismatch given respiratory distress EKG for chest pain /  as needed Telemetry  Continue Eliquis, beta blocker Holding statin d/t transaminitis  Holding ACE/ARB d/t soft BP    Advanced care planning  Discussed code status - pt and family wish for full code Palliative care  following    Transaminitis  ALT 214 on admission, 4-5 days ago was 60-80. Values normal 1 year prior. Question shock liver d/t hypoperfusion Monitor CMP --> improving If not improving, broaden workup   Paroxysmal Afib Metoprolol at lower dose (50 mg --> 25 mg) given soft BP Continue home amiodarone (400 mg bid for now, then 07/04/23 can start 200 mg bid if she is still in hospital, see cardiology note 01/22)  Continue home Eliquis  Continue telemetry    GERD Continue home PPI   Anxiety/Depression Continue home Zoloft Very anxious on BiPap --> ativan IV low dose prn          DVT prophylaxis: Eliquis IV fluids: no continuous IV fluids  Nutrition: low salt cardiac diet  Central lines / invasive devices: none  Code Status: FULL CODE  ACP documentation reviewed: none on file in [facility]  TOC needs: TBD Barriers to dispo / significant pending items: resp fail, diuresing              Subjective / Brief ROS:  Patient reports breathing is better today -  Denies CP Pain controlled.  Denies new weakness.  Tolerating diet.  Reports no concerns w/ urination/defecation.   Family Communication: family at bedside on rounds     Objective Findings:  Vitals:   07/01/23 0813 07/01/23 0931 07/01/23 1104 07/01/23 1556  BP: (!) 142/96  126/60 (!) 121/56  Pulse: (!) 118 (!) 115  74  Resp: 20 20  20   Temp: 98.7 F (37.1 C)  98.6 F (37 C) 98.6 F (37 C)  TempSrc: Axillary   Axillary  SpO2: 99% 98% 96% 98%  Weight:      Height:        Intake/Output Summary (Last 24 hours) at 07/01/2023 1637 Last data filed at 07/01/2023 1500 Gross per 24 hour  Intake 270 ml  Output 3450 ml  Net -3180 ml   Filed Weights   06/29/23 0742  Weight: 50 kg    Examination:  Physical Exam Constitutional:      Appearance: She is not toxic-appearing.  Cardiovascular:     Rate and Rhythm: Normal rate and regular rhythm.  Pulmonary:     Breath sounds: No wheezing or rales.      Comments: examined on nasal cannula, not able to speak full sentences but does appear more calm compared to yesterday when I saw her on nasal cannula  Abdominal:     Palpations: Abdomen is soft.  Musculoskeletal:     Right lower leg: No edema.     Left lower leg: No edema.  Skin:    General: Skin is warm and dry.  Neurological:     Mental Status: She is alert and oriented to person, place, and time. Mental status is at baseline.  Psychiatric:        Mood and Affect: Mood normal.        Behavior: Behavior normal.          Scheduled Medications:   amiodarone  400 mg Oral BID   Followed by   Melene Muller ON 07/04/2023] amiodarone  200 mg Oral BID   Followed by   Melene Muller ON 07/11/2023] amiodarone  200 mg Oral Daily   apixaban  5 mg Oral BID  budesonide (PULMICORT) nebulizer solution  0.5 mg Nebulization Q12H   feeding supplement  237 mL Oral BID BM   metoprolol tartrate  10 mg Intravenous Q8H   multivitamin with minerals  1 tablet Oral Daily   pantoprazole  40 mg Oral Daily   predniSONE  40 mg Oral Q breakfast   sertraline  100 mg Oral Daily   spironolactone  25 mg Oral Daily   torsemide  20 mg Oral Daily   umeclidinium-vilanterol  1 puff Inhalation Daily    Continuous Infusions:  ceFEPime (MAXIPIME) IV 2 g (07/01/23 0957)   vancomycin 750 mg (07/01/23 1202)    PRN Medications:  albuterol, LORazepam, sodium chloride flush  Antimicrobials from admission:  Anti-infectives (From admission, onward)    Start     Dose/Rate Route Frequency Ordered Stop   06/30/23 1100  vancomycin (VANCOREADY) IVPB 750 mg/150 mL  Status:  Discontinued        750 mg 150 mL/hr over 60 Minutes Intravenous Every 24 hours 06/29/23 1745 06/30/23 0748   06/30/23 1100  vancomycin (VANCOCIN) IVPB 750 mg/150 ml premix        750 mg 150 mL/hr over 60 Minutes Intravenous Every 24 hours 06/30/23 0747     06/29/23 2200  ceFEPIme (MAXIPIME) 2 g in sodium chloride 0.9 % 100 mL IVPB        2 g 200 mL/hr over 30  Minutes Intravenous Every 12 hours 06/29/23 1745     06/29/23 0900  vancomycin (VANCOCIN) IVPB 1000 mg/200 mL premix        1,000 mg 200 mL/hr over 60 Minutes Intravenous  Once 06/29/23 0846 06/29/23 1232   06/29/23 0900  ceFEPIme (MAXIPIME) 2 g in sodium chloride 0.9 % 100 mL IVPB        2 g 200 mL/hr over 30 Minutes Intravenous  Once 06/29/23 0846 06/29/23 1020   06/29/23 0900  azithromycin (ZITHROMAX) 500 mg in sodium chloride 0.9 % 250 mL IVPB        500 mg 250 mL/hr over 60 Minutes Intravenous  Once 06/29/23 0846 06/29/23 1051           Data Reviewed:  I have personally reviewed the following...  CBC: Recent Labs  Lab 06/25/23 0436 06/26/23 0900 06/27/23 0414 06/29/23 0748 06/30/23 0631  WBC 21.0* 25.4* 19.8* 21.8* 18.5*  NEUTROABS  --   --   --  17.7*  --   HGB 11.4* 12.6 12.0 14.0 12.5  HCT 34.2* 40.2 36.9 46.0 39.5  MCV 102.7* 109.5* 103.7* 114.1* 107.3*  PLT 385 478* 378 429* 329   Basic Metabolic Panel: Recent Labs  Lab 06/25/23 0435 06/25/23 0436 06/26/23 0243 06/27/23 0414 06/28/23 0422 06/29/23 0748 06/30/23 0631  NA  --    < > 137 139 141 140 145  K  --    < > 4.0 3.6 3.8 4.3 3.9  CL  --    < > 96* 100 105 103 108  CO2  --    < > 26 26 27 23 26   GLUCOSE  --    < > 176* 225* 156* 188* 119*  BUN  --    < > 33* 40* 29* 32* 33*  CREATININE  --    < > 1.05* 1.08* 0.77 0.91 0.82  CALCIUM  --    < > 8.2* 7.9* 8.3* 8.1* 7.8*  MG 2.2  --   --   --   --   --   --    < > =  values in this interval not displayed.   GFR: Estimated Creatinine Clearance: 46.1 mL/min (by C-G formula based on SCr of 0.82 mg/dL). Liver Function Tests: Recent Labs  Lab 06/25/23 0436 06/29/23 0748 06/30/23 0631  AST 42* 60* 38  ALT 61* 214* 152*  ALKPHOS 63 74 60  BILITOT 1.0 1.2 1.0  PROT 6.0* 6.7 5.7*  ALBUMIN 3.4* 3.4* 3.1*   No results for input(s): "LIPASE", "AMYLASE" in the last 168 hours. No results for input(s): "AMMONIA" in the last 168 hours. Coagulation  Profile: No results for input(s): "INR", "PROTIME" in the last 168 hours.  Cardiac Enzymes: No results for input(s): "CKTOTAL", "CKMB", "CKMBINDEX", "TROPONINI" in the last 168 hours. BNP (last 3 results) No results for input(s): "PROBNP" in the last 8760 hours. HbA1C: No results for input(s): "HGBA1C" in the last 72 hours. CBG: Recent Labs  Lab 06/27/23 1138 06/27/23 1658 06/27/23 2122 06/28/23 0810 06/28/23 1110  GLUCAP 142* 165* 167* 128* 137*   Lipid Profile: No results for input(s): "CHOL", "HDL", "LDLCALC", "TRIG", "CHOLHDL", "LDLDIRECT" in the last 72 hours. Thyroid Function Tests: No results for input(s): "TSH", "T4TOTAL", "FREET4", "T3FREE", "THYROIDAB" in the last 72 hours. Anemia Panel: No results for input(s): "VITAMINB12", "FOLATE", "FERRITIN", "TIBC", "IRON", "RETICCTPCT" in the last 72 hours. Most Recent Urinalysis On File:     Component Value Date/Time   COLORURINE YELLOW (A) 06/29/2023 1448   APPEARANCEUR CLOUDY (A) 06/29/2023 1448   LABSPEC 1.029 06/29/2023 1448   PHURINE 5.0 06/29/2023 1448   GLUCOSEU NEGATIVE 06/29/2023 1448   HGBUR NEGATIVE 06/29/2023 1448   BILIRUBINUR NEGATIVE 06/29/2023 1448   KETONESUR NEGATIVE 06/29/2023 1448   PROTEINUR >=300 (A) 06/29/2023 1448   NITRITE NEGATIVE 06/29/2023 1448   LEUKOCYTESUR NEGATIVE 06/29/2023 1448   Sepsis Labs: @LABRCNTIP (procalcitonin:4,lacticidven:4) Microbiology: Recent Results (from the past 240 hours)  Blood culture (routine x 2)     Status: None   Collection Time: 06/24/23  5:34 AM   Specimen: BLOOD  Result Value Ref Range Status   Specimen Description BLOOD BLOOD RIGHT ARM  Final   Special Requests   Final    BOTTLES DRAWN AEROBIC AND ANAEROBIC Blood Culture results may not be optimal due to an inadequate volume of blood received in culture bottles   Culture   Final    NO GROWTH 5 DAYS Performed at [facility], [address] [place], [place] [address]    Report Status  06/29/2023 FINAL  Final  Blood culture (routine x 2)     Status: None   Collection Time: 06/24/23  5:43 AM   Specimen: BLOOD  Result Value Ref Range Status   Specimen Description BLOOD BLOOD RIGHT FOREARM  Final   Special Requests   Final    BOTTLES DRAWN AEROBIC ONLY Blood Culture results may not be optimal due to an inadequate volume of blood received in culture bottles   Culture   Final    NO GROWTH 5 DAYS Performed at [facility], [address] [place], [place] [address]    Report Status 06/29/2023 FINAL  Final  Resp panel by RT-PCR (RSV, Flu A&B, Covid) Anterior Nasal Swab     Status: None   Collection Time: 06/24/23  5:43 AM   Specimen: Anterior Nasal Swab  Result Value Ref Range Status   SARS Coronavirus 2 by RT PCR NEGATIVE NEGATIVE Final    Comment: (NOTE) SARS-CoV-2 target nucleic acids are NOT DETECTED.  The SARS-CoV-2 RNA is generally detectable in upper respiratory specimens during  the acute phase of infection. The lowest concentration of SARS-CoV-2 viral copies this assay can detect is 138 copies/mL. A negative result does not preclude SARS-Cov-2 infection and should not be used as the sole basis for treatment or other patient management decisions. A negative result may occur with  improper specimen collection/handling, submission of specimen other than nasopharyngeal swab, presence of viral mutation(s) within the areas targeted by this assay, and inadequate number of viral copies(<138 copies/mL). A negative result must be combined with clinical observations, patient history, and epidemiological information. The expected result is Negative.  Fact Sheet for Patients:  BloggerCourse.com  Fact Sheet for Healthcare Providers:  SeriousBroker.it  This test is no t yet approved or cleared by the [place] FDA and  has been authorized for detection and/or diagnosis of SARS-CoV-2 by FDA under an Emergency  Use Authorization (EUA). This EUA will remain  in effect (meaning this test can be used) for the duration of the COVID-19 declaration under Section 564(b)(1) of the Act, 21 U.S.C.section 360bbb-3(b)(1), unless the authorization is terminated  or revoked sooner.       Influenza A by PCR NEGATIVE NEGATIVE Final   Influenza B by PCR NEGATIVE NEGATIVE Final    Comment: (NOTE) The Xpert Xpress SARS-CoV-2/FLU/RSV plus assay is intended as an aid in the diagnosis of influenza from Nasopharyngeal swab specimens and should not be used as a sole basis for treatment. Nasal washings and aspirates are unacceptable for Xpert Xpress SARS-CoV-2/FLU/RSV testing.  Fact Sheet for Patients: BloggerCourse.com  Fact Sheet for Healthcare Providers: SeriousBroker.it  This test is not yet approved or cleared by the [place] FDA and has been authorized for detection and/or diagnosis of SARS-CoV-2 by FDA under an Emergency Use Authorization (EUA). This EUA will remain in effect (meaning this test can be used) for the duration of the COVID-19 declaration under Section 564(b)(1) of the Act, 21 U.S.C. section 360bbb-3(b)(1), unless the authorization is terminated or revoked.     Resp Syncytial Virus by PCR NEGATIVE NEGATIVE Final    Comment: (NOTE) Fact Sheet for Patients: BloggerCourse.com  Fact Sheet for Healthcare Providers: SeriousBroker.it  This test is not yet approved or cleared by the [place] FDA and has been authorized for detection and/or diagnosis of SARS-CoV-2 by FDA under an Emergency Use Authorization (EUA). This EUA will remain in effect (meaning this test can be used) for the duration of the COVID-19 declaration under Section 564(b)(1) of the Act, 21 U.S.C. section 360bbb-3(b)(1), unless the authorization is terminated or revoked.  Performed at [facility], [address] [place], [place] [address]   MRSA Next Gen by PCR, Nasal     Status: None   Collection Time: 06/25/23  8:34 AM   Specimen: Nasal Mucosa; Nasal Swab  Result Value Ref Range Status   MRSA by PCR Next Gen NOT DETECTED NOT DETECTED Final    Comment: (NOTE) The GeneXpert MRSA Assay (FDA approved for NASAL specimens only), is one component of a comprehensive MRSA colonization surveillance program. It is not intended to diagnose MRSA infection nor to guide or monitor treatment for MRSA infections. Test performance is not FDA approved in patients less than 39 years old. Performed at [facility], [address] [place], [place] [address]   Respiratory (~20 pathogens) panel by PCR     Status: None   Collection Time: 06/25/23  9:24 AM   Specimen: Nasopharyngeal Swab; Respiratory  Result Value Ref Range Status   Adenovirus NOT DETECTED NOT  DETECTED Final   Coronavirus 229E NOT DETECTED NOT DETECTED Final    Comment: (NOTE) The Coronavirus on the Respiratory Panel, DOES NOT test for the novel  Coronavirus (2019 nCoV)    Coronavirus HKU1 NOT DETECTED NOT DETECTED Final   Coronavirus NL63 NOT DETECTED NOT DETECTED Final   Coronavirus OC43 NOT DETECTED NOT DETECTED Final   Metapneumovirus NOT DETECTED NOT DETECTED Final   Rhinovirus / Enterovirus NOT DETECTED NOT DETECTED Final   Influenza A NOT DETECTED NOT DETECTED Final   Influenza B NOT DETECTED NOT DETECTED Final   Parainfluenza Virus 1 NOT DETECTED NOT DETECTED Final   Parainfluenza Virus 2 NOT DETECTED NOT DETECTED Final   Parainfluenza Virus 3 NOT DETECTED NOT DETECTED Final   Parainfluenza Virus 4 NOT DETECTED NOT DETECTED Final   Respiratory Syncytial Virus NOT DETECTED NOT DETECTED Final   Bordetella pertussis NOT DETECTED NOT DETECTED Final   Bordetella Parapertussis NOT DETECTED NOT DETECTED Final   Chlamydophila pneumoniae NOT DETECTED NOT DETECTED Final   Mycoplasma pneumoniae NOT DETECTED NOT DETECTED  Final    Comment: Performed at [facility], [address] [place], [place] [address]  Blood Culture (routine x 2)     Status: None (Preliminary result)   Collection Time: 06/29/23  7:48 AM   Specimen: BLOOD  Result Value Ref Range Status   Specimen Description BLOOD BLOOD RIGHT FOREARM  Final   Special Requests   Final    BOTTLES DRAWN AEROBIC AND ANAEROBIC Blood Culture results may not be optimal due to an inadequate volume of blood received in culture bottles   Culture   Final    NO GROWTH 2 DAYS Performed at [facility], [address] [place], [place] [address]    Report Status PENDING  Incomplete  Blood Culture (routine x 2)     Status: None (Preliminary result)   Collection Time: 06/29/23  7:48 AM   Specimen: BLOOD  Result Value Ref Range Status   Specimen Description BLOOD RIGHT ANTECUBITAL  Final   Special Requests   Final    BOTTLES DRAWN AEROBIC AND ANAEROBIC Blood Culture results may not be optimal due to an inadequate volume of blood received in culture bottles   Culture   Final    NO GROWTH 2 DAYS Performed at [facility], [address] [place], [place] [address]    Report Status PENDING  Incomplete  Body fluid culture w Gram Stain     Status: None (Preliminary result)   Collection Time: 06/30/23  4:33 PM   Specimen: PATH Cytology Pleural fluid  Result Value Ref Range Status   Specimen Description   Final    PLEURAL Performed at [facility], [address], [place], [place] [address]    Special Requests   Final    PLEURAL Performed at [facility], [address] [place], [place] [address]    Gram Stain   Final    WBC PRESENT, PREDOMINANTLY MONONUCLEAR NO ORGANISMS SEEN CYTOSPIN SMEAR    Culture   Final    NO GROWTH < 24 HOURS Performed at [facility], [address], [place], [place] [address]    Report Status PENDING  Incomplete  MRSA Next Gen by PCR, Nasal     Status: None   Collection  Time: 07/01/23 11:35 AM   Specimen: Nasal Mucosa; Nasal Swab  Result Value Ref Range Status   MRSA by PCR Next Gen NOT DETECTED NOT DETECTED Final  Comment: (NOTE) The GeneXpert MRSA Assay (FDA approved for NASAL specimens only), is one component of a comprehensive MRSA colonization surveillance program. It is not intended to diagnose MRSA infection nor to guide or monitor treatment for MRSA infections. Test performance is not FDA approved in patients less than 64 years old. Performed at [facility], [address], [place], [place] [address]       Radiology Studies last 3 days: US THORACENTESIS ASP PLEURAL SPACE W/IMG GUIDE Result Date: 06/30/2023 INDICATION: Patient admitted with respiratory failure presents today with pleural effusions. Interventional radiology asked to perform a diagnostic and therapeutic thoracentesis. EXAM: ULTRASOUND GUIDED THORACENTESIS MEDICATIONS: 1% lidocaine 10 mL COMPLICATIONS: None immediate. PROCEDURE: An ultrasound guided thoracentesis was thoroughly discussed with the patient and questions answered. The benefits, risks, alternatives and complications were also discussed. The patient understands and wishes to proceed with the procedure. Written consent was obtained. Ultrasound was performed to localize and mark an adequate pocket of fluid in the right chest. The area was then prepped and draped in the normal sterile fashion. 1% Lidocaine was used for local anesthesia. Under ultrasound guidance a 6 Fr Safe-T-Centesis catheter was introduced. Thoracentesis was performed. The catheter was removed and a dressing applied. FINDINGS: A total of approximately 300 mL of clear yellow fluid was removed. Samples were sent to the laboratory as requested by the clinical team. IMPRESSION: Successful ultrasound guided diagnostic and therapeutic RIGHT thoracentesis yielding 300 mL of pleural fluid. Procedure performed by Alwyn Ren NP Electronically Signed   By:  Roanna Banning M.D.   On: 06/30/2023 17:03   DG Chest Port 1 View Result Date: 06/30/2023 CLINICAL DATA:  Pleural effusion, post thoracentesis EXAM: PORTABLE CHEST - 1 VIEW COMPARISON:  06/29/2023 FINDINGS: No pneumothorax. Persistent blunting of the left lateral costophrenic angle. Airspace opacities in the mid and lower left lung as before. Improvement of right lung interstitial opacities. Heart size and mediastinal contours are within normal limits. Aortic Atherosclerosis (ICD10-170.0). Visualized bones unremarkable. IMPRESSION: 1. No pneumothorax. 2. Persistent left pleural effusion and left lung airspace disease. Electronically Signed   By: Corlis Leak M.D.   On: 06/30/2023 16:53   DG Chest Port 1 View Result Date: 06/29/2023 CLINICAL DATA:  Questionable sepsis - evaluate for abnormality EXAM: PORTABLE CHEST 1 VIEW COMPARISON:  CT chest 06/24/2023, chest x-ray 06/26/2023 FINDINGS: The heart and mediastinal contours are within normal limits. Atherosclerotic plaque. Slight interval worsening of patchy airspace opacities most prominent along the left upper lobe. Question interstitial markings with no overt pulmonary edema. Flattening of the hemidiaphragms with blunting of the costophrenic angles and likely trace pleural effusions. No pneumothorax. No acute osseous abnormality. IMPRESSION: 1. Slight interval worsening of patchy airspace opacities most prominent along the left upper lobe. Findings suggest infection. Followup PA and lateral chest X-ray is recommended in 3-4 weeks following therapy to ensure resolution. 2. Bilateral trace pleural effusions. 3. Aortic Atherosclerosis (ICD10-I70.0) and Emphysema (ICD10-J43.9). Electronically Signed   By: Tish Frederickson M.D.   On: 06/29/2023 08:05       Time spent: 50 min    Sunnie Nielsen, DO [facility] 07/01/2023, 4:37 PM    Dictation software may have been used to generate the above note. Typos may occur and escape review in  typed/dictated notes. Please contact Dr Lyn Hollingshead directly for clarity if needed.  Staff may message me via secure chat in Epic  but this may not receive an immediate response,  please page me for urgent matters!  If 7PM-7AM,  please contact night coverage [email or web address]

## 2023-07-02 ENCOUNTER — Inpatient Hospital Stay: Payer: Medicare HMO

## 2023-07-02 DIAGNOSIS — J90 Pleural effusion, not elsewhere classified: Secondary | ICD-10-CM | POA: Diagnosis not present

## 2023-07-02 DIAGNOSIS — J9621 Acute and chronic respiratory failure with hypoxia: Secondary | ICD-10-CM | POA: Diagnosis not present

## 2023-07-02 DIAGNOSIS — J189 Pneumonia, unspecified organism: Secondary | ICD-10-CM

## 2023-07-02 DIAGNOSIS — Z515 Encounter for palliative care: Secondary | ICD-10-CM

## 2023-07-02 LAB — CBC
HCT: 39.9 % (ref 36.0–46.0)
Hemoglobin: 13.3 g/dL (ref 12.0–15.0)
MCH: 33.8 pg (ref 26.0–34.0)
MCHC: 33.3 g/dL (ref 30.0–36.0)
MCV: 101.5 fL — ABNORMAL HIGH (ref 80.0–100.0)
Platelets: 280 10*3/uL (ref 150–400)
RBC: 3.93 MIL/uL (ref 3.87–5.11)
RDW: 14.4 % (ref 11.5–15.5)
WBC: 21 10*3/uL — ABNORMAL HIGH (ref 4.0–10.5)
nRBC: 0.2 % (ref 0.0–0.2)

## 2023-07-02 LAB — COMPREHENSIVE METABOLIC PANEL
ALT: 97 U/L — ABNORMAL HIGH (ref 0–44)
AST: 29 U/L (ref 15–41)
Albumin: 3.1 g/dL — ABNORMAL LOW (ref 3.5–5.0)
Alkaline Phosphatase: 56 U/L (ref 38–126)
Anion gap: 9 (ref 5–15)
BUN: 31 mg/dL — ABNORMAL HIGH (ref 8–23)
CO2: 41 mmol/L — ABNORMAL HIGH (ref 22–32)
Calcium: 8.4 mg/dL — ABNORMAL LOW (ref 8.9–10.3)
Chloride: 93 mmol/L — ABNORMAL LOW (ref 98–111)
Creatinine, Ser: 0.78 mg/dL (ref 0.44–1.00)
GFR, Estimated: >60 mL/min (ref >60)
Glucose, Bld: 125 mg/dL — ABNORMAL HIGH (ref 70–99)
Potassium: 3.9 mmol/L (ref 3.5–5.1)
Sodium: 143 mmol/L (ref 135–145)
Total Bilirubin: 1.4 mg/dL — ABNORMAL HIGH (ref 0.0–1.2)
Total Protein: 5.7 g/dL — ABNORMAL LOW (ref 6.5–8.1)

## 2023-07-02 LAB — BLOOD GAS, ARTERIAL
Acid-Base Excess: 21.6 mmol/L — ABNORMAL HIGH (ref 0.0–2.0)
Bicarbonate: 46.1 mmol/L — ABNORMAL HIGH (ref 20.0–28.0)
O2 Content: 2 L/min
O2 Saturation: 97.1 %
Patient temperature: 37
pCO2 arterial: 47 mm[Hg] (ref 32–48)
pH, Arterial: 7.6 — ABNORMAL HIGH (ref 7.35–7.45)
pO2, Arterial: 72 mm[Hg] — ABNORMAL LOW (ref 83–108)

## 2023-07-02 MED ORDER — FUROSEMIDE 10 MG/ML IJ SOLN
40.0000 mg | Freq: Once | INTRAMUSCULAR | Status: AC
  Administered 2023-07-02: 40 mg via INTRAVENOUS
  Filled 2023-07-02: qty 4

## 2023-07-02 MED ORDER — SULFAMETHOXAZOLE-TRIMETHOPRIM 400-80 MG PO TABS
1.0000 | ORAL_TABLET | Freq: Two times a day (BID) | ORAL | Status: DC
  Administered 2023-07-02 – 2023-07-03 (×3): 1 via ORAL
  Filled 2023-07-02 (×4): qty 1

## 2023-07-02 MED ORDER — PREDNISONE 20 MG PO TABS
20.0000 mg | ORAL_TABLET | Freq: Every day | ORAL | Status: DC
  Administered 2023-07-02 – 2023-07-03 (×2): 20 mg via ORAL
  Filled 2023-07-02 (×2): qty 1

## 2023-07-02 NOTE — Progress Notes (Signed)
Daily Progress Note   Patient Name: Taylor Chambers       Date: 07/02/2023 DOB: Aug 12, 1945  Age: 78 y.o. MRN#: [id number] Attending Physician: Sunnie Nielsen, DO Primary Care Physician: Mick Sell, MD Admit Date: 06/29/2023  Reason for Consultation/Follow-up: Establishing goals of care  HPI/Brief Hospital Review: 78 y.o. female  with past medical history of COPD with chronic respiratory failire on 2-3 L [place] at baseline, OSA on CPAP, CAD, atrial fibrillation, HFpEF, HTN and HLD admitted from home on 06/29/2023 with [facility].   Noted recent admission 1/18-1/22 for AECOPD, A. Fib RVR, CHF exaceration and acute on chronic respiratory failure, discharged home   Reportedly developed sudden shortness of breath, EMS called and brought to ED on bipap WBC and BNP elevated, concern for HCAP given recent admission and CXR findings   Underwent thoracentesis 1/24-300 cc removed    Palliative medicine was consulted for assisting with goals of care conversations.  Subjective: Extensive chart review has been completed prior to meeting patient including labs, vital signs, imaging, progress notes, orders, and available advanced directive documents from current and previous encounters.    Visited with Taylor Chambers at her bedside. She is awake, alert, oriented. Mentation and respiratory continues to improve with each daily visit. Son at bedside feels Taylor Chambers has returned to her baseline. Taylor Chambers denies acute symptoms.  She shares being ready to return home but anxious as she does not want to have a recurrent hospitalization again. She shares she was able to ambulate a bit yesterday, became mildly short of breath but was able to easily recover. Still quite a bit away from her baseline but we discussed  recovery process and chronic disease trajectory.  We also discussed the importance and reasoning for CPAP/BIPAP therapy, primary team to discuss with pulmonology possible need for BIPAP therapy at home.  Taylor Chambers shares she was not able to have goals of care discussions with her husband and he will likely not be able to visit today as he is having stents removed himself today.  Encouraged Taylor Chambers to have goals of care conversations ongoing and code status can be addressed with any provider even as an outpatient.  Answered and addressed all questions and concerns. PMT will continue to follow peripherally, please reengage as appropriate.  Thank you for allowing the Palliative  Medicine Team to assist in the care of this patient.  Total time:  25 minutes  Time spent includes: Detailed review of medical records (labs, imaging, vital signs), medically appropriate exam (mental status, respiratory, cardiac, skin), discussed with treatment team, counseling and educating patient, family and staff, documenting clinical information, medication management and coordination of care.  Leeanne Deed, DNP, AGNP-C Palliative Medicine   Please contact Palliative Medicine Team phone at [phone] for questions and concerns.

## 2023-07-02 NOTE — TOC Transition Note (Signed)
Transition of Care [facility]) - Discharge Note   Patient Details  Name: Taylor Chambers MRN: [id number] Date of Birth: [birth year]  [facility] CM/SW Contact:  Luvenia Redden, RN Phone Number: 07/02/2023, 3:38 PM   Clinical Narrative:    [facility] RN attempted bedside visit however pt care being performed. TOC RN also attempted to contact the spouse Dorene Sorrow unsuccessful.    PASSR# Obtained [id number] [id number]  Will request TOC to continue outreach attempts for facility placement for choice.        Patient Goals and CMS Choice            Discharge Placement                       Discharge Plan and Services Additional resources added to the After Visit Summary for                                       Social Drivers of Health (SDOH) Interventions SDOH Screenings   Food Insecurity: No Food Insecurity (07/01/2023)  Housing: Low Risk  (07/01/2023)  Transportation Needs: No Transportation Needs (07/01/2023)  Utilities: Not At Risk (07/01/2023)  Financial Resource Strain: Low Risk  (03/02/2023)   Received from [facility]  Social Connections: Moderately Isolated (07/01/2023)  Tobacco Use: Medium Risk (06/29/2023)     Readmission Risk Interventions     No data to display

## 2023-07-02 NOTE — Plan of Care (Signed)

## 2023-07-02 NOTE — Progress Notes (Signed)
PROGRESS NOTE    Taylor Chambers   ZOX:[id number] DOB: 15-Jan-1946  DOA: 06/29/2023 Date of Service: 07/02/23 which is hospital day 3  PCP: Mick Sell, MD     Hospital course / significant events:   HPI: Taylor Chambers is a 78 y.o. female with medical history significant of COPD on 2-3L home O2 [name] and OSA w/ CPAP qhs, CAD, atrial fibrillation, HFpEF w/ Grade II diastolic dysfunction, mitral insufficiency, hyperlipidemia, hypertension, tobacco abuse. She was admitted recently 06/24/23 and discharged 01/22 (day prior to this admission) following treatment for COPD exacerbation, Afib RVR, HFpEF exacerbation all resulting in acute hypercarbic respiratory failure on chronic hypoxic respiratory failure, requiring BiPap. 06/29/23 presented to ED from home via EMS chief complaint SOB. She states 01/22 she felt fine when home from the hospital, slept okay overnight and compliant w/ CPAP and other meds, awoke and felt okay until relatively sudden worsening SOB when ambulating to the bathroom.   01/23: admitted for resp failure again requiring BiPap support. Tx for HCAP, COPD, and question component of HFpEF. Remained in BiPap thru evening and overnight.  01/24: in BiPap this morning then to [place] but still increased WOB, back on BiPap. Pulmonary consult - CHF seems more likely, initiate diuresis, also ordered for thoracentesis which yielded 300 cc. Palliative care following.  01/25: diuresing well  01/26: contraction alkalosis, holding diuretics. 2L [place] while awake, still on BiPap qhs     Consultants:  Pulmonology Palliative care   Procedures/Surgeries: 01/24 - R thoracentesis yield 300 cc       ASSESSMENT & PLAN:   Acute on chronic respiratory failure with hypoxia and hypercapnia Supplemental O2 BiPAP --> [place] VBG/ABG as needed if worsening  Underlying causes as below - concern for COPD / HCAP / CHF    COPD w/ (+)SIRS d/t COPD exacerbation Hx CAP recent admission Question HCAP now, in  which case (+)Severe Sepsis d/t HCAP CXR not convincing for pneumonia but given leukocytosis w/ L shift and resp distress, recent hospitalization, there is concern for HCAP Abx: cefepime + vancomycin - today 07/02/23 is day 4 LABA/LAMA Anoro  ICS budesonide nebs  SABA prn albuterol nebs S/p SoluMedrol, resume prednisone   Supplemental O2 BiPap at bedtime / as needed Follow Sputum culture --> never collected  Follow Resp PCR --> never collected Follow Legionella Ag --> negative Follow Strep pneumo Ag --> negative  VBG/ABG as needed if worsening  Follow Procalcitonin added on, if normal will consider d/c abx --> borderline, continue abx for now  Follow MRSA screen, if negative will consider d/c Vanc --> negative, will d/c vancomycin  Given readmission, will consult pulmonary Low threshold for further imaging    Compressive atelectasis  metaneb BID with RT incentive spirometry  flutter valve   HFpEF exacerbation  high BNP and pt was not sent home on diuretics last admission No LE edema, but BNP w/ CXR infiltrate and faint rales which would also support CHF diagnosis  Cautious diuresis - holding now d/t substantial contraction alkalosis  Will not repeat Echo given recently done Strict I&O --> incomplete documentation early admission  Continue telemetry  Consider cardiology consult pending clinical course  Follow repeat CXR after thoracentesis --> pleural effusion on L appears worse compared to CXR 01/20 Trend BNP --> very slight improvement, suspect likely chronically elevated and would be helpful to know measurement at stable baseline, consider this outpatient   Contraction alkalosis Holding diuresis today   Pleural effusion S/p R Thoracentesis  Follow repeat CXR  after thoracentesis --> pleural effusion on L appears worse compared to CXR 01/20  CAD Elevated troponin  No chest pain. Suspect troponin d/t demand/supply mismatch given respiratory distress EKG for chest pain / as  needed Telemetry  Continue Eliquis, beta blocker Holding statin d/t transaminitis  Holding ACE/ARB d/t soft BP    Advanced care planning  Discussed code status - pt and family wish for full code Palliative care following    Transaminitis  ALT 214 on admission, 4-5 days ago was 60-80. Values normal 1 year prior. Question shock liver d/t hypoperfusion Monitor CMP --> improving If not improving, broaden workup   Paroxysmal Afib Metoprolol at lower dose (50 mg --> 25 mg) given soft BP Continue home amiodarone (400 mg bid for now, then 07/04/23 can start 200 mg bid if she is still in hospital, see cardiology note 01/22)  Continue home Eliquis  Continue telemetry    GERD Continue home PPI   Anxiety/Depression Continue home Zoloft Very anxious on BiPap --> ativan IV low dose prn          DVT prophylaxis: Eliquis IV fluids: no continuous IV fluids  Nutrition: low salt cardiac diet  Central lines / invasive devices: none  Code Status: FULL CODE  ACP documentation reviewed: none on file in [facility]  TOC needs: TBD Barriers to dispo / significant pending items: resp fail, diuresing              Subjective / Brief ROS:  Patient reports breathing is better today Denies CP Reports no concerns w/ urination/defecation.   Family Communication: family at bedside on rounds     Objective Findings:  Vitals:   07/02/23 0432 07/02/23 0938 07/02/23 1307 07/02/23 1552  BP: (!) 159/86 (!) 108/91 (!) 117/52 (!) 120/56  Pulse: (!) 58 64 78 80  Resp: 20 17 18 20   Temp: 98 F (36.7 C) 98.3 F (36.8 C) 98.2 F (36.8 C) 97.8 F (36.6 C)  TempSrc: Oral     SpO2: 100% 99% 95% 96%  Weight:      Height:        Intake/Output Summary (Last 24 hours) at 07/02/2023 1630 Last data filed at 07/02/2023 1304 Gross per 24 hour  Intake 240 ml  Output 2000 ml  Net -1760 ml   Filed Weights   06/29/23 0742  Weight: 50 kg    Examination:  Physical Exam Constitutional:       General: She is not in acute distress.    Appearance: She is not toxic-appearing.  Cardiovascular:     Rate and Rhythm: Normal rate and regular rhythm.  Pulmonary:     Breath sounds: No wheezing or rales.     Comments: examined on nasal cannula, speaking full sentences  Abdominal:     Palpations: Abdomen is soft.  Musculoskeletal:     Right lower leg: No edema.     Left lower leg: No edema.  Skin:    General: Skin is warm and dry.  Neurological:     Mental Status: She is alert and oriented to person, place, and time. Mental status is at baseline.  Psychiatric:        Mood and Affect: Mood normal.        Behavior: Behavior normal.          Scheduled Medications:   amiodarone  400 mg Oral BID   Followed by   Melene Muller ON 07/04/2023] amiodarone  200 mg Oral BID   Followed by   [  START ON 07/11/2023] amiodarone  200 mg Oral Daily   apixaban  5 mg Oral BID   feeding supplement  237 mL Oral BID BM   metoprolol tartrate  10 mg Intravenous Q8H   multivitamin with minerals  1 tablet Oral Daily   pantoprazole  40 mg Oral Daily   predniSONE  20 mg Oral Q breakfast   sertraline  100 mg Oral Daily   sulfamethoxazole-trimethoprim  1 tablet Oral Q12H   umeclidinium-vilanterol  1 puff Inhalation Daily    Continuous Infusions:    PRN Medications:  albuterol, LORazepam, sodium chloride flush  Antimicrobials from admission:  Anti-infectives (From admission, onward)    Start     Dose/Rate Route Frequency Ordered Stop   07/02/23 1000  sulfamethoxazole-trimethoprim (BACTRIM) 400-80 MG per tablet 1 tablet        1 tablet Oral Every 12 hours 07/02/23 0742     06/30/23 1100  vancomycin (VANCOREADY) IVPB 750 mg/150 mL  Status:  Discontinued        750 mg 150 mL/hr over 60 Minutes Intravenous Every 24 hours 06/29/23 1745 06/30/23 0748   06/30/23 1100  vancomycin (VANCOCIN) IVPB 750 mg/150 ml premix  Status:  Discontinued        750 mg 150 mL/hr over 60 Minutes Intravenous Every 24 hours  06/30/23 0747 07/02/23 0742   06/29/23 2200  ceFEPIme (MAXIPIME) 2 g in sodium chloride 0.9 % 100 mL IVPB  Status:  Discontinued        2 g 200 mL/hr over 30 Minutes Intravenous Every 12 hours 06/29/23 1745 07/02/23 0742   06/29/23 0900  vancomycin (VANCOCIN) IVPB 1000 mg/200 mL premix        1,000 mg 200 mL/hr over 60 Minutes Intravenous  Once 06/29/23 0846 06/29/23 1232   06/29/23 0900  ceFEPIme (MAXIPIME) 2 g in sodium chloride 0.9 % 100 mL IVPB        2 g 200 mL/hr over 30 Minutes Intravenous  Once 06/29/23 0846 06/29/23 1020   06/29/23 0900  azithromycin (ZITHROMAX) 500 mg in sodium chloride 0.9 % 250 mL IVPB        500 mg 250 mL/hr over 60 Minutes Intravenous  Once 06/29/23 0846 06/29/23 1051           Data Reviewed:  I have personally reviewed the following...  CBC: Recent Labs  Lab 06/26/23 0900 06/27/23 0414 06/29/23 0748 06/30/23 0631 07/02/23 0302  WBC 25.4* 19.8* 21.8* 18.5* 21.0*  NEUTROABS  --   --  17.7*  --   --   HGB 12.6 12.0 14.0 12.5 13.3  HCT 40.2 36.9 46.0 39.5 39.9  MCV 109.5* 103.7* 114.1* 107.3* 101.5*  PLT 478* 378 429* 329 280   Basic Metabolic Panel: Recent Labs  Lab 06/27/23 0414 06/28/23 0422 06/29/23 0748 06/30/23 0631 07/02/23 0302  NA 139 141 140 145 143  K 3.6 3.8 4.3 3.9 3.9  CL 100 105 103 108 93*  CO2 26 27 23 26  41*  GLUCOSE 225* 156* 188* 119* 125*  BUN 40* 29* 32* 33* 31*  CREATININE 1.08* 0.77 0.91 0.82 0.78  CALCIUM 7.9* 8.3* 8.1* 7.8* 8.4*   GFR: Estimated Creatinine Clearance: 47.2 mL/min (by C-G formula based on SCr of 0.78 mg/dL). Liver Function Tests: Recent Labs  Lab 06/29/23 0748 06/30/23 0631 07/02/23 0302  AST 60* 38 29  ALT 214* 152* 97*  ALKPHOS 74 60 56  BILITOT 1.2 1.0 1.4*  PROT 6.7 5.7* 5.7*  ALBUMIN  3.4* 3.1* 3.1*   No results for input(s): "LIPASE", "AMYLASE" in the last 168 hours. No results for input(s): "AMMONIA" in the last 168 hours. Coagulation Profile: No results for input(s):  "INR", "PROTIME" in the last 168 hours.  Cardiac Enzymes: No results for input(s): "CKTOTAL", "CKMB", "CKMBINDEX", "TROPONINI" in the last 168 hours. BNP (last 3 results) No results for input(s): "PROBNP" in the last 8760 hours. HbA1C: No results for input(s): "HGBA1C" in the last 72 hours. CBG: Recent Labs  Lab 06/27/23 1138 06/27/23 1658 06/27/23 2122 06/28/23 0810 06/28/23 1110  GLUCAP 142* 165* 167* 128* 137*   Lipid Profile: No results for input(s): "CHOL", "HDL", "LDLCALC", "TRIG", "CHOLHDL", "LDLDIRECT" in the last 72 hours. Thyroid Function Tests: No results for input(s): "TSH", "T4TOTAL", "FREET4", "T3FREE", "THYROIDAB" in the last 72 hours. Anemia Panel: No results for input(s): "VITAMINB12", "FOLATE", "FERRITIN", "TIBC", "IRON", "RETICCTPCT" in the last 72 hours. Most Recent Urinalysis On File:     Component Value Date/Time   COLORURINE YELLOW (A) 06/29/2023 1448   APPEARANCEUR CLOUDY (A) 06/29/2023 1448   LABSPEC 1.029 06/29/2023 1448   PHURINE 5.0 06/29/2023 1448   GLUCOSEU NEGATIVE 06/29/2023 1448   HGBUR NEGATIVE 06/29/2023 1448   BILIRUBINUR NEGATIVE 06/29/2023 1448   KETONESUR NEGATIVE 06/29/2023 1448   PROTEINUR >=300 (A) 06/29/2023 1448   NITRITE NEGATIVE 06/29/2023 1448   LEUKOCYTESUR NEGATIVE 06/29/2023 1448   Sepsis Labs: @LABRCNTIP (procalcitonin:4,lacticidven:4) Microbiology: Recent Results (from the past 240 hours)  Blood culture (routine x 2)     Status: None   Collection Time: 06/24/23  5:34 AM   Specimen: BLOOD  Result Value Ref Range Status   Specimen Description BLOOD BLOOD RIGHT ARM  Final   Special Requests   Final    BOTTLES DRAWN AEROBIC AND ANAEROBIC Blood Culture results may not be optimal due to an inadequate volume of blood received in culture bottles   Culture   Final    NO GROWTH 5 DAYS Performed at [facility], [address] [place], [place] [address]    Report Status 06/29/2023 FINAL  Final  Blood culture  (routine x 2)     Status: None   Collection Time: 06/24/23  5:43 AM   Specimen: BLOOD  Result Value Ref Range Status   Specimen Description BLOOD BLOOD RIGHT FOREARM  Final   Special Requests   Final    BOTTLES DRAWN AEROBIC ONLY Blood Culture results may not be optimal due to an inadequate volume of blood received in culture bottles   Culture   Final    NO GROWTH 5 DAYS Performed at [facility], [address] [place], [place] [address]    Report Status 06/29/2023 FINAL  Final  Resp panel by RT-PCR (RSV, Flu A&B, Covid) Anterior Nasal Swab     Status: None   Collection Time: 06/24/23  5:43 AM   Specimen: Anterior Nasal Swab  Result Value Ref Range Status   SARS Coronavirus 2 by RT PCR NEGATIVE NEGATIVE Final    Comment: (NOTE) SARS-CoV-2 target nucleic acids are NOT DETECTED.  The SARS-CoV-2 RNA is generally detectable in upper respiratory specimens during the acute phase of infection. The lowest concentration of SARS-CoV-2 viral copies this assay can detect is 138 copies/mL. A negative result does not preclude SARS-Cov-2 infection and should not be used as the sole basis for treatment or other patient management decisions. A negative result may occur with  improper specimen collection/handling, submission of specimen other than nasopharyngeal swab, presence of viral  mutation(s) within the areas targeted by this assay, and inadequate number of viral copies(<138 copies/mL). A negative result must be combined with clinical observations, patient history, and epidemiological information. The expected result is Negative.  Fact Sheet for Patients:  BloggerCourse.com  Fact Sheet for Healthcare Providers:  SeriousBroker.it  This test is no t yet approved or cleared by the [place] FDA and  has been authorized for detection and/or diagnosis of SARS-CoV-2 by FDA under an Emergency Use Authorization (EUA). This EUA will  remain  in effect (meaning this test can be used) for the duration of the COVID-19 declaration under Section 564(b)(1) of the Act, 21 U.S.C.section 360bbb-3(b)(1), unless the authorization is terminated  or revoked sooner.       Influenza A by PCR NEGATIVE NEGATIVE Final   Influenza B by PCR NEGATIVE NEGATIVE Final    Comment: (NOTE) The Xpert Xpress SARS-CoV-2/FLU/RSV plus assay is intended as an aid in the diagnosis of influenza from Nasopharyngeal swab specimens and should not be used as a sole basis for treatment. Nasal washings and aspirates are unacceptable for Xpert Xpress SARS-CoV-2/FLU/RSV testing.  Fact Sheet for Patients: BloggerCourse.com  Fact Sheet for Healthcare Providers: SeriousBroker.it  This test is not yet approved or cleared by the [place] FDA and has been authorized for detection and/or diagnosis of SARS-CoV-2 by FDA under an Emergency Use Authorization (EUA). This EUA will remain in effect (meaning this test can be used) for the duration of the COVID-19 declaration under Section 564(b)(1) of the Act, 21 U.S.C. section 360bbb-3(b)(1), unless the authorization is terminated or revoked.     Resp Syncytial Virus by PCR NEGATIVE NEGATIVE Final    Comment: (NOTE) Fact Sheet for Patients: BloggerCourse.com  Fact Sheet for Healthcare Providers: SeriousBroker.it  This test is not yet approved or cleared by the [place] FDA and has been authorized for detection and/or diagnosis of SARS-CoV-2 by FDA under an Emergency Use Authorization (EUA). This EUA will remain in effect (meaning this test can be used) for the duration of the COVID-19 declaration under Section 564(b)(1) of the Act, 21 U.S.C. section 360bbb-3(b)(1), unless the authorization is terminated or revoked.  Performed at [facility], [address] [place], [place] [address]    MRSA Next Gen by PCR, Nasal     Status: None   Collection Time: 06/25/23  8:34 AM   Specimen: Nasal Mucosa; Nasal Swab  Result Value Ref Range Status   MRSA by PCR Next Gen NOT DETECTED NOT DETECTED Final    Comment: (NOTE) The GeneXpert MRSA Assay (FDA approved for NASAL specimens only), is one component of a comprehensive MRSA colonization surveillance program. It is not intended to diagnose MRSA infection nor to guide or monitor treatment for MRSA infections. Test performance is not FDA approved in patients less than 69 years old. Performed at [facility], [address] [place], [place] [address]   Respiratory (~20 pathogens) panel by PCR     Status: None   Collection Time: 06/25/23  9:24 AM   Specimen: Nasopharyngeal Swab; Respiratory  Result Value Ref Range Status   Adenovirus NOT DETECTED NOT DETECTED Final   Coronavirus 229E NOT DETECTED NOT DETECTED Final    Comment: (NOTE) The Coronavirus on the Respiratory Panel, DOES NOT test for the novel  Coronavirus (2019 nCoV)    Coronavirus HKU1 NOT DETECTED NOT DETECTED Final   Coronavirus NL63 NOT DETECTED NOT DETECTED Final   Coronavirus OC43 NOT DETECTED NOT DETECTED Final   Metapneumovirus  NOT DETECTED NOT DETECTED Final   Rhinovirus / Enterovirus NOT DETECTED NOT DETECTED Final   Influenza A NOT DETECTED NOT DETECTED Final   Influenza B NOT DETECTED NOT DETECTED Final   Parainfluenza Virus 1 NOT DETECTED NOT DETECTED Final   Parainfluenza Virus 2 NOT DETECTED NOT DETECTED Final   Parainfluenza Virus 3 NOT DETECTED NOT DETECTED Final   Parainfluenza Virus 4 NOT DETECTED NOT DETECTED Final   Respiratory Syncytial Virus NOT DETECTED NOT DETECTED Final   Bordetella pertussis NOT DETECTED NOT DETECTED Final   Bordetella Parapertussis NOT DETECTED NOT DETECTED Final   Chlamydophila pneumoniae NOT DETECTED NOT DETECTED Final   Mycoplasma pneumoniae NOT DETECTED NOT DETECTED Final    Comment: Performed at [facility], [address], [place], [place] [address]  Blood Culture (routine x 2)     Status: None (Preliminary result)   Collection Time: 06/29/23  7:48 AM   Specimen: BLOOD  Result Value Ref Range Status   Specimen Description BLOOD BLOOD RIGHT FOREARM  Final   Special Requests   Final    BOTTLES DRAWN AEROBIC AND ANAEROBIC Blood Culture results may not be optimal due to an inadequate volume of blood received in culture bottles   Culture   Final    NO GROWTH 3 DAYS Performed at [facility], [address], [place], [place] [address]    Report Status PENDING  Incomplete  Blood Culture (routine x 2)     Status: None (Preliminary result)   Collection Time: 06/29/23  7:48 AM   Specimen: BLOOD  Result Value Ref Range Status   Specimen Description BLOOD RIGHT ANTECUBITAL  Final   Special Requests   Final    BOTTLES DRAWN AEROBIC AND ANAEROBIC Blood Culture results may not be optimal due to an inadequate volume of blood received in culture bottles   Culture   Final    NO GROWTH 3 DAYS Performed at [facility], [address] [place], [place] [address]    Report Status PENDING  Incomplete  Body fluid culture w Gram Stain     Status: None (Preliminary result)   Collection Time: 06/30/23  4:33 PM   Specimen: PATH Cytology Pleural fluid  Result Value Ref Range Status   Specimen Description   Final    PLEURAL Performed at [facility], [address] [place], [place] [address]    Special Requests   Final    PLEURAL Performed at [facility], [address] [place], [place] [address]    Gram Stain   Final    WBC PRESENT, PREDOMINANTLY MONONUCLEAR NO ORGANISMS SEEN CYTOSPIN SMEAR    Culture   Final    NO GROWTH 2 DAYS Performed at [facility], [address] [place], [place] [address]    Report Status PENDING  Incomplete  MRSA Next Gen by PCR, Nasal     Status: None   Collection Time: 07/01/23 11:35 AM   Specimen: Nasal  Mucosa; Nasal Swab  Result Value Ref Range Status   MRSA by PCR Next Gen NOT DETECTED NOT DETECTED Final    Comment: (NOTE) The GeneXpert MRSA Assay (FDA approved for NASAL specimens only), is one component of a comprehensive MRSA colonization surveillance program. It is not intended to diagnose MRSA infection nor to guide or monitor treatment for MRSA infections. Test performance is not FDA approved in patients less than 59 years old. Performed at [facility], [address]  [place], [place] [address]       Radiology Studies last 3 days: US THORACENTESIS ASP PLEURAL SPACE W/IMG GUIDE Result Date: 06/30/2023 INDICATION: Patient admitted with respiratory failure presents today with pleural effusions. Interventional radiology asked to perform a diagnostic and therapeutic thoracentesis. EXAM: ULTRASOUND GUIDED THORACENTESIS MEDICATIONS: 1% lidocaine 10 mL COMPLICATIONS: None immediate. PROCEDURE: An ultrasound guided thoracentesis was thoroughly discussed with the patient and questions answered. The benefits, risks, alternatives and complications were also discussed. The patient understands and wishes to proceed with the procedure. Written consent was obtained. Ultrasound was performed to localize and mark an adequate pocket of fluid in the right chest. The area was then prepped and draped in the normal sterile fashion. 1% Lidocaine was used for local anesthesia. Under ultrasound guidance a 6 Fr Safe-T-Centesis catheter was introduced. Thoracentesis was performed. The catheter was removed and a dressing applied. FINDINGS: A total of approximately 300 mL of clear yellow fluid was removed. Samples were sent to the laboratory as requested by the clinical team. IMPRESSION: Successful ultrasound guided diagnostic and therapeutic RIGHT thoracentesis yielding 300 mL of pleural fluid. Procedure performed by Alwyn Ren NP Electronically Signed   By: Roanna Banning M.D.   On: 06/30/2023 17:03    DG Chest Port 1 View Result Date: 06/30/2023 CLINICAL DATA:  Pleural effusion, post thoracentesis EXAM: PORTABLE CHEST - 1 VIEW COMPARISON:  06/29/2023 FINDINGS: No pneumothorax. Persistent blunting of the left lateral costophrenic angle. Airspace opacities in the mid and lower left lung as before. Improvement of right lung interstitial opacities. Heart size and mediastinal contours are within normal limits. Aortic Atherosclerosis (ICD10-170.0). Visualized bones unremarkable. IMPRESSION: 1. No pneumothorax. 2. Persistent left pleural effusion and left lung airspace disease. Electronically Signed   By: Corlis Leak M.D.   On: 06/30/2023 16:53   DG Chest Port 1 View Result Date: 06/29/2023 CLINICAL DATA:  Questionable sepsis - evaluate for abnormality EXAM: PORTABLE CHEST 1 VIEW COMPARISON:  CT chest 06/24/2023, chest x-ray 06/26/2023 FINDINGS: The heart and mediastinal contours are within normal limits. Atherosclerotic plaque. Slight interval worsening of patchy airspace opacities most prominent along the left upper lobe. Question interstitial markings with no overt pulmonary edema. Flattening of the hemidiaphragms with blunting of the costophrenic angles and likely trace pleural effusions. No pneumothorax. No acute osseous abnormality. IMPRESSION: 1. Slight interval worsening of patchy airspace opacities most prominent along the left upper lobe. Findings suggest infection. Followup PA and lateral chest X-ray is recommended in 3-4 weeks following therapy to ensure resolution. 2. Bilateral trace pleural effusions. 3. Aortic Atherosclerosis (ICD10-I70.0) and Emphysema (ICD10-J43.9). Electronically Signed   By: Tish Frederickson M.D.   On: 06/29/2023 08:05       Time spent: 50 min    Sunnie Nielsen, DO [facility] 07/02/2023, 4:30 PM    Dictation software may have been used to generate the above note. Typos may occur and escape review in typed/dictated notes. Please contact Dr Lyn Hollingshead  directly for clarity if needed.  Staff may message me via secure chat in Epic  but this may not receive an immediate response,  please page me for urgent matters!  If 7PM-7AM, please contact night coverage [email or web address]

## 2023-07-02 NOTE — Progress Notes (Signed)
PULMONOLOGY         Date: 07/02/2023,   MRN# [id number] Taylor Chambers Feb 04, 1946     AdmissionWeight: 50 kg                 CurrentWeight: 50 kg   CHIEF COMPLAINT:   Recurrent hospitalization with hypoxemia and advanced COPD   HISTORY OF PRESENT ILLNESS   This is a pleasant 78 yo F with a history of COPD chronic hypoxemia, CAD, atrial fibrillation, HFpEF, mitral insufficiency, hyperlipidemia, hypertension, tobacco abuse she was admitted on 06/24/23 with AFRVR and AECOPD with hypoxemia requiring critical care unit stay on BIPAP. She was also empirically treated for CAP due to infiltrate on CT chest. She did improve post treatment however after coming home husband reports severe dyspnea with notable tripoding and labored breathing with immediate return to ED.  On admission she has leukocytosis on CBC, elevated cardiac biomarkers with BNP >1600   07/01/23- s/p thoracentesis with 300cc yield, now on 2L/min. Seems improved clinically.  More forgetful then previously noted in clinic, today she said my name and recognized me during interview but after examination she started to introduce herself to me.  She may be developing component of dementia.  She does not appear septic and is in good spirits. Have added additional diuresis with torsemide and aldactone.  She has urinary collection system and is s/p lasix yesterday with good UOP.  Pleural fluid profile is transudate with slightly raised LDH due to diuresis.  This suggests absence of parapneumonic process. She did well with metaneb yesterday.    07/02/23- patient was seen at bedside.  Husband present reports he received letter regarding discharge and he's confused about it.  Ive asked him to review with case manager.  She had ABG with met alkalosis likely due to cotnraction post diuresis.  Clinicaly she appears better specifically reports moderate left sided rib pain that has not resolved and she's very pleased.  Ive reduced her abx to PO  bactrim to reduce fluid infusion.  She's smiling today.  We ordred CXR today.   PAST MEDICAL HISTORY   Past Medical History:  Diagnosis Date   Allergies    Anxiety    Aortic atherosclerosis (HCC)    Bipolar affective disorder (HCC)    COPD (chronic obstructive pulmonary disease) (HCC)    Coronary artery calcification seen on CT scan    Depression    Diastolic dysfunction 02/24/2020   a.) TTE 02/24/2020: EF >55%; triv PR, mild TR, mod MR; G1DD.   DOE (dyspnea on exertion)    Emphysema lung (HCC)    History of cataract    HLD (hyperlipidemia)    Hypertension    Macular degeneration    Migraines    Osteoporosis    Pneumonia    Tobacco use      SURGICAL HISTORY   Past Surgical History:  Procedure Laterality Date   CATARACT EXTRACTION     COLONOSCOPY N/A 08/30/2021   Procedure: COLONOSCOPY;  Surgeon: Jaynie Collins, DO;  Location: [facility];  Service: Gastroenterology;  Laterality: N/A;   DILATION AND CURETTAGE OF UTERUS     TEE WITHOUT CARDIOVERSION N/A 04/20/2023   Procedure: TRANSESOPHAGEAL ECHOCARDIOGRAM;  Surgeon: Antonieta Iba, MD;  Location: [facility];  Service: Cardiovascular;  Laterality: N/A;   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   Procedure: TOTAL HIP ARTHROPLASTY;  Surgeon: Donato Heinz, MD;  Location: [facility] OR[facility];  Service: Orthopedics;  Laterality: Right;   WISDOM TOOTH EXTRACTION  WRIST GANGLION EXCISION       FAMILY HISTORY   Family History  Problem Relation Age of Onset   Breast cancer Mother 63   Heart Problems Maternal Uncle    Heart Problems Paternal Aunt    Heart Problems Paternal Uncle      SOCIAL HISTORY   Social History   Tobacco Use   Smoking status: Former    Current packs/day: 0.00    Average packs/day: 0.3 packs/day for 50.0 years (12.5 ttl pk-yrs)    Types: Cigarettes    Start date: 01/10/1972    Quit date: 01/09/2022    Years since quitting: 1.4   Smokeless tobacco: Never  Vaping Use   Vaping status: Never  Used  Substance Use Topics   Alcohol use: Never   Drug use: Never     MEDICATIONS    Home Medication:     Current Medication:  Current Facility-Administered Medications:    albuterol (PROVENTIL) (2.5 MG/3ML) 0.083% nebulizer solution 2.5 mg, 2.5 mg, Nebulization, Q2H PRN, Sunnie Nielsen, DO, 2.5 mg at 07/01/23 2100   amiodarone (PACERONE) tablet 400 mg, 400 mg, Oral, BID, 400 mg at 07/02/23 0905 **FOLLOWED BY** [START ON 07/04/2023] amiodarone (PACERONE) tablet 200 mg, 200 mg, Oral, BID **FOLLOWED BY** [START ON 07/11/2023] amiodarone (PACERONE) tablet 200 mg, 200 mg, Oral, Daily, Lyn Hollingshead, Natalie, DO   apixaban (ELIQUIS) tablet 5 mg, 5 mg, Oral, BID, Sunnie Nielsen, DO, 5 mg at 07/02/23 6578   feeding supplement (ENSURE ENLIVE / ENSURE PLUS) liquid 237 mL, 237 mL, Oral, BID BM, Sunnie Nielsen, DO, 237 mL at 07/02/23 1308   LORazepam (ATIVAN) injection 0.5 mg, 0.5 mg, Intravenous, Q6H PRN, Sunnie Nielsen, DO, 0.5 mg at 06/30/23 2130   metoprolol tartrate (LOPRESSOR) injection 10 mg, 10 mg, Intravenous, Q8H, Sunnie Nielsen, DO, 10 mg at 07/02/23 1308   multivitamin with minerals tablet 1 tablet, 1 tablet, Oral, Daily, Sunnie Nielsen, DO, 1 tablet at 07/02/23 0904   pantoprazole (PROTONIX) EC tablet 40 mg, 40 mg, Oral, Daily, Sunnie Nielsen, DO, 40 mg at 07/02/23 4696   predniSONE (DELTASONE) tablet 20 mg, 20 mg, Oral, Q breakfast, Vida Rigger, MD, 20 mg at 07/02/23 2952   sertraline (ZOLOFT) tablet 100 mg, 100 mg, Oral, Daily, Sunnie Nielsen, DO, 100 mg at 07/02/23 8413   sodium chloride flush (NS) 0.9 % injection 3 mL, 3 mL, Intravenous, PRN, Sunnie Nielsen, DO   sulfamethoxazole-trimethoprim (BACTRIM) 400-80 MG per tablet 1 tablet, 1 tablet, Oral, Q12H, Vida Rigger, MD, 1 tablet at 07/02/23 0910   umeclidinium-vilanterol (ANORO ELLIPTA) 62.5-25 MCG/ACT 1 puff, 1 puff, Inhalation, Daily, Sunnie Nielsen, DO, 1 puff at 07/02/23  0903    ALLERGIES   Bupropion     REVIEW OF SYSTEMS    Review of Systems:  Gen:  Denies  fever, sweats, chills weigh loss  HEENT: Denies blurred vision, double vision, ear pain, eye pain, hearing loss, nose bleeds, sore throat Cardiac:  No dizziness, chest pain or heaviness, chest tightness,edema Resp:   reports dyspnea chronically  Gi: Denies swallowing difficulty, stomach pain, nausea or vomiting, diarrhea, constipation, bowel incontinence Gu:  Denies bladder incontinence, burning urine Ext:   Denies Joint pain, stiffness or swelling Skin: Denies  skin rash, easy bruising or bleeding or hives Endoc:  Denies polyuria, polydipsia , polyphagia or weight change Psych:   Denies depression, insomnia or hallucinations   Other:  All other systems negative   VS: BP (!) 120/56 (BP Location: Left Arm)   Pulse 80  Temp 97.8 F (36.6 C)   Resp 20   Ht 5\' 2"  (1.575 m)   Wt 50 kg   SpO2 96%   BMI 20.16 kg/m      PHYSICAL EXAM    GENERAL:NAD, no fevers, chills, no weakness no fatigue HEAD: Normocephalic, atraumatic.  EYES: Pupils equal, round, reactive to light. Extraocular muscles intact. No scleral icterus.  MOUTH: Moist mucosal membrane. Dentition intact. No abscess noted.  EAR, NOSE, THROAT: Clear without exudates. No external lesions.  NECK: Supple. No thyromegaly. No nodules. No JVD.  PULMONARY: decreased breath sounds with mild rhonchi worse at bases bilaterally.  CARDIOVASCULAR: S1 and S2. Regular rate and rhythm. No murmurs, rubs, or gallops. No edema. Pedal pulses 2+ bilaterally.  GASTROINTESTINAL: Soft, nontender, nondistended. No masses. Positive bowel sounds. No hepatosplenomegaly.  MUSCULOSKELETAL: No swelling, clubbing, or edema. Range of motion full in all extremities.  NEUROLOGIC: Cranial nerves II through XII are intact. No gross focal neurological deficits. Sensation intact. Reflexes intact.  SKIN: No ulceration, lesions, rashes, or cyanosis. Skin warm  and dry. Turgor intact.  PSYCHIATRIC: Mood, affect within normal limits. The patient is awake, alert and oriented x 3. Insight, judgment intact.       IMAGING        ASSESSMENT/PLAN      Acute on chronic hypoxemic respiratory failure   Baseline 2L/min   - this is due to combination of acute on chronic COPD but mostly CHF ecacerbation due to objective evidence of fluid overload and cardiac dysfunction  -bactrim PO for possible cap   2. Acute decompensated diastolic CHF eith EF >55% -interstitial edema with right moderate pleural effusion   - elevated cardiac biomarkerts   1,489.4 High  1,525.9 High  CM  1,629.4 High  CM 1,169.8 High  CM 94.0 CM 63.0   Comment: Performed at [facility], [address] [place], [place] [address]  She has no ankle edema but does have abominal edema and JVD elevation - will order thoracentesis and diuretics to help compressive atelectasis by draining right pleural space --paused his diuretecs 07/02/23   3. Compressive atelectasis  - metaneb BID with RT -incentive spirometry  -flutter valve     4. Altered mental status with encephalopathy    - no metabolic derrangements  -blood cultures negative x 24h does not seem septic - possibly due to insomnia and steroid therapy vs dementia         Thank you for allowing me to participate in the care of this patient.   Patient/Family are satisfied with care plan and all questions have been answered.    Provider disclosure: Patient with at least one acute or chronic illness or injury that poses a threat to life or bodily function and is being managed actively during this encounter.  All of the below services have been performed independently by signing provider:  review of prior documentation from internal and or external health records.  Review of previous and current lab results.  Interview and comprehensive assessment during patient visit today. Review of current and previous chest  radiographs/CT scans. Discussion of management and test interpretation with health care team and patient/family.   This document was prepared using Dragon voice recognition software and may include unintentional dictation errors.     Vida Rigger, M.D.  Division of Pulmonary & Critical Care Medicine

## 2023-07-03 DIAGNOSIS — J9621 Acute and chronic respiratory failure with hypoxia: Secondary | ICD-10-CM | POA: Diagnosis not present

## 2023-07-03 DIAGNOSIS — J9622 Acute and chronic respiratory failure with hypercapnia: Secondary | ICD-10-CM | POA: Diagnosis not present

## 2023-07-03 LAB — BASIC METABOLIC PANEL
Anion gap: 10 (ref 5–15)
Anion gap: 12 (ref 5–15)
BUN: 21 mg/dL (ref 8–23)
BUN: 20 mg/dL (ref 8–23)
CO2: 39 mmol/L — ABNORMAL HIGH (ref 22–32)
CO2: 34 mmol/L — ABNORMAL HIGH (ref 22–32)
Calcium: 8.1 mg/dL — ABNORMAL LOW (ref 8.9–10.3)
Calcium: 8.3 mg/dL — ABNORMAL LOW (ref 8.9–10.3)
Chloride: 92 mmol/L — ABNORMAL LOW (ref 98–111)
Chloride: 92 mmol/L — ABNORMAL LOW (ref 98–111)
Creatinine, Ser: 0.65 mg/dL (ref 0.44–1.00)
Creatinine, Ser: 0.71 mg/dL (ref 0.44–1.00)
GFR, Estimated: >60 mL/min (ref >60)
GFR, Estimated: >60 mL/min (ref >60)
Glucose, Bld: 91 mg/dL (ref 70–99)
Glucose, Bld: 208 mg/dL — ABNORMAL HIGH (ref 70–99)
Potassium: 2.7 mmol/L — CL (ref 3.5–5.1)
Potassium: 3.4 mmol/L — ABNORMAL LOW (ref 3.5–5.1)
Sodium: 141 mmol/L (ref 135–145)
Sodium: 138 mmol/L (ref 135–145)

## 2023-07-03 LAB — CBC
HCT: 39.8 % (ref 36.0–46.0)
Hemoglobin: 13.5 g/dL (ref 12.0–15.0)
MCH: 34.6 pg — ABNORMAL HIGH (ref 26.0–34.0)
MCHC: 33.9 g/dL (ref 30.0–36.0)
MCV: 102.1 fL — ABNORMAL HIGH (ref 80.0–100.0)
Platelets: 235 10*3/uL (ref 150–400)
RBC: 3.9 MIL/uL (ref 3.87–5.11)
RDW: 14.5 % (ref 11.5–15.5)
WBC: 20.3 10*3/uL — ABNORMAL HIGH (ref 4.0–10.5)
nRBC: 0 % (ref 0.0–0.2)

## 2023-07-03 MED ORDER — METOPROLOL SUCCINATE ER 50 MG PO TB24: 25.0000 mg | ORAL_TABLET | Freq: Every day | ORAL | 3 refills | Status: DC

## 2023-07-03 MED ORDER — POTASSIUM CHLORIDE 20 MEQ PO PACK
20.0000 meq | PACK | Freq: Once | ORAL | Status: AC
  Administered 2023-07-03: 20 meq via ORAL
  Filled 2023-07-03: qty 1

## 2023-07-03 MED ORDER — POTASSIUM CHLORIDE 10 MEQ/100ML IV SOLN
10.0000 meq | INTRAVENOUS | Status: DC
  Administered 2023-07-03 (×2): 10 meq via INTRAVENOUS
  Filled 2023-07-03 (×4): qty 100

## 2023-07-03 MED ORDER — FUROSEMIDE 20 MG PO TABS: ORAL_TABLET | ORAL | 0 refills | Status: DC

## 2023-07-03 MED ORDER — POTASSIUM CHLORIDE 20 MEQ PO PACK
40.0000 meq | PACK | Freq: Once | ORAL | Status: AC
  Administered 2023-07-03: 40 meq via ORAL
  Filled 2023-07-03: qty 2

## 2023-07-03 MED ORDER — MAGNESIUM SULFATE 2 GM/50ML IV SOLN
2.0000 g | Freq: Once | INTRAVENOUS | Status: DC
  Filled 2023-07-03: qty 50

## 2023-07-03 MED ORDER — POTASSIUM CHLORIDE 20 MEQ PO PACK: PACK | ORAL | 0 refills | Status: DC

## 2023-07-03 MED ORDER — SERTRALINE HCL 100 MG PO TABS: 100.0000 mg | ORAL_TABLET | Freq: Every day | ORAL | 11 refills | Status: AC

## 2023-07-03 MED ORDER — PREDNISONE 5 MG PO TABS: ORAL_TABLET | ORAL | 0 refills | Status: AC

## 2023-07-03 NOTE — Progress Notes (Signed)
PULMONOLOGY         Date: 07/03/2023,   MRN# [id number] Taylor Chambers 02-17-1946     AdmissionWeight: 50 kg                 CurrentWeight: 50 kg   CHIEF COMPLAINT:   Recurrent hospitalization with hypoxemia and advanced COPD   HISTORY OF PRESENT ILLNESS   This is a pleasant 78 yo F with a history of COPD chronic hypoxemia, CAD, atrial fibrillation, HFpEF, mitral insufficiency, hyperlipidemia, hypertension, tobacco abuse she was admitted on 06/24/23 with AFRVR and AECOPD with hypoxemia requiring critical care unit stay on BIPAP. She was also empirically treated for CAP due to infiltrate on CT chest. She did improve post treatment however after coming home husband reports severe dyspnea with notable tripoding and labored breathing with immediate return to ED.  On admission she has leukocytosis on CBC, elevated cardiac biomarkers with BNP >1600   07/01/23- s/p thoracentesis with 300cc yield, now on 2L/min. Seems improved clinically.  More forgetful then previously noted in clinic, today she said my name and recognized me during interview but after examination she started to introduce herself to me.  She may be developing component of dementia.  She does not appear septic and is in good spirits. Have added additional diuresis with torsemide and aldactone.  She has urinary collection system and is s/p lasix yesterday with good UOP.  Pleural fluid profile is transudate with slightly raised LDH due to diuresis.  This suggests absence of parapneumonic process. She did well with metaneb yesterday.    07/02/23- patient was seen at bedside.  Husband present reports he received letter regarding discharge and he's confused about it.  Ive asked him to review with case manager.  She had ABG with met alkalosis likely due to cotnraction post diuresis.  Clinicaly she appears better specifically reports moderate left sided rib pain that has not resolved and she's very pleased.  Ive reduced her abx to PO  bactrim to reduce fluid infusion.  She's smiling today.  We ordred CXR today.   07/03/23- patient seen at bedside, she has improved markedly and feels close to baseline. I met with her family and have set up close outpatient follow up. She is cleared for dc home     PAST MEDICAL HISTORY   Past Medical History:  Diagnosis Date   Allergies    Anxiety    Aortic atherosclerosis (HCC)    Bipolar affective disorder (HCC)    COPD (chronic obstructive pulmonary disease) (HCC)    Coronary artery calcification seen on CT scan    Depression    Diastolic dysfunction 02/24/2020   a.) TTE 02/24/2020: EF >55%; triv PR, mild TR, mod MR; G1DD.   DOE (dyspnea on exertion)    Emphysema lung (HCC)    History of cataract    HLD (hyperlipidemia)    Hypertension    Macular degeneration    Migraines    Osteoporosis    Pneumonia    Tobacco use      SURGICAL HISTORY   Past Surgical History:  Procedure Laterality Date   CATARACT EXTRACTION     COLONOSCOPY N/A 08/30/2021   Procedure: COLONOSCOPY;  Surgeon: Jaynie Collins, DO;  Location: [facility] END[facility]COPY;  Service: Gastroenterology;  Laterality: N/A;   DILATION AND CURETTAGE OF UTERUS     TEE WITHOUT CARDIOVERSION N/A 04/20/2023   Procedure: TRANSESOPHAGEAL ECHOCARDIOGRAM;  Surgeon: Antonieta Iba, MD;  Location: [facility] OR[facility];  Service: Cardiovascular;  Laterality: N/A;   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   Procedure: TOTAL HIP ARTHROPLASTY;  Surgeon: Donato Heinz, MD;  Location: [facility] OR[facility];  Service: Orthopedics;  Laterality: Right;   WISDOM TOOTH EXTRACTION     WRIST GANGLION EXCISION       FAMILY HISTORY   Family History  Problem Relation Age of Onset   Breast cancer Mother 16   Heart Problems Maternal Uncle    Heart Problems Paternal Aunt    Heart Problems Paternal Uncle      SOCIAL HISTORY   Social History   Tobacco Use   Smoking status: Former    Current packs/day: 0.00    Average packs/day: 0.3 packs/day for 50.0  years (12.5 ttl pk-yrs)    Types: Cigarettes    Start date: 01/10/1972    Quit date: 01/09/2022    Years since quitting: 1.4   Smokeless tobacco: Never  Vaping Use   Vaping status: Never Used  Substance Use Topics   Alcohol use: Never   Drug use: Never     MEDICATIONS    Home Medication:     Current Medication:  Current Facility-Administered Medications:    albuterol (PROVENTIL) (2.5 MG/3ML) 0.083% nebulizer solution 2.5 mg, 2.5 mg, Nebulization, Q2H PRN, Sunnie Nielsen, DO, 2.5 mg at 07/03/23 0801   amiodarone (PACERONE) tablet 400 mg, 400 mg, Oral, BID, 400 mg at 07/02/23 2130 **FOLLOWED BY** [START ON 07/04/2023] amiodarone (PACERONE) tablet 200 mg, 200 mg, Oral, BID **FOLLOWED BY** [START ON 07/11/2023] amiodarone (PACERONE) tablet 200 mg, 200 mg, Oral, Daily, Lyn Hollingshead, Natalie, DO   apixaban Everlene Balls) tablet 5 mg, 5 mg, Oral, BID, Sunnie Nielsen, DO, 5 mg at 07/02/23 2130   feeding supplement (ENSURE ENLIVE / ENSURE PLUS) liquid 237 mL, 237 mL, Oral, BID BM, Sunnie Nielsen, DO, 237 mL at 07/02/23 1308   LORazepam (ATIVAN) injection 0.5 mg, 0.5 mg, Intravenous, Q6H PRN, Sunnie Nielsen, DO, 0.5 mg at 06/30/23 2130   metoprolol tartrate (LOPRESSOR) injection 10 mg, 10 mg, Intravenous, Q8H, Sunnie Nielsen, DO, 10 mg at 07/03/23 1610   multivitamin with minerals tablet 1 tablet, 1 tablet, Oral, Daily, Sunnie Nielsen, DO, 1 tablet at 07/02/23 0904   pantoprazole (PROTONIX) EC tablet 40 mg, 40 mg, Oral, Daily, Sunnie Nielsen, DO, 40 mg at 07/02/23 0905   potassium chloride (KLOR-CON) packet 40 mEq, 40 mEq, Oral, Once, Jawo, Modou L, NP   potassium chloride 10 mEq in 100 mL IVPB, 10 mEq, Intravenous, Q1 Hr x 4, Jawo, Modou L, NP   predniSONE (DELTASONE) tablet 20 mg, 20 mg, Oral, Q breakfast, Karna Christmas, Keni Wafer, MD, 20 mg at 07/02/23 9604   sertraline (ZOLOFT) tablet 100 mg, 100 mg, Oral, Daily, Sunnie Nielsen, DO, 100 mg at 07/02/23 5409   sodium chloride flush  (NS) 0.9 % injection 3 mL, 3 mL, Intravenous, PRN, Sunnie Nielsen, DO   sulfamethoxazole-trimethoprim (BACTRIM) 400-80 MG per tablet 1 tablet, 1 tablet, Oral, Q12H, Vida Rigger, MD, 1 tablet at 07/02/23 2130   umeclidinium-vilanterol (ANORO ELLIPTA) 62.5-25 MCG/ACT 1 puff, 1 puff, Inhalation, Daily, Sunnie Nielsen, DO, 1 puff at 07/02/23 0903    ALLERGIES   Bupropion     REVIEW OF SYSTEMS    Review of Systems:  Gen:  Denies  fever, sweats, chills weigh loss  HEENT: Denies blurred vision, double vision, ear pain, eye pain, hearing loss, nose bleeds, sore throat Cardiac:  No dizziness, chest pain or heaviness, chest tightness,edema Resp:   reports dyspnea chronically  Gi: Denies swallowing  difficulty, stomach pain, nausea or vomiting, diarrhea, constipation, bowel incontinence Gu:  Denies bladder incontinence, burning urine Ext:   Denies Joint pain, stiffness or swelling Skin: Denies  skin rash, easy bruising or bleeding or hives Endoc:  Denies polyuria, polydipsia , polyphagia or weight change Psych:   Denies depression, insomnia or hallucinations   Other:  All other systems negative   VS: BP 95/67 (BP Location: Right Arm)   Pulse 64   Temp 98.2 F (36.8 C)   Resp 20   Ht 5\' 2"  (1.575 m)   Wt 50 kg   SpO2 96%   BMI 20.16 kg/m      PHYSICAL EXAM    GENERAL:NAD, no fevers, chills, no weakness no fatigue HEAD: Normocephalic, atraumatic.  EYES: Pupils equal, round, reactive to light. Extraocular muscles intact. No scleral icterus.  MOUTH: Moist mucosal membrane. Dentition intact. No abscess noted.  EAR, NOSE, THROAT: Clear without exudates. No external lesions.  NECK: Supple. No thyromegaly. No nodules. No JVD.  PULMONARY: decreased breath sounds with mild rhonchi worse at bases bilaterally.  CARDIOVASCULAR: S1 and S2. Regular rate and rhythm. No murmurs, rubs, or gallops. No edema. Pedal pulses 2+ bilaterally.  GASTROINTESTINAL: Soft, nontender,  nondistended. No masses. Positive bowel sounds. No hepatosplenomegaly.  MUSCULOSKELETAL: No swelling, clubbing, or edema. Range of motion full in all extremities.  NEUROLOGIC: Cranial nerves II through XII are intact. No gross focal neurological deficits. Sensation intact. Reflexes intact.  SKIN: No ulceration, lesions, rashes, or cyanosis. Skin warm and dry. Turgor intact.  PSYCHIATRIC: Mood, affect within normal limits. The patient is awake, alert and oriented x 3. Insight, judgment intact.       IMAGING        ASSESSMENT/PLAN      Acute on chronic hypoxemic respiratory failure   Baseline 2L/min   - this is due to combination of acute on chronic COPD but mostly CHF ecacerbation due to objective evidence of fluid overload and cardiac dysfunction  -bactrim PO for possible cap   2. Acute decompensated diastolic CHF eith EF >55% -interstitial edema with right moderate pleural effusion   - elevated cardiac biomarkerts   1,489.4 High  1,525.9 High  CM  1,629.4 High  CM 1,169.8 High  CM 94.0 CM 63.0   Comment: Performed at [facility], [address] [place], [place] [address]  She has no ankle edema but does have abominal edema and JVD elevation - will order thoracentesis and diuretics to help compressive atelectasis by draining right pleural space --paused his diuretecs 07/02/23   3. Compressive atelectasis  - metaneb BID with RT -incentive spirometry  -flutter valve     4. Altered mental status with encephalopathy    - no metabolic derrangements  -blood cultures negative x 24h does not seem septic - possibly due to insomnia and steroid therapy vs dementia         Thank you for allowing me to participate in the care of this patient.   Patient/Family are satisfied with care plan and all questions have been answered.    Provider disclosure: Patient with at least one acute or chronic illness or injury that poses a threat to life or bodily function and  is being managed actively during this encounter.  All of the below services have been performed independently by signing provider:  review of prior documentation from internal and or external health records.  Review of previous and current lab results.  Interview and comprehensive assessment during patient visit  today. Review of current and previous chest radiographs/CT scans. Discussion of management and test interpretation with health care team and patient/family.   This document was prepared using Dragon voice recognition software and may include unintentional dictation errors.     Vida Rigger, M.D.  Division of Pulmonary & Critical Care Medicine

## 2023-07-03 NOTE — Progress Notes (Signed)
Physical Therapy Treatment Patient Details Name: Taylor Chambers MRN: [id number] DOB: December 01, 1945 Today's Date: 07/03/2023   History of Present Illness Pt is a 78 y.o. female presenting to hospital 06/29/23 for SOB (recently discharged day prior after presenting with hypoxia, CHF, COPD exacerbation, PNA, and sepsis).  Pt admitted with acute on chronic respiratory failure with hypoxia and hypercapnia, COPD exacerbation, question HFpEF exacerbation, elevated troponin (suspect d/t demand/supply mismatch given respiratory distress), and paroxysmal a-fib.  PMH includes COPD on 2-3L home O2, OSA with CPAP, CAD, a-fib, HFpEF, mitral insufficiency, HLD, htn, tobacco abuse.    PT Comments  Pt resting in bed upon PT arrival; pt agreeable to therapy; family present.  No c/o pain during session.  Currently pt is SBA with bed mobility; CGA to min assist with transfers with RW use; and CGA to min assist to ambulate 20 feet with RW use.  Limited distance ambulating d/t SOB, fatigue, and generalized weakness; pt walking slowly and taking a couple short standing rest breaks d/t SOB.  Pt on 2 L O2 via nasal cannula during sessions activities.  SpO2 sats 94% or greater at rest beginning/end of session and 90% or greater with activity.  Pt requiring pacing and rest breaks during session.  Pt's bed/linen noted to be wet (purewick did not appear to be working)--nursing notified and came to assist with pt care (pt's bed linens, gown, and underwear changed).  Discussed pt's status and current PT recommendations.  Pt reporting plan to discharge home.  D/t this, educated pt and pt's family that pt currently requiring 24/7 physical assist with functional mobility for safety.  Also educated pt on pacing/activity modification: pt verbalizing appropriate understanding.  Pt's family reports they have a w/c pt can borrow to assist with getting pt into home (d/t longer distance to get into home) but pt/pt's family requesting pt have her own  manual w/c to assist with mobility (MD and TOC notified).   If plan is discharge home, recommend the following: A little help with walking and/or transfers;A little help with bathing/dressing/bathroom;Assistance with cooking/housework;Assist for transportation;Help with stairs or ramp for entrance   Can travel by private vehicle     Yes  Equipment Recommendations  Wheelchair (measurements PT);Wheelchair cushion (measurements PT);BSC/3in1    Recommendations for Other Services       Precautions / Restrictions Precautions Precautions: Fall Precaution Comments: Monitor SpO2 Restrictions Weight Bearing Restrictions Per Provider Order: No     Mobility  Bed Mobility Overal bed mobility: Needs Assistance Bed Mobility: Supine to Sit, Sit to Supine     Supine to sit: Supervision, HOB elevated Sit to supine: Supervision, HOB elevated   General bed mobility comments: mild increased time to perform on own (pt pacing herself d/t SOB)    Transfers Overall transfer level: Needs assistance Equipment used: Rolling walker (2 wheels) Transfers: Sit to/from Stand Sit to Stand: Contact guard assist, Min assist           General transfer comment: x3 trials standing from bed (CGA 1st trial; min assist 2nd and 3rd trial); vc's for UE placement    Ambulation/Gait Ambulation/Gait assistance: Contact guard assist, Min assist Gait Distance (Feet): 20 Feet Assistive device: Rolling walker (2 wheels) Gait Pattern/deviations: Step-through pattern, Decreased step length - right, Decreased step length - left Gait velocity: decreased     General Gait Details: intermittent unsteadiness requiring CGA to min assist for balance; limited distance ambulating d/t SOB, fatigue, and generalized weakness   Stairs  Wheelchair Mobility     Tilt Bed    Modified Rankin (Stroke Patients Only)       Balance Overall balance assessment: Needs assistance Sitting-balance support: No  upper extremity supported, Feet supported Sitting balance-Leahy Scale: Good Sitting balance - Comments: steady reaching within BOS   Standing balance support: Reliant on assistive device for balance, During functional activity, Bilateral upper extremity supported Standing balance-Leahy Scale: Poor Standing balance comment: intermittent unsteadiness requiring CGA to min assist for balance walking with RW                            Cognition Arousal: Alert Behavior During Therapy: WFL for tasks assessed/performed Overall Cognitive Status: Within Functional Limits for tasks assessed                                          Exercises      General Comments  Nursing cleared pt for participation in physical therapy.  Pt agreeable to PT session.  Pt's son and additional visitor present during session.      Pertinent Vitals/Pain Pain Assessment Pain Assessment: No/denies pain HR 73-85 bpm during sessions activities.    Home Living                          Prior Function            PT Goals (current goals can now be found in the care plan section) Acute Rehab PT Goals Patient Stated Goal: to improve breathing PT Goal Formulation: With patient Time For Goal Achievement: 07/14/23 Potential to Achieve Goals: Fair Progress towards PT goals: Progressing toward goals    Frequency    Min 1X/week      PT Plan      Co-evaluation              AM-PAC PT "6 Clicks" Mobility   Outcome Measure  Help needed turning from your back to your side while in a flat bed without using bedrails?: A Little Help needed moving from lying on your back to sitting on the side of a flat bed without using bedrails?: A Little Help needed moving to and from a bed to a chair (including a wheelchair)?: A Little Help needed standing up from a chair using your arms (e.g., wheelchair or bedside chair)?: A Little Help needed to walk in hospital room?: A  Little Help needed climbing 3-5 steps with a railing? : Total 6 Click Score: 16    End of Session Equipment Utilized During Treatment: Gait belt;Oxygen (2 L via nasal cannula) Activity Tolerance: Patient limited by fatigue;Other (comment) (Limited d/t SOB) Patient left: in bed;with call bell/phone within reach;with nursing/sitter in room;with family/visitor present;Other (comment) (nurse present assisting pt with pt care) Nurse Communication: Mobility status;Precautions;Other (comment) (bed alarm not setting; pt's SpO2 sats during session; pt needing clean-up (bed/linen wet)) PT Visit Diagnosis: Other abnormalities of gait and mobility (R26.89);Muscle weakness (generalized) (M62.81);History of falling (Z91.81)     Time: 1610-9604 PT Time Calculation (min) (ACUTE ONLY): 44 min  Charges:    $Gait Training: 8-22 mins $Therapeutic Activity: 23-37 mins PT General Charges $$ ACUTE PT VISIT: 1 Visit                     Hendricks Limes, PT 07/03/23, 12:30 PM

## 2023-07-03 NOTE — TOC Transition Note (Signed)
Transition of Care [facility]) - Discharge Note   Patient Details  Name: Taylor Chambers MRN: [id number] Date of Birth: [birth year]  Transition of Care [facility]) CM/SW Contact:  Truddie Hidden, RN Phone Number: 07/03/2023, 3:38 PM   Clinical Narrative:    Spoke with patient and her family at bedside regarding discharge plan. Patient does not want to go to SNF but is agreeable to [facility]. Patient does not have a choice of an agency. Patient advised the accepting agency will contact her directly to scheduled SOC within 48 post discharge. Referral for [facility] sent to [facility].  Patient's spouse will transport her home. Her home oxygen is at her bedside.   Spoke with Mitch from [employer] they will contact her directly to schedule an appointment to deliver her respiratory supplies and WC.   TOC signing off.            Patient Goals and CMS Choice            Discharge Placement                       Discharge Plan and Services Additional resources added to the After Visit Summary for                                       Social Drivers of Health (SDOH) Interventions SDOH Screenings   Food Insecurity: No Food Insecurity (07/01/2023)  Housing: Low Risk  (07/01/2023)  Transportation Needs: No Transportation Needs (07/01/2023)  Utilities: Not At Risk (07/01/2023)  Financial Resource Strain: Low Risk  (03/02/2023)   Received from [facility]  Social Connections: Moderately Isolated (07/01/2023)  Tobacco Use: Medium Risk (06/29/2023)     Readmission Risk Interventions     No data to display

## 2023-07-03 NOTE — Discharge Summary (Signed)
Physician Discharge Summary   Patient: Taylor Chambers MRN: [id number]  DOB: 11/27/1945   Admit:     Date of Admission: 06/29/2023 Admitted from: home health   Discharge: Date of discharge: 07/03/23 Disposition: Home health Condition at discharge: good  CODE STATUS: FULL CODE     Discharge Physician: Sunnie Nielsen, DO [facility]     PCP: Mick Sell, MD  Recommendations for Outpatient Follow-up:  Follow up with PCP Mick Sell, MD in 1-2 weeks Please obtain labs/tests: BMP, CBC in 1-2 weeks Follow up with pulmonary and cardiology in 2-4 weeks  May need repeat eval for OSA/BiPap  Cardiology to follow on HFpEF management   Discharge Instructions     (HEART FAILURE PATIENTS) Call MD:  Anytime you have any of the following symptoms: 1) 3 pound weight gain in 24 hours or 5 pounds in 1 week 2) shortness of breath, with or without a dry hacking cough 3) swelling in the hands, feet or stomach 4) if you have to sleep on extra pillows at night in order to breathe.   Complete by: As directed    Diet - low sodium heart healthy   Complete by: As directed    Increase activity slowly   Complete by: As directed          Discharge Diagnoses: Principal Problem:   Acute on chronic respiratory failure with hypoxia and hypercapnia [facility])       Hospital Course:  Hospital course / significant events:   HPI: Taylor Chambers is a 78 y.o. female with medical history significant of COPD on 2-3L home O2 [place] and OSA w/ CPAP qhs, CAD, atrial fibrillation, HFpEF w/ Grade II diastolic dysfunction, mitral insufficiency, hyperlipidemia, hypertension, tobacco abuse. She was admitted recently 06/24/23 and discharged 01/22 (day prior to this admission) following treatment for COPD exacerbation, Afib RVR, HFpEF exacerbation all resulting in acute hypercarbic respiratory failure on chronic hypoxic respiratory failure, requiring BiPap. 06/29/23 presented to ED from home via  EMS chief complaint SOB. She states 01/22 she felt fine when home from the hospital, slept okay overnight and compliant w/ CPAP and other meds, awoke and felt okay until relatively sudden worsening SOB when ambulating to the bathroom.   01/23: admitted for resp failure again requiring BiPap support. Tx for HCAP, COPD, and question component of HFpEF. Remained in BiPap thru evening and overnight.  01/24: in BiPap this morning then to [place] but still increased WOB, back on BiPap. Pulmonary consult - CHF seems more likely, initiate diuresis, also ordered for thoracentesis which yielded 300 cc. Palliative care following.  01/25: diuresing well  01/26: contraction alkalosis, holding diuretics. 2L [place] while awake, still on BiPap at bedtime 01/27: alkalosis improving, potassium improving, pt back on baseline O2, eager for discharge home - agreeable for close outpatient followup, she declines SNF      Consultants:  Pulmonology Palliative care   Procedures/Surgeries: 01/24 - R thoracentesis yield 300 cc       ASSESSMENT & PLAN:   Acute on chronic respiratory failure with hypoxia and hypercapnia Supplemental O2 BiPAP --> [place] Underlying causes as below - COPD / CHF    COPD w/ (+)SIRS d/t COPD exacerbation Hx CAP recent admission Question HCAP now, in which case (+)Severe Sepsis d/t HCAP but this ahs been ruled out  CXR not convincing for pneumonia but given leukocytosis w/ L shift and resp distress, recent hospitalization, there is concern for HCAP Bactrim per pulmonary  Duoneb + budesonide  nebs  SABA prn albuterol nebs S/p SoluMedrol, continue on prednisone   Supplemental O2 BiPap at bedtime / as needed   Compressive atelectasis  metaneb BID with RT incentive spirometry  flutter valve   HFpEF exacerbation  high BNP and pt was not sent home on diuretics last admission No LE edema, but BNP w/ CXR infiltrate and faint rales which would also support CHF diagnosis  Cautious diuresis - see  Lasix instructions for as-needed use - pt and family instructed on instructions and all questions answered Will not repeat Echo given recently done Beta blocker  Holding ACE/ARB and other GDMT d/t soft BP   Contraction alkalosis Holding diuresis for now   Pleural effusion S/p R Thoracentesis  Follow repeat CXR after thoracentesis --> follow outpatinet   CAD Elevated troponin  No chest pain. Suspect troponin d/t demand/supply mismatch given respiratory distress EKG for chest pain / as needed Telemetry  Continue Eliquis, beta blocker Holding statin d/t transaminitis can restart on discharge  Holding ACE/ARB and other GDMT d/t soft BP    Advanced care planning  Discussed code status - pt and family wish for full code Palliative care following    Transaminitis  ALT 214 on admission, 4-5 days ago was 60-80. Values normal 1 year prior. Question shock liver d/t hypoperfusion Monitor CMP --> improving   Paroxysmal Afib Metoprolol at lower dose (50 mg --> 25 mg) given soft BP Continue home amiodarone (400 mg bid for now, then 07/04/23 can start 200 mg bid if she is still in hospital, see cardiology note 01/22)  Continue home Eliquis    GERD Continue home PPI   Anxiety/Depression Continue home Zoloft Very anxious on BiPap --> ativan IV low dose prn         Discharge Instructions  Allergies as of 07/03/2023       Reactions   Bupropion Other (See Comments)   Foggy brain        Medication List     STOP taking these medications    ibuprofen 200 MG tablet Commonly known as: ADVIL       TAKE these medications    albuterol 108 (90 Base) MCG/ACT inhaler Commonly known as: VENTOLIN HFA Inhale 2 puffs into the lungs every 6 (six) hours as needed for wheezing or shortness of breath.   amiodarone 200 MG tablet Commonly known as: Pacerone Take 2 tabs twice a day to start. On 1/28 start taking 1 tab twice a day. On 2/4 start taking one tab once a day   apixaban 5  MG Tabs tablet Commonly known as: ELIQUIS Take 1 tablet (5 mg total) by mouth 2 (two) times daily.   CALCIUM 500 PO Take 500 mg by mouth daily at 12 noon.   Dupilumab 300 MG/2ML Soaj Inject 300 mg into the skin every 14 (fourteen) days. Saturday   EPINEPHrine 0.3 mg/0.3 mL Soaj injection Commonly known as: EPI-PEN Inject 0.3 mg into the muscle as needed for anaphylaxis.   furosemide 20 MG tablet Commonly known as: Lasix Take 1 tablet (20 mg total) by mouth ONCE or TWICE daily (total daily dose maximum 40 mg) as needed for up to 3 days for increased leg swelling, shortness of breath while lying down flat on back, weight gain 5+ lbs over 1-2 days. Seek medical care if these symptoms are not improving with increased dose.   ipratropium-albuterol 0.5-2.5 (3) MG/3ML Soln Commonly known as: DUONEB Take 3 mLs by nebulization 2 (two) times daily. Mix with  budesonide What changed: Another medication with the same name was removed. Continue taking this medication, and follow the directions you see here.   metoprolol succinate 50 MG 24 hr tablet Commonly known as: TOPROL-XL Take 0.5 tablets (25 mg total) by mouth daily. Take with or immediately following a meal. What changed:  how much to take when to take this   Ohtuvayre 3 MG/2.5ML Susp Generic drug: Ensifentrine Take 3 mg by nebulization daily.   omeprazole 40 MG capsule Commonly known as: PRILOSEC Take 40 mg by mouth daily as needed (Heartburn).   potassium chloride 20 MEQ packet Commonly known as: KLOR-CON Take 20 mEq by mouth daily on days Lasix is taken   pravastatin 10 MG tablet Commonly known as: PRAVACHOL Take 10 mg by mouth daily.   predniSONE 5 MG tablet Commonly known as: DELTASONE Take 4 tablets (20 mg total) by mouth daily with breakfast for 2 days, THEN 3 tablets (15 mg total) daily with breakfast for 2 days, THEN 2 tablets (10 mg total) daily with breakfast for 2 days, THEN 1 tablet (5 mg total) daily with  breakfast for 2 days. 4 tabs daily for 3 days then 3 tabs daily for 3 days then 2 tabs daily for 3 days then 1 tab daily for 3 days. Start taking on: July 03, 2023 What changed:  medication strength See the new instructions.   PRESERVISION AREDS 2+MULTI VIT PO Take 2 tablets by mouth daily.   sertraline 100 MG tablet Commonly known as: ZOLOFT Take 1 tablet (100 mg total) by mouth daily.   sulfamethoxazole-trimethoprim 400-80 MG tablet Commonly known as: BACTRIM Take 1 tablet by mouth 3 (three) times a week. Monday, Wednesday and Friday   Vitamin D3 1.25 MG (50000 UT) Caps Take 5,000 Units by mouth daily in the afternoon.               Durable Medical Equipment  (From admission, onward)           Start     Ordered   07/03/23 1301  For home use only DME standard manual wheelchair with seat cushion  Once       Comments: Patient suffers from CHF, COPD which impairs their ability to perform daily activities like bathing, dressing, feeding, grooming, and toileting in the home.  A cane, crutch, or walker will not resolve issue with performing activities of daily living. A wheelchair will allow patient to safely perform daily activities. Patient can safely propel the wheelchair in the home or has a caregiver who can provide assistance. Length of need Lifetime. Accessories: elevating leg rests (ELRs), wheel locks, extensions and anti-tippers.   07/03/23 1300              Allergies  Allergen Reactions   Bupropion Other (See Comments)    Foggy brain     Subjective: pt reports feeling better today and eager for discharge hme denies CP/SOB reports ambulating around room w/ help/walker.    Discharge Exam: BP 106/60 (BP Location: Left Arm)   Pulse 72   Temp 98.2 F (36.8 C)   Resp 20   Ht 5\' 2"  (1.575 m)   Wt 50 kg   SpO2 99%   BMI 20.16 kg/m  General: Pt is alert, awake, not in acute distress Cardiovascular: RRR, S1/S2 +, no rubs, no gallops Respiratory:  diminished breath sounds bilaterally  Abdominal: Soft, NT, ND, bowel sound Extremities: no edema, no cyanosis     The results of significant diagnostics  from this hospitalization (including imaging, microbiology, ancillary and laboratory) are listed below for reference.     Microbiology: Recent Results (from the past 240 hours)  Blood culture (routine x 2)     Status: None   Collection Time: 06/24/23  5:34 AM   Specimen: BLOOD  Result Value Ref Range Status   Specimen Description BLOOD BLOOD RIGHT ARM  Final   Special Requests   Final    BOTTLES DRAWN AEROBIC AND ANAEROBIC Blood Culture results may not be optimal due to an inadequate volume of blood received in culture bottles   Culture   Final    NO GROWTH 5 DAYS Performed at [facility], [address] [place], [place] [address]    Report Status 06/29/2023 FINAL  Final  Blood culture (routine x 2)     Status: None   Collection Time: 06/24/23  5:43 AM   Specimen: BLOOD  Result Value Ref Range Status   Specimen Description BLOOD BLOOD RIGHT FOREARM  Final   Special Requests   Final    BOTTLES DRAWN AEROBIC ONLY Blood Culture results may not be optimal due to an inadequate volume of blood received in culture bottles   Culture   Final    NO GROWTH 5 DAYS Performed at [facility], [address] [place], [place] [address]    Report Status 06/29/2023 FINAL  Final  Resp panel by RT-PCR (RSV, Flu A&B, Covid) Anterior Nasal Swab     Status: None   Collection Time: 06/24/23  5:43 AM   Specimen: Anterior Nasal Swab  Result Value Ref Range Status   SARS Coronavirus 2 by RT PCR NEGATIVE NEGATIVE Final    Comment: (NOTE) SARS-CoV-2 target nucleic acids are NOT DETECTED.  The SARS-CoV-2 RNA is generally detectable in upper respiratory specimens during the acute phase of infection. The lowest concentration of SARS-CoV-2 viral copies this assay can detect is 138 copies/mL. A negative result does not preclude  SARS-Cov-2 infection and should not be used as the sole basis for treatment or other patient management decisions. A negative result may occur with  improper specimen collection/handling, submission of specimen other than nasopharyngeal swab, presence of viral mutation(s) within the areas targeted by this assay, and inadequate number of viral copies(<138 copies/mL). A negative result must be combined with clinical observations, patient history, and epidemiological information. The expected result is Negative.  Fact Sheet for Patients:  BloggerCourse.com  Fact Sheet for Healthcare Providers:  SeriousBroker.it  This test is no t yet approved or cleared by the [place] FDA and  has been authorized for detection and/or diagnosis of SARS-CoV-2 by FDA under an Emergency Use Authorization (EUA). This EUA will remain  in effect (meaning this test can be used) for the duration of the COVID-19 declaration under Section 564(b)(1) of the Act, 21 U.S.C.section 360bbb-3(b)(1), unless the authorization is terminated  or revoked sooner.       Influenza A by PCR NEGATIVE NEGATIVE Final   Influenza B by PCR NEGATIVE NEGATIVE Final    Comment: (NOTE) The Xpert Xpress SARS-CoV-2/FLU/RSV plus assay is intended as an aid in the diagnosis of influenza from Nasopharyngeal swab specimens and should not be used as a sole basis for treatment. Nasal washings and aspirates are unacceptable for Xpert Xpress SARS-CoV-2/FLU/RSV testing.  Fact Sheet for Patients: BloggerCourse.com  Fact Sheet for Healthcare Providers: SeriousBroker.it  This test is not yet approved or cleared by the [place] FDA and has been authorized for detection and/or  diagnosis of SARS-CoV-2 by FDA under an Emergency Use Authorization (EUA). This EUA will remain in effect (meaning this test can be used) for the duration of  the COVID-19 declaration under Section 564(b)(1) of the Act, 21 U.S.C. section 360bbb-3(b)(1), unless the authorization is terminated or revoked.     Resp Syncytial Virus by PCR NEGATIVE NEGATIVE Final    Comment: (NOTE) Fact Sheet for Patients: BloggerCourse.com  Fact Sheet for Healthcare Providers: SeriousBroker.it  This test is not yet approved or cleared by the [place] FDA and has been authorized for detection and/or diagnosis of SARS-CoV-2 by FDA under an Emergency Use Authorization (EUA). This EUA will remain in effect (meaning this test can be used) for the duration of the COVID-19 declaration under Section 564(b)(1) of the Act, 21 U.S.C. section 360bbb-3(b)(1), unless the authorization is terminated or revoked.  Performed at [facility], [address] [place], [place] [address]   MRSA Next Gen by PCR, Nasal     Status: None   Collection Time: 06/25/23  8:34 AM   Specimen: Nasal Mucosa; Nasal Swab  Result Value Ref Range Status   MRSA by PCR Next Gen NOT DETECTED NOT DETECTED Final    Comment: (NOTE) The GeneXpert MRSA Assay (FDA approved for NASAL specimens only), is one component of a comprehensive MRSA colonization surveillance program. It is not intended to diagnose MRSA infection nor to guide or monitor treatment for MRSA infections. Test performance is not FDA approved in patients less than 64 years old. Performed at [facility], [address] [place], [place] [address]   Respiratory (~20 pathogens) panel by PCR     Status: None   Collection Time: 06/25/23  9:24 AM   Specimen: Nasopharyngeal Swab; Respiratory  Result Value Ref Range Status   Adenovirus NOT DETECTED NOT DETECTED Final   Coronavirus 229E NOT DETECTED NOT DETECTED Final    Comment: (NOTE) The Coronavirus on the Respiratory Panel, DOES NOT test for the novel  Coronavirus (2019 nCoV)    Coronavirus HKU1 NOT  DETECTED NOT DETECTED Final   Coronavirus NL63 NOT DETECTED NOT DETECTED Final   Coronavirus OC43 NOT DETECTED NOT DETECTED Final   Metapneumovirus NOT DETECTED NOT DETECTED Final   Rhinovirus / Enterovirus NOT DETECTED NOT DETECTED Final   Influenza A NOT DETECTED NOT DETECTED Final   Influenza B NOT DETECTED NOT DETECTED Final   Parainfluenza Virus 1 NOT DETECTED NOT DETECTED Final   Parainfluenza Virus 2 NOT DETECTED NOT DETECTED Final   Parainfluenza Virus 3 NOT DETECTED NOT DETECTED Final   Parainfluenza Virus 4 NOT DETECTED NOT DETECTED Final   Respiratory Syncytial Virus NOT DETECTED NOT DETECTED Final   Bordetella pertussis NOT DETECTED NOT DETECTED Final   Bordetella Parapertussis NOT DETECTED NOT DETECTED Final   Chlamydophila pneumoniae NOT DETECTED NOT DETECTED Final   Mycoplasma pneumoniae NOT DETECTED NOT DETECTED Final    Comment: Performed at [facility], [address] [place], [place] [address]  Blood Culture (routine x 2)     Status: None (Preliminary result)   Collection Time: 06/29/23  7:48 AM   Specimen: BLOOD  Result Value Ref Range Status   Specimen Description BLOOD BLOOD RIGHT FOREARM  Final   Special Requests   Final    BOTTLES DRAWN AEROBIC AND ANAEROBIC Blood Culture results may not be optimal due to an inadequate volume of blood received in culture bottles   Culture   Final    NO GROWTH 4 DAYS  Performed at [facility], [address] [place], [place] [address]    Report Status PENDING  Incomplete  Blood Culture (routine x 2)     Status: None (Preliminary result)   Collection Time: 06/29/23  7:48 AM   Specimen: BLOOD  Result Value Ref Range Status   Specimen Description BLOOD RIGHT ANTECUBITAL  Final   Special Requests   Final    BOTTLES DRAWN AEROBIC AND ANAEROBIC Blood Culture results may not be optimal due to an inadequate volume of blood received in culture bottles   Culture   Final    NO GROWTH 4 DAYS Performed at [facility], [address], [place], [place] [address]    Report Status PENDING  Incomplete  Body fluid culture w Gram Stain     Status: None (Preliminary result)   Collection Time: 06/30/23  4:33 PM   Specimen: PATH Cytology Pleural fluid  Result Value Ref Range Status   Specimen Description   Final    PLEURAL Performed at [facility], [address], [place], [place] [address]    Special Requests   Final    PLEURAL Performed at [facility], [address] [place], [place] [address]    Gram Stain   Final    WBC PRESENT, PREDOMINANTLY MONONUCLEAR NO ORGANISMS SEEN CYTOSPIN SMEAR    Culture   Final    NO GROWTH 3 DAYS Performed at [facility], [address], [place], [place] [address]    Report Status PENDING  Incomplete  MRSA Next Gen by PCR, Nasal     Status: None   Collection Time: 07/01/23 11:35 AM   Specimen: Nasal Mucosa; Nasal Swab  Result Value Ref Range Status   MRSA by PCR Next Gen NOT DETECTED NOT DETECTED Final    Comment: (NOTE) The GeneXpert MRSA Assay (FDA approved for NASAL specimens only), is one component of a comprehensive MRSA colonization surveillance program. It is not intended to diagnose MRSA infection nor to guide or monitor treatment for MRSA infections. Test performance is not FDA approved in patients less than 85 years old. Performed at [facility], [address] [place], [place] [address]      Labs: BNP (last 3 results) Recent Labs    06/26/23 0900 06/29/23 0748 06/30/23 0636  BNP 678.7* 1,525.9* 1,489.4*   Basic Metabolic Panel: Recent Labs  Lab 06/29/23 0748 06/30/23 0631 07/02/23 0302 07/03/23 0513 07/03/23 1308  NA 140 145 143 141 138  K 4.3 3.9 3.9 2.7* 3.4*  CL 103 108 93* 92* 92*  CO2 23 26 41* 39* 34*  GLUCOSE 188* 119* 125* 91 208*  BUN 32* 33* 31* 21 20  CREATININE 0.91 0.82 0.78 0.65 0.71  CALCIUM 8.1* 7.8* 8.4* 8.1* 8.3*   Liver Function Tests: Recent Labs  Lab  06/29/23 0748 06/30/23 0631 07/02/23 0302  AST 60* 38 29  ALT 214* 152* 97*  ALKPHOS 74 60 56  BILITOT 1.2 1.0 1.4*  PROT 6.7 5.7* 5.7*  ALBUMIN 3.4* 3.1* 3.1*   No results for input(s): "LIPASE", "AMYLASE" in the last 168 hours. No results for input(s): "AMMONIA" in the last 168 hours. CBC: Recent Labs  Lab 06/27/23 0414 06/29/23 0748 06/30/23 0631 07/02/23 0302 07/03/23 0513  WBC 19.8* 21.8* 18.5* 21.0* 20.3*  NEUTROABS  --  17.7*  --   --   --   HGB 12.0 14.0 12.5 13.3 13.5  HCT 36.9 46.0 39.5 39.9 39.8  MCV 103.7* 114.1* 107.3* 101.5* 102.1*  PLT 378 429* 329 280 235   Cardiac Enzymes: No results for input(s): "CKTOTAL", "CKMB", "CKMBINDEX", "TROPONINI" in the last 168 hours. BNP: Invalid input(s): "POCBNP" CBG: Recent Labs  Lab 06/27/23 1138 06/27/23 1658 06/27/23 2122 06/28/23 0810 06/28/23 1110  GLUCAP 142* 165* 167* 128* 137*   D-Dimer No results for input(s): "DDIMER" in the last 72 hours. Hgb A1c No results for input(s): "HGBA1C" in the last 72 hours. Lipid Profile No results for input(s): "CHOL", "HDL", "LDLCALC", "TRIG", "CHOLHDL", "LDLDIRECT" in the last 72 hours. Thyroid function studies No results for input(s): "TSH", "T4TOTAL", "T3FREE", "THYROIDAB" in the last 72 hours.  Invalid input(s): "FREET3" Anemia work up No results for input(s): "VITAMINB12", "FOLATE", "FERRITIN", "TIBC", "IRON", "RETICCTPCT" in the last 72 hours. Urinalysis    Component Value Date/Time   COLORURINE YELLOW (A) 06/29/2023 1448   APPEARANCEUR CLOUDY (A) 06/29/2023 1448   LABSPEC 1.029 06/29/2023 1448   PHURINE 5.0 06/29/2023 1448   GLUCOSEU NEGATIVE 06/29/2023 1448   HGBUR NEGATIVE 06/29/2023 1448   BILIRUBINUR NEGATIVE 06/29/2023 1448   KETONESUR NEGATIVE 06/29/2023 1448   PROTEINUR >=300 (A) 06/29/2023 1448   NITRITE NEGATIVE 06/29/2023 1448   LEUKOCYTESUR NEGATIVE 06/29/2023 1448   Sepsis Labs Recent Labs  Lab 06/29/23 0748 06/30/23 0631 07/02/23 0302  07/03/23 0513  WBC 21.8* 18.5* 21.0* 20.3*   Microbiology Recent Results (from the past 240 hours)  Blood culture (routine x 2)     Status: None   Collection Time: 06/24/23  5:34 AM   Specimen: BLOOD  Result Value Ref Range Status   Specimen Description BLOOD BLOOD RIGHT ARM  Final   Special Requests   Final    BOTTLES DRAWN AEROBIC AND ANAEROBIC Blood Culture results may not be optimal due to an inadequate volume of blood received in culture bottles   Culture   Final    NO GROWTH 5 DAYS Performed at [facility], [address] [place], [place] [address]    Report Status 06/29/2023 FINAL  Final  Blood culture (routine x 2)     Status: None   Collection Time: 06/24/23  5:43 AM   Specimen: BLOOD  Result Value Ref Range Status   Specimen Description BLOOD BLOOD RIGHT FOREARM  Final   Special Requests   Final    BOTTLES DRAWN AEROBIC ONLY Blood Culture results may not be optimal due to an inadequate volume of blood received in culture bottles   Culture   Final    NO GROWTH 5 DAYS Performed at [facility], [address] [place], [place] [address]    Report Status 06/29/2023 FINAL  Final  Resp panel by RT-PCR (RSV, Flu A&B, Covid) Anterior Nasal Swab     Status: None   Collection Time: 06/24/23  5:43 AM   Specimen: Anterior Nasal Swab  Result Value Ref Range Status   SARS Coronavirus 2 by RT PCR NEGATIVE NEGATIVE Final    Comment: (NOTE) SARS-CoV-2 target nucleic acids are NOT DETECTED.  The SARS-CoV-2 RNA is generally detectable in upper respiratory specimens during the acute phase of infection. The lowest concentration of SARS-CoV-2 viral copies this assay can detect is 138 copies/mL. A negative result does not preclude SARS-Cov-2 infection and should not be used as the sole basis for treatment or other patient management decisions. A negative result may occur with  improper specimen collection/handling, submission of specimen other than  nasopharyngeal swab, presence of viral mutation(s) within the areas targeted  by this assay, and inadequate number of viral copies(<138 copies/mL). A negative result must be combined with clinical observations, patient history, and epidemiological information. The expected result is Negative.  Fact Sheet for Patients:  BloggerCourse.com  Fact Sheet for Healthcare Providers:  SeriousBroker.it  This test is no t yet approved or cleared by the [place] FDA and  has been authorized for detection and/or diagnosis of SARS-CoV-2 by FDA under an Emergency Use Authorization (EUA). This EUA will remain  in effect (meaning this test can be used) for the duration of the COVID-19 declaration under Section 564(b)(1) of the Act, 21 U.S.C.section 360bbb-3(b)(1), unless the authorization is terminated  or revoked sooner.       Influenza A by PCR NEGATIVE NEGATIVE Final   Influenza B by PCR NEGATIVE NEGATIVE Final    Comment: (NOTE) The Xpert Xpress SARS-CoV-2/FLU/RSV plus assay is intended as an aid in the diagnosis of influenza from Nasopharyngeal swab specimens and should not be used as a sole basis for treatment. Nasal washings and aspirates are unacceptable for Xpert Xpress SARS-CoV-2/FLU/RSV testing.  Fact Sheet for Patients: BloggerCourse.com  Fact Sheet for Healthcare Providers: SeriousBroker.it  This test is not yet approved or cleared by the [place] FDA and has been authorized for detection and/or diagnosis of SARS-CoV-2 by FDA under an Emergency Use Authorization (EUA). This EUA will remain in effect (meaning this test can be used) for the duration of the COVID-19 declaration under Section 564(b)(1) of the Act, 21 U.S.C. section 360bbb-3(b)(1), unless the authorization is terminated or revoked.     Resp Syncytial Virus by PCR NEGATIVE NEGATIVE Final    Comment:  (NOTE) Fact Sheet for Patients: BloggerCourse.com  Fact Sheet for Healthcare Providers: SeriousBroker.it  This test is not yet approved or cleared by the [place] FDA and has been authorized for detection and/or diagnosis of SARS-CoV-2 by FDA under an Emergency Use Authorization (EUA). This EUA will remain in effect (meaning this test can be used) for the duration of the COVID-19 declaration under Section 564(b)(1) of the Act, 21 U.S.C. section 360bbb-3(b)(1), unless the authorization is terminated or revoked.  Performed at [facility], [address] [place], [place] [address]   MRSA Next Gen by PCR, Nasal     Status: None   Collection Time: 06/25/23  8:34 AM   Specimen: Nasal Mucosa; Nasal Swab  Result Value Ref Range Status   MRSA by PCR Next Gen NOT DETECTED NOT DETECTED Final    Comment: (NOTE) The GeneXpert MRSA Assay (FDA approved for NASAL specimens only), is one component of a comprehensive MRSA colonization surveillance program. It is not intended to diagnose MRSA infection nor to guide or monitor treatment for MRSA infections. Test performance is not FDA approved in patients less than 60 years old. Performed at [facility], [address] [place], [place] [address]   Respiratory (~20 pathogens) panel by PCR     Status: None   Collection Time: 06/25/23  9:24 AM   Specimen: Nasopharyngeal Swab; Respiratory  Result Value Ref Range Status   Adenovirus NOT DETECTED NOT DETECTED Final   Coronavirus 229E NOT DETECTED NOT DETECTED Final    Comment: (NOTE) The Coronavirus on the Respiratory Panel, DOES NOT test for the novel  Coronavirus (2019 nCoV)    Coronavirus HKU1 NOT DETECTED NOT DETECTED Final   Coronavirus NL63 NOT DETECTED NOT DETECTED Final   Coronavirus OC43 NOT DETECTED NOT DETECTED Final   Metapneumovirus NOT DETECTED NOT DETECTED Final  Rhinovirus / Enterovirus NOT DETECTED NOT  DETECTED Final   Influenza A NOT DETECTED NOT DETECTED Final   Influenza B NOT DETECTED NOT DETECTED Final   Parainfluenza Virus 1 NOT DETECTED NOT DETECTED Final   Parainfluenza Virus 2 NOT DETECTED NOT DETECTED Final   Parainfluenza Virus 3 NOT DETECTED NOT DETECTED Final   Parainfluenza Virus 4 NOT DETECTED NOT DETECTED Final   Respiratory Syncytial Virus NOT DETECTED NOT DETECTED Final   Bordetella pertussis NOT DETECTED NOT DETECTED Final   Bordetella Parapertussis NOT DETECTED NOT DETECTED Final   Chlamydophila pneumoniae NOT DETECTED NOT DETECTED Final   Mycoplasma pneumoniae NOT DETECTED NOT DETECTED Final    Comment: Performed at [facility], [address] [place], [place] [address]  Blood Culture (routine x 2)     Status: None (Preliminary result)   Collection Time: 06/29/23  7:48 AM   Specimen: BLOOD  Result Value Ref Range Status   Specimen Description BLOOD BLOOD RIGHT FOREARM  Final   Special Requests   Final    BOTTLES DRAWN AEROBIC AND ANAEROBIC Blood Culture results may not be optimal due to an inadequate volume of blood received in culture bottles   Culture   Final    NO GROWTH 4 DAYS Performed at [facility], [address] [place], [place] [address]    Report Status PENDING  Incomplete  Blood Culture (routine x 2)     Status: None (Preliminary result)   Collection Time: 06/29/23  7:48 AM   Specimen: BLOOD  Result Value Ref Range Status   Specimen Description BLOOD RIGHT ANTECUBITAL  Final   Special Requests   Final    BOTTLES DRAWN AEROBIC AND ANAEROBIC Blood Culture results may not be optimal due to an inadequate volume of blood received in culture bottles   Culture   Final    NO GROWTH 4 DAYS Performed at [facility], [address] [place], [place] [address]    Report Status PENDING  Incomplete  Body fluid culture w Gram Stain     Status: None (Preliminary result)   Collection Time: 06/30/23  4:33 PM   Specimen: PATH  Cytology Pleural fluid  Result Value Ref Range Status   Specimen Description   Final    PLEURAL Performed at [facility], [address], [place], [place] [address]    Special Requests   Final    PLEURAL Performed at [facility], [address] [place], [place] [address]    Gram Stain   Final    WBC PRESENT, PREDOMINANTLY MONONUCLEAR NO ORGANISMS SEEN CYTOSPIN SMEAR    Culture   Final    NO GROWTH 3 DAYS Performed at [facility], [address] [place], [place] [address]    Report Status PENDING  Incomplete  MRSA Next Gen by PCR, Nasal     Status: None   Collection Time: 07/01/23 11:35 AM   Specimen: Nasal Mucosa; Nasal Swab  Result Value Ref Range Status   MRSA by PCR Next Gen NOT DETECTED NOT DETECTED Final    Comment: (NOTE) The GeneXpert MRSA Assay (FDA approved for NASAL specimens only), is one component of a comprehensive MRSA colonization surveillance program. It is not intended to diagnose MRSA infection nor to guide or monitor treatment for MRSA infections. Test performance is not FDA approved in patients less than 53 years old. Performed at [facility], [address], [place], [place] [address]    Imaging  US THORACENTESIS ASP PLEURAL SPACE W/IMG GUIDE Result Date: 06/30/2023 INDICATION: Patient admitted with respiratory failure presents today with pleural effusions. Interventional radiology asked to perform a diagnostic and therapeutic thoracentesis. EXAM: ULTRASOUND GUIDED THORACENTESIS MEDICATIONS: 1% lidocaine 10 mL COMPLICATIONS: None immediate. PROCEDURE: An ultrasound guided thoracentesis was thoroughly discussed with the patient and questions answered. The benefits, risks, alternatives and complications were also discussed. The patient understands and wishes to proceed with the procedure. Written consent was obtained. Ultrasound was performed to localize and mark an adequate pocket of fluid in the right chest. The area  was then prepped and draped in the normal sterile fashion. 1% Lidocaine was used for local anesthesia. Under ultrasound guidance a 6 Fr Safe-T-Centesis catheter was introduced. Thoracentesis was performed. The catheter was removed and a dressing applied. FINDINGS: A total of approximately 300 mL of clear yellow fluid was removed. Samples were sent to the laboratory as requested by the clinical team. IMPRESSION: Successful ultrasound guided diagnostic and therapeutic RIGHT thoracentesis yielding 300 mL of pleural fluid. Procedure performed by Alwyn Ren NP Electronically Signed   By: Roanna Banning M.D.   On: 06/30/2023 17:03   DG Chest Port 1 View Result Date: 06/30/2023 CLINICAL DATA:  Pleural effusion, post thoracentesis EXAM: PORTABLE CHEST - 1 VIEW COMPARISON:  06/29/2023 FINDINGS: No pneumothorax. Persistent blunting of the left lateral costophrenic angle. Airspace opacities in the mid and lower left lung as before. Improvement of right lung interstitial opacities. Heart size and mediastinal contours are within normal limits. Aortic Atherosclerosis (ICD10-170.0). Visualized bones unremarkable. IMPRESSION: 1. No pneumothorax. 2. Persistent left pleural effusion and left lung airspace disease. Electronically Signed   By: Corlis Leak M.D.   On: 06/30/2023 16:53   DG Chest Port 1 View Result Date: 06/29/2023 CLINICAL DATA:  Questionable sepsis - evaluate for abnormality EXAM: PORTABLE CHEST 1 VIEW COMPARISON:  CT chest 06/24/2023, chest x-ray 06/26/2023 FINDINGS: The heart and mediastinal contours are within normal limits. Atherosclerotic plaque. Slight interval worsening of patchy airspace opacities most prominent along the left upper lobe. Question interstitial markings with no overt pulmonary edema. Flattening of the hemidiaphragms with blunting of the costophrenic angles and likely trace pleural effusions. No pneumothorax. No acute osseous abnormality. IMPRESSION: 1. Slight interval worsening of patchy  airspace opacities most prominent along the left upper lobe. Findings suggest infection. Followup PA and lateral chest X-ray is recommended in 3-4 weeks following therapy to ensure resolution. 2. Bilateral trace pleural effusions. 3. Aortic Atherosclerosis (ICD10-I70.0) and Emphysema (ICD10-J43.9). Electronically Signed   By: Tish Frederickson M.D.   On: 06/29/2023 08:05      Time coordinating discharge: over 30 minutes  SIGNED:  Sunnie Nielsen DO [facility]

## 2023-07-03 NOTE — Care Management Important Message (Signed)
Important Message  Patient Details  Name: Taylor Chambers MRN: [id number] Date of Birth: 1946/01/22   Important Message Given:  Yes - Medicare IM     Sherilyn Banker 07/03/2023, 11:32 AM

## 2023-07-04 LAB — BODY FLUID CULTURE W GRAM STAIN

## 2023-07-04 LAB — CULTURE, BLOOD (ROUTINE X 2)
Culture: NO GROWTH
Culture: NO GROWTH

## 2023-07-04 LAB — CYTOLOGY - NON PAP

## 2023-07-17 ENCOUNTER — Ambulatory Visit: Payer: Medicare HMO

## 2023-07-17 ENCOUNTER — Other Ambulatory Visit: Payer: Self-pay | Admitting: Infectious Diseases

## 2023-07-17 ENCOUNTER — Ambulatory Visit: Payer: Medicare HMO | Attending: Infectious Diseases

## 2023-07-17 DIAGNOSIS — J441 Chronic obstructive pulmonary disease with (acute) exacerbation: Secondary | ICD-10-CM | POA: Insufficient documentation

## 2023-07-17 DIAGNOSIS — J449 Chronic obstructive pulmonary disease, unspecified: Secondary | ICD-10-CM | POA: Insufficient documentation

## 2023-07-17 LAB — BLOOD GAS, ARTERIAL
Acid-base deficit: 3.6 mmol/L — ABNORMAL HIGH (ref 0.0–2.0)
Bicarbonate: 20.4 mmol/L (ref 20.0–28.0)
FIO2: 0.21 %
O2 Saturation: 89.2 %
Patient temperature: 37
pCO2 arterial: 33 mm[Hg] (ref 32–48)
pH, Arterial: 7.4 (ref 7.35–7.45)
pO2, Arterial: 56 mm[Hg] — ABNORMAL LOW (ref 83–108)

## 2023-07-18 ENCOUNTER — Other Ambulatory Visit: Payer: Self-pay | Admitting: Infectious Diseases

## 2023-07-18 DIAGNOSIS — R441 Visual hallucinations: Secondary | ICD-10-CM

## 2023-07-18 DIAGNOSIS — I429 Cardiomyopathy, unspecified: Secondary | ICD-10-CM

## 2023-07-18 DIAGNOSIS — J449 Chronic obstructive pulmonary disease, unspecified: Secondary | ICD-10-CM

## 2023-07-21 ENCOUNTER — Ambulatory Visit
Admission: RE | Admit: 2023-07-21 | Discharge: 2023-07-21 | Disposition: A | Discharge status: Home / Self Care | Payer: Medicare HMO | Source: Ambulatory Visit | Attending: Infectious Diseases | Admitting: Infectious Diseases

## 2023-07-21 DIAGNOSIS — R296 Repeated falls: Secondary | ICD-10-CM | POA: Insufficient documentation

## 2023-07-21 DIAGNOSIS — G3189 Other specified degenerative diseases of nervous system: Secondary | ICD-10-CM | POA: Insufficient documentation

## 2023-07-21 DIAGNOSIS — R441 Visual hallucinations: Secondary | ICD-10-CM | POA: Insufficient documentation

## 2023-07-21 DIAGNOSIS — J449 Chronic obstructive pulmonary disease, unspecified: Secondary | ICD-10-CM | POA: Diagnosis present

## 2023-07-21 DIAGNOSIS — I429 Cardiomyopathy, unspecified: Secondary | ICD-10-CM | POA: Diagnosis not present

## 2023-07-21 DIAGNOSIS — R42 Dizziness and giddiness: Secondary | ICD-10-CM | POA: Diagnosis not present

## 2023-07-21 DIAGNOSIS — I509 Heart failure, unspecified: Secondary | ICD-10-CM | POA: Diagnosis present

## 2023-08-17 NOTE — Progress Notes (Unsigned)
 Cardiology Clinic Note   Date: 08/18/2023 ID: Taylor Chambers, Taylor Chambers 1946/02/27, MRN [id number]  Primary Cardiologist:  Lorine Bears, MD  Chief Complaint   Taylor Chambers is a 78 y.o. female who presents to the clinic today for hospital follow up.   Patient Profile   Taylor Chambers is followed by Dr. Kirke Corin for the history outlined below.      Past medical history significant for: Nonobstructive CAD. CTA 04/07/2022: Coronary calcium score 95.3 (59th percentile).  Mild proximal LCx stenosis, minimal proximal RCA stenosis.  Mild nonobstructive CAD. HFimpEF/mitral valve regurgitation. Echo 06/26/2023: EF 55 to 60%.  No RWMA.  Grade II DD.  Normal RV size/function.  Moderately elevated PA pressure, RVSP 53.2 mmHg.  Moderate to severe MR.  Mild to moderate TR.  Aortic valve sclerosis/calcification without stenosis. PAF/a-flutter. 14-day ZIO 12/13/2022: HR 56 to 214 bpm, average 84 bpm.  Predominantly sinus rhythm.  106 runs of SVT fastest lasting 6 beats max rate 214 bpm, longest 2 minutes 10 seconds average rate 146 bpm.  A-fib/flutter occurred 1% of the time ranging from 119 to 213 bpm, average 153 bpm, longest lasting 2 hours 4 minutes with average rate 152 bpm.  Occasional PACs, rare PVCs. Hypertension. Hyperlipidemia. Lipid panel 05/16/2023: Direct LDL 71, HDL 64, TG 121, total 159. COPD. Prior tobacco abuse.  In summary, patient was previously followed by Dr. Juliann Pares.  She established care with Dr. Kirke Corin in July 2024.  Echo September 2021 showed EF > 55%, no RWMA, Grade I DD, normal RV function, moderate MR, mild to moderate TR.  Nuclear stress test September 2021 showed no evidence of ischemia with EF 64%.  Echo August 2023 showed EF 45% with moderate MR.  Coronary CTA November 2023 with calcium score of 95 as detailed above.  Noncardiac overread of coronary CTA showed resolution of tree-in-bud nodules when compared to prior study as well as a solid pulmonary nodule in the right  lower lobe that was unchanged when compared to study from February 2023.  Patient was admitted to the hospital in April 2024 with COPD exacerbation and was noted to be in A-fib with RVR.  She converted to sinus rhythm with diltiazem.  She was discharged on anticoagulation.  Upon establishing care with our office in July 2024 she noted occasional exertional chest pain and shortness of breath as well as frequent palpitations particularly with exertion.  She was in sinus rhythm with PACs at the time of her visit.  To further evaluate palpitation burden she underwent ZIO monitoring which showed predominantly sinus rhythm and 1% A-fib burden as detailed above.  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  She underwent TEE in November 2024 to evaluate mitral valve which showed EF of 60 to 65%, no LA/LAA thrombus, moderate MR, aortic atherosclerosis involving the aortic arch and descending aorta, no evidence of interatrial shunt.  Patient was last seen in the office by Eula Listen, PA-C on 05/16/2023 for routine follow-up.  She was doing well at that time and no medication changes were made.  Patient was seen in the ED on 06/22/2023 with a several week history of shortness of breath.  BNP 1169.  Troponin 36.  Chest x-ray showed blunting of the right costophrenic angle with mild basilar atelectasis and COPD.  EKG showed sinus rhythm with rare PVCs.  She had symptomatic improvement with DuoNeb and was treated for COPD exacerbation with doxycycline, albuterol, prednisone.  She returned to the ED in the early morning on 06/24/2023 with increased confusion and continued dyspnea with associated malaise, fatigue and orthopnea.  She had acute hypercarbic and hypoxic respiratory failure initially requiring BiPAP.  She denies chest pain,  palpitations, lower extremity edema, or abdominal distention.  She reported diminished appetite.  She was admitted for acute hypoxic respiratory failure secondary to COPD exacerbation and acute on chronic HFpEF.  Peak troponin 31.  D-dimer 1.63, BNP 1629 (increased from 1169 3 days prior).  Chest x-ray showed new reticulonodular opacity asymmetric to the left lung with small pleural effusions favoring pneumonia of her edema.  CTA chest negative for PE with dependent lung opacities associated with moderate right and small left pleural effusion suspicious for multifocal infection and dependent atelectasis as well as moderate emphysema.  She was treated with IV azithromycin, cefepime, vancomycin, IV Lasix, DuoNebs, LR, and Solu-Medrol.  Overnight she was noted to be tachycardiac with rates 150 to 160s bpm.  This occurred in the setting of hypoxia and patient not wearing supplemental oxygen.  She received IV metoprolol 5 mg x 2.  PTA metoprolol and Eliquis were not continued upon admission though resumed overnight when patient became tachycardic.  Rates persisted in the 140s and repeat EKG was concerning for a flutter with 2-1 AV block, HR 140 bpm.  Review of telemetry revealed HR ranging from 137 to 142 bpm.  Morning labs on 06/25/2023 showed potassium 2.9, albumin 3.4, improving AST/ALT, WBC 21, hemoglobin 11.4.  She was treated with IV amiodarone converting to sinus bradycardia.  She maintained sinus rhythm throughout the rest of her hospital stay with the exception of a 15-minute run of A-fib.  Toprol was increased to 50 mg twice a day and she was continued on p.o. amiodarone.  She was discharged on 06/28/2023.    She returned to the ED on 06/29/2023 for respiratory distress requiring BiPAP support.  Chest x-ray demonstrated slight interval worsening of patchy airspace opacities most prominent along the left upper lobe suggestive of infection.  Bilateral trace pleural effusions.  Patient underwent right thoracentesis  yielding 300 mL of pleural fluid.  Patient declined SNF placement.  She was discharged on 07/03/2023.     History of Present Illness    Today, patient is accompanied by her husband. She reports she now has right lung pneumonia. She is very frustrated with her situation and feels if they can get the infection completely out of her lungs she will feel better. She wears 3L continuous supplemental O2 during the day and CPAP at night. She reports feeling palpitations described as her heart racing when her work of breathing is increased.  She cannot necessarily say there is a particular pattern to this.  She does have some minimal chest pain but feels this could be related to her shortness of breath and labored breathing.  She denies lower extremity edema or abdominal distention.  She has been followed closely by pulmonology.  Discussed recent testing over the last several months.  All questions answered.     ROS: All other systems reviewed and are otherwise negative except as noted in History of Present Illness.  EKGs/Labs Reviewed    EKG Interpretation Date/Time:  Friday August 18 2023 14:26:39 EDT Ventricular Rate:  97 PR Interval:  130 QRS Duration:  76 QT Interval:  348 QTC Calculation: 441  R Axis:   84  Text Interpretation: Normal sinus rhythm Possible Left atrial enlargement Nonspecific ST abnormality When compared with ECG of 02-Jul-2023 15:22, Premature atrial complexes are no longer Present QT has shortened Confirmed by Carlos Levering [phone]) on 08/18/2023 2:42:26 PM   07/02/2023: ALT 97; AST 29 07/03/2023: BUN 20; Creatinine, Ser 0.71; Potassium 3.4; Sodium 138   07/03/2023: Hemoglobin 13.5; WBC 20.3   06/24/2023: TSH 7.817   06/30/2023: B Natriuretic Peptide 1,489.4   Risk Assessment/Calculations     CHA2DS2-VASc Score = 5   This indicates a 7.2% annual risk of stroke. The patient's score is based upon: CHF History: 1 HTN History: 1 Diabetes History: 0 Stroke History:  0 Vascular Disease History: 0 Age Score: 2 Gender Score: 1             Physical Exam    VS:  BP 130/60   Pulse 97   Ht 5\' 2"  (1.575 m)   Wt 101 lb (45.8 kg)   SpO2 98%   BMI 18.47 kg/m  , BMI Body mass index is 18.47 kg/m.  GEN: Well nourished, well developed, in no acute distress. Neck: No JVD or carotid bruits. Cardiac:  RRR. No murmurs. No rubs or gallops.   Respiratory:  Respirations regular and unlabored.  Diminished breath sounds throughout bilaterally without rales, wheezing or rhonchi. GI: Soft, nontender, nondistended. Extremities: Radials/DP/PT 2+ and equal bilaterally. No clubbing or cyanosis. No edema.  Skin: Warm and dry, no rash. Neuro: Strength intact.  Assessment & Plan   Nonobstructive CAD CTA November 2023 showed calcium score of 95.3, mild proximal LCx stenosis, minimal proximal RCA stenosis.  Patient reports minimal chest pain but feels this could be related to her shortness of breath and labored breathing.  EKG today without acute changes. -Will defer ischemic testing in the setting of chest pain with pneumonia and COPD. -Continue Toprol, pravastatin.  Patient not on aspirin secondary to Eliquis.  HFimpEF/mitral valve regurgitation/dyspnea/pneumonia Echo January 2025 showed EF 55 to 60%, no RWMA, Grade II DD, normal RV size/function, moderately elevated PA pressure, moderate to severe MR, mild to moderate TR.  TEE November 2024 demonstrated moderate MR.  Patient underwent to hospital admissions in January for COPD/CHF exacerbation.  Patient reports she now has right lung pneumonia.  She recently saw pulmonology and they took cultures to hopefully target infection more accurately.  She is very frustrated with her current situation and feels if infection could be cleared up she will feel greatly improved.  She denies lower extremity edema or abdominal distention.  She does have a poor appetite but I suspect this is more closely related to her labored breathing.   Euvolemic and well compensated on exam. -Continue Toprol, Lasix.  PAF/a-flutter 14-day ZIO July 2024 showed HR 56 to 214 bpm, average 84 bpm, predominantly sinus rhythm, 106 runs of SVT fastest lasting 6 beats max rate 214 bpm, longest 2 minutes 10 seconds average rate 146 bpm.  A-fib/flutter occurred 1% of the time ranging from 119 to 213 bpm, average 153 bpm, longest lasting 2 hours 4 minutes with average rate 152 bpm.  Occasional PACs, rare PVCs.  Patient had a-flutter with 2-1 AV block during hospitalization in mid January.  Her rate was difficult to control and she received treatment with IV amiodarone converted to sinus bradycardia.  She was transitioned to p.o. amiodarone and Toprol was increased.  Denies spontaneous bleeding concerns.  Patient reports she does have episodes of racing heart beat related to her  work of breathing.  She cannot necessarily pinpoint a pattern to this.  EKG today shows NSR, 97 bpm. -Continue amiodarone, Eliquis. Appropriate Eliquis dose. -Increase Toprol to 25 mg twice daily.  Hypertension BP today 130/60.  No report of dizziness or headaches. -Continue Toprol.  Hyperlipidemia LDL December 2024 71, at goal. -Continue pravastatin.  Disposition: Increase Toprol to 25 mg twice daily.  Return in 3 months or sooner as needed.         Signed, Etta Grandchild. Saraiyah Hemminger, DNP, NP-C

## 2023-08-18 ENCOUNTER — Encounter: Payer: Self-pay | Admitting: Student

## 2023-08-18 ENCOUNTER — Ambulatory Visit: Payer: Medicare HMO | Attending: Student | Admitting: Student

## 2023-08-18 VITALS — BP 130/60 | HR 97 | Ht 62.0 in | Wt 101.0 lb

## 2023-08-18 DIAGNOSIS — J189 Pneumonia, unspecified organism: Secondary | ICD-10-CM

## 2023-08-18 DIAGNOSIS — R0602 Shortness of breath: Secondary | ICD-10-CM

## 2023-08-18 DIAGNOSIS — I480 Paroxysmal atrial fibrillation: Secondary | ICD-10-CM

## 2023-08-18 DIAGNOSIS — I2510 Atherosclerotic heart disease of native coronary artery without angina pectoris: Secondary | ICD-10-CM | POA: Diagnosis not present

## 2023-08-18 DIAGNOSIS — I5032 Chronic diastolic (congestive) heart failure: Secondary | ICD-10-CM | POA: Diagnosis not present

## 2023-08-18 DIAGNOSIS — I340 Nonrheumatic mitral (valve) insufficiency: Secondary | ICD-10-CM | POA: Diagnosis not present

## 2023-08-18 DIAGNOSIS — I4892 Unspecified atrial flutter: Secondary | ICD-10-CM

## 2023-08-18 DIAGNOSIS — I10 Essential (primary) hypertension: Secondary | ICD-10-CM

## 2023-08-18 DIAGNOSIS — E785 Hyperlipidemia, unspecified: Secondary | ICD-10-CM

## 2023-08-18 MED ORDER — METOPROLOL SUCCINATE ER 50 MG PO TB24: 25.0000 mg | ORAL_TABLET | Freq: Two times a day (BID) | ORAL | 3 refills | Status: DC

## 2023-08-18 NOTE — Patient Instructions (Addendum)
 Medication Instructions:  Your physician recommends the following medication changes.  INCREASE: Toprol-XL (metoprolol succinate) 25 mg twice daily   *If you need a refill on your cardiac medications before your next appointment, please call your pharmacy*   Lab Work: None ordered at this time    Follow-Up: At [facility], you and your health needs are our priority.  As part of our continuing mission to provide you with exceptional heart care, we have created designated Provider Care Teams.  These Care Teams include your primary Cardiologist (physician) and Advanced Practice Providers (APPs -  Physician Assistants and Nurse Practitioners) who all work together to provide you with the care you need, when you need it.    Your next appointment:   3 month(s)  Provider:   You may see Lorine Bears, MD or Carlos Levering, NP

## 2023-08-21 ENCOUNTER — Emergency Department

## 2023-08-21 ENCOUNTER — Inpatient Hospital Stay
Admission: EM | Admit: 2023-08-21 | Discharge: 2023-08-29 | DRG: 871 | Disposition: A | Discharge status: Home Health | Attending: Osteopathic Medicine | Admitting: Osteopathic Medicine

## 2023-08-21 ENCOUNTER — Other Ambulatory Visit: Payer: Self-pay

## 2023-08-21 DIAGNOSIS — R6520 Severe sepsis without septic shock: Secondary | ICD-10-CM | POA: Diagnosis present

## 2023-08-21 DIAGNOSIS — E785 Hyperlipidemia, unspecified: Secondary | ICD-10-CM | POA: Insufficient documentation

## 2023-08-21 DIAGNOSIS — I4892 Unspecified atrial flutter: Secondary | ICD-10-CM | POA: Diagnosis present

## 2023-08-21 DIAGNOSIS — R0602 Shortness of breath: Secondary | ICD-10-CM | POA: Diagnosis not present

## 2023-08-21 DIAGNOSIS — E43 Unspecified severe protein-calorie malnutrition: Secondary | ICD-10-CM | POA: Diagnosis present

## 2023-08-21 DIAGNOSIS — Z7982 Long term (current) use of aspirin: Secondary | ICD-10-CM

## 2023-08-21 DIAGNOSIS — Z1152 Encounter for screening for COVID-19: Secondary | ICD-10-CM | POA: Diagnosis not present

## 2023-08-21 DIAGNOSIS — R7989 Other specified abnormal findings of blood chemistry: Secondary | ICD-10-CM | POA: Diagnosis not present

## 2023-08-21 DIAGNOSIS — Z681 Body mass index (BMI) 19 or less, adult: Secondary | ICD-10-CM

## 2023-08-21 DIAGNOSIS — J9622 Acute and chronic respiratory failure with hypercapnia: Secondary | ICD-10-CM | POA: Diagnosis present

## 2023-08-21 DIAGNOSIS — J439 Emphysema, unspecified: Secondary | ICD-10-CM | POA: Diagnosis present

## 2023-08-21 DIAGNOSIS — R54 Age-related physical debility: Secondary | ICD-10-CM | POA: Diagnosis present

## 2023-08-21 DIAGNOSIS — I110 Hypertensive heart disease with heart failure: Secondary | ICD-10-CM | POA: Diagnosis present

## 2023-08-21 DIAGNOSIS — J9621 Acute and chronic respiratory failure with hypoxia: Secondary | ICD-10-CM | POA: Diagnosis present

## 2023-08-21 DIAGNOSIS — I5033 Acute on chronic diastolic (congestive) heart failure: Secondary | ICD-10-CM | POA: Diagnosis present

## 2023-08-21 DIAGNOSIS — I4891 Unspecified atrial fibrillation: Secondary | ICD-10-CM | POA: Diagnosis present

## 2023-08-21 DIAGNOSIS — R0603 Acute respiratory distress: Secondary | ICD-10-CM | POA: Diagnosis not present

## 2023-08-21 DIAGNOSIS — E876 Hypokalemia: Secondary | ICD-10-CM | POA: Diagnosis present

## 2023-08-21 DIAGNOSIS — F419 Anxiety disorder, unspecified: Secondary | ICD-10-CM | POA: Diagnosis present

## 2023-08-21 DIAGNOSIS — F319 Bipolar disorder, unspecified: Secondary | ICD-10-CM | POA: Diagnosis present

## 2023-08-21 DIAGNOSIS — J189 Pneumonia, unspecified organism: Secondary | ICD-10-CM | POA: Diagnosis present

## 2023-08-21 DIAGNOSIS — Z66 Do not resuscitate: Secondary | ICD-10-CM | POA: Diagnosis present

## 2023-08-21 DIAGNOSIS — J440 Chronic obstructive pulmonary disease with acute lower respiratory infection: Secondary | ICD-10-CM | POA: Diagnosis present

## 2023-08-21 DIAGNOSIS — R64 Cachexia: Secondary | ICD-10-CM | POA: Diagnosis present

## 2023-08-21 DIAGNOSIS — I2489 Other forms of acute ischemic heart disease: Secondary | ICD-10-CM | POA: Diagnosis present

## 2023-08-21 DIAGNOSIS — J441 Chronic obstructive pulmonary disease with (acute) exacerbation: Principal | ICD-10-CM | POA: Diagnosis present

## 2023-08-21 DIAGNOSIS — I480 Paroxysmal atrial fibrillation: Secondary | ICD-10-CM | POA: Diagnosis present

## 2023-08-21 DIAGNOSIS — F32A Depression, unspecified: Secondary | ICD-10-CM | POA: Insufficient documentation

## 2023-08-21 DIAGNOSIS — J449 Chronic obstructive pulmonary disease, unspecified: Secondary | ICD-10-CM | POA: Diagnosis present

## 2023-08-21 DIAGNOSIS — Z515 Encounter for palliative care: Secondary | ICD-10-CM | POA: Diagnosis not present

## 2023-08-21 DIAGNOSIS — I10 Essential (primary) hypertension: Secondary | ICD-10-CM | POA: Diagnosis present

## 2023-08-21 DIAGNOSIS — I4821 Permanent atrial fibrillation: Secondary | ICD-10-CM | POA: Diagnosis not present

## 2023-08-21 DIAGNOSIS — D72829 Elevated white blood cell count, unspecified: Secondary | ICD-10-CM | POA: Diagnosis present

## 2023-08-21 DIAGNOSIS — Z7901 Long term (current) use of anticoagulants: Secondary | ICD-10-CM

## 2023-08-21 DIAGNOSIS — A419 Sepsis, unspecified organism: Principal | ICD-10-CM | POA: Diagnosis present

## 2023-08-21 DIAGNOSIS — F17200 Nicotine dependence, unspecified, uncomplicated: Secondary | ICD-10-CM | POA: Diagnosis present

## 2023-08-21 DIAGNOSIS — I340 Nonrheumatic mitral (valve) insufficiency: Secondary | ICD-10-CM | POA: Diagnosis present

## 2023-08-21 DIAGNOSIS — Z96641 Presence of right artificial hip joint: Secondary | ICD-10-CM | POA: Diagnosis present

## 2023-08-21 DIAGNOSIS — Z79899 Other long term (current) drug therapy: Secondary | ICD-10-CM

## 2023-08-21 DIAGNOSIS — M810 Age-related osteoporosis without current pathological fracture: Secondary | ICD-10-CM | POA: Diagnosis present

## 2023-08-21 DIAGNOSIS — G43909 Migraine, unspecified, not intractable, without status migrainosus: Secondary | ICD-10-CM | POA: Diagnosis present

## 2023-08-21 DIAGNOSIS — I2510 Atherosclerotic heart disease of native coronary artery without angina pectoris: Secondary | ICD-10-CM | POA: Diagnosis present

## 2023-08-21 DIAGNOSIS — Z803 Family history of malignant neoplasm of breast: Secondary | ICD-10-CM

## 2023-08-21 DIAGNOSIS — Z888 Allergy status to other drugs, medicaments and biological substances status: Secondary | ICD-10-CM

## 2023-08-21 LAB — CBC WITH DIFFERENTIAL/PLATELET
Abs Immature Granulocytes: 1.43 10*3/uL — ABNORMAL HIGH (ref 0.00–0.07)
Basophils Absolute: 0 10*3/uL (ref 0.0–0.1)
Basophils Relative: 0 %
Eosinophils Absolute: 0 10*3/uL (ref 0.0–0.5)
Eosinophils Relative: 0 %
HCT: 41.8 % (ref 36.0–46.0)
Hemoglobin: 13.3 g/dL (ref 12.0–15.0)
Immature Granulocytes: 5 %
Lymphocytes Relative: 8 %
Lymphs Abs: 2.4 10*3/uL (ref 0.7–4.0)
MCH: 34.2 pg — ABNORMAL HIGH (ref 26.0–34.0)
MCHC: 31.8 g/dL (ref 30.0–36.0)
MCV: 107.5 fL — ABNORMAL HIGH (ref 80.0–100.0)
Monocytes Absolute: 3 10*3/uL — ABNORMAL HIGH (ref 0.1–1.0)
Monocytes Relative: 10 %
Neutro Abs: 22.5 10*3/uL — ABNORMAL HIGH (ref 1.7–7.7)
Neutrophils Relative %: 77 %
Platelets: 630 10*3/uL — ABNORMAL HIGH (ref 150–400)
RBC: 3.89 MIL/uL (ref 3.87–5.11)
RDW: 13.8 % (ref 11.5–15.5)
Smear Review: NORMAL
WBC: 29.4 10*3/uL — ABNORMAL HIGH (ref 4.0–10.5)
nRBC: 0.1 % (ref 0.0–0.2)

## 2023-08-21 LAB — COMPREHENSIVE METABOLIC PANEL
ALT: 49 U/L — ABNORMAL HIGH (ref 0–44)
AST: 38 U/L (ref 15–41)
Albumin: 3 g/dL — ABNORMAL LOW (ref 3.5–5.0)
Alkaline Phosphatase: 75 U/L (ref 38–126)
Anion gap: 13 (ref 5–15)
BUN: 32 mg/dL — ABNORMAL HIGH (ref 8–23)
CO2: 20 mmol/L — ABNORMAL LOW (ref 22–32)
Calcium: 8.7 mg/dL — ABNORMAL LOW (ref 8.9–10.3)
Chloride: 108 mmol/L (ref 98–111)
Creatinine, Ser: 0.83 mg/dL (ref 0.44–1.00)
GFR, Estimated: >60 mL/min (ref >60)
Glucose, Bld: 327 mg/dL — ABNORMAL HIGH (ref 70–99)
Potassium: 5.2 mmol/L — ABNORMAL HIGH (ref 3.5–5.1)
Sodium: 141 mmol/L (ref 135–145)
Total Bilirubin: 0.7 mg/dL (ref 0.0–1.2)
Total Protein: 6.3 g/dL — ABNORMAL LOW (ref 6.5–8.1)

## 2023-08-21 LAB — RESP PANEL BY RT-PCR (RSV, FLU A&B, COVID)  RVPGX2
Influenza A by PCR: NEGATIVE
Influenza B by PCR: NEGATIVE
Resp Syncytial Virus by PCR: NEGATIVE
SARS Coronavirus 2 by RT PCR: NEGATIVE

## 2023-08-21 LAB — BLOOD GAS, VENOUS
Acid-base deficit: 3.1 mmol/L — ABNORMAL HIGH (ref 0.0–2.0)
Bicarbonate: 25 mmol/L (ref 20.0–28.0)
O2 Saturation: 90.4 %
Patient temperature: 37
pCO2, Ven: 57 mmHg (ref 44–60)
pH, Ven: 7.25 (ref 7.25–7.43)
pO2, Ven: 59 mmHg — ABNORMAL HIGH (ref 32–45)

## 2023-08-21 LAB — LACTIC ACID, PLASMA
Lactic Acid, Venous: 3.2 mmol/L (ref 0.5–1.9)
Lactic Acid, Venous: 3 mmol/L (ref 0.5–1.9)

## 2023-08-21 LAB — PROTIME-INR
INR: 1.2 (ref 0.8–1.2)
Prothrombin Time: 14.9 s (ref 11.4–15.2)

## 2023-08-21 LAB — TROPONIN I (HIGH SENSITIVITY)
Troponin I (High Sensitivity): 24 ng/L — ABNORMAL HIGH (ref <18)
Troponin I (High Sensitivity): 51 ng/L — ABNORMAL HIGH (ref <18)

## 2023-08-21 LAB — BRAIN NATRIURETIC PEPTIDE: B Natriuretic Peptide: 887.9 pg/mL — ABNORMAL HIGH (ref 0.0–100.0)

## 2023-08-21 LAB — CBG MONITORING, ED
Glucose-Capillary: 151 mg/dL — ABNORMAL HIGH (ref 70–99)
Glucose-Capillary: 115 mg/dL — ABNORMAL HIGH (ref 70–99)

## 2023-08-21 MED ORDER — ALBUTEROL SULFATE (2.5 MG/3ML) 0.083% IN NEBU
10.0000 mg/h | INHALATION_SOLUTION | Freq: Once | RESPIRATORY_TRACT | Status: AC
Start: 2023-08-21 — End: 2023-08-21
  Administered 2023-08-21: 10 mg/h via RESPIRATORY_TRACT
  Filled 2023-08-21: qty 12

## 2023-08-21 MED ORDER — MORPHINE SULFATE (PF) 2 MG/ML IV SOLN
2.0000 mg | Freq: Once | INTRAVENOUS | Status: AC
  Administered 2023-08-21: 2 mg via INTRAVENOUS
  Filled 2023-08-21: qty 1

## 2023-08-21 MED ORDER — LOSARTAN POTASSIUM 50 MG PO TABS
100.0000 mg | ORAL_TABLET | Freq: Every day | ORAL | Status: DC
  Administered 2023-08-22 – 2023-08-29 (×8): 100 mg via ORAL
  Filled 2023-08-21 (×8): qty 2

## 2023-08-21 MED ORDER — AZITHROMYCIN 500 MG IV SOLR: 500.0000 mg | Freq: Once | INTRAVENOUS | Status: DC

## 2023-08-21 MED ORDER — METHYLPREDNISOLONE SODIUM SUCC 40 MG IJ SOLR
40.0000 mg | Freq: Every day | INTRAMUSCULAR | Status: AC
  Administered 2023-08-22: 40 mg via INTRAVENOUS
  Filled 2023-08-21: qty 1

## 2023-08-21 MED ORDER — PANTOPRAZOLE SODIUM 40 MG PO TBEC: 80.0000 mg | DELAYED_RELEASE_TABLET | Freq: Every day | ORAL | Status: DC | PRN

## 2023-08-21 MED ORDER — ACETAMINOPHEN 650 MG RE SUPP: 650.0000 mg | Freq: Four times a day (QID) | RECTAL | Status: AC | PRN

## 2023-08-21 MED ORDER — SODIUM CHLORIDE 0.9 % IV SOLN
100.0000 mg | Freq: Two times a day (BID) | INTRAVENOUS | Status: AC
Start: 2023-08-21 — End: 2023-08-26
  Administered 2023-08-21 – 2023-08-26 (×10): 100 mg via INTRAVENOUS
  Filled 2023-08-21 (×12): qty 100

## 2023-08-21 MED ORDER — ADULT MULTIVITAMIN W/MINERALS CH
1.0000 | ORAL_TABLET | Freq: Every day | ORAL | Status: DC
  Administered 2023-08-22 – 2023-08-29 (×8): 1 via ORAL
  Filled 2023-08-21 (×8): qty 1

## 2023-08-21 MED ORDER — PRAVASTATIN SODIUM 20 MG PO TABS
10.0000 mg | ORAL_TABLET | Freq: Every day | ORAL | Status: DC
  Administered 2023-08-23 – 2023-08-28 (×6): 10 mg via ORAL
  Filled 2023-08-21 (×6): qty 1

## 2023-08-21 MED ORDER — SODIUM CHLORIDE 0.9 % IV BOLUS
500.0000 mL | Freq: Once | INTRAVENOUS | Status: AC
  Administered 2023-08-21: 500 mL via INTRAVENOUS

## 2023-08-21 MED ORDER — SODIUM CHLORIDE 0.9 % IV SOLN
2.0000 g | INTRAVENOUS | Status: AC
  Administered 2023-08-22 – 2023-08-25 (×4): 2 g via INTRAVENOUS
  Filled 2023-08-21 (×4): qty 20

## 2023-08-21 MED ORDER — ASPIRIN 81 MG PO TBEC
81.0000 mg | DELAYED_RELEASE_TABLET | Freq: Every day | ORAL | Status: DC
  Administered 2023-08-22 – 2023-08-26 (×5): 81 mg via ORAL
  Filled 2023-08-21 (×5): qty 1

## 2023-08-21 MED ORDER — ONDANSETRON HCL 4 MG PO TABS: 4.0000 mg | ORAL_TABLET | Freq: Four times a day (QID) | ORAL | Status: AC | PRN

## 2023-08-21 MED ORDER — VITAMIN D 25 MCG (1000 UNIT) PO TABS
5000.0000 [IU] | ORAL_TABLET | Freq: Every day | ORAL | Status: DC
  Administered 2023-08-22 – 2023-08-29 (×8): 5000 [IU] via ORAL
  Filled 2023-08-21 (×8): qty 5

## 2023-08-21 MED ORDER — SERTRALINE HCL 50 MG PO TABS
100.0000 mg | ORAL_TABLET | Freq: Every day | ORAL | Status: DC
  Administered 2023-08-22 – 2023-08-29 (×8): 100 mg via ORAL
  Filled 2023-08-21 (×8): qty 2

## 2023-08-21 MED ORDER — APIXABAN 5 MG PO TABS
5.0000 mg | ORAL_TABLET | Freq: Two times a day (BID) | ORAL | Status: DC
Start: 2023-08-21 — End: 2023-08-29
  Administered 2023-08-21 – 2023-08-29 (×16): 5 mg via ORAL
  Filled 2023-08-21 (×16): qty 1

## 2023-08-21 MED ORDER — SODIUM CHLORIDE 0.9 % IV SOLN: 2.0000 g | Freq: Once | INTRAVENOUS | Status: DC

## 2023-08-21 MED ORDER — INSULIN ASPART 100 UNIT/ML IJ SOLN: 0.0000 [IU] | Freq: Every day | INTRAMUSCULAR | Status: DC

## 2023-08-21 MED ORDER — LACTATED RINGERS IV BOLUS: 1000.0000 mL | Freq: Once | INTRAVENOUS | Status: DC

## 2023-08-21 MED ORDER — INSULIN ASPART 100 UNIT/ML IJ SOLN
0.0000 [IU] | Freq: Three times a day (TID) | INTRAMUSCULAR | Status: DC
  Administered 2023-08-21: 1 [IU] via SUBCUTANEOUS
  Administered 2023-08-24: 2 [IU] via SUBCUTANEOUS
  Administered 2023-08-24: 5 [IU] via SUBCUTANEOUS
  Administered 2023-08-25: 3 [IU] via SUBCUTANEOUS
  Administered 2023-08-25: 1 [IU] via SUBCUTANEOUS
  Administered 2023-08-25: 3 [IU] via SUBCUTANEOUS
  Administered 2023-08-26: 1 [IU] via SUBCUTANEOUS
  Administered 2023-08-26: 2 [IU] via SUBCUTANEOUS
  Filled 2023-08-21 (×8): qty 1

## 2023-08-21 MED ORDER — IPRATROPIUM-ALBUTEROL 0.5-2.5 (3) MG/3ML IN SOLN
6.0000 mL | Freq: Once | RESPIRATORY_TRACT | Status: AC
  Administered 2023-08-21: 6 mL via RESPIRATORY_TRACT
  Filled 2023-08-21: qty 9

## 2023-08-21 MED ORDER — SODIUM CHLORIDE 0.9 % IV SOLN: INTRAVENOUS | Status: AC

## 2023-08-21 MED ORDER — HYDRALAZINE HCL 20 MG/ML IJ SOLN: 5.0000 mg | Freq: Four times a day (QID) | INTRAMUSCULAR | Status: AC | PRN

## 2023-08-21 MED ORDER — LACTATED RINGERS IV BOLUS (SEPSIS)
1000.0000 mL | Freq: Once | INTRAVENOUS | Status: AC
  Administered 2023-08-21: 1000 mL via INTRAVENOUS

## 2023-08-21 MED ORDER — METOPROLOL SUCCINATE ER 25 MG PO TB24
25.0000 mg | ORAL_TABLET | Freq: Two times a day (BID) | ORAL | Status: DC
Start: 2023-08-22 — End: 2023-08-29
  Administered 2023-08-22 – 2023-08-29 (×15): 25 mg via ORAL
  Filled 2023-08-21 (×15): qty 1

## 2023-08-21 MED ORDER — ACETAMINOPHEN 325 MG PO TABS
650.0000 mg | ORAL_TABLET | Freq: Four times a day (QID) | ORAL | Status: AC | PRN
  Administered 2023-08-22: 650 mg via ORAL
  Filled 2023-08-21: qty 2

## 2023-08-21 MED ORDER — ONDANSETRON HCL 4 MG/2ML IJ SOLN: 4.0000 mg | Freq: Four times a day (QID) | INTRAMUSCULAR | Status: AC | PRN

## 2023-08-21 MED ORDER — METHYLPREDNISOLONE SODIUM SUCC 125 MG IJ SOLR
125.0000 mg | Freq: Once | INTRAMUSCULAR | Status: AC
  Administered 2023-08-21: 125 mg via INTRAVENOUS
  Filled 2023-08-21: qty 2

## 2023-08-21 MED ORDER — SENNOSIDES-DOCUSATE SODIUM 8.6-50 MG PO TABS: 1.0000 | ORAL_TABLET | Freq: Every evening | ORAL | Status: DC | PRN

## 2023-08-21 MED ORDER — SODIUM CHLORIDE 0.9 % IV SOLN
1.0000 g | Freq: Once | INTRAVENOUS | Status: AC
  Administered 2023-08-21: 1 g via INTRAVENOUS
  Filled 2023-08-21: qty 10

## 2023-08-21 MED ORDER — LACTATED RINGERS IV SOLN: 150.0000 mL/h | INTRAVENOUS | Status: DC

## 2023-08-21 MED ORDER — IPRATROPIUM-ALBUTEROL 0.5-2.5 (3) MG/3ML IN SOLN
3.0000 mL | Freq: Four times a day (QID) | RESPIRATORY_TRACT | Status: AC
Start: 2023-08-21 — End: 2023-08-22
  Administered 2023-08-21 – 2023-08-22 (×4): 3 mL via RESPIRATORY_TRACT
  Filled 2023-08-21 (×4): qty 3

## 2023-08-21 MED ORDER — SODIUM CHLORIDE 0.9 % IV SOLN
100.0000 mg | Freq: Once | INTRAVENOUS | Status: AC
  Administered 2023-08-21: 100 mg via INTRAVENOUS
  Filled 2023-08-21: qty 100

## 2023-08-21 NOTE — Sepsis Progress Note (Signed)
 Elink monitoring for the code sepsis protocol.

## 2023-08-21 NOTE — Sepsis Progress Note (Signed)
 Notified bedside nurse of need to order blood cultures. (Alert MD)

## 2023-08-21 NOTE — Assessment & Plan Note (Signed)
 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

## 2023-08-21 NOTE — Assessment & Plan Note (Addendum)
 Patient had increased respiration, heart rate, organ involvement is pulmonary and/or cardiac as patient is requiring BiPAP therapy and has elevated high sensitive troponin Maintain goal MAP greater than 65 Lactic acid was 3.2 and improved to 3.0 Status post LR 1 L bolus per EDP On admission order sodium chloride bolus 500 mL liter IV one-time dose Sodium chloride infusion at 150 mL/h, 1 day ordered Blood cultures x 2 are in process Continue with ceftriaxone 2 g IV daily to complete a 5-day course As patient has recently been prescribed azithromycin, agree with doxycycline 100 mg IV twice daily, to complete a 5-day course Status post Solu-Medrol 125 mg IV one-time dose, continue Solu-Medrol 40 mg IV one-time for 3/18 Admit to stepdown, inpatient

## 2023-08-21 NOTE — Assessment & Plan Note (Signed)
 Presumed secondary to COPD exacerbation secondary to multilobar pneumonia, treatment per above

## 2023-08-21 NOTE — Assessment & Plan Note (Signed)
 Home pravastatin 10 mg daily resumed

## 2023-08-21 NOTE — Hospital Course (Addendum)
 Hospital course / significant events:   HPI: 78 year old female with history of COPD, hyperlipidemia, heart failure preserved ejection fraction, hypertension, depression, who presents emergency department for chief concerns of respiratory distress via EMS. Per ED nursing documentation, patient was on CPAP on arrival and has labored breathing.   Of note, patient was admitted 1/18 - 06/28/2023 for AECOPD, A-fib with RVR, and CHF exacerbation and returned the next day 1/23 was discharged on 1/27 for acute on chronic respiratory failure with hypoxia and hypercapnia.   03/17: to ED, admitted to hospitalist service. BiPap --. 4L [place]. Chest x-ray favored multifocal pneumonia, doxy initiated, given 1L bolus. Started steroids. Afib RVR, started  03/18: Palliative care consult. Continued IV fluids.  03/19: Overnight AfibRVR, started amiodarone gtt. HR remained elevated, Cardiology consult. 4L O2 --> back on BiPap, lasix. Converted to NSR later this afternoon.  03/20: good UOP and off BiPap, still needing 6L O2 per [place]. Continue diuresing. HR has been controlled  03/21: walked w/ PT and needed BiPap but then back on [place]> Reports BiPap helps w/ SOB. PAF overnight up to 150's --> Currently NSR 70s. Home BiPAP ordered and in process  03/22: TOC/HH working on home BiPAP. Cardio managing transition to po diuretics and rate control  03/23: doing well on po amiodarone, transitioning to po lasix 03/24: home BiPap is in process      Consultants:  Palliative care Cardiology   Procedures/Surgeries: none      ASSESSMENT & PLAN:   Severe sepsis with acute organ dysfunction  Question solely sepsis d/t PNA +/- SIRS component d/t CHF/COPD Treat underlying cause(s) as below  CAP w/ elevation procalcitonin  S/p 5 days abx  Blood cultures x 2 are neg   COPD exacerbation d/t CAP finished steroid burst w/ prednisone Supplemental O2  Scheduled and PRN DuoNebs  HFpEF acute on chronic  Elevated BNP but this  appears chronic Recent Echo, EF 55-60, G2DD, mod-severe MR - will not repeat Echo  Lasix IV to po per cardiology  Strict I&O  Continue toprol, losartan  Cardiology following  Acute on chronic hypoxic respiratory failure  D/t CHF, COPD, CAP BiPap dependent on occasion Patients condition quickly deteriorates without ventilator. Removal of the ventilator may cause serious harm to the patient, exacerbation of condition and hospital readmission. Bilevel/RAD has been tried and failed to maintain or stabilize the patient. Bilevel cannot meet current volume requirements. Patient requires frequent durations of ventilatory support. Intermittent usage is insufficient.   A-fib Rapid, paroxysmal  Eliquis 5 mg p.o. twice daily p.o. Amio 400 mg twice daily, taper to 200 twice daily after 7 days, then 200 mg daily after 7 days  Metroptolol succinate 25 mg bid po D/c ASA  Demand ischemia Elevated troponin on admission No further workup per cardiology  Moderate MR TEE in November of 2024 showed moderate MR.  Continue to monitor outpatient.  Hypokalemia Replace as needed Monitor BMP  Depression Home sertraline 100 mg daily resumed   Hyperlipidemia Home pravastatin 10 mg daily resumed   Leukocytosis Likely infection + steroids  Treat per above   Protein-calorie malnutrition, severe Suspect moderate to severe, query severe COPD cachexia   Benign essential hypertension Meds as above    Goals of care Palliative care consult   amiodarone (PACERONE) tablet 400 mg  400 mg, Oral, 2 times daily, First dose on Sat 08/26/23 at 1215, For 7 days Followed By amiodarone (PACERONE) tablet 200 mg  200 mg, Oral, 2 times daily, First dose on Sat  09/02/23 at 1000, For 7 days Followed By amiodarone (PACERONE) tablet 200 mg  200 mg, Oral, Daily, First dose on Sat 09/09/23 at 1000   underweight based on BMI: Body mass index is 18.47 kg/m.  Underweight - under 18  overweight - 25 to 29 obese - 30 or  more Class 1 obesity: BMI of 30.0 to 34 Class 2 obesity: BMI of 35.0 to 39 Class 3 obesity: BMI of 40.0 to 49 Super Morbid Obesity: BMI 50-59 Super-super Morbid Obesity: BMI 60+ Significantly low or high BMI is associated with higher medical risk.  Weight management advised as adjunct to other disease management and risk reduction treatments    DVT prophylaxis: eliquis IV fluids: none Nutrition: cardiac diet  Central lines / other devices: none  Code Status: DNR ACP documentation reviewed: none on file in [facility]  TOC needs: home health PT/OT Medical barriers to dispo: O2 requirement, await home BiPap

## 2023-08-21 NOTE — H&P (Signed)
 History and Physical   Taylor Chambers ZOX:[id number] DOB: 10-29-1945 DOA: 08/21/2023  PCP: Mick Sell, MD  Patient coming from: home via EMS  I have personally briefly reviewed patient's old medical records in [facility] EMR.  Chief Concern: respiratory distress  HPI: [name] is a 78 year old female with history of COPD, hyperlipidemia, heart failure preserved ejection fraction, hypertension, depression, who presents emergency department for chief concerns of respiratory distress via EMS.  Per ED nursing documentation, patient was on CPAP on arrival and has labored breathing.    Patient was transitioned to BiPAP and per EDP, patient is currently requiring AVAP BiPAP setting.  Initial vitals in ED showed temperature of 98.4, respiration rate initially 30, improved to 23, heart rate 115 improved to 94, blood pressure 183/100 currently 127/72, SpO2 of 91% on CPAP therapy, currently 100% on BiPAP therapy with AVAPS setting.  Serum sodium is 141, potassium 5.2, chloride 108, bicarb 20, BUN of 32, serum creatinine 0.83, EGFR greater than 60, nonfasting blood glucose 327, WBC 29.4, hemoglobin 13.3, platelets of 630.  ALT was 49, AST is within normal limits.  COVID/Wednesday/influenza B/RSV PCR were negative.  BNP elevated at 887.9.-C troponin is 24.  Lactic acid is 3.2 and on repeat was 3.0.  Blood cultures x 2 are in process.  Chest x-ray favored multifocal pneumonia.  ED treatment: Doxycycline 100 mg IV one-time dose, ceftriaxone 2 g IV one-time dose, LR 1 L bolus, followed by LR infusion at 150 mL/h, Solu-Medrol one-time dose, and morphine 2 mg IV one-time dose.  Albuterol nebulizer one-time treatment.  DuoNebs one-time treatment. ------------------------------ At bedside, patient able to tell me her name, age, location, current calendar year.  She denies known sick contacts.  She reports she quit smoking in 2023.  She reports her husband is still smoking.  At  bedside, her husband does endorse that he smokes inside the house.  Extensive counseling has been given for him to stop smoking inside the house and to stop smoking completely. She denies fever, chills, chest pain, dysuria, hematuria, nausea, vomiting.  She reports that shortness of breath started over the last few days and has worsened today prompting her to call EMS.  Family reports that oxygen desatted to 87% on room air.  She reports coughing and is nonproductive.  She reports she cannot get stuff out.  Social history: She was home with her husband.  She is a former tobacco user quitting in 2023.  She denies EtOH and recreational drug use.  She is retired.  ROS: Constitutional: no weight change, no fever ENT/Mouth: no sore throat, no rhinorrhea Eyes: no eye pain, no vision changes Cardiovascular: no chest pain, + dyspnea,  no edema, no palpitations Respiratory: + cough, no sputum, no wheezing Gastrointestinal: no nausea, no vomiting, no diarrhea, no constipation Genitourinary: no urinary incontinence, no dysuria, no hematuria Musculoskeletal: no arthralgias, no myalgias Skin: no skin lesions, no pruritus, Neuro: + weakness, no loss of consciousness, no syncope Psych: no anxiety, no depression, + decrease appetite Heme/Lymph: no bruising, no bleeding  ED Course: Discussed with EDP, patient requiring hospitalization for chief concerns of COPD exacerbation with pneumonia.  Assessment/Plan  Principal Problem:   Severe sepsis with acute organ dysfunction (HCC) Active Problems:   COPD exacerbation (HCC)   Tobacco use disorder   Benign essential hypertension   Protein-calorie malnutrition, severe (HCC)   Leukocytosis   A-fib (HCC)   SOB (shortness of breath)   COPD (chronic obstructive pulmonary disease) (HCC)  Acute on chronic respiratory failure with hypoxia and hypercapnia (HCC)   Hyperlipidemia   Depression   Assessment and Plan:  * Severe sepsis with acute organ  dysfunction [facility]) Patient had increased respiration, heart rate, organ involvement is pulmonary and/or cardiac as patient is requiring BiPAP therapy and has elevated high sensitive troponin Maintain goal MAP greater than 65 Lactic acid was 3.2 and improved to 3.0 Status post LR 1 L bolus per EDP On admission order sodium chloride bolus 500 mL liter IV one-time dose Sodium chloride infusion at 150 mL/h, 1 day ordered Blood cultures x 2 are in process Continue with ceftriaxone 2 g IV daily to complete a 5-day course As patient has recently been prescribed azithromycin, agree with doxycycline 100 mg IV twice daily, to complete a 5-day course Status post Solu-Medrol 125 mg IV one-time dose, continue Solu-Medrol 40 mg IV one-time for 3/18 Admit to stepdown, inpatient  COPD exacerbation (HCC) DuoNebs 4 times daily, 4 doses ordered on admission Maintain SpO2 greater than 92% Continue BiPAP therapy at this time Solu-Medrol 40 mg IV daily, one-time dose ordered on admission for 08/22/2023  Depression Home sertraline 100 mg daily resumed  Hyperlipidemia Home pravastatin 10 mg daily resumed  SOB (shortness of breath) Presumed secondary to COPD exacerbation secondary to multilobar pneumonia, treatment per above  A-fib (HCC) Home Eliquis 5 mg p.o. twice daily  Leukocytosis Treat per above, recheck CBC in a.m.  Protein-calorie malnutrition, severe (HCC) Suspect moderate to severe, query severe COPD cachexia  Benign essential hypertension Hydralazine 5 mg IV every 6 hours as needed for SBP greater 165, 5 days ordered  AM team to complete med reconciliation  Chart reviewed.   DVT prophylaxis: Apixaban 5 mg p.o. twice daily Code Status: Allow natural death, DO NOT RESUSCITATE, DO NOT INTUBATE.  Husband, daughter in law and son-in-law were at bedside Diet: Heart healthy diet Family Communication: Updated husband, son, daughter-in-law at bedside with patient's permission Disposition Plan:  Pending clinical course, guarded prognosis Consults called: RT Admission status: Stepdown, inpatient initially as patient was on BiPAP therapy.  Patient is now off BiPAP therapy and on room air.  Bed changed to telemetry cardiac, inpatient  Past Medical History:  Diagnosis Date   Allergies    Anxiety    Aortic atherosclerosis (HCC)    Bipolar affective disorder (HCC)    COPD (chronic obstructive pulmonary disease) (HCC)    Coronary artery calcification seen on CT scan    Depression    Diastolic dysfunction 02/24/2020   a.) TTE 02/24/2020: EF >55%; triv PR, mild TR, mod MR; G1DD.   DOE (dyspnea on exertion)    Emphysema lung (HCC)    History of cataract    HLD (hyperlipidemia)    Hypertension    Macular degeneration    Migraines    Osteoporosis    Pneumonia    Tobacco use    Past Surgical History:  Procedure Laterality Date   CATARACT EXTRACTION     COLONOSCOPY N/A 08/30/2021   Procedure: COLONOSCOPY;  Surgeon: Jaynie Collins, DO;  Location: [facility] ENDOSCOPY;  Service: Gastroenterology;  Laterality: N/A;   DILATION AND CURETTAGE OF UTERUS     TEE WITHOUT CARDIOVERSION N/A 04/20/2023   Procedure: TRANSESOPHAGEAL ECHOCARDIOGRAM;  Surgeon: Antonieta Iba, MD;  Location: [facility] ORS;  Service: Cardiovascular;  Laterality: N/A;   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 Her smoking use included cigarettes. She started smoking about 51 years ago. She has a 12.5 pack-year smoking history. She has never used smokeless tobacco. She reports that she does not drink alcohol and does not use drugs.  Allergies  Allergen Reactions   Bupropion Other (See Comments)    Foggy brain   Family History  Problem Relation Age of Onset    Breast cancer Mother 7   Heart Problems Maternal Uncle    Heart Problems Paternal Aunt    Heart Problems Paternal Uncle    Family history: Family history reviewed and not pertinent.  Prior to Admission medications   Medication Sig Start Date End Date Taking? Authorizing Provider  albuterol (VENTOLIN HFA) 108 (90 Base) MCG/ACT inhaler Inhale 2 puffs into the lungs every 6 (six) hours as needed for wheezing or shortness of breath. 06/22/23   Claybon Jabs, MD  amiodarone (PACERONE) 200 MG tablet Take 2 tabs twice a day to start. On 1/28 start taking 1 tab twice a day. On 2/4 start taking one tab once a day 06/28/23   Wouk, Wilfred Curtis, MD  apixaban (ELIQUIS) 5 MG TABS tablet Take 1 tablet (5 mg total) by mouth 2 (two) times daily. 12/13/22   Iran Ouch, MD  aspirin EC 81 MG tablet Take by mouth.    [provider]  azithromycin (ZITHROMAX) 250 MG tablet Take 2 tablets (500mg ) by mouth on Day 1. Take 1 tablet (250mg ) by mouth on Days 2-5. 08/17/23 08/22/23  [provider]  Calcium Carbonate (CALCIUM 500 PO) Take 500 mg by mouth daily at 12 noon.    [provider]  Cholecalciferol (VITAMIN D3) 1.25 MG (50000 UT) CAPS Take 5,000 Units by mouth daily in the afternoon.    [provider]  dexamethasone (DECADRON) 4 MG tablet Take by mouth. 08/17/23 08/26/23  [provider]  Dupilumab 300 MG/2ML SOAJ Inject 300 mg into the skin every 14 (fourteen) days. Saturday 05/26/23   [provider]  EPINEPHrine 0.3 mg/0.3 mL IJ SOAJ injection Inject 0.3 mg into the muscle as needed for anaphylaxis. 05/26/23   [provider]  furosemide (LASIX) 20 MG tablet Take 1 tablet (20 mg total) by mouth ONCE or TWICE daily (total daily dose maximum 40 mg) as needed for up to 3 days for increased leg swelling, shortness of breath while lying down flat on back, weight gain 5+ lbs over 1-2 days. Seek medical care if these symptoms are not improving with increased  dose. 07/03/23   Sunnie Nielsen, DO  ipratropium (ATROVENT) 0.06 % nasal spray Place into the nose. 07/12/23 07/11/24  [provider]  ipratropium-albuterol (DUONEB) 0.5-2.5 (3) MG/3ML SOLN Take 3 mLs by nebulization 2 (two) times daily. Mix with budesonide    [provider]  losartan (COZAAR) 100 MG tablet Take by mouth.    [provider]  metoprolol succinate (TOPROL-XL) 50 MG 24 hr tablet Take 0.5 tablets (25 mg total) by mouth in the morning and at bedtime. Take with or immediately following a meal. 08/18/23 12/16/23  Carlos Levering, NP  Multiple Vitamin (MULTI-VITAMIN) tablet Take 1 tablet by mouth daily.    [provider]  Multiple Vitamins-Minerals (PRESERVISION AREDS 2+MULTI VIT PO) Take 2 tablets by mouth daily.    [provider]  OHTUVAYRE 3 MG/2.5ML SUSP Take  3 mg by nebulization daily. 05/31/23   [provider]  omeprazole (PRILOSEC) 40 MG capsule Take 40 mg by mouth daily as needed (Heartburn). 03/31/22 06/24/23  [provider]  potassium chloride (KLOR-CON) 20 MEQ packet Take 20 mEq by mouth daily on days Lasix is taken 07/03/23   Sunnie Nielsen, DO  potassium chloride SA (KLOR-CON M) 20 MEQ tablet Take 20 mEq by mouth daily. 07/03/23   [provider]  pravastatin (PRAVACHOL) 10 MG tablet Take 10 mg by mouth daily.    [provider]  sertraline (ZOLOFT) 100 MG tablet Take 1 tablet (100 mg total) by mouth daily. 07/03/23 07/02/24  Sunnie Nielsen, DO  sulfamethoxazole-trimethoprim (BACTRIM) 400-80 MG tablet Take 1 tablet by mouth 3 (three) times a week. Monday, Wednesday and Friday 03/06/23   [provider]   Physical Exam: Vitals:   08/21/23 1433 08/21/23 1500 08/21/23 1530 08/21/23 1600  BP:  134/73 (!) 159/124 (!) 140/71  Pulse:  97 (!) 113 88  Resp:  (!) 29 (!) 25 (!) 25  Temp: 98.5 F (36.9 C)   98.7 F (37.1 C)  TempSrc: Oral   Oral  SpO2:    99%  Weight:      Height:        Constitutional: appears older than chronological age, frail, cachectic Eyes: PERRL, lids and conjunctivae normal ENMT: Mucous membranes are dry. Posterior pharynx clear of any exudate or lesions. Age-appropriate dentition. Hearing appropriate Neck: normal, supple, no masses, no thyromegaly Respiratory: Bilevel positive pressure lung sounds, + diffuse end expiratory wheezing, no crackles.  Mild increased respiratory effort.  Mild use accessory muscle use.  Cardiovascular: Regular rate and rhythm, no murmurs / rubs / gallops. No extremity edema. 2+ pedal pulses. No carotid bruits.  Abdomen: no tenderness, no masses palpated, no hepatosplenomegaly. Bowel sounds positive.  Musculoskeletal: no clubbing / cyanosis. No joint deformity upper and lower extremities. Good ROM, no contractures, no atrophy. Normal muscle tone.  Skin: no rashes, lesions, ulcers. No induration Neurologic: Sensation intact. Strength 5/5 in all 4.  Psychiatric: Normal judgment and insight. Alert and oriented x 3.  Depressed mood.   EKG: independently reviewed, showing sinus tachycardia with rate of 109, QTc 470  Chest x-ray on Admission: I personally reviewed and I agree with radiologist reading as below.  DG Chest Portable 1 View Result Date: 08/21/2023 CLINICAL DATA:  Shortness of breath. Respiratory distress. History of COPD. EXAM: PORTABLE CHEST 1 VIEW COMPARISON:  07/02/2023. FINDINGS: Bilateral lungs appear hyperexpanded and hyperlucent with coarse bronchovascular markings, in keeping with COPD. There are superimposed diffuse alveolar and interstitial opacities overlying the left upper mid lung zone, upper portion of left lower lung zone and right upper lung zone, compatible with multilobar pneumonia. Follow-up to clearing is recommended. Bilateral lateral costophrenic angles are clear. No large pleural effusion or pneumothorax. Normal cardio-mediastinal silhouette. No acute osseous abnormalities. The soft tissues are  within normal limits. IMPRESSION: *Findings favor multilobar pneumonia, as described above. Follow-up to clearing is recommended. *Underlying COPD. Electronically Signed   By: Jules Schick M.D.   On: 08/21/2023 09:31   Labs on Admission: I have personally reviewed following labs  CBC: Recent Labs  Lab 08/21/23 0908  WBC 29.4*  NEUTROABS 22.5*  HGB 13.3  HCT 41.8  MCV 107.5*  PLT 630*   Basic Metabolic Panel: Recent Labs  Lab 08/21/23 0908  NA 141  K 5.2*  CL 108  CO2 20*  GLUCOSE 327*  BUN 32*  CREATININE  0.83  CALCIUM 8.7*   GFR: Estimated Creatinine Clearance: 41.7 mL/min (by C-G formula based on SCr of 0.83 mg/dL).  Liver Function Tests: Recent Labs  Lab 08/21/23 0908  AST 38  ALT 49*  ALKPHOS 75  BILITOT 0.7  PROT 6.3*  ALBUMIN 3.0*   Coagulation Profile: Recent Labs  Lab 08/21/23 0908  INR 1.2   CBG: Recent Labs  Lab 08/21/23 1608  GLUCAP 151*   Urine analysis:    Component Value Date/Time   COLORURINE YELLOW (A) 06/29/2023 1448   APPEARANCEUR CLOUDY (A) 06/29/2023 1448   LABSPEC 1.029 06/29/2023 1448   PHURINE 5.0 06/29/2023 1448   GLUCOSEU NEGATIVE 06/29/2023 1448   HGBUR NEGATIVE 06/29/2023 1448   BILIRUBINUR NEGATIVE 06/29/2023 1448   KETONESUR NEGATIVE 06/29/2023 1448   PROTEINUR >=300 (A) 06/29/2023 1448   NITRITE NEGATIVE 06/29/2023 1448   LEUKOCYTESUR NEGATIVE 06/29/2023 1448   CRITICAL CARE Performed by: Dr. Sedalia Muta  Total critical care time: 32 minutes  Critical care time was exclusive of separately billable procedures and treating other patients.  Critical care was necessary to treat or prevent imminent or life-threatening deterioration.  Critical care was time spent personally by me on the following activities: development of treatment plan with patient, as nursing, discussions with consultants, evaluation of patient's response to treatment, examination of patient, obtaining history from patient or surrogate, ordering and  performing treatments and interventions, ordering and review of laboratory studies, ordering and review of radiographic studies, pulse oximetry and re-evaluation of patient's condition.  This document was prepared using Dragon Voice Recognition software and may include unintentional dictation errors.  Dr. Sedalia Muta [facility]  If 7PM-7AM, please contact overnight-coverage provider If 7AM-7PM, please contact day attending provider [email or web address]  08/21/2023, 6:06 PM

## 2023-08-21 NOTE — Assessment & Plan Note (Signed)
Home sertraline 100 mg daily resumed

## 2023-08-21 NOTE — Sepsis Progress Note (Signed)
Notified provider of need to order blood cultures.  

## 2023-08-21 NOTE — Sepsis Progress Note (Signed)
 Code sepsis called @ 1050, pt given antibiotics @ 0924 before blood cultures ordered

## 2023-08-21 NOTE — Assessment & Plan Note (Signed)
 Home Eliquis 5 mg p.o. twice daily

## 2023-08-21 NOTE — Assessment & Plan Note (Signed)
 DuoNebs 4 times daily, 4 doses ordered on admission Maintain SpO2 greater than 92% Continue BiPAP therapy at this time Solu-Medrol 40 mg IV daily, one-time dose ordered on admission for 08/22/2023

## 2023-08-21 NOTE — Assessment & Plan Note (Addendum)
 Hydralazine 5 mg IV every 6 hours as needed for SBP greater 165, 5 days ordered

## 2023-08-21 NOTE — Progress Notes (Signed)
 CODE SEPSIS - PHARMACY COMMUNICATION  **Broad-spectrum antimicrobials should be administered within one hour of sepsis diagnosis**  Time Code Sepsis call or page was received: 1049  Antibiotics ordered: Ceftriaxone, Doxycycline  Time of first antibiotic administration: 0924  Additional action taken by pharmacy: N/A  If necessary, name of provider/nurse contacted: N/A    Will M. Dareen Piano, PharmD Clinical Pharmacist 08/21/2023 11:04 AM

## 2023-08-21 NOTE — ED Triage Notes (Signed)
 Pt here via [facility] from home with resp distress, pt on c-pap on arrival and has labored breathing. Pt states HX of COPD and emphysema. Pt appears in distress. Pt denies pain.

## 2023-08-21 NOTE — Assessment & Plan Note (Signed)
 Treat per above, recheck CBC in a.m.

## 2023-08-21 NOTE — ED Provider Notes (Signed)
 Taylor Chambers Provider Note    Event Date/Time   First MD Initiated Contact with Patient 08/21/23 [id number]     (approximate)   History   Respiratory Distress   HPI  Taylor Chambers is a 78 y.o. female history of COPD on 3 L nasal cannula at baseline, CHF, CAD, here with shortness of breath.  Per EMS patient was having some shortness of breath, cough, currently being treated for pneumonia on antibiotics, they found her satting 87% on her home oxygen.  She has been taking some DuoNebs prior to that, they gave her 1 DuoNeb.  Given her work of breathing, they put her on a CPAP with some improvement to the shortness of breath.  Patient denies any chest pain.  No leg swelling.  Does have a cough that is worse than her usual but no fever.  No nausea, vomiting, diarrhea.  On independent chart review, she was seen by cardiologist in mid March, has history of nonobstructive CAD, has heart failure with improved ejection fraction, last echo was done in January that showed EF of 55 to 60%, on Lasix.  Does have history of hypertension, history of paroxysmal A-fib, a flutter on Eliquis.     Physical Exam   Triage Vital Signs: ED Triage Vitals  Encounter Vitals Group     BP      Systolic BP Percentile      Diastolic BP Percentile      Pulse      Resp      Temp      Temp src      SpO2      Weight      Height      Head Circumference      Peak Flow      Pain Score      Pain Loc      Pain Education      Exclude from Growth Chart     Most recent vital signs: Vitals:   08/21/23 1200 08/21/23 1230  BP: 125/66 127/72  Pulse: 95 94  Resp: (!) 28 (!) 23  Temp:    SpO2:  100%     General: Awake, no distress.  CV:  Good peripheral perfusion.  Resp:  Normal effort.  Tachypnea, sparse diffuse wheezing with diminished breath sounds in the bases Abd:  No distention.  Soft nontender Other:  No lower extremity edema.   ED Results / Procedures / Treatments    Labs (all labs ordered are listed, but only abnormal results are displayed) Labs Reviewed  COMPREHENSIVE METABOLIC PANEL - Abnormal; Notable for the following components:      Result Value   Potassium 5.2 (*)    CO2 20 (*)    Glucose, Bld 327 (*)    BUN 32 (*)    Calcium 8.7 (*)    Total Protein 6.3 (*)    Albumin 3.0 (*)    ALT 49 (*)    All other components within normal limits  BRAIN NATRIURETIC PEPTIDE - Abnormal; Notable for the following components:   B Natriuretic Peptide 887.9 (*)    All other components within normal limits  CBC WITH DIFFERENTIAL/PLATELET - Abnormal; Notable for the following components:   WBC 29.4 (*)    MCV 107.5 (*)    MCH 34.2 (*)    Platelets 630 (*)    Neutro Abs 22.5 (*)    Monocytes Absolute 3.0 (*)    Abs Immature Granulocytes 1.43 (*)  All other components within normal limits  LACTIC ACID, PLASMA - Abnormal; Notable for the following components:   Lactic Acid, Venous 3.2 (*)    All other components within normal limits  LACTIC ACID, PLASMA - Abnormal; Notable for the following components:   Lactic Acid, Venous 3.0 (*)    All other components within normal limits  BLOOD GAS, VENOUS - Abnormal; Notable for the following components:   pO2, Ven 59 (*)    Acid-base deficit 3.1 (*)    All other components within normal limits  TROPONIN I (HIGH SENSITIVITY) - Abnormal; Notable for the following components:   Troponin I (High Sensitivity) 24 (*)    All other components within normal limits  TROPONIN I (HIGH SENSITIVITY) - Abnormal; Notable for the following components:   Troponin I (High Sensitivity) 51 (*)    All other components within normal limits  RESP PANEL BY RT-PCR (RSV, FLU A&B, COVID)  [id number]  CULTURE, BLOOD (ROUTINE X 2)  CULTURE, BLOOD (ROUTINE X 2)  PROTIME-INR     EKG  Sinus tachycardia, rate 109, normal QRS, normal QTc, baseline is wandering but no obvious ST elevation, T wave flattening in 1, not significant change  compared to prior   RADIOLOGY Chest x-ray on my interpretation showed multiple opacities bilaterally.  Concerning for multifocal pneumonia.   PROCEDURES:  Critical Care performed: Yes, see critical care procedure note(s)  .Critical Care  Performed by: Claybon Jabs, MD Authorized by: Claybon Jabs, MD   Critical care provider statement:    Critical care time (minutes):  35   Critical care was necessary to treat or prevent imminent or life-threatening deterioration of the following conditions:  Respiratory failure   Critical care was time spent personally by me on the following activities:  Development of treatment plan with patient or surrogate, discussions with consultants, evaluation of patient's response to treatment, examination of patient, ordering and review of laboratory studies, ordering and review of radiographic studies, ordering and performing treatments and interventions, pulse oximetry, re-evaluation of patient's condition and review of old charts    MEDICATIONS ORDERED IN ED: Medications  ipratropium-albuterol (DUONEB) 0.5-2.5 (3) MG/3ML nebulizer solution 6 mL (6 mLs Nebulization Given 08/21/23 0924)  methylPREDNISolone sodium succinate (SOLU-MEDROL) 125 mg/2 mL injection 125 mg (125 mg Intravenous Given 08/21/23 0923)  cefTRIAXone (ROCEPHIN) 1 g in sodium chloride 0.9 % 100 mL IVPB (0 g Intravenous Stopped 08/21/23 0954)  doxycycline (VIBRAMYCIN) 100 mg in sodium chloride 0.9 % 250 mL IVPB (0 mg Intravenous Stopped 08/21/23 1129)  morphine (PF) 2 MG/ML injection 2 mg (2 mg Intravenous Given 08/21/23 1012)  albuterol (PROVENTIL) (2.5 MG/3ML) 0.083% nebulizer solution (10 mg/hr Nebulization Given 08/21/23 1109)  lactated ringers bolus 1,000 mL (1,000 mLs Intravenous New Bag/Given 08/21/23 1108)     IMPRESSION / MDM / ASSESSMENT AND PLAN / ED COURSE  I reviewed the triage vital signs and the nursing notes.                              Differential diagnosis includes, but is  not limited to, COPD exacerbation, ACS, considered CHF but she has no swelling, viral illness, pneumonia.  Will get labs, EKG, troponin, BNP, chest x-ray.  Respiratory viral panel.  Will give her 2 more DuoNebs, transition her to BiPAP, Solu-Medrol, will give her IV ceftriaxone and doxycycline for her pneumonia.  She will need to be admitted for further management.  Patient's presentation  is most consistent with acute presentation with potential threat to life or bodily function.  On reassessment patient is still tachypneic on BiPAP, transition to her to AVAPS per respiratory recommendation.  She is doing a lot better on the AVAPS.  Independent review of labs, she has a leukocytosis, respiratory viral panel is negative, troponin is mildly elevated, lactate is elevated, will add blood cultures and some fluids.  She is ready go on antibiotics.  Her creatinine is normal, her potassium is mildly elevated, sodium is normal.  Given her overall risk, she will need to be admitted for further management.  Consulted hospitalist who will admit the patient.  Clinical Course as of 08/21/23 1253  Mon Aug 21, 2023  0939 DG Chest Portable 1 View IMPRESSION: *Findings favor multilobar pneumonia, as described above. Follow-up to clearing is recommended. *Underlying COPD.   [TT]    Clinical Course User Index [TT] Jodie Echevaria, Franchot Erichsen, MD     FINAL CLINICAL IMPRESSION(S) / ED DIAGNOSES   Final diagnoses:  COPD exacerbation (HCC)  Multifocal pneumonia     Rx / DC Orders   ED Discharge Orders     None        Note:  This document was prepared using Dragon voice recognition software and may include unintentional dictation errors.    Claybon Jabs, MD 08/21/23 1254

## 2023-08-22 DIAGNOSIS — I4821 Permanent atrial fibrillation: Secondary | ICD-10-CM

## 2023-08-22 DIAGNOSIS — J9621 Acute and chronic respiratory failure with hypoxia: Secondary | ICD-10-CM

## 2023-08-22 DIAGNOSIS — J441 Chronic obstructive pulmonary disease with (acute) exacerbation: Secondary | ICD-10-CM | POA: Diagnosis not present

## 2023-08-22 DIAGNOSIS — A419 Sepsis, unspecified organism: Secondary | ICD-10-CM | POA: Diagnosis not present

## 2023-08-22 DIAGNOSIS — Z515 Encounter for palliative care: Secondary | ICD-10-CM | POA: Diagnosis not present

## 2023-08-22 DIAGNOSIS — J9622 Acute and chronic respiratory failure with hypercapnia: Secondary | ICD-10-CM

## 2023-08-22 LAB — BASIC METABOLIC PANEL
Anion gap: 6 (ref 5–15)
BUN: 21 mg/dL (ref 8–23)
CO2: 24 mmol/L (ref 22–32)
Calcium: 7.8 mg/dL — ABNORMAL LOW (ref 8.9–10.3)
Chloride: 112 mmol/L — ABNORMAL HIGH (ref 98–111)
Creatinine, Ser: 0.64 mg/dL (ref 0.44–1.00)
GFR, Estimated: >60 mL/min (ref >60)
Glucose, Bld: 87 mg/dL (ref 70–99)
Potassium: 3.6 mmol/L (ref 3.5–5.1)
Sodium: 142 mmol/L (ref 135–145)

## 2023-08-22 LAB — CBC
HCT: 33.9 % — ABNORMAL LOW (ref 36.0–46.0)
Hemoglobin: 10.7 g/dL — ABNORMAL LOW (ref 12.0–15.0)
MCH: 33.8 pg (ref 26.0–34.0)
MCHC: 31.6 g/dL (ref 30.0–36.0)
MCV: 106.9 fL — ABNORMAL HIGH (ref 80.0–100.0)
Platelets: 421 10*3/uL — ABNORMAL HIGH (ref 150–400)
RBC: 3.17 MIL/uL — ABNORMAL LOW (ref 3.87–5.11)
RDW: 13.9 % (ref 11.5–15.5)
WBC: 24.9 10*3/uL — ABNORMAL HIGH (ref 4.0–10.5)
nRBC: 0.2 % (ref 0.0–0.2)

## 2023-08-22 LAB — LACTIC ACID, PLASMA
Lactic Acid, Venous: 2.2 mmol/L (ref 0.5–1.9)
Lactic Acid, Venous: 1.2 mmol/L (ref 0.5–1.9)

## 2023-08-22 LAB — CBG MONITORING, ED
Glucose-Capillary: 82 mg/dL (ref 70–99)
Glucose-Capillary: 97 mg/dL (ref 70–99)

## 2023-08-22 LAB — PROTIME-INR
INR: 1.5 — ABNORMAL HIGH (ref 0.8–1.2)
Prothrombin Time: 18 s — ABNORMAL HIGH (ref 11.4–15.2)

## 2023-08-22 LAB — CORTISOL-AM, BLOOD: Cortisol - AM: 2.9 ug/dL — ABNORMAL LOW (ref 6.7–22.6)

## 2023-08-22 LAB — GLUCOSE, CAPILLARY
Glucose-Capillary: 218 mg/dL — ABNORMAL HIGH (ref 70–99)
Glucose-Capillary: 89 mg/dL (ref 70–99)

## 2023-08-22 MED ORDER — AMIODARONE HCL IN DEXTROSE 360-4.14 MG/200ML-% IV SOLN
60.0000 mg/h | INTRAVENOUS | Status: AC
Start: 2023-08-23 — End: 2023-08-23
  Administered 2023-08-22: 60 mg/h via INTRAVENOUS
  Filled 2023-08-22 (×2): qty 200

## 2023-08-22 MED ORDER — AMIODARONE IV BOLUS ONLY 150 MG/100ML
150.0000 mg | Freq: Once | INTRAVENOUS | Status: AC
  Administered 2023-08-22: 150 mg via INTRAVENOUS
  Filled 2023-08-22: qty 100

## 2023-08-22 MED ORDER — DILTIAZEM HCL-DEXTROSE 125-5 MG/125ML-% IV SOLN (PREMIX)
5.0000 mg/h | INTRAVENOUS | Status: DC
  Filled 2023-08-22: qty 125

## 2023-08-22 MED ORDER — AMIODARONE HCL IN DEXTROSE 360-4.14 MG/200ML-% IV SOLN
30.0000 mg/h | INTRAVENOUS | Status: DC
Start: 2023-08-23 — End: 2023-08-26
  Administered 2023-08-23 – 2023-08-26 (×7): 30 mg/h via INTRAVENOUS
  Filled 2023-08-22 (×6): qty 200

## 2023-08-22 MED ORDER — IPRATROPIUM-ALBUTEROL 0.5-2.5 (3) MG/3ML IN SOLN
3.0000 mL | Freq: Three times a day (TID) | RESPIRATORY_TRACT | Status: DC
  Administered 2023-08-22 – 2023-08-23 (×2): 3 mL via RESPIRATORY_TRACT
  Filled 2023-08-22: qty 3

## 2023-08-22 MED ORDER — DEXTROSE 5 % IV SOLN
5.0000 mg/h | INTRAVENOUS | Status: DC
  Administered 2023-08-22: 5 mg/h via INTRAVENOUS
  Filled 2023-08-22: qty 100

## 2023-08-22 MED ORDER — DILTIAZEM LOAD VIA INFUSION
10.0000 mg | Freq: Once | INTRAVENOUS | Status: AC
  Administered 2023-08-22: 10 mg via INTRAVENOUS
  Filled 2023-08-22: qty 10

## 2023-08-22 NOTE — Progress Notes (Signed)
 1      PROGRESS NOTE    Taylor Chambers  BJY:[id number] DOB: 1945-06-16 DOA: 08/21/2023 PCP: Mick Sell, MD    Brief Narrative:   78 year old female with history of COPD, hyperlipidemia, heart failure preserved ejection fraction, hypertension, depression, who presents emergency department for chief concerns of respiratory distress via EMS. Per ED nursing documentation, patient was on CPAP on arrival and has labored breathing. Patient was transitioned to BiPAP and per EDP, patient is currently requiring 4 L oxygen via nasal cannula   Initial vitals in ED showed temperature of 98.4, respiration rate initially 30, improved to 23, heart rate 115 improved to 94, blood pressure 183/100 currently 127/72, SpO2 of 91% on CPAP therapy, followed by 100% on BiPAP therapy with AVAPS setting. Serum sodium is 141, potassium 5.2, chloride 108, bicarb 20, BUN of 32, serum creatinine 0.83, EGFR greater than 60, nonfasting blood glucose 327, WBC 29.4, hemoglobin 13.3, platelets of 630. ALT was 49, AST is within normal limits. COVID/Wednesday/influenza B/RSV PCR were negative.   BNP elevated at 887.9.-C troponin is 24.  Lactic acid is 3.2 and on repeat was 3.0.  Blood cultures x 2 are in process. Chest x-ray favored multifocal pneumonia.  3/18: Palliative care consult   Assessment & Plan:   Principal Problem:   Severe sepsis with acute organ dysfunction (HCC) Active Problems:   COPD exacerbation (HCC)   Tobacco use disorder   Benign essential hypertension   Protein-calorie malnutrition, severe (HCC)   Leukocytosis   A-fib (HCC)   SOB (shortness of breath)   COPD (chronic obstructive pulmonary disease) (HCC)   Acute on chronic respiratory failure with hypoxia and hypercapnia (HCC)   Hyperlipidemia   Depression   * Severe sepsis with acute organ dysfunction [facility]) Patient had increased respiration, heart rate, organ involvement is pulmonary and/or cardiac as patient is requiring BiPAP therapy  and has elevated high sensitive troponin Maintain goal MAP greater than 65 Lactic acid was 3.2 and improved to 3.0.  Continue trending Status post LR 1 L bolus per EDP Continue IV fluids Blood cultures x 2 are in process Continue with ceftriaxone 2 g IV daily to complete a 5-day course As patient has recently been prescribed azithromycin, start doxycycline 100 mg IV twice daily, to complete a 5-day course Status post Solu-Medrol 125 mg IV one-time dose, continue Solu-Medrol 40 mg IV one-time for 3/18   COPD exacerbation (HCC) DuoNebs 4 times daily, 4 doses ordered on admission Maintain SpO2 greater than 92% Continue BiPAP as needed.  Currently on 4 L oxygen via nasal cannula Solu-Medrol 40 mg IV daily, one-time dose ordered on admission for 08/22/2023   Depression Home sertraline 100 mg daily resumed   Hyperlipidemia Home pravastatin 10 mg daily resumed   SOB (shortness of breath) Presumed secondary to COPD exacerbation secondary to multilobar pneumonia, treatment per above   A-fib (HCC) Home Eliquis 5 mg p.o. twice daily   Leukocytosis Treat per above, recheck CBC in a.m.   Protein-calorie malnutrition, severe (HCC) Suspect moderate to severe, query severe COPD cachexia   Benign essential hypertension Hydralazine 5 mg IV every 6 hours as needed for SBP greater 165, 5 days ordered  Goals of care Palliative care consult   DVT prophylaxis: Eliquis Place TED hose Start: 08/21/23 1253 apixaban (ELIQUIS) tablet 5 mg     Code Status: DNR Family Communication: Husband updated at bedside Disposition Plan: Possible discharge in next 2 to 3 days depending on clinical condition   Consultants:  Palliative care    Antimicrobials:  Rocephin Doxycycline   Subjective:  Patient is still short of breath and coughing.  Hypoxia improving.  Now transition to 4 L oxygen via nasal cannula from BiPAP earlier.  Husband at bedside  Objective: Vitals:   08/22/23 0721 08/22/23  1000 08/22/23 1250 08/22/23 1330  BP:  (!) 152/89  (!) 137/106  Pulse:  100  89  Resp:  (!) 26  20  Temp: (!) 97.5 F (36.4 C)  97.7 F (36.5 C)   TempSrc: Oral  Oral   SpO2:  100%  96%  Weight:      Height:       No intake or output data in the 24 hours ending 08/22/23 1500 Filed Weights   08/21/23 0900  Weight: 45.8 kg    Examination:  General exam: Appears calm and comfortable  Respiratory system: Bilateral expiratory wheezing.  Using accessory muscles of respiration.  Rhonchi at the bases Cardiovascular system: S1 & S2 heard, RRR. No murmurs Gastrointestinal system: Abdomen is soft benign Central nervous system: Alert and oriented. No focal neurological deficits. Extremities: Symmetric 5 x 5 power. Skin: No rashes, lesions or ulcers Psychiatry: Judgement and insight appear normal. Mood & affect appropriate.     Data Reviewed: I have personally reviewed following labs and imaging studies  CBC: Recent Labs  Lab 08/21/23 0908 08/22/23 0717  WBC 29.4* 24.9*  NEUTROABS 22.5*  --   HGB 13.3 10.7*  HCT 41.8 33.9*  MCV 107.5* 106.9*  PLT 630* 421*   Basic Metabolic Panel: Recent Labs  Lab 08/21/23 0908 08/22/23 0717  NA 141 142  K 5.2* 3.6  CL 108 112*  CO2 20* 24  GLUCOSE 327* 87  BUN 32* 21  CREATININE 0.83 0.64  CALCIUM 8.7* 7.8*   GFR: Estimated Creatinine Clearance: 43.3 mL/min (by C-G formula based on SCr of 0.64 mg/dL). Liver Function Tests: Recent Labs  Lab 08/21/23 0908  AST 38  ALT 49*  ALKPHOS 75  BILITOT 0.7  PROT 6.3*  ALBUMIN 3.0*   No results for input(s): "LIPASE", "AMYLASE" in the last 168 hours. No results for input(s): "AMMONIA" in the last 168 hours. Coagulation Profile: Recent Labs  Lab 08/21/23 0908 08/22/23 0717  INR 1.2 1.5*   Cardiac Enzymes: No results for input(s): "CKTOTAL", "CKMB", "CKMBINDEX", "TROPONINI" in the last 168 hours. BNP (last 3 results) No results for input(s): "PROBNP" in the last 8760  hours. HbA1C: No results for input(s): "HGBA1C" in the last 72 hours. CBG: Recent Labs  Lab 08/21/23 1608 08/21/23 2246 08/22/23 0730 08/22/23 1228  GLUCAP 151* 115* 82 97   Lipid Profile: No results for input(s): "CHOL", "HDL", "LDLCALC", "TRIG", "CHOLHDL", "LDLDIRECT" in the last 72 hours. Thyroid Function Tests: No results for input(s): "TSH", "T4TOTAL", "FREET4", "T3FREE", "THYROIDAB" in the last 72 hours. Anemia Panel: No results for input(s): "VITAMINB12", "FOLATE", "FERRITIN", "TIBC", "IRON", "RETICCTPCT" in the last 72 hours. Sepsis Labs: Recent Labs  Lab 08/21/23 0908 08/21/23 1136  LATICACIDVEN 3.2* 3.0*    Recent Results (from the past 240 hours)  Resp panel by RT-PCR (RSV, Flu A&B, Covid) Anterior Nasal Swab     Status: None   Collection Time: 08/21/23  9:08 AM   Specimen: Anterior Nasal Swab  Result Value Ref Range Status   SARS Coronavirus 2 by RT PCR NEGATIVE NEGATIVE Final    Comment: (NOTE) SARS-CoV-2 target nucleic acids are NOT DETECTED.  The SARS-CoV-2 RNA is generally detectable in upper respiratory specimens  during the acute phase of infection. The lowest concentration of SARS-CoV-2 viral copies this assay can detect is 138 copies/mL. A negative result does not preclude SARS-Cov-2 infection and should not be used as the sole basis for treatment or other patient management decisions. A negative result may occur with  improper specimen collection/handling, submission of specimen other than nasopharyngeal swab, presence of viral mutation(s) within the areas targeted by this assay, and inadequate number of viral copies(<138 copies/mL). A negative result must be combined with clinical observations, patient history, and epidemiological information. The expected result is Negative.  Fact Sheet for Patients:  BloggerCourse.com  Fact Sheet for Healthcare Providers:  SeriousBroker.it  This test is no t  yet approved or cleared by the [place] FDA and  has been authorized for detection and/or diagnosis of SARS-CoV-2 by FDA under an Emergency Use Authorization (EUA). This EUA will remain  in effect (meaning this test can be used) for the duration of the COVID-19 declaration under Section 564(b)(1) of the Act, 21 U.S.C.section 360bbb-3(b)(1), unless the authorization is terminated  or revoked sooner.       Influenza A by PCR NEGATIVE NEGATIVE Final   Influenza B by PCR NEGATIVE NEGATIVE Final    Comment: (NOTE) The Xpert Xpress SARS-CoV-2/FLU/RSV plus assay is intended as an aid in the diagnosis of influenza from Nasopharyngeal swab specimens and should not be used as a sole basis for treatment. Nasal washings and aspirates are unacceptable for Xpert Xpress SARS-CoV-2/FLU/RSV testing.  Fact Sheet for Patients: BloggerCourse.com  Fact Sheet for Healthcare Providers: SeriousBroker.it  This test is not yet approved or cleared by the [place] FDA and has been authorized for detection and/or diagnosis of SARS-CoV-2 by FDA under an Emergency Use Authorization (EUA). This EUA will remain in effect (meaning this test can be used) for the duration of the COVID-19 declaration under Section 564(b)(1) of the Act, 21 U.S.C. section 360bbb-3(b)(1), unless the authorization is terminated or revoked.     Resp Syncytial Virus by PCR NEGATIVE NEGATIVE Final    Comment: (NOTE) Fact Sheet for Patients: BloggerCourse.com  Fact Sheet for Healthcare Providers: SeriousBroker.it  This test is not yet approved or cleared by the [place] FDA and has been authorized for detection and/or diagnosis of SARS-CoV-2 by FDA under an Emergency Use Authorization (EUA). This EUA will remain in effect (meaning this test can be used) for the duration of the COVID-19 declaration under Section 564(b)(1)  of the Act, 21 U.S.C. section 360bbb-3(b)(1), unless the authorization is terminated or revoked.  Performed at [facility], [address] [place], [place] [address]   Culture, blood (routine x 2)     Status: None (Preliminary result)   Collection Time: 08/21/23 11:36 AM   Specimen: BLOOD RIGHT ARM  Result Value Ref Range Status   Specimen Description BLOOD RIGHT ARM  Final   Special Requests   Final    BOTTLES DRAWN AEROBIC AND ANAEROBIC Blood Culture results may not be optimal due to an inadequate volume of blood received in culture bottles   Culture   Final    NO GROWTH < 24 HOURS Performed at [facility], [address], [place], [place] [address]    Report Status PENDING  Incomplete  Culture, blood (routine x 2)     Status: None (Preliminary result)   Collection Time: 08/21/23 11:36 AM   Specimen: BLOOD LEFT ARM  Result Value Ref Range Status   Specimen Description BLOOD LEFT ARM  Final  Special Requests   Final    BOTTLES DRAWN AEROBIC AND ANAEROBIC Blood Culture results may not be optimal due to an inadequate volume of blood received in culture bottles   Culture   Final    NO GROWTH < 24 HOURS Performed at [facility], [address] [place], [place] [address]    Report Status PENDING  Incomplete         Radiology Studies: DG Chest Portable 1 View Result Date: 08/21/2023 CLINICAL DATA:  Shortness of breath. Respiratory distress. History of COPD. EXAM: PORTABLE CHEST 1 VIEW COMPARISON:  07/02/2023. FINDINGS: Bilateral lungs appear hyperexpanded and hyperlucent with coarse bronchovascular markings, in keeping with COPD. There are superimposed diffuse alveolar and interstitial opacities overlying the left upper mid lung zone, upper portion of left lower lung zone and right upper lung zone, compatible with multilobar pneumonia. Follow-up to clearing is recommended. Bilateral lateral costophrenic angles are clear. No large pleural effusion  or pneumothorax. Normal cardio-mediastinal silhouette. No acute osseous abnormalities. The soft tissues are within normal limits. IMPRESSION: *Findings favor multilobar pneumonia, as described above. Follow-up to clearing is recommended. *Underlying COPD. Electronically Signed   By: Jules Schick M.D.   On: 08/21/2023 09:31        Scheduled Meds:  apixaban  5 mg Oral BID   aspirin EC  81 mg Oral Daily   cholecalciferol  5,000 Units Oral Q1500   insulin aspart  0-5 Units Subcutaneous QHS   insulin aspart  0-9 Units Subcutaneous TID WC   losartan  100 mg Oral Daily   metoprolol succinate  25 mg Oral BID   multivitamin with minerals  1 tablet Oral Daily   pravastatin  10 mg Oral q1800   sertraline  100 mg Oral Daily   Continuous Infusions:  cefTRIAXone (ROCEPHIN)  IV Stopped (08/22/23 1023)   doxycycline (VIBRAMYCIN) IV Stopped (08/22/23 1158)     LOS: 1 day    Time spent: 35 minutes    Horris Speros Sherryll Burger, MD [facility] Pager [phone]  If 7PM-7AM, please contact night-coverage [email or web address]  08/22/2023, 3:00 PM

## 2023-08-22 NOTE — ED Notes (Signed)
 Given breakfast tray, Pt tolerated well

## 2023-08-22 NOTE — Consult Note (Signed)
 Consultation Note Date: 08/22/2023 at 1045  Patient Name: Taylor Chambers  DOB: [birth year]  MRN: [id number]  Age / Sex: 78 y.o., female  PCP: Mick Sell, MD Referring Physician: Delfino Lovett, MD  HPI/Patient Profile: 78 y.o. female  with past medical history of COPD with chronic respiratory failure (2-3 L [place] at baseline), OSA (CPAP), CAD, former tobacco use, A-fib, HFpEF, HTN, and HLD admitted on 08/21/2023 with shortness of breath.    Of note, patient was admitted 1/18 - 06/28/2023 for AECOPD, A-fib with RVR, and CHF exacerbation and returned the next day 1/23 was discharged on 1/27 for acute on chronic respiratory failure with hypoxia and hypercapnia.  Patient is being treated for severe sepsis with acute organ dysfunction and AECOPD.  PMT was consulted to support patient and family with goals of care discussions.  Of note, patient is familiar to our service as my colleague met with her during her 1/23 - 07/03/2023 admission.  Clinical Assessment and Goals of Care: Extensive chart review completed prior to meeting patient including labs, vital signs, imaging, progress notes, orders, and available advanced directive documents from current and previous encounters. I then met with patient and her husband in the ED to discuss diagnosis prognosis, GOC, EOL wishes, disposition and options.  Patient is currently on BiPAP but awake, alert, and oriented x 4.  I re-introduced Palliative Medicine as specialized medical care for people living with serious illness. It focuses on providing relief from the symptoms and stress of a serious illness. The goal is to improve quality of life for both the patient and the family.  Patient and her husband recall meeting my colleague Ladona Ridgel during previous hospitalization.  We discussed a brief life review of the patient.  They have been married for over 60 years.  They share  that they enjoy spending time with her family.  However, her recent health issues have been quite challenging.  They believe she has not really fully recovered since her hospitalization in January.  We discussed patient's current illness-suspected pneumonia and COPD exacerbation-and what it means in the larger context of patient's on-going co-morbidities-chronic respiratory failure.  Patient and spouse have a clear understanding of patient's current medical status.   I attempted to elicit values and goals of care important to the patient.  Patient shares she is hopeful she can come off of the BiPAP soon.  She shares that she was told earlier that this is not pneumonia but she is curious to know what continues to cause her problems.  Counseled with patient and spouse that COPD is a chronic, progressive, and irreversible disease that is exacerbated by acute illnesses.  The goal of therapy is to minimums patient's symptoms/suffering and to elongate time between exacerbations as much as possible.  Advance directives, concepts specific to code status, artificial feeding and hydration, and rehospitalization were considered and discussed.  Patient confirmed her spouse is her next of kin decision maker.  In the event of a cardiopulmonary arrest, patient would want to allow a natural  passing.  She would never want to except resuscitative efforts or artificial means to sustain her life.  DNR with limited interventions remains.  Plan is to admit patient and continue to treat for severe sepsis and acute COPD exacerbation.  Patient shares she wants to feel better but knows that this may be a slow process.  Discussed taking it one day, even at times one hour, at a time.   Discussed with patient/family the importance of continued conversation with family and the medical providers regarding overall plan of care and treatment options, ensuring decisions are within the context of the patient's values and GOCs.     Questions and concerns were addressed. The family was encouraged to call with questions or concerns.   PMT will continue to follow and support patient and family throughout her hospitalization.  Primary Decision Maker PATIENT  Physical Exam Vitals reviewed.  Constitutional:      General: She is not in acute distress.    Appearance: She is normal weight.  HENT:     Head: Normocephalic.     Nose: Nose normal.     Mouth/Throat:     Mouth: Mucous membranes are moist.  Eyes:     Pupils: Pupils are equal, round, and reactive to light.  Cardiovascular:     Pulses: Normal pulses.  Pulmonary:     Comments: Bipap for tachypenia Abdominal:     Palpations: Abdomen is soft.  Skin:    General: Skin is warm and dry.  Neurological:     Mental Status: She is alert and oriented to person, place, and time.  Psychiatric:        Mood and Affect: Mood normal.        Behavior: Behavior normal.        Thought Content: Thought content normal.     Palliative Assessment/Data: 60-70%     Thank you for this consult. Palliative medicine will continue to follow and assist holistically.   Time Total: 75 minutes  Time spent includes: Detailed review of medical records (labs, imaging, vital signs), medically appropriate exam (mental status, respiratory, cardiac, skin), discussed with treatment team, counseling and educating patient, family and staff, documenting clinical information, medication management and coordination of care.  Signed by: Georgiann Cocker, DNP, FNP-BC Palliative Medicine   Please contact Palliative Medicine Team providers via [facility] for questions and concerns.

## 2023-08-22 NOTE — ED Notes (Addendum)
 Pt was on bipap since 3pm yesterday, downgraded Pt to 4L [place]- which is her baseline O2. Pt is sating fine and RR is WNL. Pt given scheduled breathing tx. VS obtained.

## 2023-08-22 NOTE — ED Notes (Signed)
 MD notified of downgrade to 4L [place] face to face

## 2023-08-22 NOTE — ED Notes (Signed)
 Pullled Pt up in bed. Pts breathing is even and unlabored, downgraded Pt again to 4L [place] at this time. Pt is sating @99 %, RR 20-25

## 2023-08-22 NOTE — ED Notes (Signed)
 RT @bedside , Pt still in bipap at this time

## 2023-08-22 NOTE — ED Notes (Signed)
 When rolling Pt to put on bedpan, Pt became increasingly SOB and tachypneic in the 30-35s. Placed back on bipap at this time.

## 2023-08-22 NOTE — ED Notes (Signed)
 Pt was placed back on bipap after gettting on bed pan, RR 30-40s, satting 90-98%

## 2023-08-23 ENCOUNTER — Inpatient Hospital Stay

## 2023-08-23 DIAGNOSIS — I5033 Acute on chronic diastolic (congestive) heart failure: Secondary | ICD-10-CM

## 2023-08-23 DIAGNOSIS — R6520 Severe sepsis without septic shock: Secondary | ICD-10-CM | POA: Diagnosis not present

## 2023-08-23 DIAGNOSIS — R0603 Acute respiratory distress: Secondary | ICD-10-CM

## 2023-08-23 DIAGNOSIS — A419 Sepsis, unspecified organism: Secondary | ICD-10-CM

## 2023-08-23 DIAGNOSIS — J441 Chronic obstructive pulmonary disease with (acute) exacerbation: Secondary | ICD-10-CM | POA: Diagnosis not present

## 2023-08-23 DIAGNOSIS — R7989 Other specified abnormal findings of blood chemistry: Secondary | ICD-10-CM | POA: Diagnosis not present

## 2023-08-23 DIAGNOSIS — R0602 Shortness of breath: Secondary | ICD-10-CM | POA: Diagnosis not present

## 2023-08-23 DIAGNOSIS — I4891 Unspecified atrial fibrillation: Secondary | ICD-10-CM | POA: Diagnosis not present

## 2023-08-23 DIAGNOSIS — I4821 Permanent atrial fibrillation: Secondary | ICD-10-CM | POA: Diagnosis not present

## 2023-08-23 DIAGNOSIS — J189 Pneumonia, unspecified organism: Secondary | ICD-10-CM

## 2023-08-23 LAB — BASIC METABOLIC PANEL
Anion gap: 8 (ref 5–15)
BUN: 17 mg/dL (ref 8–23)
CO2: 20 mmol/L — ABNORMAL LOW (ref 22–32)
Calcium: 8 mg/dL — ABNORMAL LOW (ref 8.9–10.3)
Chloride: 110 mmol/L (ref 98–111)
Creatinine, Ser: 0.55 mg/dL (ref 0.44–1.00)
GFR, Estimated: >60 mL/min (ref >60)
Glucose, Bld: 116 mg/dL — ABNORMAL HIGH (ref 70–99)
Potassium: 3.8 mmol/L (ref 3.5–5.1)
Sodium: 138 mmol/L (ref 135–145)

## 2023-08-23 LAB — PHOSPHORUS: Phosphorus: 2 mg/dL — ABNORMAL LOW (ref 2.5–4.6)

## 2023-08-23 LAB — RESPIRATORY PANEL BY PCR

## 2023-08-23 LAB — GLUCOSE, CAPILLARY
Glucose-Capillary: 98 mg/dL (ref 70–99)
Glucose-Capillary: 114 mg/dL — ABNORMAL HIGH (ref 70–99)
Glucose-Capillary: 156 mg/dL — ABNORMAL HIGH (ref 70–99)
Glucose-Capillary: 105 mg/dL — ABNORMAL HIGH (ref 70–99)

## 2023-08-23 LAB — CBC
HCT: 35.6 % — ABNORMAL LOW (ref 36.0–46.0)
Hemoglobin: 11.7 g/dL — ABNORMAL LOW (ref 12.0–15.0)
MCH: 33.8 pg (ref 26.0–34.0)
MCHC: 32.9 g/dL (ref 30.0–36.0)
MCV: 102.9 fL — ABNORMAL HIGH (ref 80.0–100.0)
Platelets: 467 10*3/uL — ABNORMAL HIGH (ref 150–400)
RBC: 3.46 MIL/uL — ABNORMAL LOW (ref 3.87–5.11)
RDW: 14.1 % (ref 11.5–15.5)
WBC: 26.7 10*3/uL — ABNORMAL HIGH (ref 4.0–10.5)
nRBC: 0.3 % — ABNORMAL HIGH (ref 0.0–0.2)

## 2023-08-23 LAB — MAGNESIUM: Magnesium: 2.1 mg/dL (ref 1.7–2.4)

## 2023-08-23 LAB — BRAIN NATRIURETIC PEPTIDE: B Natriuretic Peptide: 1377 pg/mL — ABNORMAL HIGH (ref 0.0–100.0)

## 2023-08-23 MED ORDER — LORAZEPAM 1 MG PO TABS: 1.0000 mg | ORAL_TABLET | Freq: Three times a day (TID) | ORAL | Status: DC | PRN

## 2023-08-23 MED ORDER — PREDNISONE 50 MG PO TABS
50.0000 mg | ORAL_TABLET | Freq: Every day | ORAL | Status: AC
  Administered 2023-08-24 – 2023-08-26 (×3): 50 mg via ORAL
  Filled 2023-08-23 (×3): qty 1

## 2023-08-23 MED ORDER — MORPHINE SULFATE (PF) 2 MG/ML IV SOLN
2.0000 mg | Freq: Once | INTRAVENOUS | Status: AC
  Administered 2023-08-23: 2 mg via INTRAVENOUS
  Filled 2023-08-23: qty 1

## 2023-08-23 MED ORDER — IPRATROPIUM-ALBUTEROL 0.5-2.5 (3) MG/3ML IN SOLN
3.0000 mL | Freq: Three times a day (TID) | RESPIRATORY_TRACT | Status: DC
  Administered 2023-08-23: 3 mL via RESPIRATORY_TRACT
  Filled 2023-08-23: qty 3

## 2023-08-23 MED ORDER — LEVALBUTEROL HCL 0.63 MG/3ML IN NEBU: 0.6300 mg | INHALATION_SOLUTION | Freq: Three times a day (TID) | RESPIRATORY_TRACT | Status: DC

## 2023-08-23 MED ORDER — ENSURE ENLIVE PO LIQD
237.0000 mL | Freq: Two times a day (BID) | ORAL | Status: DC
Start: 2023-08-23 — End: 2023-08-24
  Administered 2023-08-23 – 2023-08-24 (×3): 237 mL via ORAL

## 2023-08-23 MED ORDER — LEVALBUTEROL HCL 0.63 MG/3ML IN NEBU
0.6300 mg | INHALATION_SOLUTION | Freq: Three times a day (TID) | RESPIRATORY_TRACT | Status: DC
  Administered 2023-08-23 – 2023-08-28 (×14): 0.63 mg via RESPIRATORY_TRACT
  Filled 2023-08-23 (×14): qty 3

## 2023-08-23 MED ORDER — HALOPERIDOL LACTATE 5 MG/ML IJ SOLN
1.0000 mg | Freq: Four times a day (QID) | INTRAMUSCULAR | Status: DC | PRN
  Administered 2023-08-23: 2 mg via INTRAMUSCULAR
  Filled 2023-08-23: qty 1

## 2023-08-23 MED ORDER — METOPROLOL TARTRATE 5 MG/5ML IV SOLN
2.5000 mg | Freq: Once | INTRAVENOUS | Status: AC
  Administered 2023-08-23: 2.5 mg via INTRAVENOUS
  Filled 2023-08-23: qty 5

## 2023-08-23 MED ORDER — AMIODARONE IV BOLUS ONLY 150 MG/100ML
150.0000 mg | Freq: Once | INTRAVENOUS | Status: AC
  Administered 2023-08-23: 150 mg via INTRAVENOUS
  Filled 2023-08-23: qty 100

## 2023-08-23 MED ORDER — METOPROLOL TARTRATE 5 MG/5ML IV SOLN: 5.0000 mg | Freq: Once | INTRAVENOUS | Status: DC

## 2023-08-23 MED ORDER — FUROSEMIDE 10 MG/ML IJ SOLN
40.0000 mg | Freq: Once | INTRAMUSCULAR | Status: AC
  Administered 2023-08-23: 40 mg via INTRAVENOUS
  Filled 2023-08-23: qty 4

## 2023-08-23 MED ORDER — LORAZEPAM 2 MG/ML IJ SOLN
0.5000 mg | Freq: Once | INTRAMUSCULAR | Status: AC
  Administered 2023-08-23: 0.5 mg via INTRAVENOUS
  Filled 2023-08-23: qty 1

## 2023-08-23 MED ORDER — FUROSEMIDE 10 MG/ML IJ SOLN
40.0000 mg | Freq: Every day | INTRAMUSCULAR | Status: DC
  Administered 2023-08-24: 40 mg via INTRAVENOUS
  Filled 2023-08-23: qty 4

## 2023-08-23 NOTE — Progress Notes (Addendum)
#  Community Acquired Pneumonia #AECOPD #HFpEF Assessment: History: copd, HFpEFpulm dx,recent abx, Afib on Eliquis Clinical: RN report patient went into Afib with RVR rate>160's sustaining.hypoxia with increased oxygen requirement now  BiPAP.  Exam: reduced breath sounds, mild increased work of breathing Etiology/DDx: viral, bacterial, pneumonitis, AECOPD, CHF  Labs: CBC, BMP, Mg/Phos, TSH, LFTs, Coags, Troponin, BNP Imaging: EKG, chest X-ray for worsening heart failure  Treatment -Diuretics, afterload reduction for heart failure, BiPAP -Rate control: IV metoprolol (5 mg q66min x3) or diltiazem (0.25 mg/kg bolus ? infusion at 5-15 mg/hr) No improvement on Dilt. Amiodarone loading dose with infusion started rate control failed with Dilt. -Anticoagulation: Continue Eliquis -abx for pneumonia -Maintain electrolytes: Keep K >4 and Mg >2.   Webb Silversmith, DNP, CCRN, FNP-C, AGACNP-BC Acute Care & Family Nurse Practitioner  [facility]  See Amion for personal pager PCCM on call pager [phone] until 7 am

## 2023-08-23 NOTE — Progress Notes (Signed)
 PROGRESS NOTE    Taylor Chambers   FTD:[id number] DOB: May 01, 1946  DOA: 08/21/2023 Date of Service: 08/23/23 which is hospital day 2  PCP: Mick Sell, MD    Hospital course / significant events:   HPI: 78 year old female with history of COPD, hyperlipidemia, heart failure preserved ejection fraction, hypertension, depression, who presents emergency department for chief concerns of respiratory distress via EMS. Per ED nursing documentation, patient was on CPAP on arrival and has labored breathing.   Of note, patient was admitted 1/18 - 06/28/2023 for AECOPD, A-fib with RVR, and CHF exacerbation and returned the next day 1/23 was discharged on 1/27 for acute on chronic respiratory failure with hypoxia and hypercapnia.   03/17: to ED, admitted to hospitalist service. BiPap --. 4L [place]. Chest x-ray favored multifocal pneumonia, doxy initiated, given 1L bolus. Started steroids. Afib RVR, started  3/18: Palliative care consult. Continued IV fluids.  3/19: Overnight AfibRVR< started amiodarone gtt. HR remained elevated, Cardiology consult. 4L O2 --> back on BiPap, lasix. Converted to NSR later this afternoon.      Consultants:  Palliative care Cardiology   Procedures/Surgeries: none      ASSESSMENT & PLAN:   Severe sepsis with acute organ dysfunction  Question true sepsis d/t PNA vs SIRS d/t CHF/COPD Dyspnea  Treat underlying cause(s) as below Procalcitonin in AM - if low will d/c abx Repeat CXR in AM  CAP Continue ceftriaxone 2 g IV daily to complete a 5-day course As patient has recently been prescribed azithromycin, start doxycycline 100 mg IV twice daily, to complete a 5-day course Blood cultures x 2 are in process  COPD Status post Solu-Medrol 125 mg IV one-time dose, continue steroid burst w/ prednisone Supplemental O2  Scheduled and PRN DuoNebs  HFpEF Elevated BNP Recent Echo, EF 55-60, G2DD, mod-severe MR - will not repeat Echo  Lasix Strict I&O   Cardiology to follow   Depression Home sertraline 100 mg daily resumed   Hyperlipidemia Home pravastatin 10 mg daily resumed  A-fib Rapid  Eliquis 5 mg p.o. twice daily Amiodarone drip titrating - cardiology to follow as this was not working to control HR Metroptolol succinate 25 mg bid po   Leukocytosis Treat per above, recheck CBC in a.m.   Protein-calorie malnutrition, severe (HCC) Suspect moderate to severe, query severe COPD cachexia   Benign essential hypertension Hydralazine 5 mg IV every 6 hours as needed for SBP greater 165, 5 days ordered   Goals of care Palliative care consult    underweight based on BMI: Body mass index is 18.47 kg/m.  Underweight - under 18  overweight - 25 to 29 obese - 30 or more Class 1 obesity: BMI of 30.0 to 34 Class 2 obesity: BMI of 35.0 to 39 Class 3 obesity: BMI of 40.0 to 49 Super Morbid Obesity: BMI 50-59 Super-super Morbid Obesity: BMI 60+ Significantly low or high BMI is associated with higher medical risk.  Weight management advised as adjunct to other disease management and risk reduction treatments    DVT prophylaxis: eliquis IV fluids: stopped continuous IV fluids  Nutrition: cardiac diet  Central lines / other devices: none  Code Status: DNR ACP documentation reviewed: none on file in [facility]  TOC needs: TBD Medical barriers to dispo: increased O2 requirement, diuresing, cardiology to see. Expected medical readiness for discharge few more days.              Subjective / Brief ROS:  Patient reports SOB Denies CP Pain controlled.  Denies new weakness.  Tolerating diet.  Reports no concerns w/ urination/defecation.   Family Communication: family at bedside on rounds     Objective Findings:  Vitals:   08/23/23 1302 08/23/23 1400 08/23/23 1500 08/23/23 1701  BP: (!) 186/144 (!) 145/98  (!) 148/87  Pulse: (!) 152 79  86  Resp: (!) 28 (!) 30  (!) 23  Temp: 98 F (36.7 C)   97.7 F (36.5 C)   TempSrc: Oral   Oral  SpO2: 92% 97% 92%   Weight:      Height:        Intake/Output Summary (Last 24 hours) at 08/23/2023 1709 Last data filed at 08/23/2023 1416 Gross per 24 hour  Intake 1165.26 ml  Output 300 ml  Net 865.26 ml   Filed Weights   08/21/23 0900  Weight: 45.8 kg    Examination:  Physical Exam Constitutional:      General: She is not in acute distress.    Appearance: She is ill-appearing.  Cardiovascular:     Rate and Rhythm: Tachycardia present. Rhythm irregular.  Pulmonary:     Effort: Respiratory distress present.     Breath sounds: Normal breath sounds.  Abdominal:     Palpations: Abdomen is soft.  Musculoskeletal:     Right lower leg: No edema.     Left lower leg: No edema.  Neurological:     Mental Status: She is alert and oriented to person, place, and time. Mental status is at baseline.  Psychiatric:        Mood and Affect: Mood normal.        Behavior: Behavior normal.          Scheduled Medications:   apixaban  5 mg Oral BID   aspirin EC  81 mg Oral Daily   cholecalciferol  5,000 Units Oral Q1500   feeding supplement  237 mL Oral BID BM   [START ON 08/24/2023] furosemide  40 mg Intravenous Daily   insulin aspart  0-9 Units Subcutaneous TID WC   levalbuterol  0.63 mg Nebulization TID   losartan  100 mg Oral Daily   metoprolol succinate  25 mg Oral BID   multivitamin with minerals  1 tablet Oral Daily   pravastatin  10 mg Oral q1800   [START ON 08/24/2023] predniSONE  50 mg Oral Q breakfast   sertraline  100 mg Oral Daily    Continuous Infusions:  amiodarone 30 mg/hr (08/23/23 1707)   cefTRIAXone (ROCEPHIN)  IV 2 g (08/23/23 1110)   doxycycline (VIBRAMYCIN) IV 100 mg (08/23/23 0842)    PRN Medications:  acetaminophen **OR** acetaminophen, hydrALAZINE, ondansetron **OR** ondansetron (ZOFRAN) IV, pantoprazole, senna-docusate  Antimicrobials from admission:  Anti-infectives (From admission, onward)    Start     Dose/Rate Route  Frequency Ordered Stop   08/22/23 1000  cefTRIAXone (ROCEPHIN) 2 g in sodium chloride 0.9 % 100 mL IVPB        2 g 200 mL/hr over 30 Minutes Intravenous Every 24 hours 08/21/23 1254 08/26/23 0959   08/21/23 2100  doxycycline (VIBRAMYCIN) 100 mg in sodium chloride 0.9 % 250 mL IVPB        100 mg 125 mL/hr over 120 Minutes Intravenous Every 12 hours 08/21/23 1258 08/26/23 2059   08/21/23 1100  cefTRIAXone (ROCEPHIN) 2 g in sodium chloride 0.9 % 100 mL IVPB  Status:  Discontinued        2 g 200 mL/hr over 30 Minutes Intravenous Once 08/21/23 1049 08/21/23  1049   08/21/23 1100  azithromycin (ZITHROMAX) 500 mg in sodium chloride 0.9 % 250 mL IVPB  Status:  Discontinued        500 mg 250 mL/hr over 60 Minutes Intravenous  Once 08/21/23 1049 08/21/23 1049   08/21/23 0930  doxycycline (VIBRAMYCIN) 100 mg in sodium chloride 0.9 % 250 mL IVPB        100 mg 125 mL/hr over 120 Minutes Intravenous  Once 08/21/23 0857 08/21/23 1129   08/21/23 0900  cefTRIAXone (ROCEPHIN) 1 g in sodium chloride 0.9 % 100 mL IVPB        1 g 200 mL/hr over 30 Minutes Intravenous  Once 08/21/23 0857 08/21/23 4098           Data Reviewed:  I have personally reviewed the following...  CBC: Recent Labs  Lab 08/21/23 0908 08/22/23 0717 08/23/23 0245  WBC 29.4* 24.9* 26.7*  NEUTROABS 22.5*  --   --   HGB 13.3 10.7* 11.7*  HCT 41.8 33.9* 35.6*  MCV 107.5* 106.9* 102.9*  PLT 630* 421* 467*   Basic Metabolic Panel: Recent Labs  Lab 08/21/23 0908 08/22/23 0717 08/23/23 0245  NA 141 142 138  K 5.2* 3.6 3.8  CL 108 112* 110  CO2 20* 24 20*  GLUCOSE 327* 87 116*  BUN 32* 21 17  CREATININE 0.83 0.64 0.55  CALCIUM 8.7* 7.8* 8.0*  MG  --   --  2.1  PHOS  --   --  2.0*   GFR: Estimated Creatinine Clearance: 43.3 mL/min (by C-G formula based on SCr of 0.55 mg/dL). Liver Function Tests: Recent Labs  Lab 08/21/23 0908  AST 38  ALT 49*  ALKPHOS 75  BILITOT 0.7  PROT 6.3*  ALBUMIN 3.0*   No results  for input(s): "LIPASE", "AMYLASE" in the last 168 hours. No results for input(s): "AMMONIA" in the last 168 hours. Coagulation Profile: Recent Labs  Lab 08/21/23 0908 08/22/23 0717  INR 1.2 1.5*   Cardiac Enzymes: No results for input(s): "CKTOTAL", "CKMB", "CKMBINDEX", "TROPONINI" in the last 168 hours. BNP (last 3 results) No results for input(s): "PROBNP" in the last 8760 hours. HbA1C: No results for input(s): "HGBA1C" in the last 72 hours. CBG: Recent Labs  Lab 08/22/23 1228 08/22/23 1658 08/22/23 2127 08/23/23 0818 08/23/23 1301  GLUCAP 97 218* 89 98 114*   Lipid Profile: No results for input(s): "CHOL", "HDL", "LDLCALC", "TRIG", "CHOLHDL", "LDLDIRECT" in the last 72 hours. Thyroid Function Tests: No results for input(s): "TSH", "T4TOTAL", "FREET4", "T3FREE", "THYROIDAB" in the last 72 hours. Anemia Panel: No results for input(s): "VITAMINB12", "FOLATE", "FERRITIN", "TIBC", "IRON", "RETICCTPCT" in the last 72 hours. Most Recent Urinalysis On File:     Component Value Date/Time   COLORURINE YELLOW (A) 06/29/2023 1448   APPEARANCEUR CLOUDY (A) 06/29/2023 1448   LABSPEC 1.029 06/29/2023 1448   PHURINE 5.0 06/29/2023 1448   GLUCOSEU NEGATIVE 06/29/2023 1448   HGBUR NEGATIVE 06/29/2023 1448   BILIRUBINUR NEGATIVE 06/29/2023 1448   KETONESUR NEGATIVE 06/29/2023 1448   PROTEINUR >=300 (A) 06/29/2023 1448   NITRITE NEGATIVE 06/29/2023 1448   LEUKOCYTESUR NEGATIVE 06/29/2023 1448   Sepsis Labs: @LABRCNTIP (procalcitonin:4,lacticidven:4) Microbiology: Recent Results (from the past 240 hours)  Resp panel by RT-PCR (RSV, Flu A&B, Covid) Anterior Nasal Swab     Status: None   Collection Time: 08/21/23  9:08 AM   Specimen: Anterior Nasal Swab  Result Value Ref Range Status   SARS Coronavirus 2 by RT PCR NEGATIVE NEGATIVE Final  Comment: (NOTE) SARS-CoV-2 target nucleic acids are NOT DETECTED.  The SARS-CoV-2 RNA is generally detectable in upper respiratory specimens  during the acute phase of infection. The lowest concentration of SARS-CoV-2 viral copies this assay can detect is 138 copies/mL. A negative result does not preclude SARS-Cov-2 infection and should not be used as the sole basis for treatment or other patient management decisions. A negative result may occur with  improper specimen collection/handling, submission of specimen other than nasopharyngeal swab, presence of viral mutation(s) within the areas targeted by this assay, and inadequate number of viral copies(<138 copies/mL). A negative result must be combined with clinical observations, patient history, and epidemiological information. The expected result is Negative.  Fact Sheet for Patients:  BloggerCourse.com  Fact Sheet for Healthcare Providers:  SeriousBroker.it  This test is no t yet approved or cleared by the [place] FDA and  has been authorized for detection and/or diagnosis of SARS-CoV-2 by FDA under an Emergency Use Authorization (EUA). This EUA will remain  in effect (meaning this test can be used) for the duration of the COVID-19 declaration under Section 564(b)(1) of the Act, 21 U.S.C.section 360bbb-3(b)(1), unless the authorization is terminated  or revoked sooner.       Influenza A by PCR NEGATIVE NEGATIVE Final   Influenza B by PCR NEGATIVE NEGATIVE Final    Comment: (NOTE) The Xpert Xpress SARS-CoV-2/FLU/RSV plus assay is intended as an aid in the diagnosis of influenza from Nasopharyngeal swab specimens and should not be used as a sole basis for treatment. Nasal washings and aspirates are unacceptable for Xpert Xpress SARS-CoV-2/FLU/RSV testing.  Fact Sheet for Patients: BloggerCourse.com  Fact Sheet for Healthcare Providers: SeriousBroker.it  This test is not yet approved or cleared by the [place] FDA and has been authorized for detection  and/or diagnosis of SARS-CoV-2 by FDA under an Emergency Use Authorization (EUA). This EUA will remain in effect (meaning this test can be used) for the duration of the COVID-19 declaration under Section 564(b)(1) of the Act, 21 U.S.C. section 360bbb-3(b)(1), unless the authorization is terminated or revoked.     Resp Syncytial Virus by PCR NEGATIVE NEGATIVE Final    Comment: (NOTE) Fact Sheet for Patients: BloggerCourse.com  Fact Sheet for Healthcare Providers: SeriousBroker.it  This test is not yet approved or cleared by the [place] FDA and has been authorized for detection and/or diagnosis of SARS-CoV-2 by FDA under an Emergency Use Authorization (EUA). This EUA will remain in effect (meaning this test can be used) for the duration of the COVID-19 declaration under Section 564(b)(1) of the Act, 21 U.S.C. section 360bbb-3(b)(1), unless the authorization is terminated or revoked.  Performed at [facility], [address] [place], [place] [address]   Culture, blood (routine x 2)     Status: None (Preliminary result)   Collection Time: 08/21/23 11:36 AM   Specimen: BLOOD RIGHT ARM  Result Value Ref Range Status   Specimen Description BLOOD RIGHT ARM  Final   Special Requests   Final    BOTTLES DRAWN AEROBIC AND ANAEROBIC Blood Culture results may not be optimal due to an inadequate volume of blood received in culture bottles   Culture   Final    NO GROWTH 2 DAYS Performed at [facility], [address], [place], [place] [address]    Report Status PENDING  Incomplete  Culture, blood (routine x 2)     Status: None (Preliminary result)   Collection Time: 08/21/23 11:36 AM   Specimen:  BLOOD LEFT ARM  Result Value Ref Range Status   Specimen Description BLOOD LEFT ARM  Final   Special Requests   Final    BOTTLES DRAWN AEROBIC AND ANAEROBIC Blood Culture results may not be optimal due to an inadequate  volume of blood received in culture bottles   Culture   Final    NO GROWTH 2 DAYS Performed at [facility], [address], [place], [place] [address]    Report Status PENDING  Incomplete  Respiratory (~20 pathogens) panel by PCR     Status: None   Collection Time: 08/23/23  4:54 AM   Specimen: Nasopharyngeal Swab; Respiratory  Result Value Ref Range Status   Adenovirus NOT DETECTED NOT DETECTED Final   Coronavirus 229E NOT DETECTED NOT DETECTED Final    Comment: (NOTE) The Coronavirus on the Respiratory Panel, DOES NOT test for the novel  Coronavirus (2019 nCoV)    Coronavirus HKU1 NOT DETECTED NOT DETECTED Final   Coronavirus NL63 NOT DETECTED NOT DETECTED Final   Coronavirus OC43 NOT DETECTED NOT DETECTED Final   Metapneumovirus NOT DETECTED NOT DETECTED Final   Rhinovirus / Enterovirus NOT DETECTED NOT DETECTED Final   Influenza A NOT DETECTED NOT DETECTED Final   Influenza B NOT DETECTED NOT DETECTED Final   Parainfluenza Virus 1 NOT DETECTED NOT DETECTED Final   Parainfluenza Virus 2 NOT DETECTED NOT DETECTED Final   Parainfluenza Virus 3 NOT DETECTED NOT DETECTED Final   Parainfluenza Virus 4 NOT DETECTED NOT DETECTED Final   Respiratory Syncytial Virus NOT DETECTED NOT DETECTED Final   Bordetella pertussis NOT DETECTED NOT DETECTED Final   Bordetella Parapertussis NOT DETECTED NOT DETECTED Final   Chlamydophila pneumoniae NOT DETECTED NOT DETECTED Final   Mycoplasma pneumoniae NOT DETECTED NOT DETECTED Final    Comment: Performed at [facility], [address], [place], [place] [address]      Radiology Studies last 3 days: DG Chest 1 View Result Date: 08/23/2023 CLINICAL DATA:  78 year old female with shortness of breath. Emphysema, pneumonia. EXAM: CHEST  1 VIEW COMPARISON:  Portable chest 08/21/2023 and earlier. FINDINGS: Portable AP upright view at 0409 hours. Stable large lung volumes. Normal cardiac size and mediastinal contours. Visualized  tracheal air column is within normal limits. Coarse and nodular left greater than right upper lobe predominant airspace opacity has not significantly changed. No superimposed pneumothorax. No overt consolidation at this time. Small pleural effusions demonstrated by CT in January may persist. Stable visualized osseous structures.  Paucity of bowel gas. IMPRESSION: Emphysema ([id number]) with left greater than right lung Pneumonia not significantly changed from 08/21/2023. Small pleural effusions suspected. Electronically Signed   By: Odessa Fleming M.D.   On: 08/23/2023 04:47   DG Chest Portable 1 View Result Date: 08/21/2023 CLINICAL DATA:  Shortness of breath. Respiratory distress. History of COPD. EXAM: PORTABLE CHEST 1 VIEW COMPARISON:  07/02/2023. FINDINGS: Bilateral lungs appear hyperexpanded and hyperlucent with coarse bronchovascular markings, in keeping with COPD. There are superimposed diffuse alveolar and interstitial opacities overlying the left upper mid lung zone, upper portion of left lower lung zone and right upper lung zone, compatible with multilobar pneumonia. Follow-up to clearing is recommended. Bilateral lateral costophrenic angles are clear. No large pleural effusion or pneumothorax. Normal cardio-mediastinal silhouette. No acute osseous abnormalities. The soft tissues are within normal limits. IMPRESSION: *Findings favor multilobar pneumonia, as described above. Follow-up to clearing is recommended. *Underlying COPD. Electronically Signed   By: Jules Schick M.D.   On:  08/21/2023 09:31        Sunnie Nielsen, DO [facility] 08/23/2023, 5:09 PM    Dictation software may have been used to generate the above note. Typos may occur and escape review in typed/dictated notes. Please contact Dr Lyn Hollingshead directly for clarity if needed.  Staff may message me via secure chat in Epic  but this may not receive an immediate response,  please page me for urgent matters!  If  7PM-7AM, please contact night coverage [email or web address]

## 2023-08-23 NOTE — Plan of Care (Signed)
  Problem: Education: Goal: Ability to describe self-care measures that may prevent or decrease complications (Diabetes Survival Skills Education) will improve Outcome: Progressing   Problem: Health Behavior/Discharge Planning: Goal: Ability to identify and utilize available resources and services will improve Outcome: Progressing   Problem: Education: Goal: Knowledge of General Education information will improve Description: Including pain rating scale, medication(s)/side effects and non-pharmacologic comfort measures Outcome: Progressing   Problem: Elimination: Goal: Will not experience complications related to bowel motility Outcome: Progressing

## 2023-08-23 NOTE — Progress Notes (Signed)
 Palliative Care Progress Note, Assessment & Plan   Patient Name: Taylor Chambers       Date: 08/23/2023 DOB: [birth year]  Age: 78 y.o. MRN#: [id number] Attending Physician: Sunnie Nielsen, DO Primary Care Physician: Mick Sell, MD Admit Date: 08/21/2023  Subjective: Patient is lying in bed with BiPAP in place.  Respiratory therapist has just placed patient back on BiPAP after she was off for approximately 2 hours.  Attending Dr. Lyn Hollingshead, respiratory therapist, and patient's niece are at bedside during my visit.    HPI: 78 y.o. female  with past medical history of COPD with chronic respiratory failure (2-3 L [place] at baseline), OSA (CPAP), CAD, former tobacco use, A-fib, HFpEF, HTN, and HLD admitted on 08/21/2023 with shortness of breath.     Of note, patient was admitted 1/18 - 06/28/2023 for AECOPD, A-fib with RVR, and CHF exacerbation and returned the next day 1/23 was discharged on 1/27 for acute on chronic respiratory failure with hypoxia and hypercapnia.   Patient is being treated for severe sepsis with acute organ dysfunction and AECOPD.   PMT was consulted to support patient and family with goals of care discussions.  Of note, patient is familiar to our service as my colleague met with her during her 1/23 - 07/03/2023 admission.  Summary of counseling/coordination of care: Extensive chart review completed prior to meeting patient including labs, vital signs, imaging, progress notes, orders, and available advanced directive documents from current and previous encounters.   After reviewing the patient's chart and assessing the patient at bedside, I spoke with patient in regards to symptom management and goals of care.   She shares she feels okay.  She has increased work of breathing on the  BiPAP.  Niece shares that patient is talking too much to other people and that has contributed to her shortness of breath.  Encouraged patient not to speak in order to let her lungs rest.  She nodded her head in agreement.  I again highlighted that patient has made her goals and wishes clear.  She remains in agreement with DNR and limited interventions, never be accepting of ventilatory or artificial life support.  I discussed treating the treatable and doing what we can to support patient where she is.  She nodded her head in agreement.  Plan remains to continue to remove fluid and support patient's respiratory system.  Cardiology consulted and following closely.  Space and opportunity provided for patient and niece to share their thoughts and emotions regarding patient's current medical situation.  Niece shares that she has no questions at this time.  She was appreciative of my visit and support.  No adjustment to plan of care at this time.  PMT will continue to follow and support patient throughout her hospitalization.  Physical Exam Vitals reviewed.  Constitutional:      General: She is in acute distress.     Appearance: She is not ill-appearing or toxic-appearing.  HENT:     Head: Normocephalic.  Eyes:     Pupils: Pupils are equal, round, and reactive to light.  Cardiovascular:     Rate and Rhythm: Tachycardia present.  Pulmonary:     Effort: Respiratory distress present.  Abdominal:     Palpations: Abdomen is soft.  Skin:    General: Skin is warm and dry.     Coloration: Skin is pale.  Neurological:     Mental Status: She is alert and oriented to person, place, and time.  Psychiatric:        Mood and Affect: Mood normal.        Behavior: Behavior normal.        Judgment: Judgment normal.             Total Time 25 minutes   Time spent includes: Detailed review of medical records (labs, imaging, vital signs), medically appropriate exam (mental status, respiratory, cardiac,  skin), discussed with treatment team, counseling and educating patient, family and staff, documenting clinical information, medication management and coordination of care.  Samara Deist L. Bonita Quin, DNP, FNP-BC Palliative Medicine Team

## 2023-08-23 NOTE — Consult Note (Signed)
 Cardiology Consultation   Patient ID: [name] MRN: [id number]; DOB: [birth year]  Admit date: 08/21/2023 Date of Consult: 08/23/2023  PCP:  Mick Sell, MD   [facility] Providers Cardiologist:  Lorine Bears, MD   {  Patient Profile:   Taylor Chambers is a 78 y.o. female with a hx of nonobstructive CAD, HFimpEF, mitral valve regurgitation, Paroxysmal Aflutter, HTN, HLD, COPD, tobacco use who is being seen 08/23/2023 for the evaluation of paroxysmal Afib at the request of Dr. Lyn Hollingshead.  History of Present Illness:   Taylor Chambers was previously seen by Dr. Juliann Pares.  She established care with Dr. Kirke Corin in July 2024.  Echo in September 2021 showed EF greater than 55%, no wall motion abnormality, grade 1 diastolic dysfunction, normal RV function, moderate MR, mild to moderate TR.  Nuclear stress test in September 2021 showed no evidence of ischemia with an EF of 64% echo in August 2023 showed EF of 45% with moderate MR.  Coronary CTA November 2023 showed a calcium score of 95.  Patient was admitted to the hospital in April 2024 with COPD exacerbation and A-fib RVR.  She converted to sinus rhythm with IV Dilt.  She was discharged on anticoagulation.  Upon establishing care with our office in July 2024 she noted occasional exertional chest pain and shortness of breath infrequent palpitations.  She was in normal sinus rhythm with PACs.  Heart monitor showed predominantly normal rhythm with 1% A-fib burden.  Echo in August 2024 showed EF of 55 to 60%, moderate to severe MR with mid to late systolic prolapse of multiple segments of the anterior leaflet of the mitral valve, mild to moderate TR.  She underwent TEE in November 2024 which showed an EF of 60 to 65%, no LA/LAA thrombus, moderate MR, aortic atherosclerosis, no intra-atrial shunt.  The patient was admitted in twice January 2025 the first for Acute hypoxic respiratory failure, new Aflutter RVR with h/ Afib, elevated  troponin, 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, COPD exacerbation, CAP. She converted to NSR on IV amiodarone and was transitioned to oral amiodarone. Echo showed LVEF 55-60^, moderately elevated pulmonary artery systolic pressure, mod to severe MR, mild MT.  She was seen in the office 08/2023 and was in NSR on amiodarone. She still felt poorly due to the PNA.   The patient was admitted 3/17 with respiratory distress via EMS. She required CPAP on arrival. In the ER BP 183/100, RR 30, afebrile, HR 115, 100% on Bipap. Labs showed sodium 141, K5.2, chl 108, Scr 0.83, BUN 32, BG 327, WBC 29, Hgb 13.3, plt 630. COVID/Flu/RSV negative. BNP 887. HS trop 24. LA 3.2>3. CXR showed multifocal PNA. She was given IVF and started on IV abx and given duonebs.   Patient was initially in NSR/ST, but later converted to rapid Afib. She was started on IV amiodarone with no improvement in rates. Cardiology was asked to see. She converted to NSR 3/19 around 2pm.    Past Medical History:  Diagnosis Date   Allergies    Anxiety    Aortic atherosclerosis (HCC)    Bipolar affective disorder (HCC)    COPD (chronic obstructive pulmonary disease) (HCC)    Coronary artery calcification seen on CT scan    Depression    Diastolic dysfunction 02/24/2020   a.) TTE 02/24/2020: EF >55%; triv PR, mild TR, mod MR; G1DD.   DOE (dyspnea on exertion)    Emphysema lung (HCC)    History of cataract  HLD (hyperlipidemia)    Hypertension    Macular degeneration    Migraines    Osteoporosis    Pneumonia    Tobacco use     Past Surgical History:  Procedure Laterality Date   CATARACT EXTRACTION     COLONOSCOPY N/A 08/30/2021   Procedure: COLONOSCOPY;  Surgeon: Jaynie Collins, DO;  Location: [facility] ENDOSCOPY;  Service: Gastroenterology;  Laterality: N/A;   DILATION AND CURETTAGE OF UTERUS     TEE WITHOUT CARDIOVERSION N/A 04/20/2023   Procedure: TRANSESOPHAGEAL ECHOCARDIOGRAM;  Surgeon: Antonieta Iba, MD;  Location: [facility] ORS;   Service: Cardiovascular;  Laterality: N/A;   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   Procedure: TOTAL HIP ARTHROPLASTY;  Surgeon: Donato Heinz, MD;  Location: [facility] ORS;  Service: Orthopedics;  Laterality: Right;   WISDOM TOOTH EXTRACTION     WRIST GANGLION EXCISION       Home Medications:  Prior to Admission medications   Medication Sig Start Date End Date Taking? Authorizing Provider  albuterol (VENTOLIN HFA) 108 (90 Base) MCG/ACT inhaler Inhale 2 puffs into the lungs every 6 (six) hours as needed for wheezing or shortness of breath. 06/22/23  Yes Claybon Jabs, MD  amiodarone (PACERONE) 200 MG tablet Take 2 tabs twice a day to start. On 1/28 start taking 1 tab twice a day. On 2/4 start taking one tab once a day 06/28/23  Yes Wouk, Wilfred Curtis, MD  apixaban (ELIQUIS) 5 MG TABS tablet Take 1 tablet (5 mg total) by mouth 2 (two) times daily. 12/13/22  Yes Iran Ouch, MD  aspirin EC 81 MG tablet Take by mouth.   Yes [provider]  Calcium Carbonate (CALCIUM 500 PO) Take 500 mg by mouth daily at 12 noon.   Yes [provider]  Cholecalciferol (VITAMIN D3) 1.25 MG (50000 UT) CAPS Take 5,000 Units by mouth daily in the afternoon.   Yes [provider]  dexamethasone (DECADRON) 4 MG tablet Take by mouth. 08/17/23 08/26/23 Yes [provider]  EPINEPHrine 0.3 mg/0.3 mL IJ SOAJ injection Inject 0.3 mg into the muscle as needed for anaphylaxis. 05/26/23  Yes [provider]  furosemide (LASIX) 20 MG tablet Take 1 tablet (20 mg total) by mouth ONCE or TWICE daily (total daily dose maximum 40 mg) as needed for up to 3 days for increased leg swelling, shortness of breath while lying down flat on back, weight gain 5+ lbs over 1-2 days. Seek medical care if these symptoms are not improving with increased dose. 07/03/23  Yes Sunnie Nielsen, DO  ipratropium (ATROVENT) 0.06 % nasal spray Place into the nose. 07/12/23 07/11/24 Yes [provider]   ipratropium-albuterol (DUONEB) 0.5-2.5 (3) MG/3ML SOLN Take 3 mLs by nebulization 2 (two) times daily. Mix with budesonide   Yes [provider]  losartan (COZAAR) 100 MG tablet Take by mouth.   Yes [provider]  metoprolol succinate (TOPROL-XL) 50 MG 24 hr tablet Take 0.5 tablets (25 mg total) by mouth in the morning and at bedtime. Take with or immediately following a meal. 08/18/23 12/16/23 Yes Wittenborn, Gavin Pound, NP  Multiple Vitamins-Minerals (PRESERVISION AREDS 2+MULTI VIT PO) Take 2 tablets by mouth daily.   Yes [provider]  OHTUVAYRE 3 MG/2.5ML SUSP Take 3 mg by nebulization daily. 05/31/23  Yes [provider]  potassium chloride (KLOR-CON) 20 MEQ packet Take 20 mEq by mouth daily on days Lasix is taken 07/03/23  Yes Sunnie Nielsen, DO  potassium chloride SA (KLOR-CON  M) 20 MEQ tablet Take 20 mEq by mouth daily. 07/03/23  Yes [provider]  pravastatin (PRAVACHOL) 10 MG tablet Take 10 mg by mouth daily.   Yes [provider]  sertraline (ZOLOFT) 100 MG tablet Take 1 tablet (100 mg total) by mouth daily. 07/03/23 07/02/24 Yes Sunnie Nielsen, DO  sulfamethoxazole-trimethoprim (BACTRIM) 400-80 MG tablet Take 1 tablet by mouth 3 (three) times a week. Monday, Wednesday and Friday 03/06/23  Yes [provider]  Dupilumab 300 MG/2ML SOAJ Inject 300 mg into the skin every 14 (fourteen) days. Saturday 05/26/23   [provider]  Multiple Vitamin (MULTI-VITAMIN) tablet Take 1 tablet by mouth daily.    [provider]  omeprazole (PRILOSEC) 40 MG capsule Take 40 mg by mouth daily as needed (Heartburn). 03/31/22 06/24/23  [provider]    Inpatient Medications: Scheduled Meds:  apixaban  5 mg Oral BID   aspirin EC  81 mg Oral Daily   cholecalciferol  5,000 Units Oral Q1500   feeding supplement  237 mL Oral BID BM   insulin aspart  0-9 Units Subcutaneous TID WC   ipratropium-albuterol  3 mL  Nebulization TID   losartan  100 mg Oral Daily   metoprolol succinate  25 mg Oral BID   multivitamin with minerals  1 tablet Oral Daily   pravastatin  10 mg Oral q1800   sertraline  100 mg Oral Daily   Continuous Infusions:  amiodarone 30 mg/hr (08/23/23 0619)   cefTRIAXone (ROCEPHIN)  IV 2 g (08/23/23 1110)   doxycycline (VIBRAMYCIN) IV 100 mg (08/23/23 0842)   PRN Meds: acetaminophen **OR** acetaminophen, hydrALAZINE, ondansetron **OR** ondansetron (ZOFRAN) IV, pantoprazole, senna-docusate  Allergies:    Allergies  Allergen Reactions   Bupropion Other (See Comments)    Foggy brain    Social History:   Social History   Socioeconomic History   Marital status: Married    Spouse name: Dorene Sorrow   Number of children: Not on file   Years of education: Not on file   Highest education level: Not on file  Occupational History   Not on file  Tobacco Use   Smoking status: Former    Current packs/day: 0.00    Average packs/day: 0.3 packs/day for 50.0 years (12.5 ttl pk-yrs)    Types: Cigarettes    Start date: 01/10/1972    Quit date: 01/09/2022    Years since quitting: 1.6   Smokeless tobacco: Never  Vaping Use   Vaping status: Never Used  Substance and Sexual Activity   Alcohol use: Never   Drug use: Never   Sexual activity: Not Currently  Other Topics Concern   Not on file  Social History Narrative   Not on file   Social Drivers of Health   Financial Resource Strain: Low Risk  (07/06/2023)   Received from [facility]   Overall Financial Resource Strain (CARDIA)    Difficulty of Paying Living Expenses: Not hard at all  Food Insecurity: No Food Insecurity (08/23/2023)   Hunger Vital Sign    Worried About Running Out of Food in the Last Year: Never true    Ran Out of Food in the Last Year: Never true  Transportation Needs: No Transportation Needs (08/23/2023)   PRAPARE - Administrator, Civil Service (Medical): No    Lack of Transportation  (Non-Medical): No  Physical Activity: Not on file  Stress: Not on file  Social Connections: Moderately Isolated (08/23/2023)   Social Connection  and Isolation Panel [NHANES]    Frequency of Communication with Friends and Family: Three times a week    Frequency of Social Gatherings with Friends and Family: More than three times a week    Attends Religious Services: Never    Database administrator or Organizations: No    Attends Banker Meetings: Never    Marital Status: Married  Catering manager Violence: Not At Risk (08/23/2023)   Humiliation, Afraid, Rape, and Kick questionnaire    Fear of Current or Ex-Partner: No    Emotionally Abused: No    Physically Abused: No    Sexually Abused: No    Family History:    Family History  Problem Relation Age of Onset   Breast cancer Mother 64   Heart Problems Maternal Uncle    Heart Problems Paternal Aunt    Heart Problems Paternal Uncle      ROS:  Please see the history of present illness.   All other ROS reviewed and negative.     Physical Exam/Data:   Vitals:   08/23/23 0800 08/23/23 0820 08/23/23 1245 08/23/23 1302  BP:  (!) 124/96  (!) 186/144  Pulse:    (!) 152  Resp:    (!) 28  Temp: 97.6 F (36.4 C) 97.6 F (36.4 C) (!) 97.4 F (36.3 C) 98 F (36.7 C)  TempSrc: Oral Oral  Oral  SpO2:    92%  Weight:      Height:        Intake/Output Summary (Last 24 hours) at 08/23/2023 1412 Last data filed at 08/23/2023 0300 Gross per 24 hour  Intake 1165.26 ml  Output --  Net 1165.26 ml      08/21/2023    9:00 AM 08/18/2023    2:28 PM 06/29/2023    7:42 AM  Last 3 Weights  Weight (lbs) 100 lb 15.5 oz 101 lb 110 lb 3.7 oz  Weight (kg) 45.8 kg 45.813 kg 50 kg     Body mass index is 18.47 kg/m.  General:  Well nourished, well developed, in no acute distress HEENT: normal Neck: no JVD Vascular: No carotid bruits; Distal pulses 2+ bilaterally Cardiac:  normal S1, S2; RRR; no murmur  Lungs:  crackles and  rhonchi Abd: soft, nontender, no hepatomegaly  Ext: no edema Musculoskeletal:  No deformities, BUE and BLE strength normal and equal Skin: warm and dry  Neuro:  CNs 2-12 intact, no focal abnormalities noted Psych:  Normal affect   EKG:  The EKG was personally reviewed and demonstrates:  Afib 139bpm, nonspecific T wave changes Telemetry:  Telemetry was personally reviewed and demonstrates:  Afib 150<NSR 70s  Relevant CV Studies:  Echo 06/2023 1. Left ventricular ejection fraction, by estimation, is 55 to 60%. The  left ventricle has normal function. The left ventricle has no regional  wall motion abnormalities. Left ventricular diastolic parameters are  consistent with Grade II diastolic  dysfunction (pseudonormalization).   2. Right ventricular systolic function is normal. The right ventricular  size is normal. There is moderately elevated pulmonary artery systolic  pressure.   3. The mitral valve is degenerative. Moderate to severe mitral valve  regurgitation. No evidence of mitral stenosis.   4. Tricuspid valve regurgitation is mild to moderate.   5. The aortic valve is tricuspid. There is mild thickening of the aortic  valve. Aortic valve regurgitation is trivial. Aortic valve  sclerosis/calcification is present, without any evidence of aortic  stenosis.   6. The inferior  vena cava is normal in size with <50% respiratory  variability, suggesting right atrial pressure of 8 mmHg.    Echo 01/2023 1. Left ventricular ejection fraction, by estimation, is 55 to 60%. The  left ventricle has normal function. The left ventricle has no regional  wall motion abnormalities. Left ventricular diastolic parameters are  indeterminate. The average left  ventricular global longitudinal strain is -19.0 %.   2. Right ventricular systolic function is normal. The right ventricular  size is normal. There is normal pulmonary artery systolic pressure. The  estimated right ventricular systolic  pressure is 28.8 mmHg.   3. The mitral valve is normal in structure. Moderate to severe mitral  valve regurgitation. No evidence of mitral stenosis. There is mild late  systolic prolapse of multiple segments of the anterior leaflet of the  mitral valve.   4. Tricuspid valve regurgitation is mild to moderate.   5. The aortic valve is normal in structure. Aortic valve regurgitation is  not visualized. No aortic stenosis is present.   6. The inferior vena cava is normal in size with greater than 50%  respiratory variability, suggesting right atrial pressure of 3 mmHg.   Cardiac CTA 2023 IMPRESSION: 1. Coronary calcium score of 95.3. This was 59th percentile for age and sex matched control.   2. Normal coronary origin with right dominance.   3. Mild proximal LCx stenosis (25%).   4. Minimal proximal RCA stenosis (<25%).   5. CAD-RADS 2. Mild non-obstructive CAD (25-49%). Consider non-atherosclerotic causes of chest pain. Consider preventive therapy and risk factor modification.   6. Image quality degraded by motion artifacts.      Laboratory Data:  High Sensitivity Troponin:   Recent Labs  Lab 08/21/23 0908 08/21/23 1136  TROPONINIHS 24* 51*     Chemistry Recent Labs  Lab 08/21/23 0908 08/22/23 0717 08/23/23 0245  NA 141 142 138  K 5.2* 3.6 3.8  CL 108 112* 110  CO2 20* 24 20*  GLUCOSE 327* 87 116*  BUN 32* 21 17  CREATININE 0.83 0.64 0.55  CALCIUM 8.7* 7.8* 8.0*  MG  --   --  2.1  GFRNONAA >60 >60 >60  ANIONGAP 13 6 8     Recent Labs  Lab 08/21/23 0908  PROT 6.3*  ALBUMIN 3.0*  AST 38  ALT 49*  ALKPHOS 75  BILITOT 0.7   Lipids No results for input(s): "CHOL", "TRIG", "HDL", "LABVLDL", "LDLCALC", "CHOLHDL" in the last 168 hours.  Hematology Recent Labs  Lab 08/21/23 0908 08/22/23 0717 08/23/23 0245  WBC 29.4* 24.9* 26.7*  RBC 3.89 3.17* 3.46*  HGB 13.3 10.7* 11.7*  HCT 41.8 33.9* 35.6*  MCV 107.5* 106.9* 102.9*  MCH 34.2* 33.8 33.8  MCHC 31.8  31.6 32.9  RDW 13.8 13.9 14.1  PLT 630* 421* 467*   Thyroid No results for input(s): "TSH", "FREET4" in the last 168 hours.  BNP Recent Labs  Lab 08/21/23 0908 08/23/23 0245  BNP 887.9* 1,377.0*    DDimer No results for input(s): "DDIMER" in the last 168 hours.   Radiology/Studies:  DG Chest 1 View Result Date: 08/23/2023 CLINICAL DATA:  78 year old female with shortness of breath. Emphysema, pneumonia. EXAM: CHEST  1 VIEW COMPARISON:  Portable chest 08/21/2023 and earlier. FINDINGS: Portable AP upright view at 0409 hours. Stable large lung volumes. Normal cardiac size and mediastinal contours. Visualized tracheal air column is within normal limits. Coarse and nodular left greater than right upper lobe predominant airspace opacity has not significantly changed. No  superimposed pneumothorax. No overt consolidation at this time. Small pleural effusions demonstrated by CT in January may persist. Stable visualized osseous structures.  Paucity of bowel gas. IMPRESSION: Emphysema ([id number]) with left greater than right lung Pneumonia not significantly changed from 08/21/2023. Small pleural effusions suspected. Electronically Signed   By: Odessa Fleming M.D.   On: 08/23/2023 04:47   DG Chest Portable 1 View Result Date: 08/21/2023 CLINICAL DATA:  Shortness of breath. Respiratory distress. History of COPD. EXAM: PORTABLE CHEST 1 VIEW COMPARISON:  07/02/2023. FINDINGS: Bilateral lungs appear hyperexpanded and hyperlucent with coarse bronchovascular markings, in keeping with COPD. There are superimposed diffuse alveolar and interstitial opacities overlying the left upper mid lung zone, upper portion of left lower lung zone and right upper lung zone, compatible with multilobar pneumonia. Follow-up to clearing is recommended. Bilateral lateral costophrenic angles are clear. No large pleural effusion or pneumothorax. Normal cardio-mediastinal silhouette. No acute osseous abnormalities. The soft tissues are within  normal limits. IMPRESSION: *Findings favor multilobar pneumonia, as described above. Follow-up to clearing is recommended. *Underlying COPD. Electronically Signed   By: Jules Schick M.D.   On: 08/21/2023 09:31     Assessment and Plan:   Rapid Afib - h/o Afib on amiodarone PTA - admitted for severe sepsis, multifocal PNA, COPD - initially she was in ST/NSR but later converted to rapid Afib upt to 150. she was on IV amiodarone with no improvement of HR - she was given amiodarone bolus and started on IV dilt with conversion to NSR 3/19 around 2 pm - continue Eliquis 5mg  BID - Toprol 25mg  BID for rate control - continue IV amiodarone for now  Severe Sepsis COPD exacerbation PNA - patient presented with SOB requiring Bipap - LA 3.2>3 - CXR showed PNA and small pleural effusions - s/p IVF - IV lasix - steroids - BC pending - still on supplemental O2 - abx per IM  Elevated troponin Nonobstructive CAD by cardiac CT 2023 - HS trop 24>51 - suspect supply demand mismatch in the setting of Sepsis, PNA, COPD exacerbation and Afib - no further ischemic work-up  HFimpEF - Echo 06/2023 showed LVEF 55-60%, no WMA, G2DD, mod to severe MR, mild to mod TR - BNP 1377 - IV lasix 40mg  daily - monitor strict I/Os, weight and kidney function  Moderate MR - TEE 04/2023 showed moderate MR - continue to monitor OP  For questions or updates, please contact [facility] Please consult [email or web address] for contact info under    Signed, Siennah Barrasso David Stall, PA-C  08/23/2023 2:12 PM

## 2023-08-23 NOTE — Progress Notes (Signed)
 Initial Nutrition Assessment  DOCUMENTATION CODES:   Underweight  INTERVENTION:   -Liberalize diet to regular for widest variety of meal selections -MVI with minerals daily -Ensure Enlive po BID, each supplement provides 350 kcal and 20 grams of protein  NUTRITION DIAGNOSIS:   Increased nutrient needs related to chronic illness (COPD) as evidenced by estimated needs.  GOAL:   Patient will meet greater than or equal to 90% of their needs  MONITOR:   PO intake, Supplement acceptance  REASON FOR ASSESSMENT:   Consult Assessment of nutrition requirement/status  ASSESSMENT:   Pt with history of COPD, hyperlipidemia, heart failure preserved ejection fraction, hypertension, depression, who presents for chief concerns of respiratory distress  Pt admitted with severe sepsis with acute organ dysfunction and COPD exacerbation.   Reviewed I/O's: +1.2 L x 24 hours  UOP: 300 ml x 24 hours  Attempted to see pt x 2, however, in with palliative care team. Noted pt is currently DNR with limited interventions. She is having increased work of breathing  on Bi-pap.   Reviewed wt hx; pt has experienced a 8.6% wt loss over the past 3 months, which is significant for time frame.   Medications reviewed and include vitamin D3.   Lab Results  Component Value Date   HGBA1C 5.6 06/25/2023   PTA DM medications are none.   Labs reviewed: CBGS: 89-114 (inpatient orders for glycemic control are 0-9 units insulin aspart TID with meals).    Diet Order:   Diet Order             Diet regular Room service appropriate? Yes; Fluid consistency: Thin  Diet effective now                   EDUCATION NEEDS:   Not appropriate for education at this time  Skin:  Skin Assessment: Reviewed RN Assessment  Last BM:  Unknown  Height:   Ht Readings from Last 1 Encounters:  08/21/23 5\' 2"  (1.575 m)    Weight:   Wt Readings from Last 1 Encounters:  08/21/23 45.8 kg    Ideal Body Weight:   50 kg  BMI:  Body mass index is 18.47 kg/m.  Estimated Nutritional Needs:   Kcal:  1400-1600  Protein:  65-80 grams  Fluid:  1.4-1.6 L    Levada Schilling, RD, LDN, CDCES Registered Dietitian III Certified Diabetes Care and Education Specialist If unable to reach this RD, please use "RD Inpatient" group chat on secure chat between hours of 8am-4 pm daily

## 2023-08-23 NOTE — Plan of Care (Signed)
  Problem: Fluid Volume: Goal: Hemodynamic stability will improve Outcome: Progressing   Problem: Fluid Volume: Goal: Hemodynamic stability will improve Outcome: Progressing

## 2023-08-24 ENCOUNTER — Inpatient Hospital Stay

## 2023-08-24 DIAGNOSIS — A419 Sepsis, unspecified organism: Secondary | ICD-10-CM | POA: Diagnosis not present

## 2023-08-24 DIAGNOSIS — I4891 Unspecified atrial fibrillation: Secondary | ICD-10-CM | POA: Diagnosis not present

## 2023-08-24 DIAGNOSIS — J441 Chronic obstructive pulmonary disease with (acute) exacerbation: Secondary | ICD-10-CM | POA: Diagnosis not present

## 2023-08-24 DIAGNOSIS — Z515 Encounter for palliative care: Secondary | ICD-10-CM

## 2023-08-24 DIAGNOSIS — I5033 Acute on chronic diastolic (congestive) heart failure: Secondary | ICD-10-CM

## 2023-08-24 DIAGNOSIS — R7989 Other specified abnormal findings of blood chemistry: Secondary | ICD-10-CM | POA: Diagnosis not present

## 2023-08-24 DIAGNOSIS — J9621 Acute and chronic respiratory failure with hypoxia: Secondary | ICD-10-CM | POA: Diagnosis not present

## 2023-08-24 LAB — GLUCOSE, CAPILLARY
Glucose-Capillary: 90 mg/dL (ref 70–99)
Glucose-Capillary: 172 mg/dL — ABNORMAL HIGH (ref 70–99)
Glucose-Capillary: 297 mg/dL — ABNORMAL HIGH (ref 70–99)
Glucose-Capillary: 202 mg/dL — ABNORMAL HIGH (ref 70–99)

## 2023-08-24 LAB — CBC
HCT: 36.3 % (ref 36.0–46.0)
Hemoglobin: 12 g/dL (ref 12.0–15.0)
MCH: 33.6 pg (ref 26.0–34.0)
MCHC: 33.1 g/dL (ref 30.0–36.0)
MCV: 101.7 fL — ABNORMAL HIGH (ref 80.0–100.0)
Platelets: 391 10*3/uL (ref 150–400)
RBC: 3.57 MIL/uL — ABNORMAL LOW (ref 3.87–5.11)
RDW: 13.8 % (ref 11.5–15.5)
WBC: 25.8 10*3/uL — ABNORMAL HIGH (ref 4.0–10.5)
nRBC: 0.1 % (ref 0.0–0.2)

## 2023-08-24 LAB — BASIC METABOLIC PANEL
Anion gap: 11 (ref 5–15)
BUN: 14 mg/dL (ref 8–23)
CO2: 26 mmol/L (ref 22–32)
Calcium: 7.9 mg/dL — ABNORMAL LOW (ref 8.9–10.3)
Chloride: 105 mmol/L (ref 98–111)
Creatinine, Ser: 0.39 mg/dL — ABNORMAL LOW (ref 0.44–1.00)
GFR, Estimated: >60 mL/min (ref >60)
Glucose, Bld: 92 mg/dL (ref 70–99)
Potassium: 3 mmol/L — ABNORMAL LOW (ref 3.5–5.1)
Sodium: 142 mmol/L (ref 135–145)

## 2023-08-24 LAB — PHOSPHORUS: Phosphorus: 1.7 mg/dL — ABNORMAL LOW (ref 2.5–4.6)

## 2023-08-24 LAB — STREP PNEUMONIAE URINARY ANTIGEN: Strep Pneumo Urinary Antigen: NEGATIVE

## 2023-08-24 LAB — PROCALCITONIN: Procalcitonin: 1.06 ng/mL

## 2023-08-24 MED ORDER — FUROSEMIDE 10 MG/ML IJ SOLN
40.0000 mg | Freq: Two times a day (BID) | INTRAMUSCULAR | Status: DC
  Administered 2023-08-24 – 2023-08-27 (×6): 40 mg via INTRAVENOUS
  Filled 2023-08-24 (×6): qty 4

## 2023-08-24 MED ORDER — BOOST PLUS PO LIQD
237.0000 mL | Freq: Three times a day (TID) | ORAL | Status: DC
  Administered 2023-08-27 – 2023-08-29 (×8): 237 mL via ORAL
  Filled 2023-08-24: qty 237

## 2023-08-24 MED ORDER — POTASSIUM CHLORIDE CRYS ER 20 MEQ PO TBCR
40.0000 meq | EXTENDED_RELEASE_TABLET | Freq: Once | ORAL | Status: AC
  Administered 2023-08-24: 40 meq via ORAL
  Filled 2023-08-24: qty 2

## 2023-08-24 MED ORDER — POTASSIUM CHLORIDE CRYS ER 20 MEQ PO TBCR
40.0000 meq | EXTENDED_RELEASE_TABLET | Freq: Two times a day (BID) | ORAL | Status: DC
  Administered 2023-08-24 – 2023-08-27 (×8): 40 meq via ORAL
  Filled 2023-08-24 (×8): qty 2

## 2023-08-24 MED ORDER — POTASSIUM CHLORIDE CRYS ER 20 MEQ PO TBCR
20.0000 meq | EXTENDED_RELEASE_TABLET | Freq: Two times a day (BID) | ORAL | Status: DC
Start: 2023-08-24 — End: 2023-08-24

## 2023-08-24 MED ORDER — POTASSIUM CHLORIDE CRYS ER 20 MEQ PO TBCR: 40.0000 meq | EXTENDED_RELEASE_TABLET | Freq: Two times a day (BID) | ORAL | Status: DC

## 2023-08-24 MED ORDER — POTASSIUM CHLORIDE CRYS ER 20 MEQ PO TBCR: 40.0000 meq | EXTENDED_RELEASE_TABLET | Freq: Once | ORAL | Status: DC

## 2023-08-24 NOTE — Progress Notes (Signed)
 Nutrition Follow-up  DOCUMENTATION CODES:   Underweight, Severe malnutrition in context of chronic illness  INTERVENTION:   -D/c Ensure Enlive po BID, each supplement provides 350 kcal and 20 grams of protein -Boost Plus BID, each supplement provides 360 kcals and 16 grams protein -Continue MVI with minerals daily -Continue regular diet   NUTRITION DIAGNOSIS:   Severe Malnutrition related to chronic illness (COPD) as evidenced by moderate fat depletion, severe fat depletion, moderate muscle depletion, severe muscle depletion, percent weight loss.  Ongoing  GOAL:   Patient will meet greater than or equal to 90% of their needs  Progressing   MONITOR:   PO intake, Supplement acceptance  REASON FOR ASSESSMENT:   Consult Assessment of nutrition requirement/status  ASSESSMENT:   Pt with history of COPD, hyperlipidemia, heart failure preserved ejection fraction, hypertension, depression, who presents for chief concerns of respiratory distress  Reviewed I/O's: +100 ml x 24 hours and +1.3 L x 24 hours  UOP: 400 ml x 24 hours  Spoke with pt and husband at bedside. Pt feels good today and feels positive over overall improvement. She notes she has not required BI-pap today and respiratoiry status has improved; pt is grateful for the care receives here.   Pt reports her appetite is"picking Up". She still isn't eating much, but appreciative of regular diet. Pt reports craving biscuits and gravy. Discussed rationale for liberalized diet to due to decreased oral intake and weight loss. Encouraged family to being in outside foods if desired. Pt drinking Ensure, but prefers chocolate Boost (drinks 2 per day at home).   Pt report losing "a lot of weight" over the past several months due to poor oral intake. Reviewed wt hx; pt has experienced a 8.6% wt loss over the past 3 months, which is significant for time frame.   Palliative care following for goals of care discussions.    Medications reviewed and include vitamin D3, lasix, and prednsione.   Labs reviewed: CBGS: 90-172 (inpatient orders for glycemic control are none).    NUTRITION - FOCUSED PHYSICAL EXAM:  Flowsheet Row Most Recent Value  Orbital Region Moderate depletion  Upper Arm Region Severe depletion  Thoracic and Lumbar Region Moderate depletion  Buccal Region Severe depletion  Temple Region Moderate depletion  Clavicle Bone Region Severe depletion  Clavicle and Acromion Bone Region Severe depletion  Scapular Bone Region Severe depletion  Dorsal Hand Severe depletion  Patellar Region Severe depletion  Anterior Thigh Region Severe depletion  Posterior Calf Region Severe depletion  Edema (RD Assessment) None  Hair Reviewed  Eyes Reviewed  Mouth Reviewed  Skin Reviewed  Nails Reviewed       Diet Order:   Diet Order             Diet regular Room service appropriate? Yes; Fluid consistency: Thin  Diet effective now                   EDUCATION NEEDS:   Education needs have been addressed  Skin:  Skin Assessment: Reviewed RN Assessment  Last BM:  Unknown  Height:   Ht Readings from Last 1 Encounters:  08/21/23 5\' 2"  (1.575 m)    Weight:   Wt Readings from Last 1 Encounters:  08/21/23 45.8 kg    Ideal Body Weight:  50 kg  BMI:  Body mass index is 18.47 kg/m.  Estimated Nutritional Needs:   Kcal:  1400-1600  Protein:  65-80 grams  Fluid:  1.4-1.6 L    Naren Benally W,  RD, LDN, CDCES Registered Dietitian III Certified Diabetes Care and Education Specialist If unable to reach this RD, please use "RD Inpatient" group chat on secure chat between hours of 8am-4 pm daily

## 2023-08-24 NOTE — Progress Notes (Signed)
 Cardiology Progress Note   Patient Name: Taylor Chambers Date of Encounter: 08/24/2023  Primary Cardiologist: Lorine Bears, MD  Subjective   Breathing improved.  Was taken off of bipap earlier this AM.  No recurrent Afib. Objective   Inpatient Medications    Scheduled Meds:  apixaban  5 mg Oral BID   aspirin EC  81 mg Oral Daily   cholecalciferol  5,000 Units Oral Q1500   feeding supplement  237 mL Oral BID BM   furosemide  40 mg Intravenous Daily   insulin aspart  0-9 Units Subcutaneous TID WC   levalbuterol  0.63 mg Nebulization TID   losartan  100 mg Oral Daily   metoprolol succinate  25 mg Oral BID   multivitamin with minerals  1 tablet Oral Daily   potassium chloride SA  20 mEq Oral BID   pravastatin  10 mg Oral q1800   predniSONE  50 mg Oral Q breakfast   sertraline  100 mg Oral Daily   Continuous Infusions:  amiodarone 30 mg/hr (08/24/23 1100)   cefTRIAXone (ROCEPHIN)  IV 2 g (08/24/23 1121)   doxycycline (VIBRAMYCIN) IV Stopped (08/24/23 1101)   PRN Meds: acetaminophen **OR** acetaminophen, haloperidol lactate, hydrALAZINE, LORazepam, ondansetron **OR** ondansetron (ZOFRAN) IV, pantoprazole, senna-docusate   Vital Signs    Vitals:   08/24/23 0400 08/24/23 0410 08/24/23 0800 08/24/23 1012  BP: (!) 146/74  (!) 160/77 (!) 141/72  Pulse: 65  69 78  Resp: 15 (!) 27 20 (!) 22  Temp: 98 F (36.7 C)  98.5 F (36.9 C)   TempSrc: Axillary  Axillary   SpO2: 99%  99% 98%  Weight:      Height:        Intake/Output Summary (Last 24 hours) at 08/24/2023 1218 Last data filed at 08/24/2023 1115 Gross per 24 hour  Intake 1638.15 ml  Output 2200 ml  Net -561.85 ml   Filed Weights   08/21/23 0900  Weight: 45.8 kg    Physical Exam   GEN: Well nourished, well developed, in no acute distress.  HEENT: Grossly normal.  Neck: Supple, no JVD, carotid bruits, or masses. Cardiac: RRR, no murmurs, rubs, or gallops. No clubbing, cyanosis, edema.  Radials 2+, DP/PT  2+ and equal bilaterally.  Respiratory:  Respirations regular and unlabored, scattered rhonchi. GI: Soft, nontender, nondistended, BS + x 4. MS: no deformity or atrophy. Skin: warm and dry, no rash. Neuro:  Strength and sensation are intact. Psych: AAOx3.  Normal affect.  Labs    Chemistry Recent Labs  Lab 08/21/23 0908 08/22/23 0717 08/23/23 0245 08/24/23 0456  NA 141 142 138 142  K 5.2* 3.6 3.8 3.0*  CL 108 112* 110 105  CO2 20* 24 20* 26  GLUCOSE 327* 87 116* 92  BUN 32* 21 17 14   CREATININE 0.83 0.64 0.55 0.39*  CALCIUM 8.7* 7.8* 8.0* 7.9*  PROT 6.3*  --   --   --   ALBUMIN 3.0*  --   --   --   AST 38  --   --   --   ALT 49*  --   --   --   ALKPHOS 75  --   --   --   BILITOT 0.7  --   --   --   GFRNONAA >60 >60 >60 >60  ANIONGAP 13 6 8 11      Hematology Recent Labs  Lab 08/22/23 0717 08/23/23 0245 08/24/23 0456  WBC 24.9* 26.7* 25.8*  RBC 3.17* 3.46* 3.57*  HGB 10.7* 11.7* 12.0  HCT 33.9* 35.6* 36.3  MCV 106.9* 102.9* 101.7*  MCH 33.8 33.8 33.6  MCHC 31.6 32.9 33.1  RDW 13.9 14.1 13.8  PLT 421* 467* 391    Cardiac Enzymes  Recent Labs  Lab 08/21/23 0908 08/21/23 1136  TROPONINIHS 24* 51*      BNP    Component Value Date/Time   BNP 1,377.0 (H) 08/23/2023 0245    Lipids  Lab Results  Component Value Date   CHOL 159 05/16/2023   HDL 64 05/16/2023   LDLCALC 74 05/16/2023   LDLDIRECT 71 05/16/2023   TRIG 121 05/16/2023   CHOLHDL 2.5 05/16/2023    HbA1c  Lab Results  Component Value Date   HGBA1C 5.6 06/25/2023    Radiology    DG Chest Port 1 View Result Date: 08/24/2023 CLINICAL DATA:  CHF.  Pneumonia.  COPD. EXAM: PORTABLE CHEST 1 VIEW COMPARISON:  Chest radiograph dated 08/23/2023. FINDINGS: No interval change in bilateral, left greater than right, pulmonary opacities. Small bilateral pleural effusions again noted. No pneumothorax. Stable cardiac silhouette. No acute osseous pathology. IMPRESSION: 1. No interval change in  bilateral, left greater than right, pulmonary opacities. 2. Small bilateral pleural effusions. Electronically Signed   By: Elgie Collard M.D.   On: 08/24/2023 09:33   DG Chest 1 View Result Date: 08/23/2023 CLINICAL DATA:  78 year old female with shortness of breath. Emphysema, pneumonia. EXAM: CHEST  1 VIEW COMPARISON:  Portable chest 08/21/2023 and earlier. FINDINGS: Portable AP upright view at 0409 hours. Stable large lung volumes. Normal cardiac size and mediastinal contours. Visualized tracheal air column is within normal limits. Coarse and nodular left greater than right upper lobe predominant airspace opacity has not significantly changed. No superimposed pneumothorax. No overt consolidation at this time. Small pleural effusions demonstrated by CT in January may persist. Stable visualized osseous structures.  Paucity of bowel gas. IMPRESSION: Emphysema ([id number]) with left greater than right lung Pneumonia not significantly changed from 08/21/2023. Small pleural effusions suspected. Electronically Signed   By: Odessa Fleming M.D.   On: 08/23/2023 04:47   DG Chest Portable 1 View Result Date: 08/21/2023 CLINICAL DATA:  Shortness of breath. Respiratory distress. History of COPD. EXAM: PORTABLE CHEST 1 VIEW COMPARISON:  07/02/2023. FINDINGS: Bilateral lungs appear hyperexpanded and hyperlucent with coarse bronchovascular markings, in keeping with COPD. There are superimposed diffuse alveolar and interstitial opacities overlying the left upper mid lung zone, upper portion of left lower lung zone and right upper lung zone, compatible with multilobar pneumonia. Follow-up to clearing is recommended. Bilateral lateral costophrenic angles are clear. No large pleural effusion or pneumothorax. Normal cardio-mediastinal silhouette. No acute osseous abnormalities. The soft tissues are within normal limits. IMPRESSION: *Findings favor multilobar pneumonia, as described above. Follow-up to clearing is recommended.  *Underlying COPD. Electronically Signed   By: Jules Schick M.D.   On: 08/21/2023 09:31     Telemetry    RSR, brief run of SVT.  No recurrence of Afib - Personally Reviewed  Cardiac Studies   2D Echocardiogram 1.2025  1. Left ventricular ejection fraction, by estimation, is 55 to 60%. The  left ventricle has normal function. The left ventricle has no regional  wall motion abnormalities. Left ventricular diastolic parameters are  consistent with Grade II diastolic  dysfunction (pseudonormalization).   2. Right ventricular systolic function is normal. The right ventricular  size is normal. There is moderately elevated pulmonary artery systolic  pressure.   3. The mitral  valve is degenerative. Moderate to severe mitral valve  regurgitation. No evidence of mitral stenosis.   4. Tricuspid valve regurgitation is mild to moderate.   5. The aortic valve is tricuspid. There is mild thickening of the aortic  valve. Aortic valve regurgitation is trivial. Aortic valve  sclerosis/calcification is present, without any evidence of aortic  stenosis.   6. The inferior vena cava is normal in size with <50% respiratory  variability, suggesting right atrial pressure of 8 mmHg.  _____________   Patient Profile     78 y.o. female with a hx of nonobstructive CAD, HFimpEF, mitral valve regurgitation, Paroxysmal Aflutter, HTN, HLD, COPD, and tobacco use, who was admitted 3/17 with acute on chronic resp failure and HFpEF.  She developed rapid Afib on 3/19, converted w/ IV amio.  Assessment & Plan    1.  Acute on chronic resp failure/AECOPD/HFimpEF:  Nl LV fxn by echo in 06/2023.  Admitted 3/19 w/ resp failure and volume overload.  Responding well to IV diuresis, nebs, and steroids.  I/O inaccurate however, pt notes good response.  1800 ml out today so far.  Renal fxn stable.  Cont IV diuresis.  2.  PAF/Flutter:  recurrent Afib on 3/19 w/ subsequent conversion.  Was on PO amio in outpt setting.  Currently  on IV amio.  No recurrent afib overnight.  Will cont IV amio today w/ plan to transition back to PO tomorrow, as she remains at relatively high risk for recurrent afib in the setting of resp failure.  Cont OAC w/ eliquis.  3.  Demand Ischemia:  hsTrop up to 51 in setting of above.  Denies c/p.  No plan for ischemic eval at this time.  Plan to d/c aspirin on discharge in light of chronic eliquis.  Cont ? blocker and statin.  4.  Hypokalemia:  supplementation ordered.  Signed, Nicolasa Ducking, NP  08/24/2023, 12:18 PM    For questions or updates, please contact   Please consult [email or web address] for contact info under Cardiology/STEMI.

## 2023-08-24 NOTE — Progress Notes (Signed)
 Palliative Care Progress Note, Assessment & Plan   Patient Name: Taylor Chambers       Date: 08/24/2023 DOB: 1945-08-05  Age: 78 y.o. MRN#: [id number] Attending Physician: Sunnie Nielsen, DO Primary Care Physician: Mick Sell, MD Admit Date: 08/21/2023  Subjective: Patient is sitting up in bed with nasal cannula in place.  She is awake, alert, and oriented x 4.  She acknowledges my presence and is able to make her wishes known.  Her husband is at bedside during my visit.  HPI: 78 y.o. female  with past medical history of COPD with chronic respiratory failure (2-3 L [place] at baseline), OSA (CPAP), CAD, former tobacco use, A-fib, HFpEF, HTN, and HLD admitted on 08/21/2023 with shortness of breath.     Of note, patient was admitted 1/18 - 06/28/2023 for AECOPD, A-fib with RVR, and CHF exacerbation and returned the next day 1/23 was discharged on 1/27 for acute on chronic respiratory failure with hypoxia and hypercapnia.   Patient is being treated for severe sepsis with acute organ dysfunction and AECOPD.   PMT was consulted to support patient and family with goals of care discussions.  Of note, patient is familiar to our service as my colleague met with her during her 1/23 - 07/03/2023 admission.  Summary of counseling/coordination of care: Extensive chart review completed prior to meeting patient including labs, vital signs, imaging, progress notes, orders, and available advanced directive documents from current and previous encounters.   After reviewing the patient's chart and assessing the patient at bedside, I spoke with patient in regards to symptom management and goals of care.   Symptoms assessed.  Patient shares this is the best day that she has had all year.  She shares the night went well  without issue.  She says she feels like she can breathe better today.    Discussed cardiology's recommendations (continue lasix, metroprolol, amio gtt).  She shares she has just spoken with cardiology and has a clear understanding of her treatment plan at this time. Discussed that patient has converted back to normal sinus rhythm.  She endorses that this has helped her feel much better.  She continues to have some shortness of breath but is able to carry on a conversation without becoming dyspneic. She has no acute complaints at this time.  No adjustment to [facility] needed.  We discussed values and goals important to the patient. She is cautiously optimistic, stating that she wants to take it "one thing at a time". Patient remains in agreement to continue to treat the treatable with DNR-limited interventions in place.    Goals are clear.  Patient is having a "good day" and is showing signs of improvement.  However, she remains at risk for deterioration.  PMT will continue to follow and support.    I shared with family that I will follow-up with them at my return to service next week.  However, they were made aware to reach out to PMT with any acute palliative needs until my return.  A PMT colleague will shadow the patient's chart and monitor her peripherally until I return next week.  Please reach out to PMT should acute palliative issues arise.  Physical  Exam Constitutional:      General: She is not in acute distress.    Appearance: She is normal weight.  HENT:     Head: Normocephalic.     Nose: Nose normal.     Mouth/Throat:     Mouth: Mucous membranes are moist.  Eyes:     Pupils: Pupils are equal, round, and reactive to light.  Cardiovascular:     Rate and Rhythm: Normal rate. Rhythm irregular.  Pulmonary:     Effort: No respiratory distress.     Comments: [place] in place Skin:    General: Skin is warm and dry.  Neurological:     Mental Status: She is alert and oriented to person, place,  and time.  Psychiatric:        Mood and Affect: Mood normal.        Behavior: Behavior normal.        Thought Content: Thought content normal.        Judgment: Judgment normal.             Total Time 35 minutes   Time spent includes: Detailed review of medical records (labs, imaging, vital signs), medically appropriate exam (mental status, respiratory, cardiac, skin), discussed with treatment team, counseling and educating patient, family and staff, documenting clinical information, medication management and coordination of care.  Samara Deist L. Bonita Quin, DNP, FNP-BC Palliative Medicine Team

## 2023-08-24 NOTE — Plan of Care (Signed)
   Problem: Coping: Goal: Ability to adjust to condition or change in health will improve Outcome: Progressing   Problem: Metabolic: Goal: Ability to maintain appropriate glucose levels will improve Outcome: Progressing   Problem: Skin Integrity: Goal: Risk for impaired skin integrity will decrease Outcome: Progressing   Problem: Education: Goal: Knowledge of General Education information will improve Description: Including pain rating scale, medication(s)/side effects and non-pharmacologic comfort measures Outcome: Progressing

## 2023-08-24 NOTE — Progress Notes (Signed)
 PROGRESS NOTE    Taylor Chambers   ZOX:[id number] DOB: 21-Feb-1946  DOA: 08/21/2023 Date of Service: 08/24/23 which is hospital day 3  PCP: Mick Sell, MD    Hospital course / significant events:   HPI: 78 year old female with history of COPD, hyperlipidemia, heart failure preserved ejection fraction, hypertension, depression, who presents emergency department for chief concerns of respiratory distress via EMS. Per ED nursing documentation, patient was on CPAP on arrival and has labored breathing.   Of note, patient was admitted 1/18 - 06/28/2023 for AECOPD, A-fib with RVR, and CHF exacerbation and returned the next day 1/23 was discharged on 1/27 for acute on chronic respiratory failure with hypoxia and hypercapnia.   03/17: to ED, admitted to hospitalist service. BiPap --. 4L [place]. Chest x-ray favored multifocal pneumonia, doxy initiated, given 1L bolus. Started steroids. Afib RVR, started  3/18: Palliative care consult. Continued IV fluids.  3/19: Overnight AfibRVR, started amiodarone gtt. HR remained elevated, Cardiology consult. 4L O2 --> back on BiPap, lasix. Converted to NSR later this afternoon.  3/20: good UOP and off BiPap, still needing 6L O2 per [place]. Continue diuresing. HR has been controlled      Consultants:  Palliative care Cardiology   Procedures/Surgeries: none      ASSESSMENT & PLAN:   Severe sepsis with acute organ dysfunction  Question solely sepsis d/t PNA +/- SIRS component d/t CHF/COPD Treat underlying cause(s) as below Repeat CXR in AM 08/24/23 --> no significant improvement (personally reviewed and radiology read)   CAP w/ elevation procalcitonin  Continue ceftriaxone 2 g IV daily to complete a 5-day course As patient has recently been prescribed azithromycin, start doxycycline 100 mg IV twice daily, to complete a 5-day course Blood cultures x 2 are neg thus far   COPD continue steroid burst w/ prednisone Supplemental O2  Scheduled and  PRN DuoNebs  HFpEF Elevated BNP but this appears chronic Recent Echo, EF 55-60, G2DD, mod-severe MR - will not repeat Echo  Lasix Strict I&O  Cardiology to follow   A-fib Rapid  Eliquis 5 mg p.o. twice daily Amiodarone drip titrating - cardiology to follow as this was not working to control HR but now has converted to SR Cardiology recs transition to po amiodarone tomorrow  Metroptolol succinate 25 mg bid po  Hypokalemia Replace as needed Monitor BMP  Depression Home sertraline 100 mg daily resumed   Hyperlipidemia Home pravastatin 10 mg daily resumed   Leukocytosis Likely mix if d/t infection + steroids  Treat per above, recheck CBC in a.m.   Protein-calorie malnutrition, severe (HCC) Suspect moderate to severe, query severe COPD cachexia   Benign essential hypertension Hydralazine 5 mg IV every 6 hours as needed for SBP greater 165, 5 days ordered   Goals of care Palliative care consult    underweight based on BMI: Body mass index is 18.47 kg/m.  Underweight - under 18  overweight - 25 to 29 obese - 30 or more Class 1 obesity: BMI of 30.0 to 34 Class 2 obesity: BMI of 35.0 to 39 Class 3 obesity: BMI of 40.0 to 49 Super Morbid Obesity: BMI 50-59 Super-super Morbid Obesity: BMI 60+ Significantly low or high BMI is associated with higher medical risk.  Weight management advised as adjunct to other disease management and risk reduction treatments    DVT prophylaxis: eliquis IV fluids: none Nutrition: cardiac diet  Central lines / other devices: none  Code Status: DNR ACP documentation reviewed: none on file in [facility]  TOC needs: TBD Medical barriers to dispo: increased O2 requirement, diuresing, cardiology to see. Expected medical readiness for discharge few more days.              Subjective / Brief ROS:  Patient reports SOB is much better today Happy to be off BiPap Denies CP, no palpitations Pain controlled.  Denies new weakness.   Tolerating diet.  Reports no concerns w/ urination/defecation.   Family Communication: family at bedside on rounds     Objective Findings:  Vitals:   08/24/23 0400 08/24/23 0410 08/24/23 0800 08/24/23 1012  BP: (!) 146/74  (!) 160/77 (!) 141/72  Pulse: 65  69 78  Resp: 15 (!) 27 20 (!) 22  Temp: 98 F (36.7 C)  98.5 F (36.9 C)   TempSrc: Axillary  Axillary   SpO2: 99%  99% 98%  Weight:      Height:        Intake/Output Summary (Last 24 hours) at 08/24/2023 1217 Last data filed at 08/24/2023 1115 Gross per 24 hour  Intake 1638.15 ml  Output 2200 ml  Net -561.85 ml   Filed Weights   08/21/23 0900  Weight: 45.8 kg    Examination:  Physical Exam Constitutional:      General: She is not in acute distress.    Appearance: She is not ill-appearing.  Cardiovascular:     Rate and Rhythm: Normal rate and regular rhythm.  Pulmonary:     Effort: No respiratory distress.     Breath sounds: Normal breath sounds.  Abdominal:     Palpations: Abdomen is soft.  Musculoskeletal:     Right lower leg: No edema.     Left lower leg: No edema.  Neurological:     Mental Status: She is alert and oriented to person, place, and time. Mental status is at baseline.  Psychiatric:        Mood and Affect: Mood normal.        Behavior: Behavior normal.          Scheduled Medications:   apixaban  5 mg Oral BID   aspirin EC  81 mg Oral Daily   cholecalciferol  5,000 Units Oral Q1500   feeding supplement  237 mL Oral BID BM   furosemide  40 mg Intravenous Daily   insulin aspart  0-9 Units Subcutaneous TID WC   levalbuterol  0.63 mg Nebulization TID   losartan  100 mg Oral Daily   metoprolol succinate  25 mg Oral BID   multivitamin with minerals  1 tablet Oral Daily   potassium chloride SA  20 mEq Oral BID   pravastatin  10 mg Oral q1800   predniSONE  50 mg Oral Q breakfast   sertraline  100 mg Oral Daily    Continuous Infusions:  amiodarone 30 mg/hr (08/24/23 1100)    cefTRIAXone (ROCEPHIN)  IV 2 g (08/24/23 1121)   doxycycline (VIBRAMYCIN) IV Stopped (08/24/23 1101)    PRN Medications:  acetaminophen **OR** acetaminophen, haloperidol lactate, hydrALAZINE, LORazepam, ondansetron **OR** ondansetron (ZOFRAN) IV, pantoprazole, senna-docusate  Antimicrobials from admission:  Anti-infectives (From admission, onward)    Start     Dose/Rate Route Frequency Ordered Stop   08/22/23 1000  cefTRIAXone (ROCEPHIN) 2 g in sodium chloride 0.9 % 100 mL IVPB        2 g 200 mL/hr over 30 Minutes Intravenous Every 24 hours 08/21/23 1254 08/26/23 0959   08/21/23 2100  doxycycline (VIBRAMYCIN) 100 mg in sodium chloride 0.9 %  250 mL IVPB        100 mg 125 mL/hr over 120 Minutes Intravenous Every 12 hours 08/21/23 1258 08/26/23 2059   08/21/23 1100  cefTRIAXone (ROCEPHIN) 2 g in sodium chloride 0.9 % 100 mL IVPB  Status:  Discontinued        2 g 200 mL/hr over 30 Minutes Intravenous Once 08/21/23 1049 08/21/23 1049   08/21/23 1100  azithromycin (ZITHROMAX) 500 mg in sodium chloride 0.9 % 250 mL IVPB  Status:  Discontinued        500 mg 250 mL/hr over 60 Minutes Intravenous  Once 08/21/23 1049 08/21/23 1049   08/21/23 0930  doxycycline (VIBRAMYCIN) 100 mg in sodium chloride 0.9 % 250 mL IVPB        100 mg 125 mL/hr over 120 Minutes Intravenous  Once 08/21/23 0857 08/21/23 1129   08/21/23 0900  cefTRIAXone (ROCEPHIN) 1 g in sodium chloride 0.9 % 100 mL IVPB        1 g 200 mL/hr over 30 Minutes Intravenous  Once 08/21/23 0857 08/21/23 1027           Data Reviewed:  I have personally reviewed the following...  CBC: Recent Labs  Lab 08/21/23 0908 08/22/23 0717 08/23/23 0245 08/24/23 0456  WBC 29.4* 24.9* 26.7* 25.8*  NEUTROABS 22.5*  --   --   --   HGB 13.3 10.7* 11.7* 12.0  HCT 41.8 33.9* 35.6* 36.3  MCV 107.5* 106.9* 102.9* 101.7*  PLT 630* 421* 467* 391   Basic Metabolic Panel: Recent Labs  Lab 08/21/23 0908 08/22/23 0717 08/23/23 0245  08/24/23 0456  NA 141 142 138 142  K 5.2* 3.6 3.8 3.0*  CL 108 112* 110 105  CO2 20* 24 20* 26  GLUCOSE 327* 87 116* 92  BUN 32* 21 17 14   CREATININE 0.83 0.64 0.55 0.39*  CALCIUM 8.7* 7.8* 8.0* 7.9*  MG  --   --  2.1  --   PHOS  --   --  2.0* 1.7*   GFR: Estimated Creatinine Clearance: 43.3 mL/min (A) (by C-G formula based on SCr of 0.39 mg/dL (L)). Liver Function Tests: Recent Labs  Lab 08/21/23 0908  AST 38  ALT 49*  ALKPHOS 75  BILITOT 0.7  PROT 6.3*  ALBUMIN 3.0*   No results for input(s): "LIPASE", "AMYLASE" in the last 168 hours. No results for input(s): "AMMONIA" in the last 168 hours. Coagulation Profile: Recent Labs  Lab 08/21/23 0908 08/22/23 0717  INR 1.2 1.5*   Cardiac Enzymes: No results for input(s): "CKTOTAL", "CKMB", "CKMBINDEX", "TROPONINI" in the last 168 hours. BNP (last 3 results) No results for input(s): "PROBNP" in the last 8760 hours. HbA1C: No results for input(s): "HGBA1C" in the last 72 hours. CBG: Recent Labs  Lab 08/23/23 0818 08/23/23 1301 08/23/23 1726 08/23/23 2131 08/24/23 0806  GLUCAP 98 114* 156* 105* 90   Lipid Profile: No results for input(s): "CHOL", "HDL", "LDLCALC", "TRIG", "CHOLHDL", "LDLDIRECT" in the last 72 hours. Thyroid Function Tests: No results for input(s): "TSH", "T4TOTAL", "FREET4", "T3FREE", "THYROIDAB" in the last 72 hours. Anemia Panel: No results for input(s): "VITAMINB12", "FOLATE", "FERRITIN", "TIBC", "IRON", "RETICCTPCT" in the last 72 hours. Most Recent Urinalysis On File:     Component Value Date/Time   COLORURINE YELLOW (A) 06/29/2023 1448   APPEARANCEUR CLOUDY (A) 06/29/2023 1448   LABSPEC 1.029 06/29/2023 1448   PHURINE 5.0 06/29/2023 1448   GLUCOSEU NEGATIVE 06/29/2023 1448   HGBUR NEGATIVE 06/29/2023 1448   BILIRUBINUR  NEGATIVE 06/29/2023 1448   KETONESUR NEGATIVE 06/29/2023 1448   PROTEINUR >=300 (A) 06/29/2023 1448   NITRITE NEGATIVE 06/29/2023 1448   LEUKOCYTESUR NEGATIVE  06/29/2023 1448   Sepsis Labs: @LABRCNTIP (procalcitonin:4,lacticidven:4) Microbiology: Recent Results (from the past 240 hours)  Resp panel by RT-PCR (RSV, Flu A&B, Covid) Anterior Nasal Swab     Status: None   Collection Time: 08/21/23  9:08 AM   Specimen: Anterior Nasal Swab  Result Value Ref Range Status   SARS Coronavirus 2 by RT PCR NEGATIVE NEGATIVE Final    Comment: (NOTE) SARS-CoV-2 target nucleic acids are NOT DETECTED.  The SARS-CoV-2 RNA is generally detectable in upper respiratory specimens during the acute phase of infection. The lowest concentration of SARS-CoV-2 viral copies this assay can detect is 138 copies/mL. A negative result does not preclude SARS-Cov-2 infection and should not be used as the sole basis for treatment or other patient management decisions. A negative result may occur with  improper specimen collection/handling, submission of specimen other than nasopharyngeal swab, presence of viral mutation(s) within the areas targeted by this assay, and inadequate number of viral copies(<138 copies/mL). A negative result must be combined with clinical observations, patient history, and epidemiological information. The expected result is Negative.  Fact Sheet for Patients:  BloggerCourse.com  Fact Sheet for Healthcare Providers:  SeriousBroker.it  This test is no t yet approved or cleared by the [place] FDA and  has been authorized for detection and/or diagnosis of SARS-CoV-2 by FDA under an Emergency Use Authorization (EUA). This EUA will remain  in effect (meaning this test can be used) for the duration of the COVID-19 declaration under Section 564(b)(1) of the Act, 21 U.S.C.section 360bbb-3(b)(1), unless the authorization is terminated  or revoked sooner.       Influenza A by PCR NEGATIVE NEGATIVE Final   Influenza B by PCR NEGATIVE NEGATIVE Final    Comment: (NOTE) The Xpert Xpress  SARS-CoV-2/FLU/RSV plus assay is intended as an aid in the diagnosis of influenza from Nasopharyngeal swab specimens and should not be used as a sole basis for treatment. Nasal washings and aspirates are unacceptable for Xpert Xpress SARS-CoV-2/FLU/RSV testing.  Fact Sheet for Patients: BloggerCourse.com  Fact Sheet for Healthcare Providers: SeriousBroker.it  This test is not yet approved or cleared by the [place] FDA and has been authorized for detection and/or diagnosis of SARS-CoV-2 by FDA under an Emergency Use Authorization (EUA). This EUA will remain in effect (meaning this test can be used) for the duration of the COVID-19 declaration under Section 564(b)(1) of the Act, 21 U.S.C. section 360bbb-3(b)(1), unless the authorization is terminated or revoked.     Resp Syncytial Virus by PCR NEGATIVE NEGATIVE Final    Comment: (NOTE) Fact Sheet for Patients: BloggerCourse.com  Fact Sheet for Healthcare Providers: SeriousBroker.it  This test is not yet approved or cleared by the [place] FDA and has been authorized for detection and/or diagnosis of SARS-CoV-2 by FDA under an Emergency Use Authorization (EUA). This EUA will remain in effect (meaning this test can be used) for the duration of the COVID-19 declaration under Section 564(b)(1) of the Act, 21 U.S.C. section 360bbb-3(b)(1), unless the authorization is terminated or revoked.  Performed at [facility], [address] [place], [place] [address]   Culture, blood (routine x 2)     Status: None (Preliminary result)   Collection Time: 08/21/23 11:36 AM   Specimen: BLOOD RIGHT ARM  Result Value Ref Range Status   Specimen Description BLOOD RIGHT  ARM  Final   Special Requests   Final    BOTTLES DRAWN AEROBIC AND ANAEROBIC Blood Culture results may not be optimal due to an inadequate volume of blood  received in culture bottles   Culture   Final    NO GROWTH 3 DAYS Performed at [facility], [address] [place], [place] [address]    Report Status PENDING  Incomplete  Culture, blood (routine x 2)     Status: None (Preliminary result)   Collection Time: 08/21/23 11:36 AM   Specimen: BLOOD LEFT ARM  Result Value Ref Range Status   Specimen Description BLOOD LEFT ARM  Final   Special Requests   Final    BOTTLES DRAWN AEROBIC AND ANAEROBIC Blood Culture results may not be optimal due to an inadequate volume of blood received in culture bottles   Culture   Final    NO GROWTH 3 DAYS Performed at [facility], [address] [place], [place] [address]    Report Status PENDING  Incomplete  Respiratory (~20 pathogens) panel by PCR     Status: None   Collection Time: 08/23/23  4:54 AM   Specimen: Nasopharyngeal Swab; Respiratory  Result Value Ref Range Status   Adenovirus NOT DETECTED NOT DETECTED Final   Coronavirus 229E NOT DETECTED NOT DETECTED Final    Comment: (NOTE) The Coronavirus on the Respiratory Panel, DOES NOT test for the novel  Coronavirus (2019 nCoV)    Coronavirus HKU1 NOT DETECTED NOT DETECTED Final   Coronavirus NL63 NOT DETECTED NOT DETECTED Final   Coronavirus OC43 NOT DETECTED NOT DETECTED Final   Metapneumovirus NOT DETECTED NOT DETECTED Final   Rhinovirus / Enterovirus NOT DETECTED NOT DETECTED Final   Influenza A NOT DETECTED NOT DETECTED Final   Influenza B NOT DETECTED NOT DETECTED Final   Parainfluenza Virus 1 NOT DETECTED NOT DETECTED Final   Parainfluenza Virus 2 NOT DETECTED NOT DETECTED Final   Parainfluenza Virus 3 NOT DETECTED NOT DETECTED Final   Parainfluenza Virus 4 NOT DETECTED NOT DETECTED Final   Respiratory Syncytial Virus NOT DETECTED NOT DETECTED Final   Bordetella pertussis NOT DETECTED NOT DETECTED Final   Bordetella Parapertussis NOT DETECTED NOT DETECTED Final   Chlamydophila pneumoniae NOT DETECTED NOT DETECTED  Final   Mycoplasma pneumoniae NOT DETECTED NOT DETECTED Final    Comment: Performed at [facility], [address], [place], [place] [address]      Radiology Studies last 3 days: [facility] Chest [facility] 1 View Result Date: 08/24/2023 CLINICAL DATA:  CHF.  Pneumonia.  COPD. EXAM: PORTABLE CHEST 1 VIEW COMPARISON:  Chest radiograph dated 08/23/2023. FINDINGS: No interval change in bilateral, left greater than right, pulmonary opacities. Small bilateral pleural effusions again noted. No pneumothorax. Stable cardiac silhouette. No acute osseous pathology. IMPRESSION: 1. No interval change in bilateral, left greater than right, pulmonary opacities. 2. Small bilateral pleural effusions. Electronically Signed   By: Elgie Collard M.D.   On: 08/24/2023 09:33   DG Chest 1 View Result Date: 08/23/2023 CLINICAL DATA:  78 year old female with shortness of breath. Emphysema, pneumonia. EXAM: CHEST  1 VIEW COMPARISON:  Portable chest 08/21/2023 and earlier. FINDINGS: Portable AP upright view at 0409 hours. Stable large lung volumes. Normal cardiac size and mediastinal contours. Visualized tracheal air column is within normal limits. Coarse and nodular left greater than right upper lobe predominant airspace opacity has not significantly changed. No superimposed pneumothorax. No overt consolidation at this time. Small pleural effusions demonstrated  by CT in January may persist. Stable visualized osseous structures.  Paucity of bowel gas. IMPRESSION: Emphysema ([id number]) with left greater than right lung Pneumonia not significantly changed from 08/21/2023. Small pleural effusions suspected. Electronically Signed   By: Odessa Fleming M.D.   On: 08/23/2023 04:47   DG Chest Portable 1 View Result Date: 08/21/2023 CLINICAL DATA:  Shortness of breath. Respiratory distress. History of COPD. EXAM: PORTABLE CHEST 1 VIEW COMPARISON:  07/02/2023. FINDINGS: Bilateral lungs appear hyperexpanded and hyperlucent with coarse  bronchovascular markings, in keeping with COPD. There are superimposed diffuse alveolar and interstitial opacities overlying the left upper mid lung zone, upper portion of left lower lung zone and right upper lung zone, compatible with multilobar pneumonia. Follow-up to clearing is recommended. Bilateral lateral costophrenic angles are clear. No large pleural effusion or pneumothorax. Normal cardio-mediastinal silhouette. No acute osseous abnormalities. The soft tissues are within normal limits. IMPRESSION: *Findings favor multilobar pneumonia, as described above. Follow-up to clearing is recommended. *Underlying COPD. Electronically Signed   By: Jules Schick M.D.   On: 08/21/2023 09:31        Sunnie Nielsen, DO [facility] 08/24/2023, 12:17 PM    Dictation software may have been used to generate the above note. Typos may occur and escape review in typed/dictated notes. Please contact Dr Lyn Hollingshead directly for clarity if needed.  Staff may message me via secure chat in Epic  but this may not receive an immediate response,  please page me for urgent matters!  If 7PM-7AM, please contact night coverage [email or web address]

## 2023-08-24 NOTE — Care Management Important Message (Signed)
 Important Message  Patient Details  Name: Taylor Chambers MRN: [id number] Date of Birth: 1945-07-18   Important Message Given:  Yes - Medicare IM     Cristela Blue, CMA 08/24/2023, 12:04 PM

## 2023-08-24 NOTE — Plan of Care (Signed)
  Problem: Fluid Volume: Goal: Hemodynamic stability will improve Outcome: Not Progressing   Problem: Clinical Measurements: Goal: Diagnostic test results will improve Outcome: Not Progressing Goal: Signs and symptoms of infection will decrease Outcome: Not Progressing   Problem: Respiratory: Goal: Ability to maintain adequate ventilation will improve Outcome: Not Progressing   Problem: Education: Goal: Ability to describe self-care measures that may prevent or decrease complications (Diabetes Survival Skills Education) will improve Outcome: Not Progressing Goal: Individualized Educational Video(s) Outcome: Not Progressing   Problem: Coping: Goal: Ability to adjust to condition or change in health will improve Outcome: Not Progressing   Problem: Fluid Volume: Goal: Ability to maintain a balanced intake and output will improve Outcome: Not Progressing   Problem: Health Behavior/Discharge Planning: Goal: Ability to identify and utilize available resources and services will improve Outcome: Not Progressing Goal: Ability to manage health-related needs will improve Outcome: Not Progressing   Problem: Metabolic: Goal: Ability to maintain appropriate glucose levels will improve Outcome: Not Progressing   Problem: Nutritional: Goal: Maintenance of adequate nutrition will improve Outcome: Not Progressing Goal: Progress toward achieving an optimal weight will improve Outcome: Not Progressing   Problem: Skin Integrity: Goal: Risk for impaired skin integrity will decrease Outcome: Not Progressing   Problem: Tissue Perfusion: Goal: Adequacy of tissue perfusion will improve Outcome: Not Progressing   Problem: Education: Goal: Knowledge of General Education information will improve Description: Including pain rating scale, medication(s)/side effects and non-pharmacologic comfort measures Outcome: Not Progressing   Problem: Health Behavior/Discharge Planning: Goal: Ability  to manage health-related needs will improve Outcome: Not Progressing   Problem: Clinical Measurements: Goal: Ability to maintain clinical measurements within normal limits will improve Outcome: Not Progressing Goal: Will remain free from infection Outcome: Not Progressing Goal: Diagnostic test results will improve Outcome: Not Progressing Goal: Respiratory complications will improve Outcome: Not Progressing Goal: Cardiovascular complication will be avoided Outcome: Not Progressing   Problem: Activity: Goal: Risk for activity intolerance will decrease Outcome: Not Progressing   Problem: Nutrition: Goal: Adequate nutrition will be maintained Outcome: Not Progressing   Problem: Coping: Goal: Level of anxiety will decrease Outcome: Not Progressing   Problem: Elimination: Goal: Will not experience complications related to bowel motility Outcome: Not Progressing Goal: Will not experience complications related to urinary retention Outcome: Not Progressing   Problem: Pain Managment: Goal: General experience of comfort will improve and/or be controlled Outcome: Not Progressing   Problem: Safety: Goal: Ability to remain free from injury will improve Outcome: Not Progressing   Problem: Skin Integrity: Goal: Risk for impaired skin integrity will decrease Outcome: Not Progressing

## 2023-08-25 DIAGNOSIS — A419 Sepsis, unspecified organism: Secondary | ICD-10-CM | POA: Diagnosis not present

## 2023-08-25 DIAGNOSIS — R6520 Severe sepsis without septic shock: Secondary | ICD-10-CM | POA: Diagnosis not present

## 2023-08-25 DIAGNOSIS — I4891 Unspecified atrial fibrillation: Secondary | ICD-10-CM | POA: Diagnosis not present

## 2023-08-25 DIAGNOSIS — J9621 Acute and chronic respiratory failure with hypoxia: Secondary | ICD-10-CM | POA: Diagnosis not present

## 2023-08-25 DIAGNOSIS — J441 Chronic obstructive pulmonary disease with (acute) exacerbation: Secondary | ICD-10-CM | POA: Diagnosis not present

## 2023-08-25 DIAGNOSIS — I5033 Acute on chronic diastolic (congestive) heart failure: Secondary | ICD-10-CM | POA: Diagnosis not present

## 2023-08-25 LAB — LEGIONELLA PNEUMOPHILA SEROGP 1 UR AG: L. pneumophila Serogp 1 Ur Ag: NEGATIVE

## 2023-08-25 LAB — GLUCOSE, CAPILLARY
Glucose-Capillary: 204 mg/dL — ABNORMAL HIGH (ref 70–99)
Glucose-Capillary: 126 mg/dL — ABNORMAL HIGH (ref 70–99)
Glucose-Capillary: 202 mg/dL — ABNORMAL HIGH (ref 70–99)
Glucose-Capillary: 219 mg/dL — ABNORMAL HIGH (ref 70–99)

## 2023-08-25 LAB — BASIC METABOLIC PANEL
Anion gap: 8 (ref 5–15)
BUN: 19 mg/dL (ref 8–23)
CO2: 32 mmol/L (ref 22–32)
Calcium: 8.1 mg/dL — ABNORMAL LOW (ref 8.9–10.3)
Chloride: 103 mmol/L (ref 98–111)
Creatinine, Ser: 0.57 mg/dL (ref 0.44–1.00)
GFR, Estimated: >60 mL/min (ref >60)
Glucose, Bld: 108 mg/dL — ABNORMAL HIGH (ref 70–99)
Potassium: 3.7 mmol/L (ref 3.5–5.1)
Sodium: 143 mmol/L (ref 135–145)

## 2023-08-25 LAB — PHOSPHORUS: Phosphorus: 1 mg/dL — CL (ref 2.5–4.6)

## 2023-08-25 LAB — MAGNESIUM: Magnesium: 1.9 mg/dL (ref 1.7–2.4)

## 2023-08-25 MED ORDER — K PHOS MONO-SOD PHOS DI & MONO 155-852-130 MG PO TABS
500.0000 mg | ORAL_TABLET | Freq: Three times a day (TID) | ORAL | Status: AC
  Administered 2023-08-25 (×3): 500 mg via ORAL
  Filled 2023-08-25 (×4): qty 2

## 2023-08-25 NOTE — Plan of Care (Signed)
  Problem: Coping: Goal: Ability to adjust to condition or change in health will improve Outcome: Progressing   Problem: Education: Goal: Ability to describe self-care measures that may prevent or decrease complications (Diabetes Survival Skills Education) will improve Outcome: Progressing   Problem: Metabolic: Goal: Ability to maintain appropriate glucose levels will improve Outcome: Progressing   Problem: Skin Integrity: Goal: Risk for impaired skin integrity will decrease Outcome: Progressing   Problem: Tissue Perfusion: Goal: Adequacy of tissue perfusion will improve Outcome: Progressing

## 2023-08-25 NOTE — Progress Notes (Signed)
 PROGRESS NOTE    Taylor Chambers   XBM:[id number] DOB: [birth year]  DOA: 08/21/2023 Date of Service: 08/25/23 which is hospital day 4  PCP: Mick Sell, MD    Hospital course / significant events:   HPI: 78 year old female with history of COPD, hyperlipidemia, heart failure preserved ejection fraction, hypertension, depression, who presents emergency department for chief concerns of respiratory distress via EMS. Per ED nursing documentation, patient was on CPAP on arrival and has labored breathing.   Of note, patient was admitted 1/18 - 06/28/2023 for AECOPD, A-fib with RVR, and CHF exacerbation and returned the next day 1/23 was discharged on 1/27 for acute on chronic respiratory failure with hypoxia and hypercapnia.   03/17: to ED, admitted to hospitalist service. BiPap --. 4L [place]. Chest x-ray favored multifocal pneumonia, doxy initiated, given 1L bolus. Started steroids. Afib RVR, started  3/18: Palliative care consult. Continued IV fluids.  3/19: Overnight AfibRVR, started amiodarone gtt. HR remained elevated, Cardiology consult. 4L O2 --> back on BiPap, lasix. Converted to NSR later this afternoon.  3/20: good UOP and off BiPap, still needing 6L O2 per [place]. Continue diuresing. HR has been controlled  3/21: walked w/ PT and needed BiPap but then back on [place]> Reports BiPap helps w/ SOB. PAF overnight up to 150's --> Currently NSR 70s      Consultants:  Palliative care Cardiology   Procedures/Surgeries: none      ASSESSMENT & PLAN:   Severe sepsis with acute organ dysfunction  Question solely sepsis d/t PNA +/- SIRS component d/t CHF/COPD Treat underlying cause(s) as below Repeat CXR in AM 08/25/23 --> no significant improvement (personally reviewed and radiology read)   CAP w/ elevation procalcitonin  Continue ceftriaxone 2 g IV daily to complete a 5-day course As patient has recently been prescribed azithromycin, start doxycycline 100 mg IV twice daily, to  complete a 5-day course Blood cultures x 2 are neg thus far   COPD exacerbation d/t CAP continue steroid burst w/ prednisone Supplemental O2  Scheduled and PRN DuoNebs  HFpEF acute on chronic  Elevated BNP but this appears chronic Recent Echo, EF 55-60, G2DD, mod-severe MR - will not repeat Echo  Lasix IV to continue today likely to po tomorrow  Strict I&O  Cardiology to follow   A-fib Rapid, paroxysmal  Eliquis 5 mg p.o. twice daily Amiodarone drip --> Cardiology to manage transition to po amiodarone  Metroptolol succinate 25 mg bid po  Demand ischemia Elevated troponin on admission No further workup per cardiology  Moderate MR TEE in November of 2024 showed moderate MR.  Continue to monitor outpatient.  Hypokalemia Replace as needed Monitor BMP  Depression Home sertraline 100 mg daily resumed   Hyperlipidemia Home pravastatin 10 mg daily resumed   Leukocytosis Likely infection + steroids  Treat per above   Protein-calorie malnutrition, severe Suspect moderate to severe, query severe COPD cachexia   Benign essential hypertension Meds as above    Goals of care Palliative care consult      underweight based on BMI: Body mass index is 18.47 kg/m.  Underweight - under 18  overweight - 25 to 29 obese - 30 or more Class 1 obesity: BMI of 30.0 to 34 Class 2 obesity: BMI of 35.0 to 39 Class 3 obesity: BMI of 40.0 to 49 Super Morbid Obesity: BMI 50-59 Super-super Morbid Obesity: BMI 60+ Significantly low or high BMI is associated with higher medical risk.  Weight management advised as adjunct to other disease management  and risk reduction treatments    DVT prophylaxis: eliquis IV fluids: none Nutrition: cardiac diet  Central lines / other devices: none  Code Status: DNR ACP documentation reviewed: none on file in [facility]  [facility] needs: home health PT/OT Medical barriers to dispo: O2 requirement, diuresing, cardiology to see. Expected medical readiness  for discharge next 2-3 days if stable on po meds              Subjective / Brief ROS:  Patient reports SOB is much better today Happy to be off BiPap but still reports needing this which helps SOB Asks about BiPap at home  Denies CP, no palpitations Pain controlled.  Denies new weakness.  Tolerating diet.  Reports no concerns w/ urination/defecation.   Family Communication: family at bedside on rounds     Objective Findings:  Vitals:   08/25/23 0130 08/25/23 0135 08/25/23 0400 08/25/23 1230  BP:   (!) 141/90 121/68  Pulse: (!) 137 (!) 121 77 83  Resp: (!) 26 (!) 22 (!) 26   Temp:   98.2 F (36.8 C) 98.1 F (36.7 C)  TempSrc:   Axillary Oral  SpO2: 100% 100% 99%   Weight:      Height:        Intake/Output Summary (Last 24 hours) at 08/25/2023 1428 Last data filed at 08/25/2023 1300 Gross per 24 hour  Intake 691.46 ml  Output 1800 ml  Net -1108.54 ml   Filed Weights   08/21/23 0900  Weight: 45.8 kg    Examination:  Physical Exam Constitutional:      General: She is not in acute distress.    Appearance: She is not ill-appearing.  Cardiovascular:     Rate and Rhythm: Normal rate and regular rhythm.  Pulmonary:     Effort: No respiratory distress.     Breath sounds: Normal breath sounds.  Abdominal:     Palpations: Abdomen is soft.  Musculoskeletal:     Right lower leg: No edema.     Left lower leg: No edema.  Neurological:     Mental Status: She is alert and oriented to person, place, and time. Mental status is at baseline.  Psychiatric:        Mood and Affect: Mood normal.        Behavior: Behavior normal.          Scheduled Medications:   apixaban  5 mg Oral BID   aspirin EC  81 mg Oral Daily   cholecalciferol  5,000 Units Oral Q1500   furosemide  40 mg Intravenous BID   insulin aspart  0-9 Units Subcutaneous TID WC   lactose free nutrition  237 mL Oral TID WC   levalbuterol  0.63 mg Nebulization TID   losartan  100 mg Oral Daily    metoprolol succinate  25 mg Oral BID   multivitamin with minerals  1 tablet Oral Daily   phosphorus  500 mg Oral TID   potassium chloride SA  40 mEq Oral BID   pravastatin  10 mg Oral q1800   predniSONE  50 mg Oral Q breakfast   sertraline  100 mg Oral Daily    Continuous Infusions:  amiodarone 30 mg/hr (08/25/23 0606)   doxycycline (VIBRAMYCIN) IV 100 mg (08/25/23 0950)    PRN Medications:  acetaminophen **OR** acetaminophen, haloperidol lactate, hydrALAZINE, LORazepam, ondansetron **OR** ondansetron (ZOFRAN) IV, pantoprazole, senna-docusate  Antimicrobials from admission:  Anti-infectives (From admission, onward)    Start     Dose/Rate  Route Frequency Ordered Stop   08/22/23 1000  cefTRIAXone (ROCEPHIN) 2 g in sodium chloride 0.9 % 100 mL IVPB        2 g 200 mL/hr over 30 Minutes Intravenous Every 24 hours 08/21/23 1254 08/25/23 0935   08/21/23 2100  doxycycline (VIBRAMYCIN) 100 mg in sodium chloride 0.9 % 250 mL IVPB        100 mg 125 mL/hr over 120 Minutes Intravenous Every 12 hours 08/21/23 1258 08/26/23 2059   08/21/23 1100  cefTRIAXone (ROCEPHIN) 2 g in sodium chloride 0.9 % 100 mL IVPB  Status:  Discontinued        2 g 200 mL/hr over 30 Minutes Intravenous Once 08/21/23 1049 08/21/23 1049   08/21/23 1100  azithromycin (ZITHROMAX) 500 mg in sodium chloride 0.9 % 250 mL IVPB  Status:  Discontinued        500 mg 250 mL/hr over 60 Minutes Intravenous  Once 08/21/23 1049 08/21/23 1049   08/21/23 0930  doxycycline (VIBRAMYCIN) 100 mg in sodium chloride 0.9 % 250 mL IVPB        100 mg 125 mL/hr over 120 Minutes Intravenous  Once 08/21/23 0857 08/21/23 1129   08/21/23 0900  cefTRIAXone (ROCEPHIN) 1 g in sodium chloride 0.9 % 100 mL IVPB        1 g 200 mL/hr over 30 Minutes Intravenous  Once 08/21/23 0857 08/21/23 5956           Data Reviewed:  I have personally reviewed the following...  CBC: Recent Labs  Lab 08/21/23 0908 08/22/23 0717 08/23/23 0245  08/24/23 0456  WBC 29.4* 24.9* 26.7* 25.8*  NEUTROABS 22.5*  --   --   --   HGB 13.3 10.7* 11.7* 12.0  HCT 41.8 33.9* 35.6* 36.3  MCV 107.5* 106.9* 102.9* 101.7*  PLT 630* 421* 467* 391   Basic Metabolic Panel: Recent Labs  Lab 08/21/23 0908 08/22/23 0717 08/23/23 0245 08/24/23 0456 08/25/23 0523  NA 141 142 138 142 143  K 5.2* 3.6 3.8 3.0* 3.7  CL 108 112* 110 105 103  CO2 20* 24 20* 26 32  GLUCOSE 327* 87 116* 92 108*  BUN 32* 21 17 14 19   CREATININE 0.83 0.64 0.55 0.39* 0.57  CALCIUM 8.7* 7.8* 8.0* 7.9* 8.1*  MG  --   --  2.1  --  1.9  PHOS  --   --  2.0* 1.7* 1.0*   GFR: Estimated Creatinine Clearance: 43.3 mL/min (by C-G formula based on SCr of 0.57 mg/dL). Liver Function Tests: Recent Labs  Lab 08/21/23 0908  AST 38  ALT 49*  ALKPHOS 75  BILITOT 0.7  PROT 6.3*  ALBUMIN 3.0*   No results for input(s): "LIPASE", "AMYLASE" in the last 168 hours. No results for input(s): "AMMONIA" in the last 168 hours. Coagulation Profile: Recent Labs  Lab 08/21/23 0908 08/22/23 0717  INR 1.2 1.5*   Cardiac Enzymes: No results for input(s): "CKTOTAL", "CKMB", "CKMBINDEX", "TROPONINI" in the last 168 hours. BNP (last 3 results) No results for input(s): "PROBNP" in the last 8760 hours. HbA1C: No results for input(s): "HGBA1C" in the last 72 hours. CBG: Recent Labs  Lab 08/24/23 1223 08/24/23 1624 08/24/23 2010 08/25/23 0822 08/25/23 1204  GLUCAP 172* 297* 202* 204* 126*   Lipid Profile: No results for input(s): "CHOL", "HDL", "LDLCALC", "TRIG", "CHOLHDL", "LDLDIRECT" in the last 72 hours. Thyroid Function Tests: No results for input(s): "TSH", "T4TOTAL", "FREET4", "T3FREE", "THYROIDAB" in the last 72 hours. Anemia Panel: No  results for input(s): "VITAMINB12", "FOLATE", "FERRITIN", "TIBC", "IRON", "RETICCTPCT" in the last 72 hours. Most Recent Urinalysis On File:     Component Value Date/Time   COLORURINE YELLOW (A) 06/29/2023 1448   APPEARANCEUR CLOUDY (A)  06/29/2023 1448   LABSPEC 1.029 06/29/2023 1448   PHURINE 5.0 06/29/2023 1448   GLUCOSEU NEGATIVE 06/29/2023 1448   HGBUR NEGATIVE 06/29/2023 1448   BILIRUBINUR NEGATIVE 06/29/2023 1448   KETONESUR NEGATIVE 06/29/2023 1448   PROTEINUR >=300 (A) 06/29/2023 1448   NITRITE NEGATIVE 06/29/2023 1448   LEUKOCYTESUR NEGATIVE 06/29/2023 1448   Sepsis Labs: @LABRCNTIP (procalcitonin:4,lacticidven:4) Microbiology: Recent Results (from the past 240 hours)  Resp panel by RT-PCR (RSV, Flu A&B, Covid) Anterior Nasal Swab     Status: None   Collection Time: 08/21/23  9:08 AM   Specimen: Anterior Nasal Swab  Result Value Ref Range Status   SARS Coronavirus 2 by RT PCR NEGATIVE NEGATIVE Final    Comment: (NOTE) SARS-CoV-2 target nucleic acids are NOT DETECTED.  The SARS-CoV-2 RNA is generally detectable in upper respiratory specimens during the acute phase of infection. The lowest concentration of SARS-CoV-2 viral copies this assay can detect is 138 copies/mL. A negative result does not preclude SARS-Cov-2 infection and should not be used as the sole basis for treatment or other patient management decisions. A negative result may occur with  improper specimen collection/handling, submission of specimen other than nasopharyngeal swab, presence of viral mutation(s) within the areas targeted by this assay, and inadequate number of viral copies(<138 copies/mL). A negative result must be combined with clinical observations, patient history, and epidemiological information. The expected result is Negative.  Fact Sheet for Patients:  BloggerCourse.com  Fact Sheet for Healthcare Providers:  SeriousBroker.it  This test is no t yet approved or cleared by the [place] FDA and  has been authorized for detection and/or diagnosis of SARS-CoV-2 by FDA under an Emergency Use Authorization (EUA). This EUA will remain  in effect (meaning this test can be  used) for the duration of the COVID-19 declaration under Section 564(b)(1) of the Act, 21 U.S.C.section 360bbb-3(b)(1), unless the authorization is terminated  or revoked sooner.       Influenza A by PCR NEGATIVE NEGATIVE Final   Influenza B by PCR NEGATIVE NEGATIVE Final    Comment: (NOTE) The Xpert Xpress SARS-CoV-2/FLU/RSV plus assay is intended as an aid in the diagnosis of influenza from Nasopharyngeal swab specimens and should not be used as a sole basis for treatment. Nasal washings and aspirates are unacceptable for Xpert Xpress SARS-CoV-2/FLU/RSV testing.  Fact Sheet for Patients: BloggerCourse.com  Fact Sheet for Healthcare Providers: SeriousBroker.it  This test is not yet approved or cleared by the [place] FDA and has been authorized for detection and/or diagnosis of SARS-CoV-2 by FDA under an Emergency Use Authorization (EUA). This EUA will remain in effect (meaning this test can be used) for the duration of the COVID-19 declaration under Section 564(b)(1) of the Act, 21 U.S.C. section 360bbb-3(b)(1), unless the authorization is terminated or revoked.     Resp Syncytial Virus by PCR NEGATIVE NEGATIVE Final    Comment: (NOTE) Fact Sheet for Patients: BloggerCourse.com  Fact Sheet for Healthcare Providers: SeriousBroker.it  This test is not yet approved or cleared by the [place] FDA and has been authorized for detection and/or diagnosis of SARS-CoV-2 by FDA under an Emergency Use Authorization (EUA). This EUA will remain in effect (meaning this test can be used) for the duration of the COVID-19 declaration under Section 564(b)(1)  of the Act, 21 U.S.C. section 360bbb-3(b)(1), unless the authorization is terminated or revoked.  Performed at [facility], [address] [place], [place] [address]   Culture, blood (routine x 2)     Status:  None (Preliminary result)   Collection Time: 08/21/23 11:36 AM   Specimen: BLOOD RIGHT ARM  Result Value Ref Range Status   Specimen Description BLOOD RIGHT ARM  Final   Special Requests   Final    BOTTLES DRAWN AEROBIC AND ANAEROBIC Blood Culture results may not be optimal due to an inadequate volume of blood received in culture bottles   Culture   Final    NO GROWTH 4 DAYS Performed at [facility], [address], [place], [place] [address]    Report Status PENDING  Incomplete  Culture, blood (routine x 2)     Status: None (Preliminary result)   Collection Time: 08/21/23 11:36 AM   Specimen: BLOOD LEFT ARM  Result Value Ref Range Status   Specimen Description BLOOD LEFT ARM  Final   Special Requests   Final    BOTTLES DRAWN AEROBIC AND ANAEROBIC Blood Culture results may not be optimal due to an inadequate volume of blood received in culture bottles   Culture   Final    NO GROWTH 4 DAYS Performed at [facility], [address] [place], [place] [address]    Report Status PENDING  Incomplete  Respiratory (~20 pathogens) panel by PCR     Status: None   Collection Time: 08/23/23  4:54 AM   Specimen: Nasopharyngeal Swab; Respiratory  Result Value Ref Range Status   Adenovirus NOT DETECTED NOT DETECTED Final   Coronavirus 229E NOT DETECTED NOT DETECTED Final    Comment: (NOTE) The Coronavirus on the Respiratory Panel, DOES NOT test for the novel  Coronavirus (2019 nCoV)    Coronavirus HKU1 NOT DETECTED NOT DETECTED Final   Coronavirus NL63 NOT DETECTED NOT DETECTED Final   Coronavirus OC43 NOT DETECTED NOT DETECTED Final   Metapneumovirus NOT DETECTED NOT DETECTED Final   Rhinovirus / Enterovirus NOT DETECTED NOT DETECTED Final   Influenza A NOT DETECTED NOT DETECTED Final   Influenza B NOT DETECTED NOT DETECTED Final   Parainfluenza Virus 1 NOT DETECTED NOT DETECTED Final   Parainfluenza Virus 2 NOT DETECTED NOT DETECTED Final   Parainfluenza Virus 3 NOT  DETECTED NOT DETECTED Final   Parainfluenza Virus 4 NOT DETECTED NOT DETECTED Final   Respiratory Syncytial Virus NOT DETECTED NOT DETECTED Final   Bordetella pertussis NOT DETECTED NOT DETECTED Final   Bordetella Parapertussis NOT DETECTED NOT DETECTED Final   Chlamydophila pneumoniae NOT DETECTED NOT DETECTED Final   Mycoplasma pneumoniae NOT DETECTED NOT DETECTED Final    Comment: Performed at [facility], [address] [place], [place] [address]      Radiology Studies last 3 days: [facility] 1 View Result Date: 08/24/2023 CLINICAL DATA:  CHF.  Pneumonia.  COPD. EXAM: PORTABLE CHEST 1 VIEW COMPARISON:  Chest radiograph dated 08/23/2023. FINDINGS: No interval change in bilateral, left greater than right, pulmonary opacities. Small bilateral pleural effusions again noted. No pneumothorax. Stable cardiac silhouette. No acute osseous pathology. IMPRESSION: 1. No interval change in bilateral, left greater than right, pulmonary opacities. 2. Small bilateral pleural effusions. Electronically Signed   By: Elgie Collard M.D.   On: 08/24/2023 09:33   DG Chest 1 View Result Date: 08/23/2023 CLINICAL DATA:  78 year old female with shortness of breath. Emphysema, pneumonia.  EXAM: CHEST  1 VIEW COMPARISON:  Portable chest 08/21/2023 and earlier. FINDINGS: Portable AP upright view at 0409 hours. Stable large lung volumes. Normal cardiac size and mediastinal contours. Visualized tracheal air column is within normal limits. Coarse and nodular left greater than right upper lobe predominant airspace opacity has not significantly changed. No superimposed pneumothorax. No overt consolidation at this time. Small pleural effusions demonstrated by CT in January may persist. Stable visualized osseous structures.  Paucity of bowel gas. IMPRESSION: Emphysema ([id number]) with left greater than right lung Pneumonia not significantly changed from 08/21/2023. Small pleural effusions suspected. Electronically  Signed   By: Odessa Fleming M.D.   On: 08/23/2023 04:47        Sunnie Nielsen, DO [facility] 08/25/2023, 2:28 PM    Dictation software may have been used to generate the above note. Typos may occur and escape review in typed/dictated notes. Please contact Dr Lyn Hollingshead directly for clarity if needed.  Staff may message me via secure chat in Epic  but this may not receive an immediate response,  please page me for urgent matters!  If 7PM-7AM, please contact night coverage [email or web address]

## 2023-08-25 NOTE — Evaluation (Signed)
 Occupational Therapy Evaluation Patient Details Name: Taylor Chambers MRN: [id number] DOB: 1945/12/27 Today's Date: 08/25/2023   History of Present Illness   Pt is a 78 year old female presenting to ED with concerns of respiratory distress; admitted with severe sepsis with acute organ dysfunction and AECOPD.    PMH significant for COPD, hyperlipidemia, heart failure preserved ejection fraction, hypertension, depression     Clinical Impressions Chart reviewed, pt greeted in room, family present, alert and oriented x4, agreeable to OT evaluation. Pt initially with bipap on, RN placed pt on 2L via [place] throughout with spo2 >90% throughout mobility. PTA Pt requires set up assist for ADL, assist for IADL, amb with rollator household distances. Pt presents with deficits in endurance, activity tolerance, balance, cardio/pulmonary status, affecting safe and optimal ADL completion. Pt reports she is anxious to attempt mobility as she gets SOB but is able to perform 2 STS with no AD with supervision-CGA. LB dressing completed with supervision, pt does get SOB with forward trunk flexion. Educated pt regarding energy conservation techniques for improved ADL performance. Pt will benefit from acute OT to address deficits and to facilitate optimal ADL completion. Pt is left in chair, all needs met. OT will follow acutely.      If plan is discharge home, recommend the following:   A little help with walking and/or transfers;A little help with bathing/dressing/bathroom;Help with stairs or ramp for entrance;Assistance with cooking/housework     Functional Status Assessment   Patient has had a recent decline in their functional status and demonstrates the ability to make significant improvements in function in a reasonable and predictable amount of time.     Equipment Recommendations   None recommended by OT (ATP assessment for power mobility device for community distances)     Recommendations for  Other Services         Precautions/Restrictions   Precautions Precautions: Fall Recall of Precautions/Restrictions: Intact Precaution/Restrictions Comments: watch spO2 Restrictions Weight Bearing Restrictions Per Provider Order: No     Mobility Bed Mobility               General bed mobility comments: Pt in recliner pre/post session    Transfers Overall transfer level: Needs assistance   Transfers: Sit to/from Stand Sit to Stand: Supervision, Contact guard assist           General transfer comment: STS with no AD, frequent vcs for energy conservation techniques      Balance Overall balance assessment: Needs assistance Sitting-balance support: Feet supported Sitting balance-Leahy Scale: Good     Standing balance support: No upper extremity supported Standing balance-Leahy Scale: Fair                             ADL either performed or assessed with clinical judgement   ADL Overall ADL's : Needs assistance/impaired     Grooming: Sitting;Set up               Lower Body Dressing: Set up;Sitting/lateral leans Lower Body Dressing Details (indicate cue type and reason): socks, pt SOB with trunk flexion Toilet Transfer: Supervision/safety;Contact guard assist Toilet Transfer Details (indicate cue type and reason): simulated         Functional mobility during ADLs:  (pt declines amb on this date, anxious re: mobility and SOB)       Vision Patient Visual Report: No change from baseline       Perception  Praxis         Pertinent Vitals/Pain Pain Assessment Pain Assessment: No/denies pain     Extremity/Trunk Assessment Upper Extremity Assessment Upper Extremity Assessment: Overall WFL for tasks assessed   Lower Extremity Assessment Lower Extremity Assessment: Defer to PT evaluation   Cervical / Trunk Assessment Cervical / Trunk Assessment: Normal   Communication Communication Communication: No apparent  difficulties   Cognition Arousal: Alert Behavior During Therapy: WFL for tasks assessed/performed, Anxious Cognition: No apparent impairments             OT - Cognition Comments: anxious with mobility                 Following commands: Intact       Cueing  General Comments   Cueing Techniques: Verbal cues  spo2 >90% on 2L via [place] throughout, educated pt on vital signs, energy conservation techniques during mobility   Exercises Other Exercises Other Exercises: edu re: role of OT, role of rehab, energy conservation techniques for increased ease of ADL completion; DME for improved MRADLs   Shoulder Instructions      Home Living Family/patient expects to be discharged to:: Private residence Living Arrangements: Spouse/significant other Available Help at Discharge: Family;Available 24 hours/day Type of Home: House Home Access: Stairs to enter [employer] of Steps: 18 steps, has a Theatre manager.   Home Layout: Two level;Able to live on main level with bedroom/bathroom     Bathroom Shower/Tub: Producer, television/film/video: Standard     Home Equipment: Shower seat;Grab bars - tub/shower;Hand held Programmer, systems (2 wheels);BSC/3in1          Prior Functioning/Environment Prior Level of Function : Needs assist             Mobility Comments: amb with rollator for short distances ADLs Comments: SET UP assit for ADL, assist for IADLs    OT Problem List: Decreased activity tolerance;Decreased knowledge of use of DME or AE;Cardiopulmonary status limiting activity;Decreased knowledge of precautions;Impaired balance (sitting and/or standing)   OT Treatment/Interventions: Self-care/ADL training;DME and/or AE instruction;Therapeutic activities;Balance training;Therapeutic exercise;Energy conservation;Patient/family education      OT Goals(Current goals can be found in the care plan section)   Acute Rehab OT Goals Patient Stated Goal:  go home OT Goal Formulation: With patient Time For Goal Achievement: 09/08/23 Potential to Achieve Goals: Good ADL Goals Pt Will Perform Grooming: with modified independence;sitting;standing Pt Will Perform Lower Body Dressing: with modified independence;sitting/lateral leans;sit to/from stand Pt Will Transfer to Toilet: with modified independence;ambulating Pt Will Perform Toileting - Clothing Manipulation and hygiene: with modified independence;sitting/lateral leans;sit to/from stand   OT Frequency:  Min 2X/week    Co-evaluation              AM-PAC OT "6 Clicks" Daily Activity     Outcome Measure Help from another person eating meals?: None Help from another person taking care of personal grooming?: None Help from another person toileting, which includes using toliet, bedpan, or urinal?: None Help from another person bathing (including washing, rinsing, drying)?: A Little Help from another person to put on and taking off regular upper body clothing?: None Help from another person to put on and taking off regular lower body clothing?: None 6 Click Score: 23   End of Session Equipment Utilized During Treatment: Oxygen Nurse Communication: Mobility status  Activity Tolerance: Other (comment);Treatment limited secondary to medical complications (Comment) (amb limited by anxiety on this date, pt agreeble to attempts in the future)  Patient left: in chair;with call bell/phone within reach;with family/visitor present;Other (comment) (cardiology in room)  OT Visit Diagnosis: Other abnormalities of gait and mobility (R26.89)                Time: 1610-9604 OT Time Calculation (min): 12 min Charges:  OT General Charges $OT Visit: 1 Visit OT Evaluation $OT Eval Low Complexity: 1 Low  Oleta Mouse, OTD OTR/L  08/25/23, 2:43 PM

## 2023-08-25 NOTE — Evaluation (Signed)
 Physical Therapy Evaluation Patient Details Name: Taylor Chambers MRN: [id number] DOB: 1945/09/24 Today's Date: 08/25/2023  History of Present Illness  Pt is a 78 y/o F that presented to ED for respiratory distress, was placed on bipap. Workup for afib with RVR and multifocal PNA, severe sepsis. PMH: COPD, CAD, a-fib, HFpEF, mitral insufficiency, HLD, HTN, tobacco abuse.  Clinical Impression  Patient A&Ox4, denied pain, family at bedside. Per pt report at baseline she lives with family, set up assist for ADLs, able to ambulate short household distances with walker, but does have a WC as well.   She was able to mobilize supine to sit, sit <> stand and step pivot with CGA and RW. Extended time needed for rest breaks due to respiratory status. Initially on bipap (per RN to be on bipap while resting/sleeping, nasal cannula during the day), transitioned to [place]. Pt vitals WFLs with activity (except RR, 30-48) but bipap replaced when pt transitioned to recliner for pt anxiety/comfort, RN notified.  Overall the patient demonstrated deficits (see "PT Problem List") that impede the patient's functional abilities, safety, and mobility and would benefit from skilled PT intervention.          If plan is discharge home, recommend the following: A little help with walking and/or transfers;A little help with bathing/dressing/bathroom;Assistance with cooking/housework;Assist for transportation;Help with stairs or ramp for entrance   Can travel by private vehicle        Equipment Recommendations None recommended by PT  Recommendations for Other Services       Functional Status Assessment Patient has had a recent decline in their functional status and demonstrates the ability to make significant improvements in function in a reasonable and predictable amount of time.     Precautions / Restrictions Precautions Precautions: Fall Precaution/Restrictions Comments: watch spO2 Restrictions Weight Bearing  Restrictions Per Provider Order: No      Mobility  Bed Mobility Overal bed mobility: Needs Assistance Bed Mobility: Supine to Sit     Supine to sit: Supervision, Used rails, HOB elevated          Transfers Overall transfer level: Needs assistance Equipment used: Rolling walker (2 wheels) Transfers: Sit to/from Stand, Bed to chair/wheelchair/BSC Sit to Stand: Contact guard assist   Step pivot transfers: Contact guard assist            Ambulation/Gait               General Gait Details: deferred due to respiratory status  Stairs            Wheelchair Mobility     Tilt Bed    Modified Rankin (Stroke Patients Only)       Balance Overall balance assessment: Needs assistance Sitting-balance support: Feet supported Sitting balance-Leahy Scale: Good     Standing balance support: Bilateral upper extremity supported Standing balance-Leahy Scale: Fair                               Pertinent Vitals/Pain Pain Assessment Pain Assessment: No/denies pain    Home Living Family/patient expects to be discharged to:: Private residence Living Arrangements: Spouse/significant other Available Help at Discharge: Family;Available 24 hours/day Type of Home: House Home Access: Stairs to enter   [employer] of Steps: 18 steps, has a Theatre manager.   Home Layout: Two level;Able to live on main level with bedroom/bathroom Home Equipment: Shower seat;Grab bars - tub/shower;Hand held shower head;Rolling Walker (2 wheels);BSC/3in1  Prior Function Prior Level of Function : Independent/Modified Independent             Mobility Comments: pt reported ambulatory at home with her rollator for short household distances ADLs Comments: set up assist for ADLs     Extremity/Trunk Assessment   Upper Extremity Assessment Upper Extremity Assessment: Generalized weakness    Lower Extremity Assessment Lower Extremity Assessment: Generalized  weakness    Cervical / Trunk Assessment Cervical / Trunk Assessment: Normal  Communication        Cognition Arousal: Alert Behavior During Therapy: WFL for tasks assessed/performed   PT - Cognitive impairments: No apparent impairments                                 Cueing       General Comments      Exercises     Assessment/Plan    PT Assessment Patient needs continued PT services  PT Problem List Decreased strength;Pain;Decreased range of motion;Decreased knowledge of use of DME;Decreased activity tolerance;Decreased balance;Decreased mobility;Decreased knowledge of precautions       PT Treatment Interventions DME instruction;Balance training;Gait training;Neuromuscular re-education;Stair training;Functional mobility training;Patient/family education;Therapeutic activities;Therapeutic exercise;Wheelchair mobility training    PT Goals (Current goals can be found in the Care Plan section)  Acute Rehab PT Goals Patient Stated Goal: to go home PT Goal Formulation: With patient Time For Goal Achievement: 09/08/23 Potential to Achieve Goals: Fair    Frequency Min 2X/week     Co-evaluation               AM-PAC PT "6 Clicks" Mobility  Outcome Measure Help needed turning from your back to your side while in a flat bed without using bedrails?: A Little Help needed moving from lying on your back to sitting on the side of a flat bed without using bedrails?: A Little Help needed moving to and from a bed to a chair (including a wheelchair)?: A Little Help needed standing up from a chair using your arms (e.g., wheelchair or bedside chair)?: A Little Help needed to walk in hospital room?: A Lot Help needed climbing 3-5 steps with a railing? : A Lot 6 Click Score: 16    End of Session Equipment Utilized During Treatment: Gait belt Activity Tolerance: Patient tolerated treatment well Patient left: with call bell/phone within reach;in chair;with  family/visitor present Nurse Communication: Mobility status PT Visit Diagnosis: Other abnormalities of gait and mobility (R26.89);Difficulty in walking, not elsewhere classified (R26.2);Muscle weakness (generalized) (M62.81)    Time: 1610-9604 PT Time Calculation (min) (ACUTE ONLY): 23 min   Charges:   PT Evaluation $PT Eval Low Complexity: 1 Low PT Treatments $Therapeutic Activity: 8-22 mins PT General Charges $$ ACUTE PT VISIT: 1 Visit       Olga Coaster PT, DPT 11:29 AM,08/25/23

## 2023-08-25 NOTE — Progress Notes (Signed)
 Cardiology Progress Note   Patient Name: Taylor Chambers Date of Encounter: 08/25/2023  Primary Cardiologist: Lorine Bears, MD  Subjective   [facility] w/ PT earlier and became quite tired - needed BiPAP afterward but now back on nasal cannula.  Breathing improving but still not at baseline.  Noted good response to lasix yesterday.  Recurrent PAF overnight, though she was unaware.  Objective   Inpatient Medications    Scheduled Meds:  apixaban  5 mg Oral BID   aspirin EC  81 mg Oral Daily   cholecalciferol  5,000 Units Oral Q1500   furosemide  40 mg Intravenous BID   insulin aspart  0-9 Units Subcutaneous TID WC   lactose free nutrition  237 mL Oral TID WC   levalbuterol  0.63 mg Nebulization TID   losartan  100 mg Oral Daily   metoprolol succinate  25 mg Oral BID   multivitamin with minerals  1 tablet Oral Daily   phosphorus  500 mg Oral TID   potassium chloride SA  40 mEq Oral BID   pravastatin  10 mg Oral q1800   predniSONE  50 mg Oral Q breakfast   sertraline  100 mg Oral Daily   Continuous Infusions:  amiodarone 30 mg/hr (08/25/23 0606)   doxycycline (VIBRAMYCIN) IV 100 mg (08/25/23 0950)   PRN Meds: acetaminophen **OR** acetaminophen, haloperidol lactate, hydrALAZINE, LORazepam, ondansetron **OR** ondansetron (ZOFRAN) IV, pantoprazole, senna-docusate   Vital Signs    Vitals:   08/25/23 0121 08/25/23 0130 08/25/23 0135 08/25/23 0400  BP: (!) 117/93   (!) 141/90  Pulse: (!) 145 (!) 137 (!) 121 77  Resp: (!) 29 (!) 26 (!) 22 (!) 26  Temp:    98.2 F (36.8 C)  TempSrc:    Axillary  SpO2: 100% 100% 100% 99%  Weight:      Height:        Intake/Output Summary (Last 24 hours) at 08/25/2023 1122 Last data filed at 08/25/2023 0400 Gross per 24 hour  Intake 1073.15 ml  Output 1800 ml  Net -726.85 ml   Filed Weights   08/21/23 0900  Weight: 45.8 kg    Physical Exam   GEN: thin, frail, in no acute distress.  HEENT: Grossly normal.  Neck: Supple, no JVD,  carotid bruits, or masses. Cardiac: RRR, no murmurs, rubs, or gallops. No clubbing, cyanosis, edema.  Radials 2+, DP/PT 2+ and equal bilaterally.  Respiratory:  Respirations regular and unlabored, markedly diminished breath sounds bilat - very little air mvmt. GI: Soft, nontender, nondistended, BS + x 4. MS: no deformity or atrophy. Skin: warm and dry, no rash. Neuro:  Strength and sensation are intact. Psych: AAOx3.  Normal affect.  Labs    Chemistry Recent Labs  Lab 08/21/23 0908 08/22/23 0717 08/23/23 0245 08/24/23 0456 08/25/23 0523  NA 141   < > 138 142 143  K 5.2*   < > 3.8 3.0* 3.7  CL 108   < > 110 105 103  CO2 20*   < > 20* 26 32  GLUCOSE 327*   < > 116* 92 108*  BUN 32*   < > 17 14 19   CREATININE 0.83   < > 0.55 0.39* 0.57  CALCIUM 8.7*   < > 8.0* 7.9* 8.1*  PROT 6.3*  --   --   --   --   ALBUMIN 3.0*  --   --   --   --   AST 38  --   --   --   --  ALT 49*  --   --   --   --   ALKPHOS 75  --   --   --   --   BILITOT 0.7  --   --   --   --   GFRNONAA >60   < > >60 >60 >60  ANIONGAP 13   < > 8 11 8    < > = values in this interval not displayed.     Hematology Recent Labs  Lab 08/22/23 0717 08/23/23 0245 08/24/23 0456  WBC 24.9* 26.7* 25.8*  RBC 3.17* 3.46* 3.57*  HGB 10.7* 11.7* 12.0  HCT 33.9* 35.6* 36.3  MCV 106.9* 102.9* 101.7*  MCH 33.8 33.8 33.6  MCHC 31.6 32.9 33.1  RDW 13.9 14.1 13.8  PLT 421* 467* 391    Cardiac Enzymes  Recent Labs  Lab 08/21/23 0908 08/21/23 1136  TROPONINIHS 24* 51*      BNP    Component Value Date/Time   BNP 1,377.0 (H) 08/23/2023 0245    Lipids  Lab Results  Component Value Date   CHOL 159 05/16/2023   HDL 64 05/16/2023   LDLCALC 74 05/16/2023   LDLDIRECT 71 05/16/2023   TRIG 121 05/16/2023   CHOLHDL 2.5 05/16/2023    HbA1c  Lab Results  Component Value Date   HGBA1C 5.6 06/25/2023    Radiology    DG Chest Port 1 View Result Date: 08/24/2023 CLINICAL DATA:  CHF.  Pneumonia.  COPD. EXAM:  PORTABLE CHEST 1 VIEW COMPARISON:  Chest radiograph dated 08/23/2023. FINDINGS: No interval change in bilateral, left greater than right, pulmonary opacities. Small bilateral pleural effusions again noted. No pneumothorax. Stable cardiac silhouette. No acute osseous pathology. IMPRESSION: 1. No interval change in bilateral, left greater than right, pulmonary opacities. 2. Small bilateral pleural effusions. Electronically Signed   By: Elgie Collard M.D.   On: 08/24/2023 09:33   DG Chest 1 View Result Date: 08/23/2023 CLINICAL DATA:  78 year old female with shortness of breath. Emphysema, pneumonia. EXAM: CHEST  1 VIEW COMPARISON:  Portable chest 08/21/2023 and earlier. FINDINGS: Portable AP upright view at 0409 hours. Stable large lung volumes. Normal cardiac size and mediastinal contours. Visualized tracheal air column is within normal limits. Coarse and nodular left greater than right upper lobe predominant airspace opacity has not significantly changed. No superimposed pneumothorax. No overt consolidation at this time. Small pleural effusions demonstrated by CT in January may persist. Stable visualized osseous structures.  Paucity of bowel gas. IMPRESSION: Emphysema (NFA21-H08.9) with left greater than right lung Pneumonia not significantly changed from 08/21/2023. Small pleural effusions suspected. Electronically Signed   By: Odessa Fleming M.D.   On: 08/23/2023 04:47     Telemetry    PAF overnight up to 150's  Currently NSR 70s - Personally Reviewed  Cardiac Studies   Echo 06/2023 1. Left ventricular ejection fraction, by estimation, is 55 to 60%. The  left ventricle has normal function. The left ventricle has no regional  wall motion abnormalities. Left ventricular diastolic parameters are  consistent with Grade II diastolic  dysfunction (pseudonormalization).   2. Right ventricular systolic function is normal. The right ventricular  size is normal. There is moderately elevated pulmonary artery  systolic  pressure.   3. The mitral valve is degenerative. Moderate to severe mitral valve  regurgitation. No evidence of mitral stenosis.   4. Tricuspid valve regurgitation is mild to moderate.   5. The aortic valve is tricuspid. There is mild thickening of the aortic  valve.  Aortic valve regurgitation is trivial. Aortic valve  sclerosis/calcification is present, without any evidence of aortic  stenosis.   6. The inferior vena cava is normal in size with <50% respiratory  variability, suggesting right atrial pressure of 8 mmHg.     Echo 01/2023 1. Left ventricular ejection fraction, by estimation, is 55 to 60%. The  left ventricle has normal function. The left ventricle has no regional  wall motion abnormalities. Left ventricular diastolic parameters are  indeterminate. The average left  ventricular global longitudinal strain is -19.0 %.   2. Right ventricular systolic function is normal. The right ventricular  size is normal. There is normal pulmonary artery systolic pressure. The  estimated right ventricular systolic pressure is 28.8 mmHg.   3. The mitral valve is normal in structure. Moderate to severe mitral  valve regurgitation. No evidence of mitral stenosis. There is mild late  systolic prolapse of multiple segments of the anterior leaflet of the  mitral valve.   4. Tricuspid valve regurgitation is mild to moderate.   5. The aortic valve is normal in structure. Aortic valve regurgitation is  not visualized. No aortic stenosis is present.   6. The inferior vena cava is normal in size with greater than 50%  respiratory variability, suggesting right atrial pressure of 3 mmHg.    Cardiac CTA 2023 IMPRESSION: 1. Coronary calcium score of 95.3. This was 59th percentile for age and sex matched control. 2. Normal coronary origin with right dominance. 3. Mild proximal LCx stenosis (25%). 4. Minimal proximal RCA stenosis (<25%). 5. CAD-RADS 2. Mild non-obstructive CAD (25-49%).  Consider non-atherosclerotic causes of chest pain. Consider preventive therapy and risk factor modification. 6. Image quality degraded by motion artifacts.    Patient Profile     78 y.o. female with a hx of nonobstructive CAD, HFimpEF, mitral valve regurgitation, Paroxysmal Aflutter/fib, HTN, HLD, COPD, tobacco use who is being seen 08/23/2023 for the evaluation of paroxysmal Afib in the setting of acute on chronic resp failure at the request of Dr. Lyn Hollingshead.   Assessment & Plan    1. Paroxysmal Rapid Atrial Fibrillation H/o PAF on amio and eliquis in outpt setting. She was admitted to the hospital with severe sepsis, multifocal PNA, and COPD, and has been having PAF up to the 160's. Placed on IV amiodarone on 3/19 and had recurrent Afib to the 150's overnight last night/this AM.  She remained asymptomatic at that time - currently in sinus. Continue IV amiodarone given ongoing resp failure and risk of recurrent Afib.  Will look to transition to PO once resp status improved some more. Continue Eliquis 5mg  BID. Continue ? blocker.   2. Severe Sepsis/PNA Taylor Chambers presented with SOB requiring BiPAP. CXR showed PNA and small pleural effusions. Seems to have improved since initially being admitted but still not at baseline. Patient is now on 2L which is baseline for her. She did require BiPAP after working with PT. ABX per IM.  3. Elevated Troponin/Demand Ischemia Troponin on admission on 24  51. Probably caused by supply demand mismatch in the setting of sepsis, PNA, COPD exacerbation, and AFIB. No further ischemic workup required.   4. 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 Minus 1.3 l yesterday.  Does not appear to be markedly volume overloaded on exam.  Creatinine remains stable.  Bicarb rising.  Cont IV diuresis today.  Continue to monitor weights.   5. Moderate MR TEE in  November of 2024 showed moderate MR. Continue to monitor  outpatient.   Signed, Nicolasa Ducking, NP  08/25/2023, 11:22 AM    For questions or updates, please contact   Please consult [email or web address] for contact info under Cardiology/STEMI.

## 2023-08-25 NOTE — TOC Initial Note (Addendum)
 Transition of Care [facility]) - Initial/Assessment Note    Patient Details  Name: Taylor Chambers MRN: [id number] Date of Birth: 1945/08/30  Transition of [facility]) CM/SW Contact:    Liliana Cline, LCSW Phone Number: 08/25/2023, 2:03 PM  Clinical Narrative:                 Patient is from home with spouse. PCP is Dr. Sampson Goon. Patient is active with [facility] for PT and RN services - Elnita Maxwell with [facility] confirmed.  4:10- Notified by DO that patient requested a bipap for home. DO also notified Pulmonology. Called Mitch with [employer] - he is checking if patient qualifies.  Expected Discharge Plan: Home w Home Health Services Barriers to Discharge: Continued Medical Work up   Patient Goals and CMS Choice            Expected Discharge Plan and Services       Living arrangements for the past 2 months: Single Family Home                                      Prior Living Arrangements/Services Living arrangements for the past 2 months: Single Family Home Lives with:: Spouse              Current home services: DME, Home PT, Home RN    Activities of Daily Living   ADL Screening (condition at time of admission) Independently performs ADLs?: Yes (appropriate for developmental age) Is the patient deaf or have difficulty hearing?: No Does the patient have difficulty seeing, even when wearing glasses/contacts?: No Does the patient have difficulty concentrating, remembering, or making decisions?: No  Permission Sought/Granted                  Emotional Assessment              Admission diagnosis:  COPD exacerbation (HCC) [J44.1] Severe sepsis with acute organ dysfunction (HCC) [A41.9, R65.20] Multifocal pneumonia [J18.9] Patient Active Problem List   Diagnosis Date Noted   Acute on chronic diastolic CHF (congestive heart failure) (HCC) 08/24/2023   Multifocal pneumonia 08/23/2023   Severe sepsis with acute organ dysfunction (HCC)  08/21/2023   Hyperlipidemia 08/21/2023   Depression 08/21/2023   Acute on chronic respiratory failure with hypoxia and hypercapnia (HCC) 06/29/2023   COPD (chronic obstructive pulmonary disease) (HCC) 06/25/2023   Hyperglycemia 06/24/2023   Severe mitral valve regurgitation 04/21/2023   SOB (shortness of breath) 04/21/2023   Atrial fibrillation with RVR (HCC) 09/06/2022   Paroxysmal atrial fibrillation with RVR (HCC) 09/05/2022   Leukocytosis 09/05/2022   Macrocytosis 09/05/2022   Lung nodule 01/12/2022   Myocardial injury 01/12/2022   Protein-calorie malnutrition, severe (HCC) 01/12/2022   COPD exacerbation (HCC) 01/11/2022   Sepsis (HCC) 01/11/2022   Acute hypercapnic respiratory failure (HCC) 01/11/2022   Hx of total hip arthroplasty, 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 [place] [address] Phone: [phone] Fax: [phone]     Social Drivers of Health (SDOH) Social History: SDOH Screenings   Food Insecurity: No Food Insecurity (08/23/2023)  Housing: Low Risk  (  08/23/2023)  Transportation Needs: No Transportation Needs (08/23/2023)  Utilities: Not At Risk (08/23/2023)  Financial Resource Strain: Low Risk  (07/06/2023)   Received from [facility]  Social Connections: Moderately Isolated (08/23/2023)  Tobacco Use: Medium Risk (08/21/2023)   SDOH Interventions:     Readmission Risk Interventions     No data to display

## 2023-08-26 DIAGNOSIS — I4891 Unspecified atrial fibrillation: Secondary | ICD-10-CM | POA: Diagnosis not present

## 2023-08-26 DIAGNOSIS — J9621 Acute and chronic respiratory failure with hypoxia: Secondary | ICD-10-CM | POA: Diagnosis not present

## 2023-08-26 DIAGNOSIS — J441 Chronic obstructive pulmonary disease with (acute) exacerbation: Secondary | ICD-10-CM | POA: Diagnosis not present

## 2023-08-26 DIAGNOSIS — A419 Sepsis, unspecified organism: Secondary | ICD-10-CM | POA: Diagnosis not present

## 2023-08-26 DIAGNOSIS — I5033 Acute on chronic diastolic (congestive) heart failure: Secondary | ICD-10-CM | POA: Diagnosis not present

## 2023-08-26 LAB — BASIC METABOLIC PANEL
Anion gap: 8 (ref 5–15)
BUN: 18 mg/dL (ref 8–23)
CO2: 35 mmol/L — ABNORMAL HIGH (ref 22–32)
Calcium: 8.2 mg/dL — ABNORMAL LOW (ref 8.9–10.3)
Chloride: 99 mmol/L (ref 98–111)
Creatinine, Ser: 0.63 mg/dL (ref 0.44–1.00)
GFR, Estimated: >60 mL/min (ref >60)
Glucose, Bld: 98 mg/dL (ref 70–99)
Potassium: 3.9 mmol/L (ref 3.5–5.1)
Sodium: 142 mmol/L (ref 135–145)

## 2023-08-26 LAB — RENAL FUNCTION PANEL
Albumin: 2.4 g/dL — ABNORMAL LOW (ref 3.5–5.0)
Anion gap: 10 (ref 5–15)
BUN: 18 mg/dL (ref 8–23)
CO2: 35 mmol/L — ABNORMAL HIGH (ref 22–32)
Calcium: 8.2 mg/dL — ABNORMAL LOW (ref 8.9–10.3)
Chloride: 99 mmol/L (ref 98–111)
Creatinine, Ser: 0.59 mg/dL (ref 0.44–1.00)
GFR, Estimated: >60 mL/min (ref >60)
Glucose, Bld: 99 mg/dL (ref 70–99)
Phosphorus: 3.2 mg/dL (ref 2.5–4.6)
Potassium: 3.9 mmol/L (ref 3.5–5.1)
Sodium: 144 mmol/L (ref 135–145)

## 2023-08-26 LAB — GLUCOSE, CAPILLARY
Glucose-Capillary: 91 mg/dL (ref 70–99)
Glucose-Capillary: 142 mg/dL — ABNORMAL HIGH (ref 70–99)
Glucose-Capillary: 178 mg/dL — ABNORMAL HIGH (ref 70–99)
Glucose-Capillary: 175 mg/dL — ABNORMAL HIGH (ref 70–99)

## 2023-08-26 LAB — CULTURE, BLOOD (ROUTINE X 2)
Culture: NO GROWTH
Culture: NO GROWTH

## 2023-08-26 MED ORDER — AMIODARONE HCL 200 MG PO TABS: 200.0000 mg | ORAL_TABLET | Freq: Every day | ORAL | Status: DC

## 2023-08-26 MED ORDER — AMIODARONE HCL 200 MG PO TABS: 200.0000 mg | ORAL_TABLET | Freq: Two times a day (BID) | ORAL | Status: DC

## 2023-08-26 MED ORDER — BUDESONIDE 0.25 MG/2ML IN SUSP
0.2500 mg | Freq: Two times a day (BID) | RESPIRATORY_TRACT | Status: DC
  Administered 2023-08-26 – 2023-08-29 (×6): 0.25 mg via RESPIRATORY_TRACT
  Filled 2023-08-26 (×6): qty 2

## 2023-08-26 MED ORDER — AMIODARONE HCL 200 MG PO TABS
400.0000 mg | ORAL_TABLET | Freq: Two times a day (BID) | ORAL | Status: DC
  Administered 2023-08-26 – 2023-08-29 (×7): 400 mg via ORAL
  Filled 2023-08-26 (×7): qty 2

## 2023-08-26 MED ORDER — AMIODARONE HCL 200 MG PO TABS: 400.0000 mg | ORAL_TABLET | Freq: Two times a day (BID) | ORAL | Status: DC

## 2023-08-26 NOTE — Progress Notes (Signed)
   Patient Name: Taylor Chambers Date of Encounter: 08/26/2023 [facility] Cardiologist: Lorine Bears, MD   Interval Summary  .    I/Os may not be accurate. Kidney function stable. Patient reports breathing is better. On 2L O2. She denies chest pain.   Vital Signs .    Vitals:   08/25/23 1828 08/25/23 2000 08/26/23 0000 08/26/23 0400  BP:  133/74 134/66 129/78  Pulse:  83 71 73  Resp:  18 10 20   Temp: 98.3 F (36.8 C) 98.3 F (36.8 C) 98.2 F (36.8 C) 98 F (36.7 C)  TempSrc: Oral Oral Oral Oral  SpO2:  97% 100% 99%  Weight:      Height:        Intake/Output Summary (Last 24 hours) at 08/26/2023 0722 Last data filed at 08/25/2023 2250 Gross per 24 hour  Intake 470 ml  Output 900 ml  Net -430 ml      08/21/2023    9:00 AM 08/18/2023    2:28 PM 06/29/2023    7:42 AM  Last 3 Weights  Weight (lbs) 100 lb 15.5 oz 101 lb 110 lb 3.7 oz  Weight (kg) 45.8 kg 45.813 kg 50 kg      Telemetry/ECG    NSR HR 70s - Personally Reviewed  Physical Exam .   GEN: No acute distress.   Neck: No JVD Cardiac: RRR, no murmurs, rubs, or gallops.  Respiratory: Clear to auscultation bilaterally. GI: Soft, nontender, non-distended  MS: No edema  Assessment & Plan .    Paroxysmal Afib with RVR - H/o Afib on Eliquis in the outpatient setting - admitted with severe sepsis, multifocal PNA, and COPD and Rapid Afib started on IV amiodarone - converted 3/19 to NSR. She remains in NSR - continue IV amiodarone given ongoing respiratory failure and risk of recurrent Afib - continue Eliquis 5mg  BID - continue Toprol 25mg  BID  Severe Sepsis PNA - initially requiring Bipap - CXR showed PNA and small pleural effusions - she is now on 2L O2 - abx per IM  Elevated troponin - HS trop 24>51, suspect supply demand mismatch in the setting of sepsis, PNA< COPD and rapid Afib - no further ischemic work-up  Acute on chronic HFimEF - Echo 06/2023 showed LVEF 55-60%, no WMA, G2DD, mod  to severe MR, mild to mod TR - BNP 1377 - IV lasix 40mg  BID - suspect I/Os not accurate - renal function stable - continue IV diuresis  Mod MR - TEE in November 2024 showed moderate MR - continue to monitor as OP  For questions or updates, please contact [facility] Please consult [email or web address] for contact info under        Signed, Verlie Liotta David Stall, PA-C

## 2023-08-26 NOTE — Plan of Care (Signed)

## 2023-08-26 NOTE — Progress Notes (Signed)
 PROGRESS NOTE    Taylor Chambers   NWG:[id number] DOB: Jul 05, 1945  DOA: 08/21/2023 Date of Service: 08/26/23 which is hospital day 5  PCP: Mick Sell, MD    Hospital course / significant events:   HPI: 78 year old female with history of COPD, hyperlipidemia, heart failure preserved ejection fraction, hypertension, depression, who presents emergency department for chief concerns of respiratory distress via EMS. Per ED nursing documentation, patient was on CPAP on arrival and has labored breathing.   Of note, patient was admitted 1/18 - 06/28/2023 for AECOPD, A-fib with RVR, and CHF exacerbation and returned the next day 1/23 was discharged on 1/27 for acute on chronic respiratory failure with hypoxia and hypercapnia.   03/17: to ED, admitted to hospitalist service. BiPap --. 4L [place]. Chest x-ray favored multifocal pneumonia, doxy initiated, given 1L bolus. Started steroids. Afib RVR, started  03/18: Palliative care consult. Continued IV fluids.  03/19: Overnight AfibRVR, started amiodarone gtt. HR remained elevated, Cardiology consult. 4L O2 --> back on BiPap, lasix. Converted to NSR later this afternoon.  03/20: good UOP and off BiPap, still needing 6L O2 per [place]. Continue diuresing. HR has been controlled  03/21: walked w/ PT and needed BiPap but then back on [place]> Reports BiPap helps w/ SOB. PAF overnight up to 150's --> Currently NSR 70s. Home BiPAP ordered and in process  03/22: TOC/HH working on home BiPAP. Cardio managing transition to po diuretics and rate control      Consultants:  Palliative care Cardiology   Procedures/Surgeries: none      ASSESSMENT & PLAN:   Severe sepsis with acute organ dysfunction  Question solely sepsis d/t PNA +/- SIRS component d/t CHF/COPD Treat underlying cause(s) as below  CAP w/ elevation procalcitonin  ceftriaxone 2 g IV daily to complete a 5-day course As patient has recently been prescribed azithromycin, doxycycline 100 mg  IV twice daily, to complete a 5-day course Blood cultures x 2 are neg thus far   COPD exacerbation d/t CAP continue steroid burst w/ prednisone Supplemental O2  Scheduled and PRN DuoNebs  HFpEF acute on chronic  Elevated BNP but this appears chronic Recent Echo, EF 55-60, G2DD, mod-severe MR - will not repeat Echo  Lasix IV to continue today likely to po tomorrow / per cardiology  Strict I&O  Continue toprol, losartan  Cardiology following  Acute on chronic hypoxic respiratory failure  D/t CHF, COPD, CAP BiPap dependent on occasion Informal discussion w/ pulmonologist Dr Karna Christmas, will follow outpatient, also recs for order home BIPAP which is in process   A-fib Rapid, paroxysmal  Eliquis 5 mg p.o. twice daily Amiodarone drip --> po amiodarone today  Metroptolol succinate 25 mg bid po D/c ASA  Demand ischemia Elevated troponin on admission No further workup per cardiology  Moderate MR TEE in November of 2024 showed moderate MR.  Continue to monitor outpatient.  Hypokalemia Replace as needed Monitor BMP  Depression Home sertraline 100 mg daily resumed   Hyperlipidemia Home pravastatin 10 mg daily resumed   Leukocytosis Likely infection + steroids  Treat per above   Protein-calorie malnutrition, severe Suspect moderate to severe, query severe COPD cachexia   Benign essential hypertension Meds as above    Goals of care Palliative care consult      underweight based on BMI: Body mass index is 18.47 kg/m.  Underweight - under 18  overweight - 25 to 29 obese - 30 or more Class 1 obesity: BMI of 30.0 to 34 Class 2 obesity:  BMI of 35.0 to 39 Class 3 obesity: BMI of 40.0 to 49 Super Morbid Obesity: BMI 50-59 Super-super Morbid Obesity: BMI 60+ Significantly low or high BMI is associated with higher medical risk.  Weight management advised as adjunct to other disease management and risk reduction treatments    DVT prophylaxis: eliquis IV fluids:  none Nutrition: cardiac diet  Central lines / other devices: none  Code Status: DNR ACP documentation reviewed: none on file in [facility]  [facility] needs: home health PT/OT Medical barriers to dispo: O2 requirement, diuresing, cardiology to see. Expected medical readiness for discharge next 1-2 days if stable on po meds              Subjective / Brief ROS:  Patient reports SOB is much better today Denies CP, no palpitations Pain controlled.  Denies new weakness.  Tolerating diet.  Reports no concerns w/ urination/defecation.   Family Communication: none at this time     Objective Findings:  Vitals:   08/26/23 0400 08/26/23 0755 08/26/23 0800 08/26/23 1200  BP: 129/78  130/81 131/74  Pulse: 73 80 73 80  Resp: 20 (!) 25 20 (!) 27  Temp: 98 F (36.7 C)  98 F (36.7 C) 98.1 F (36.7 C)  TempSrc: Oral  Oral Oral  SpO2: 99% 98% 100% 96%  Weight:      Height:        Intake/Output Summary (Last 24 hours) at 08/26/2023 1551 Last data filed at 08/26/2023 1414 Gross per 24 hour  Intake 710 ml  Output 1500 ml  Net -790 ml   Filed Weights   08/21/23 0900  Weight: 45.8 kg    Examination:  Physical Exam Constitutional:      General: She is not in acute distress.    Appearance: She is not ill-appearing.  Cardiovascular:     Rate and Rhythm: Normal rate and regular rhythm.  Pulmonary:     Effort: No respiratory distress.     Breath sounds: Normal breath sounds.  Abdominal:     Palpations: Abdomen is soft.  Musculoskeletal:     Right lower leg: No edema.     Left lower leg: No edema.  Neurological:     Mental Status: She is alert and oriented to person, place, and time. Mental status is at baseline.  Psychiatric:        Mood and Affect: Mood normal.        Behavior: Behavior normal.          Scheduled Medications:   amiodarone  400 mg Oral BID   Followed by   Melene Muller ON 09/02/2023] amiodarone  200 mg Oral BID   Followed by   Melene Muller ON 09/09/2023]  amiodarone  200 mg Oral Daily   apixaban  5 mg Oral BID   budesonide (PULMICORT) nebulizer solution  0.25 mg Nebulization BID   cholecalciferol  5,000 Units Oral Q1500   furosemide  40 mg Intravenous BID   insulin aspart  0-9 Units Subcutaneous TID WC   lactose free nutrition  237 mL Oral TID WC   levalbuterol  0.63 mg Nebulization TID   losartan  100 mg Oral Daily   metoprolol succinate  25 mg Oral BID   multivitamin with minerals  1 tablet Oral Daily   potassium chloride SA  40 mEq Oral BID   pravastatin  10 mg Oral q1800   sertraline  100 mg Oral Daily    Continuous Infusions:    PRN Medications:  haloperidol  lactate, LORazepam, pantoprazole, senna-docusate  Antimicrobials from admission:  Anti-infectives (From admission, onward)    Start     Dose/Rate Route Frequency Ordered Stop   08/22/23 1000  cefTRIAXone (ROCEPHIN) 2 g in sodium chloride 0.9 % 100 mL IVPB        2 g 200 mL/hr over 30 Minutes Intravenous Every 24 hours 08/21/23 1254 08/25/23 0941   08/21/23 2100  doxycycline (VIBRAMYCIN) 100 mg in sodium chloride 0.9 % 250 mL IVPB        100 mg 125 mL/hr over 120 Minutes Intravenous Every 12 hours 08/21/23 1258 08/26/23 1110   08/21/23 1100  cefTRIAXone (ROCEPHIN) 2 g in sodium chloride 0.9 % 100 mL IVPB  Status:  Discontinued        2 g 200 mL/hr over 30 Minutes Intravenous Once 08/21/23 1049 08/21/23 1049   08/21/23 1100  azithromycin (ZITHROMAX) 500 mg in sodium chloride 0.9 % 250 mL IVPB  Status:  Discontinued        500 mg 250 mL/hr over 60 Minutes Intravenous  Once 08/21/23 1049 08/21/23 1049   08/21/23 0930  doxycycline (VIBRAMYCIN) 100 mg in sodium chloride 0.9 % 250 mL IVPB        100 mg 125 mL/hr over 120 Minutes Intravenous  Once 08/21/23 0857 08/21/23 1129   08/21/23 0900  cefTRIAXone (ROCEPHIN) 1 g in sodium chloride 0.9 % 100 mL IVPB        1 g 200 mL/hr over 30 Minutes Intravenous  Once 08/21/23 0857 08/21/23 1610           Data Reviewed:  I  have personally reviewed the following...  CBC: Recent Labs  Lab 08/21/23 0908 08/22/23 0717 08/23/23 0245 08/24/23 0456  WBC 29.4* 24.9* 26.7* 25.8*  NEUTROABS 22.5*  --   --   --   HGB 13.3 10.7* 11.7* 12.0  HCT 41.8 33.9* 35.6* 36.3  MCV 107.5* 106.9* 102.9* 101.7*  PLT 630* 421* 467* 391   Basic Metabolic Panel: Recent Labs  Lab 08/22/23 0717 08/23/23 0245 08/24/23 0456 08/25/23 0523 08/26/23 0504  NA 142 138 142 143 144  142  K 3.6 3.8 3.0* 3.7 3.9  3.9  CL 112* 110 105 103 99  99  CO2 24 20* 26 32 35*  35*  GLUCOSE 87 116* 92 108* 99  98  BUN 21 17 14 19 18  18   CREATININE 0.64 0.55 0.39* 0.57 0.59  0.63  CALCIUM 7.8* 8.0* 7.9* 8.1* 8.2*  8.2*  MG  --  2.1  --  1.9  --   PHOS  --  2.0* 1.7* 1.0* 3.2   GFR: Estimated Creatinine Clearance: 43.3 mL/min (by C-G formula based on SCr of 0.59 mg/dL). Liver Function Tests: Recent Labs  Lab 08/21/23 0908 08/26/23 0504  AST 38  --   ALT 49*  --   ALKPHOS 75  --   BILITOT 0.7  --   PROT 6.3*  --   ALBUMIN 3.0* 2.4*   No results for input(s): "LIPASE", "AMYLASE" in the last 168 hours. No results for input(s): "AMMONIA" in the last 168 hours. Coagulation Profile: Recent Labs  Lab 08/21/23 0908 08/22/23 0717  INR 1.2 1.5*   Cardiac Enzymes: No results for input(s): "CKTOTAL", "CKMB", "CKMBINDEX", "TROPONINI" in the last 168 hours. BNP (last 3 results) No results for input(s): "PROBNP" in the last 8760 hours. HbA1C: No results for input(s): "HGBA1C" in the last 72 hours. CBG: Recent Labs  Lab 08/25/23 1204  08/25/23 1617 08/25/23 2043 08/26/23 0744 08/26/23 1157  GLUCAP 126* 202* 219* 91 142*   Lipid Profile: No results for input(s): "CHOL", "HDL", "LDLCALC", "TRIG", "CHOLHDL", "LDLDIRECT" in the last 72 hours. Thyroid Function Tests: No results for input(s): "TSH", "T4TOTAL", "FREET4", "T3FREE", "THYROIDAB" in the last 72 hours. Anemia Panel: No results for input(s): "VITAMINB12",  "FOLATE", "FERRITIN", "TIBC", "IRON", "RETICCTPCT" in the last 72 hours. Most Recent Urinalysis On File:     Component Value Date/Time   COLORURINE YELLOW (A) 06/29/2023 1448   APPEARANCEUR CLOUDY (A) 06/29/2023 1448   LABSPEC 1.029 06/29/2023 1448   PHURINE 5.0 06/29/2023 1448   GLUCOSEU NEGATIVE 06/29/2023 1448   HGBUR NEGATIVE 06/29/2023 1448   BILIRUBINUR NEGATIVE 06/29/2023 1448   KETONESUR NEGATIVE 06/29/2023 1448   PROTEINUR >=300 (A) 06/29/2023 1448   NITRITE NEGATIVE 06/29/2023 1448   LEUKOCYTESUR NEGATIVE 06/29/2023 1448   Sepsis Labs: @LABRCNTIP (procalcitonin:4,lacticidven:4) Microbiology: Recent Results (from the past 240 hours)  Resp panel by RT-PCR (RSV, Flu A&B, Covid) Anterior Nasal Swab     Status: None   Collection Time: 08/21/23  9:08 AM   Specimen: Anterior Nasal Swab  Result Value Ref Range Status   SARS Coronavirus 2 by RT PCR NEGATIVE NEGATIVE Final    Comment: (NOTE) SARS-CoV-2 target nucleic acids are NOT DETECTED.  The SARS-CoV-2 RNA is generally detectable in upper respiratory specimens during the acute phase of infection. The lowest concentration of SARS-CoV-2 viral copies this assay can detect is 138 copies/mL. A negative result does not preclude SARS-Cov-2 infection and should not be used as the sole basis for treatment or other patient management decisions. A negative result may occur with  improper specimen collection/handling, submission of specimen other than nasopharyngeal swab, presence of viral mutation(s) within the areas targeted by this assay, and inadequate number of viral copies(<138 copies/mL). A negative result must be combined with clinical observations, patient history, and epidemiological information. The expected result is Negative.  Fact Sheet for Patients:  BloggerCourse.com  Fact Sheet for Healthcare Providers:  SeriousBroker.it  This test is no t yet approved or cleared  by the [place] FDA and  has been authorized for detection and/or diagnosis of SARS-CoV-2 by FDA under an Emergency Use Authorization (EUA). This EUA will remain  in effect (meaning this test can be used) for the duration of the COVID-19 declaration under Section 564(b)(1) of the Act, 21 U.S.C.section 360bbb-3(b)(1), unless the authorization is terminated  or revoked sooner.       Influenza A by PCR NEGATIVE NEGATIVE Final   Influenza B by PCR NEGATIVE NEGATIVE Final    Comment: (NOTE) The Xpert Xpress SARS-CoV-2/FLU/RSV plus assay is intended as an aid in the diagnosis of influenza from Nasopharyngeal swab specimens and should not be used as a sole basis for treatment. Nasal washings and aspirates are unacceptable for Xpert Xpress SARS-CoV-2/FLU/RSV testing.  Fact Sheet for Patients: BloggerCourse.com  Fact Sheet for Healthcare Providers: SeriousBroker.it  This test is not yet approved or cleared by the [place] FDA and has been authorized for detection and/or diagnosis of SARS-CoV-2 by FDA under an Emergency Use Authorization (EUA). This EUA will remain in effect (meaning this test can be used) for the duration of the COVID-19 declaration under Section 564(b)(1) of the Act, 21 U.S.C. section 360bbb-3(b)(1), unless the authorization is terminated or revoked.     Resp Syncytial Virus by PCR NEGATIVE NEGATIVE Final    Comment: (NOTE) Fact Sheet for Patients: BloggerCourse.com  Fact Sheet for Healthcare Providers: SeriousBroker.it  This test is not yet approved or cleared by the [place] and has been authorized for detection and/or diagnosis of SARS-CoV-2 by FDA under an Emergency Use Authorization (EUA). This EUA will remain in effect (meaning this test can be used) for the duration of the COVID-19 declaration under Section 564(b)(1) of the Act, 21  U.S.C. section 360bbb-3(b)(1), unless the authorization is terminated or revoked.  Performed at [facility], [address] [place], [place] [address]   Culture, blood (routine x 2)     Status: None   Collection Time: 08/21/23 11:36 AM   Specimen: BLOOD RIGHT ARM  Result Value Ref Range Status   Specimen Description BLOOD RIGHT ARM  Final   Special Requests   Final    BOTTLES DRAWN AEROBIC AND ANAEROBIC Blood Culture results may not be optimal due to an inadequate volume of blood received in culture bottles   Culture   Final    NO GROWTH 5 DAYS Performed at [facility], [address] [place], [place] [address]    Report Status 08/26/2023 FINAL  Final  Culture, blood (routine x 2)     Status: None   Collection Time: 08/21/23 11:36 AM   Specimen: BLOOD LEFT ARM  Result Value Ref Range Status   Specimen Description BLOOD LEFT ARM  Final   Special Requests   Final    BOTTLES DRAWN AEROBIC AND ANAEROBIC Blood Culture results may not be optimal due to an inadequate volume of blood received in culture bottles   Culture   Final    NO GROWTH 5 DAYS Performed at [facility], [address] [place], [place] [address]    Report Status 08/26/2023 FINAL  Final  Respiratory (~20 pathogens) panel by PCR     Status: None   Collection Time: 08/23/23  4:54 AM   Specimen: Nasopharyngeal Swab; Respiratory  Result Value Ref Range Status   Adenovirus NOT DETECTED NOT DETECTED Final   Coronavirus 229E NOT DETECTED NOT DETECTED Final    Comment: (NOTE) The Coronavirus on the Respiratory Panel, DOES NOT test for the novel  Coronavirus (2019 nCoV)    Coronavirus HKU1 NOT DETECTED NOT DETECTED Final   Coronavirus NL63 NOT DETECTED NOT DETECTED Final   Coronavirus OC43 NOT DETECTED NOT DETECTED Final   Metapneumovirus NOT DETECTED NOT DETECTED Final   Rhinovirus / Enterovirus NOT DETECTED NOT DETECTED Final   Influenza A NOT DETECTED NOT DETECTED Final    Influenza B NOT DETECTED NOT DETECTED Final   Parainfluenza Virus 1 NOT DETECTED NOT DETECTED Final   Parainfluenza Virus 2 NOT DETECTED NOT DETECTED Final   Parainfluenza Virus 3 NOT DETECTED NOT DETECTED Final   Parainfluenza Virus 4 NOT DETECTED NOT DETECTED Final   Respiratory Syncytial Virus NOT DETECTED NOT DETECTED Final   Bordetella pertussis NOT DETECTED NOT DETECTED Final   Bordetella Parapertussis NOT DETECTED NOT DETECTED Final   Chlamydophila pneumoniae NOT DETECTED NOT DETECTED Final   Mycoplasma pneumoniae NOT DETECTED NOT DETECTED Final    Comment: Performed at [facility], [address], [place], [place] [address]      Radiology Studies last 3 days: [facility] Chest [facility] 1 View Result Date: 08/24/2023 CLINICAL DATA:  CHF.  Pneumonia.  COPD. EXAM: PORTABLE CHEST 1 VIEW COMPARISON:  Chest radiograph dated 08/23/2023. FINDINGS: No interval change in bilateral, left greater than right, pulmonary opacities. Small bilateral pleural effusions again noted. No pneumothorax. Stable cardiac silhouette. No acute osseous pathology. IMPRESSION:  1. No interval change in bilateral, left greater than right, pulmonary opacities. 2. Small bilateral pleural effusions. Electronically Signed   By: Elgie Collard M.D.   On: 08/24/2023 09:33   DG Chest 1 View Result Date: 08/23/2023 CLINICAL DATA:  78 year old female with shortness of breath. Emphysema, pneumonia. EXAM: CHEST  1 VIEW COMPARISON:  Portable chest 08/21/2023 and earlier. FINDINGS: Portable AP upright view at 0409 hours. Stable large lung volumes. Normal cardiac size and mediastinal contours. Visualized tracheal air column is within normal limits. Coarse and nodular left greater than right upper lobe predominant airspace opacity has not significantly changed. No superimposed pneumothorax. No overt consolidation at this time. Small pleural effusions demonstrated by CT in January may persist. Stable visualized osseous structures.  Paucity of  bowel gas. IMPRESSION: Emphysema ([id number]) with left greater than right lung Pneumonia not significantly changed from 08/21/2023. Small pleural effusions suspected. Electronically Signed   By: Odessa Fleming M.D.   On: 08/23/2023 04:47        Sunnie Nielsen, DO [facility] 08/26/2023, 3:51 PM    Dictation software may have been used to generate the above note. Typos may occur and escape review in typed/dictated notes. Please contact Dr Lyn Hollingshead directly for clarity if needed.  Staff may message me via secure chat in Epic  but this may not receive an immediate response,  please page me for urgent matters!  If 7PM-7AM, please contact night coverage [email or web address]

## 2023-08-27 DIAGNOSIS — A419 Sepsis, unspecified organism: Secondary | ICD-10-CM | POA: Diagnosis not present

## 2023-08-27 DIAGNOSIS — I5033 Acute on chronic diastolic (congestive) heart failure: Secondary | ICD-10-CM | POA: Diagnosis not present

## 2023-08-27 DIAGNOSIS — J441 Chronic obstructive pulmonary disease with (acute) exacerbation: Secondary | ICD-10-CM | POA: Diagnosis not present

## 2023-08-27 DIAGNOSIS — J9621 Acute and chronic respiratory failure with hypoxia: Secondary | ICD-10-CM | POA: Diagnosis not present

## 2023-08-27 DIAGNOSIS — I4891 Unspecified atrial fibrillation: Secondary | ICD-10-CM | POA: Diagnosis not present

## 2023-08-27 LAB — BASIC METABOLIC PANEL
Anion gap: 9 (ref 5–15)
BUN: 21 mg/dL (ref 8–23)
CO2: 36 mmol/L — ABNORMAL HIGH (ref 22–32)
Calcium: 8.6 mg/dL — ABNORMAL LOW (ref 8.9–10.3)
Chloride: 97 mmol/L — ABNORMAL LOW (ref 98–111)
Creatinine, Ser: 0.63 mg/dL (ref 0.44–1.00)
GFR, Estimated: >60 mL/min (ref >60)
Glucose, Bld: 92 mg/dL (ref 70–99)
Potassium: 4.2 mmol/L (ref 3.5–5.1)
Sodium: 142 mmol/L (ref 135–145)

## 2023-08-27 LAB — GLUCOSE, CAPILLARY
Glucose-Capillary: 105 mg/dL — ABNORMAL HIGH (ref 70–99)
Glucose-Capillary: 141 mg/dL — ABNORMAL HIGH (ref 70–99)
Glucose-Capillary: 120 mg/dL — ABNORMAL HIGH (ref 70–99)
Glucose-Capillary: 152 mg/dL — ABNORMAL HIGH (ref 70–99)

## 2023-08-27 LAB — CBC
HCT: 35.8 % — ABNORMAL LOW (ref 36.0–46.0)
Hemoglobin: 11.8 g/dL — ABNORMAL LOW (ref 12.0–15.0)
MCH: 33.8 pg (ref 26.0–34.0)
MCHC: 33 g/dL (ref 30.0–36.0)
MCV: 102.6 fL — ABNORMAL HIGH (ref 80.0–100.0)
Platelets: 346 10*3/uL (ref 150–400)
RBC: 3.49 MIL/uL — ABNORMAL LOW (ref 3.87–5.11)
RDW: 14.2 % (ref 11.5–15.5)
WBC: 23.8 10*3/uL — ABNORMAL HIGH (ref 4.0–10.5)
nRBC: 0 % (ref 0.0–0.2)

## 2023-08-27 MED ORDER — FUROSEMIDE 40 MG PO TABS
40.0000 mg | ORAL_TABLET | Freq: Every day | ORAL | Status: DC
  Administered 2023-08-28 – 2023-08-29 (×2): 40 mg via ORAL
  Filled 2023-08-27 (×2): qty 1

## 2023-08-27 MED ORDER — DUPILUMAB 300 MG/2ML ~~LOC~~ SOAJ
300.0000 mg | Freq: Once | SUBCUTANEOUS | Status: AC
  Administered 2023-08-27: 300 mg via SUBCUTANEOUS
  Filled 2023-08-27: qty 2

## 2023-08-27 MED ORDER — NON FORMULARY
300.0000 mg | Status: DC
Start: 2023-08-27 — End: 2023-08-27

## 2023-08-27 NOTE — Progress Notes (Signed)
   Patient Name: Taylor Chambers Date of Encounter: 08/27/2023 [facility] Cardiologist: Lorine Bears, MD   Interval Summary  .    UOP - . Patient overall is feeling a lot better. She is on 2L O2. She denies chest pain. Maintaining NSR. Kidney function stable. WBC still elevated.  Vital Signs .    Vitals:   08/26/23 2028 08/26/23 2155 08/27/23 0000 08/27/23 0300  BP:   (!) 88/78 125/64  Pulse:   71   Resp:    (!) 24  Temp:   97.8 F (36.6 C)   TempSrc:   Oral Oral  SpO2: 96% 100% 99%   Weight:      Height:        Intake/Output Summary (Last 24 hours) at 08/27/2023 0724 Last data filed at 08/26/2023 1926 Gross per 24 hour  Intake 980 ml  Output 600 ml  Net 380 ml      08/21/2023    9:00 AM 08/18/2023    2:28 PM 06/29/2023    7:42 AM  Last 3 Weights  Weight (lbs) 100 lb 15.5 oz 101 lb 110 lb 3.7 oz  Weight (kg) 45.8 kg 45.813 kg 50 kg      Telemetry/ECG    NSR HR 60-70s - Personally Reviewed  Physical Exam .   GEN: No acute distress.   Neck: No JVD Cardiac: RRR, no murmurs, rubs, or gallops.  Respiratory: minimal wheezing. GI: Soft, nontender, non-distended  MS: No edema  Assessment & Plan .    Paroxysmal Afib with RVR - H/o Afib on Eliquis in the outpatient setting - admitted with severe sepsis, multifocal PNA, and COPD and Rapid Afib started on IV amiodarone - converted 3/19 to NSR. 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

## 2023-08-27 NOTE — Plan of Care (Signed)

## 2023-08-27 NOTE — Progress Notes (Signed)
 PROGRESS NOTE    Taylor Chambers   NWG:[id number] DOB: September 17, 1945  DOA: 08/21/2023 Date of Service: 08/27/23 which is hospital day 6  PCP: Mick Sell, MD    Hospital course / significant events:   HPI: 78 year old female with history of COPD, hyperlipidemia, heart failure preserved ejection fraction, hypertension, depression, who presents emergency department for chief concerns of respiratory distress via EMS. Per ED nursing documentation, patient was on CPAP on arrival and has labored breathing.   Of note, patient was admitted 1/18 - 06/28/2023 for AECOPD, A-fib with RVR, and CHF exacerbation and returned the next day 1/23 was discharged on 1/27 for acute on chronic respiratory failure with hypoxia and hypercapnia.   03/17: to ED, admitted to hospitalist service. BiPap --. 4L [place]. Chest x-ray favored multifocal pneumonia, doxy initiated, given 1L bolus. Started steroids. Afib RVR, started  03/18: Palliative care consult. Continued IV fluids.  03/19: Overnight AfibRVR, started amiodarone gtt. HR remained elevated, Cardiology consult. 4L O2 --> back on BiPap, lasix. Converted to NSR later this afternoon.  03/20: good UOP and off BiPap, still needing 6L O2 per [place]. Continue diuresing. HR has been controlled  03/21: walked w/ PT and needed BiPap but then back on [place]> Reports BiPap helps w/ SOB. PAF overnight up to 150's --> Currently NSR 70s. Home BiPAP ordered and in process  03/22: TOC/HH working on home BiPAP. Cardio managing transition to po diuretics and rate control  03/23: doing well on po amiodarone, transitioning to po lasix      Consultants:  Palliative care Cardiology   Procedures/Surgeries: none      ASSESSMENT & PLAN:   Severe sepsis with acute organ dysfunction  Question solely sepsis d/t PNA +/- SIRS component d/t CHF/COPD Treat underlying cause(s) as below  CAP w/ elevation procalcitonin  S/p 5 days abx  Blood cultures x 2 are neg   COPD  exacerbation d/t CAP finished steroid burst w/ prednisone Supplemental O2  Scheduled and PRN DuoNebs  HFpEF acute on chronic  Elevated BNP but this appears chronic Recent Echo, EF 55-60, G2DD, mod-severe MR - will not repeat Echo  Lasix IV to po per cardiology  Strict I&O  Continue toprol, losartan  Cardiology following  Acute on chronic hypoxic respiratory failure  D/t CHF, COPD, CAP BiPap dependent on occasion Informal discussion w/ pulmonologist Dr Karna Christmas, will follow outpatient, also recs for order home BIPAP which is in process   A-fib Rapid, paroxysmal  Eliquis 5 mg p.o. twice daily p.o. Amio 400 mg twice daily, taper to 200 twice daily after 7 days, then 200 mg daily after 7 days  Metroptolol succinate 25 mg bid po D/c ASA  Demand ischemia Elevated troponin on admission No further workup per cardiology  Moderate MR TEE in November of 2024 showed moderate MR.  Continue to monitor outpatient.  Hypokalemia Replace as needed Monitor BMP  Depression Home sertraline 100 mg daily resumed   Hyperlipidemia Home pravastatin 10 mg daily resumed   Leukocytosis Likely infection + steroids  Treat per above   Protein-calorie malnutrition, severe Suspect moderate to severe, query severe COPD cachexia   Benign essential hypertension Meds as above    Goals of care Palliative care consult      underweight based on BMI: Body mass index is 18.47 kg/m.  Underweight - under 18  overweight - 25 to 29 obese - 30 or more Class 1 obesity: BMI of 30.0 to 34 Class 2 obesity: BMI of 35.0 to 39  Class 3 obesity: BMI of 40.0 to 49 Super Morbid Obesity: BMI 50-59 Super-super Morbid Obesity: BMI 60+ Significantly low or high BMI is associated with higher medical risk.  Weight management advised as adjunct to other disease management and risk reduction treatments    DVT prophylaxis: eliquis IV fluids: none Nutrition: cardiac diet  Central lines / other devices:  none  Code Status: DNR ACP documentation reviewed: none on file in [facility]  [facility] needs: home health PT/OT Medical barriers to dispo: O2 requirement, diuresing, cardiology to see. Expected medical readiness for discharge next 1-2 days if stable on po meds              Subjective / Brief ROS:  Patient reports SOB is much better today Denies CP, no palpitations Pain controlled.  Denies new weakness.  Tolerating diet.  Reports no concerns w/ urination/defecation.   Family Communication: none at this time     Objective Findings:  Vitals:   08/27/23 0801 08/27/23 1157 08/27/23 1432 08/27/23 1636  BP: (!) 141/67 (!) 111/53  (!) 106/48  Pulse: 78 73  77  Resp: 20 19  20   Temp: 98.2 F (36.8 C) 98.3 F (36.8 C)  98.5 F (36.9 C)  TempSrc: Oral Oral  Oral  SpO2: 96% 97% 96% 100%  Weight:      Height:        Intake/Output Summary (Last 24 hours) at 08/27/2023 1703 Last data filed at 08/27/2023 1423 Gross per 24 hour  Intake 980 ml  Output 400 ml  Net 580 ml   Filed Weights   08/21/23 0900  Weight: 45.8 kg    Examination:  Physical Exam Constitutional:      General: She is not in acute distress.    Appearance: She is not ill-appearing.  Cardiovascular:     Rate and Rhythm: Normal rate and regular rhythm.  Pulmonary:     Effort: No respiratory distress.     Breath sounds: Normal breath sounds.  Abdominal:     Palpations: Abdomen is soft.  Musculoskeletal:     Right lower leg: No edema.     Left lower leg: No edema.  Neurological:     Mental Status: She is alert and oriented to person, place, and time. Mental status is at baseline.  Psychiatric:        Mood and Affect: Mood normal.        Behavior: Behavior normal.          Scheduled Medications:   amiodarone  400 mg Oral BID   Followed by   Melene Muller ON 09/02/2023] amiodarone  200 mg Oral BID   Followed by   Melene Muller ON 09/09/2023] amiodarone  200 mg Oral Daily   apixaban  5 mg Oral BID    budesonide (PULMICORT) nebulizer solution  0.25 mg Nebulization BID   cholecalciferol  5,000 Units Oral Q1500   Dupilumab  300 mg Subcutaneous Once   furosemide  40 mg Oral Daily   insulin aspart  0-9 Units Subcutaneous TID WC   lactose free nutrition  237 mL Oral TID WC   levalbuterol  0.63 mg Nebulization TID   losartan  100 mg Oral Daily   metoprolol succinate  25 mg Oral BID   multivitamin with minerals  1 tablet Oral Daily   potassium chloride SA  40 mEq Oral BID   pravastatin  10 mg Oral q1800   sertraline  100 mg Oral Daily    Continuous Infusions:  PRN Medications:  haloperidol lactate, LORazepam, pantoprazole, senna-docusate  Antimicrobials from admission:  Anti-infectives (From admission, onward)    Start     Dose/Rate Route Frequency Ordered Stop   08/22/23 1000  cefTRIAXone (ROCEPHIN) 2 g in sodium chloride 0.9 % 100 mL IVPB        2 g 200 mL/hr over 30 Minutes Intravenous Every 24 hours 08/21/23 1254 08/25/23 0941   08/21/23 2100  doxycycline (VIBRAMYCIN) 100 mg in sodium chloride 0.9 % 250 mL IVPB        100 mg 125 mL/hr over 120 Minutes Intravenous Every 12 hours 08/21/23 1258 08/26/23 1110   08/21/23 1100  cefTRIAXone (ROCEPHIN) 2 g in sodium chloride 0.9 % 100 mL IVPB  Status:  Discontinued        2 g 200 mL/hr over 30 Minutes Intravenous Once 08/21/23 1049 08/21/23 1049   08/21/23 1100  azithromycin (ZITHROMAX) 500 mg in sodium chloride 0.9 % 250 mL IVPB  Status:  Discontinued        500 mg 250 mL/hr over 60 Minutes Intravenous  Once 08/21/23 1049 08/21/23 1049   08/21/23 0930  doxycycline (VIBRAMYCIN) 100 mg in sodium chloride 0.9 % 250 mL IVPB        100 mg 125 mL/hr over 120 Minutes Intravenous  Once 08/21/23 0857 08/21/23 1129   08/21/23 0900  cefTRIAXone (ROCEPHIN) 1 g in sodium chloride 0.9 % 100 mL IVPB        1 g 200 mL/hr over 30 Minutes Intravenous  Once 08/21/23 0857 08/21/23 1610           Data Reviewed:  I have personally reviewed  the following...  CBC: Recent Labs  Lab 08/21/23 0908 08/22/23 0717 08/23/23 0245 08/24/23 0456 08/27/23 0743  WBC 29.4* 24.9* 26.7* 25.8* 23.8*  NEUTROABS 22.5*  --   --   --   --   HGB 13.3 10.7* 11.7* 12.0 11.8*  HCT 41.8 33.9* 35.6* 36.3 35.8*  MCV 107.5* 106.9* 102.9* 101.7* 102.6*  PLT 630* 421* 467* 391 346   Basic Metabolic Panel: Recent Labs  Lab 08/23/23 0245 08/24/23 0456 08/25/23 0523 08/26/23 0504 08/27/23 0743  NA 138 142 143 144  142 142  K 3.8 3.0* 3.7 3.9  3.9 4.2  CL 110 105 103 99  99 97*  CO2 20* 26 32 35*  35* 36*  GLUCOSE 116* 92 108* 99  98 92  BUN 17 14 19 18  18 21   CREATININE 0.55 0.39* 0.57 0.59  0.63 0.63  CALCIUM 8.0* 7.9* 8.1* 8.2*  8.2* 8.6*  MG 2.1  --  1.9  --   --   PHOS 2.0* 1.7* 1.0* 3.2  --    GFR: Estimated Creatinine Clearance: 43.3 mL/min (by C-G formula based on SCr of 0.63 mg/dL). Liver Function Tests: Recent Labs  Lab 08/21/23 0908 08/26/23 0504  AST 38  --   ALT 49*  --   ALKPHOS 75  --   BILITOT 0.7  --   PROT 6.3*  --   ALBUMIN 3.0* 2.4*   No results for input(s): "LIPASE", "AMYLASE" in the last 168 hours. No results for input(s): "AMMONIA" in the last 168 hours. Coagulation Profile: Recent Labs  Lab 08/21/23 0908 08/22/23 0717  INR 1.2 1.5*   Cardiac Enzymes: No results for input(s): "CKTOTAL", "CKMB", "CKMBINDEX", "TROPONINI" in the last 168 hours. BNP (last 3 results) No results for input(s): "PROBNP" in the last 8760 hours. HbA1C: No results for  input(s): "HGBA1C" in the last 72 hours. CBG: Recent Labs  Lab 08/26/23 1625 08/26/23 2115 08/27/23 0808 08/27/23 1153 08/27/23 1636  GLUCAP 178* 175* 105* 141* 120*   Lipid Profile: No results for input(s): "CHOL", "HDL", "LDLCALC", "TRIG", "CHOLHDL", "LDLDIRECT" in the last 72 hours. Thyroid Function Tests: No results for input(s): "TSH", "T4TOTAL", "FREET4", "T3FREE", "THYROIDAB" in the last 72 hours. Anemia Panel: No results for  input(s): "VITAMINB12", "FOLATE", "FERRITIN", "TIBC", "IRON", "RETICCTPCT" in the last 72 hours. Most Recent Urinalysis On File:     Component Value Date/Time   COLORURINE YELLOW (A) 06/29/2023 1448   APPEARANCEUR CLOUDY (A) 06/29/2023 1448   LABSPEC 1.029 06/29/2023 1448   PHURINE 5.0 06/29/2023 1448   GLUCOSEU NEGATIVE 06/29/2023 1448   HGBUR NEGATIVE 06/29/2023 1448   BILIRUBINUR NEGATIVE 06/29/2023 1448   KETONESUR NEGATIVE 06/29/2023 1448   PROTEINUR >=300 (A) 06/29/2023 1448   NITRITE NEGATIVE 06/29/2023 1448   LEUKOCYTESUR NEGATIVE 06/29/2023 1448   Sepsis Labs: @LABRCNTIP (procalcitonin:4,lacticidven:4) Microbiology: Recent Results (from the past 240 hours)  Resp panel by RT-PCR (RSV, Flu A&B, Covid) Anterior Nasal Swab     Status: None   Collection Time: 08/21/23  9:08 AM   Specimen: Anterior Nasal Swab  Result Value Ref Range Status   SARS Coronavirus 2 by RT PCR NEGATIVE NEGATIVE Final    Comment: (NOTE) SARS-CoV-2 target nucleic acids are NOT DETECTED.  The SARS-CoV-2 RNA is generally detectable in upper respiratory specimens during the acute phase of infection. The lowest concentration of SARS-CoV-2 viral copies this assay can detect is 138 copies/mL. A negative result does not preclude SARS-Cov-2 infection and should not be used as the sole basis for treatment or other patient management decisions. A negative result may occur with  improper specimen collection/handling, submission of specimen other than nasopharyngeal swab, presence of viral mutation(s) within the areas targeted by this assay, and inadequate number of viral copies(<138 copies/mL). A negative result must be combined with clinical observations, patient history, and epidemiological information. The expected result is Negative.  Fact Sheet for Patients:  BloggerCourse.com  Fact Sheet for Healthcare Providers:  SeriousBroker.it  This test is no t  yet approved or cleared by the [place] FDA and  has been authorized for detection and/or diagnosis of SARS-CoV-2 by FDA under an Emergency Use Authorization (EUA). This EUA will remain  in effect (meaning this test can be used) for the duration of the COVID-19 declaration under Section 564(b)(1) of the Act, 21 U.S.C.section 360bbb-3(b)(1), unless the authorization is terminated  or revoked sooner.       Influenza A by PCR NEGATIVE NEGATIVE Final   Influenza B by PCR NEGATIVE NEGATIVE Final    Comment: (NOTE) The Xpert Xpress SARS-CoV-2/FLU/RSV plus assay is intended as an aid in the diagnosis of influenza from Nasopharyngeal swab specimens and should not be used as a sole basis for treatment. Nasal washings and aspirates are unacceptable for Xpert Xpress SARS-CoV-2/FLU/RSV testing.  Fact Sheet for Patients: BloggerCourse.com  Fact Sheet for Healthcare Providers: SeriousBroker.it  This test is not yet approved or cleared by the [place] FDA and has been authorized for detection and/or diagnosis of SARS-CoV-2 by FDA under an Emergency Use Authorization (EUA). This EUA will remain in effect (meaning this test can be used) for the duration of the COVID-19 declaration under Section 564(b)(1) of the Act, 21 U.S.C. section 360bbb-3(b)(1), unless the authorization is terminated or revoked.     Resp Syncytial Virus by PCR NEGATIVE NEGATIVE Final  Comment: (NOTE) Fact Sheet for Patients: BloggerCourse.com  Fact Sheet for Healthcare Providers: SeriousBroker.it  This test is not yet approved or cleared by the [place] FDA and has been authorized for detection and/or diagnosis of SARS-CoV-2 by FDA under an Emergency Use Authorization (EUA). This EUA will remain in effect (meaning this test can be used) for the duration of the COVID-19 declaration under Section 564(b)(1)  of the Act, 21 U.S.C. section 360bbb-3(b)(1), unless the authorization is terminated or revoked.  Performed at [facility], [address] [place], [place] [address]   Culture, blood (routine x 2)     Status: None   Collection Time: 08/21/23 11:36 AM   Specimen: BLOOD RIGHT ARM  Result Value Ref Range Status   Specimen Description BLOOD RIGHT ARM  Final   Special Requests   Final    BOTTLES DRAWN AEROBIC AND ANAEROBIC Blood Culture results may not be optimal due to an inadequate volume of blood received in culture bottles   Culture   Final    NO GROWTH 5 DAYS Performed at [facility], [address] [place], [place] [address]    Report Status 08/26/2023 FINAL  Final  Culture, blood (routine x 2)     Status: None   Collection Time: 08/21/23 11:36 AM   Specimen: BLOOD LEFT ARM  Result Value Ref Range Status   Specimen Description BLOOD LEFT ARM  Final   Special Requests   Final    BOTTLES DRAWN AEROBIC AND ANAEROBIC Blood Culture results may not be optimal due to an inadequate volume of blood received in culture bottles   Culture   Final    NO GROWTH 5 DAYS Performed at [facility], [address] [place], [place] [address]    Report Status 08/26/2023 FINAL  Final  Respiratory (~20 pathogens) panel by PCR     Status: None   Collection Time: 08/23/23  4:54 AM   Specimen: Nasopharyngeal Swab; Respiratory  Result Value Ref Range Status   Adenovirus NOT DETECTED NOT DETECTED Final   Coronavirus 229E NOT DETECTED NOT DETECTED Final    Comment: (NOTE) The Coronavirus on the Respiratory Panel, DOES NOT test for the novel  Coronavirus (2019 nCoV)    Coronavirus HKU1 NOT DETECTED NOT DETECTED Final   Coronavirus NL63 NOT DETECTED NOT DETECTED Final   Coronavirus OC43 NOT DETECTED NOT DETECTED Final   Metapneumovirus NOT DETECTED NOT DETECTED Final   Rhinovirus / Enterovirus NOT DETECTED NOT DETECTED Final   Influenza A NOT DETECTED NOT DETECTED  Final   Influenza B NOT DETECTED NOT DETECTED Final   Parainfluenza Virus 1 NOT DETECTED NOT DETECTED Final   Parainfluenza Virus 2 NOT DETECTED NOT DETECTED Final   Parainfluenza Virus 3 NOT DETECTED NOT DETECTED Final   Parainfluenza Virus 4 NOT DETECTED NOT DETECTED Final   Respiratory Syncytial Virus NOT DETECTED NOT DETECTED Final   Bordetella pertussis NOT DETECTED NOT DETECTED Final   Bordetella Parapertussis NOT DETECTED NOT DETECTED Final   Chlamydophila pneumoniae NOT DETECTED NOT DETECTED Final   Mycoplasma pneumoniae NOT DETECTED NOT DETECTED Final    Comment: Performed at [facility], [address], [place], [place] [address]      Radiology Studies last 3 days: [facility] 1 View Result Date: 08/24/2023 CLINICAL DATA:  CHF.  Pneumonia.  COPD. EXAM: PORTABLE CHEST 1 VIEW COMPARISON:  Chest radiograph dated 08/23/2023. FINDINGS: No interval change in bilateral, left greater than right, pulmonary opacities. Small  bilateral pleural effusions again noted. No pneumothorax. Stable cardiac silhouette. No acute osseous pathology. IMPRESSION: 1. No interval change in bilateral, left greater than right, pulmonary opacities. 2. Small bilateral pleural effusions. Electronically Signed   By: Elgie Collard M.D.   On: 08/24/2023 09:33        Taylor Nielsen, DO [facility] 08/27/2023, 5:03 PM    Dictation software may have been used to generate the above note. Typos may occur and escape review in typed/dictated notes. Please contact Dr Lyn Hollingshead directly for clarity if needed.  Staff may message me via secure chat in Epic  but this may not receive an immediate response,  please page me for urgent matters!  If 7PM-7AM, please contact night coverage [email or web address]

## 2023-08-28 ENCOUNTER — Telehealth (HOSPITAL_COMMUNITY): Payer: Self-pay | Admitting: Pharmacy Technician

## 2023-08-28 ENCOUNTER — Other Ambulatory Visit (HOSPITAL_COMMUNITY): Payer: Self-pay

## 2023-08-28 DIAGNOSIS — J441 Chronic obstructive pulmonary disease with (acute) exacerbation: Secondary | ICD-10-CM | POA: Diagnosis not present

## 2023-08-28 DIAGNOSIS — J189 Pneumonia, unspecified organism: Secondary | ICD-10-CM | POA: Diagnosis not present

## 2023-08-28 DIAGNOSIS — A419 Sepsis, unspecified organism: Secondary | ICD-10-CM | POA: Diagnosis not present

## 2023-08-28 DIAGNOSIS — J9621 Acute and chronic respiratory failure with hypoxia: Secondary | ICD-10-CM | POA: Diagnosis not present

## 2023-08-28 DIAGNOSIS — I4891 Unspecified atrial fibrillation: Secondary | ICD-10-CM | POA: Diagnosis not present

## 2023-08-28 LAB — GLUCOSE, CAPILLARY
Glucose-Capillary: 114 mg/dL — ABNORMAL HIGH (ref 70–99)
Glucose-Capillary: 104 mg/dL — ABNORMAL HIGH (ref 70–99)
Glucose-Capillary: 121 mg/dL — ABNORMAL HIGH (ref 70–99)
Glucose-Capillary: 109 mg/dL — ABNORMAL HIGH (ref 70–99)

## 2023-08-28 MED ORDER — ORAL CARE MOUTH RINSE: 15.0000 mL | OROMUCOSAL | Status: DC | PRN

## 2023-08-28 MED ORDER — LEVALBUTEROL HCL 0.63 MG/3ML IN NEBU
0.6300 mg | INHALATION_SOLUTION | Freq: Two times a day (BID) | RESPIRATORY_TRACT | Status: DC
  Administered 2023-08-28 – 2023-08-29 (×2): 0.63 mg via RESPIRATORY_TRACT
  Filled 2023-08-28 (×2): qty 3

## 2023-08-28 MED ORDER — SPIRONOLACTONE 12.5 MG HALF TABLET
12.5000 mg | ORAL_TABLET | Freq: Every day | ORAL | Status: DC
  Administered 2023-08-28: 12.5 mg via ORAL
  Filled 2023-08-28: qty 1

## 2023-08-28 NOTE — Telephone Encounter (Signed)
 Patient Product/process development scientist completed.    The patient is insured through [employer]. Patient has Medicare and is not eligible for a copay card, but may be able to apply for patient assistance or Medicare RX Payment Plan (Patient Must reach out to their plan, if eligible for payment plan), if available.    Ran test claim for Farxiga 10 mg and the current 30 day co-pay is $0.00.  Ran test claim for Jardiance 10 mg and the current 30 day co-pay is $0.00.  This test claim was processed through [facility]- copay amounts may vary at other pharmacies due to pharmacy/plan contracts, or as the patient moves through the different stages of their insurance plan.     Roland Earl, CPHT Pharmacy Technician III Certified Patient Advocate [facility] Patient Advocate Team Direct Number: [phone]  Fax: [phone]

## 2023-08-28 NOTE — TOC Progression Note (Addendum)
 Transition of Care [facility]) - Discharge Note   Patient Details  Name: Celene Pippins MRN: [id number] Date of Birth: [birth year]  [facility] CM/SW Contact:  Truddie Hidden, RN Phone Number: 08/28/2023, 12:32 PM   Clinical Narrative:    Obtained order form for home BIPAP signed. Order form sent to [facility] at [facility].   Patient updated BIPAP order is being processed and [employer] will reach out to her.       Barriers to Discharge: Continued Medical Work up   Patient Goals and CMS Choice            Discharge Placement                       Discharge Plan and Services Additional resources added to the After Visit Summary for                                       Social Drivers of Health (SDOH) Interventions SDOH Screenings   Food Insecurity: No Food Insecurity (08/23/2023)  Housing: Low Risk  (08/23/2023)  Transportation Needs: No Transportation Needs (08/23/2023)  Utilities: Not At Risk (08/23/2023)  Financial Resource Strain: Low Risk  (07/06/2023)   Received from [facility]  Social Connections: Moderately Isolated (08/23/2023)  Tobacco Use: Medium Risk (08/21/2023)     Readmission Risk Interventions     No data to display

## 2023-08-28 NOTE — Plan of Care (Signed)
  Problem: Safety: Goal: Ability to remain free from injury will improve Outcome: Progressing   Problem: Clinical Measurements: Goal: Cardiovascular complication will be avoided Outcome: Progressing   Problem: Clinical Measurements: Goal: Ability to maintain clinical measurements within normal limits will improve Outcome: Progressing   Problem: Activity: Goal: Risk for activity intolerance will decrease Outcome: Progressing

## 2023-08-28 NOTE — Plan of Care (Signed)
 Problem: Fluid Volume: Goal: Hemodynamic stability will improve 08/28/2023 1244 by Blanchard Kelch, RN Outcome: Progressing 08/28/2023 1120 by Blanchard Kelch, RN Outcome: Adequate for Discharge   Problem: Clinical Measurements: Goal: Diagnostic test results will improve 08/28/2023 1244 by Blanchard Kelch, RN Outcome: Progressing 08/28/2023 1120 by Blanchard Kelch, RN Outcome: Adequate for Discharge Goal: Signs and symptoms of infection will decrease 08/28/2023 1244 by Blanchard Kelch, RN Outcome: Progressing 08/28/2023 1120 by Blanchard Kelch, RN Outcome: Adequate for Discharge   Problem: Respiratory: Goal: Ability to maintain adequate ventilation will improve 08/28/2023 1244 by Blanchard Kelch, RN Outcome: Progressing 08/28/2023 1120 by Blanchard Kelch, RN Outcome: Adequate for Discharge   Problem: Education: Goal: Ability to describe self-care measures that may prevent or decrease complications (Diabetes Survival Skills Education) will improve 08/28/2023 1244 by Blanchard Kelch, RN Outcome: Progressing 08/28/2023 1120 by Blanchard Kelch, RN Outcome: Adequate for Discharge Goal: Individualized Educational Video(s) 08/28/2023 1244 by Blanchard Kelch, RN Outcome: Progressing 08/28/2023 1120 by Blanchard Kelch, RN Outcome: Adequate for Discharge   Problem: Coping: Goal: Ability to adjust to condition or change in health will improve 08/28/2023 1244 by Blanchard Kelch, RN Outcome: Progressing 08/28/2023 1120 by Blanchard Kelch, RN Outcome: Adequate for Discharge   Problem: Fluid Volume: Goal: Ability to maintain a balanced intake and output will improve 08/28/2023 1244 by Blanchard Kelch, RN Outcome: Progressing 08/28/2023 1120 by Blanchard Kelch, RN Outcome: Adequate for Discharge   Problem: Health Behavior/Discharge Planning: Goal: Ability to identify and utilize available resources and services will improve 08/28/2023 1244 by Blanchard Kelch, RN Outcome:  Progressing 08/28/2023 1120 by Blanchard Kelch, RN Outcome: Adequate for Discharge Goal: Ability to manage health-related needs will improve 08/28/2023 1244 by Blanchard Kelch, RN Outcome: Progressing 08/28/2023 1120 by Blanchard Kelch, RN Outcome: Adequate for Discharge   Problem: Metabolic: Goal: Ability to maintain appropriate glucose levels will improve 08/28/2023 1244 by Blanchard Kelch, RN Outcome: Progressing 08/28/2023 1120 by Blanchard Kelch, RN Outcome: Adequate for Discharge   Problem: Nutritional: Goal: Maintenance of adequate nutrition will improve 08/28/2023 1244 by Blanchard Kelch, RN Outcome: Progressing 08/28/2023 1120 by Blanchard Kelch, RN Outcome: Adequate for Discharge Goal: Progress toward achieving an optimal weight will improve 08/28/2023 1244 by Blanchard Kelch, RN Outcome: Progressing 08/28/2023 1120 by Blanchard Kelch, RN Outcome: Adequate for Discharge   Problem: Skin Integrity: Goal: Risk for impaired skin integrity will decrease 08/28/2023 1244 by Blanchard Kelch, RN Outcome: Progressing 08/28/2023 1120 by Blanchard Kelch, RN Outcome: Adequate for Discharge   Problem: Tissue Perfusion: Goal: Adequacy of tissue perfusion will improve 08/28/2023 1244 by Blanchard Kelch, RN Outcome: Progressing 08/28/2023 1120 by Blanchard Kelch, RN Outcome: Adequate for Discharge   Problem: Education: Goal: Knowledge of General Education information will improve Description: Including pain rating scale, medication(s)/side effects and non-pharmacologic comfort measures 08/28/2023 1244 by Blanchard Kelch, RN Outcome: Progressing 08/28/2023 1120 by Blanchard Kelch, RN Outcome: Adequate for Discharge   Problem: Health Behavior/Discharge Planning: Goal: Ability to manage health-related needs will improve 08/28/2023 1244 by Blanchard Kelch, RN Outcome: Progressing 08/28/2023 1120 by Blanchard Kelch, RN Outcome: Adequate for Discharge   Problem: Clinical  Measurements: Goal: Ability to maintain clinical measurements within normal limits will improve 08/28/2023 1244 by Blanchard Kelch, RN Outcome: Progressing 08/28/2023 1120 by Blanchard Kelch, RN Outcome: Adequate for Discharge Goal: Will remain free  from infection 08/28/2023 1244 by Blanchard Kelch, RN Outcome: Progressing 08/28/2023 1120 by Blanchard Kelch, RN Outcome: Adequate for Discharge Goal: Diagnostic test results will improve 08/28/2023 1244 by Blanchard Kelch, RN Outcome: Progressing 08/28/2023 1120 by Blanchard Kelch, RN Outcome: Adequate for Discharge Goal: Respiratory complications will improve 08/28/2023 1244 by Blanchard Kelch, RN Outcome: Progressing 08/28/2023 1120 by Blanchard Kelch, RN Outcome: Adequate for Discharge Goal: Cardiovascular complication will be avoided 08/28/2023 1244 by Blanchard Kelch, RN Outcome: Progressing 08/28/2023 1120 by Blanchard Kelch, RN Outcome: Adequate for Discharge   Problem: Activity: Goal: Risk for activity intolerance will decrease 08/28/2023 1244 by Blanchard Kelch, RN Outcome: Progressing 08/28/2023 1120 by Blanchard Kelch, RN Outcome: Adequate for Discharge   Problem: Nutrition: Goal: Adequate nutrition will be maintained 08/28/2023 1244 by Blanchard Kelch, RN Outcome: Progressing 08/28/2023 1120 by Blanchard Kelch, RN Outcome: Adequate for Discharge   Problem: Coping: Goal: Level of anxiety will decrease 08/28/2023 1244 by Blanchard Kelch, RN Outcome: Progressing 08/28/2023 1120 by Blanchard Kelch, RN Outcome: Adequate for Discharge   Problem: Elimination: Goal: Will not experience complications related to bowel motility 08/28/2023 1244 by Blanchard Kelch, RN Outcome: Progressing 08/28/2023 1120 by Blanchard Kelch, RN Outcome: Adequate for Discharge Goal: Will not experience complications related to urinary retention 08/28/2023 1244 by Blanchard Kelch, RN Outcome: Progressing 08/28/2023 1120 by Blanchard Kelch, RN Outcome:  Adequate for Discharge   Problem: Pain Managment: Goal: General experience of comfort will improve and/or be controlled 08/28/2023 1244 by Blanchard Kelch, RN Outcome: Progressing 08/28/2023 1120 by Blanchard Kelch, RN Outcome: Adequate for Discharge   Problem: Safety: Goal: Ability to remain free from injury will improve 08/28/2023 1244 by Blanchard Kelch, RN Outcome: Progressing 08/28/2023 1120 by Blanchard Kelch, RN Outcome: Adequate for Discharge   Problem: Skin Integrity: Goal: Risk for impaired skin integrity will decrease 08/28/2023 1244 by Blanchard Kelch, RN Outcome: Progressing 08/28/2023 1120 by Blanchard Kelch, RN Outcome: Adequate for Discharge

## 2023-08-28 NOTE — Progress Notes (Signed)
 Rounding Note    Patient Name: Taylor Chambers Date of Encounter: 08/28/2023  [facility] Cardiologist: Lorine Bears, MD   Subjective   Patient reports feeling much better today. Remains in sinus rhythm. Remains on 2 L supplemental O2. Euvolemic on exam. K 4.2. Cr 0.63  Inpatient Medications    Scheduled Meds:  amiodarone  400 mg Oral BID   Followed by   Melene Muller ON 09/02/2023] amiodarone  200 mg Oral BID   Followed by   Melene Muller ON 09/09/2023] amiodarone  200 mg Oral Daily   apixaban  5 mg Oral BID   budesonide (PULMICORT) nebulizer solution  0.25 mg Nebulization BID   cholecalciferol  5,000 Units Oral Q1500   furosemide  40 mg Oral Daily   insulin aspart  0-9 Units Subcutaneous TID WC   lactose free nutrition  237 mL Oral TID WC   levalbuterol  0.63 mg Nebulization TID   losartan  100 mg Oral Daily   metoprolol succinate  25 mg Oral BID   multivitamin with minerals  1 tablet Oral Daily   potassium chloride SA  40 mEq Oral BID   pravastatin  10 mg Oral q1800   sertraline  100 mg Oral Daily   Continuous Infusions:  PRN Meds: haloperidol lactate, LORazepam, mouth rinse, pantoprazole, senna-docusate   Vital Signs    Vitals:   08/27/23 1924 08/27/23 1955 08/27/23 2316 08/28/23 0353  BP: 102/60  (!) 108/59 (!) 115/56  Pulse: 81  70 70  Resp: 16  20 20   Temp: 98.4 F (36.9 C)  98.3 F (36.8 C) 98.2 F (36.8 C)  TempSrc: Oral  Axillary Axillary  SpO2: 92% 95% 97% 98%  Weight:      Height:        Intake/Output Summary (Last 24 hours) at 08/28/2023 0737 Last data filed at 08/27/2023 1423 Gross per 24 hour  Intake 240 ml  Output 400 ml  Net -160 ml      08/21/2023    9:00 AM 08/18/2023    2:28 PM 06/29/2023    7:42 AM  Last 3 Weights  Weight (lbs) 100 lb 15.5 oz 101 lb 110 lb 3.7 oz  Weight (kg) 45.8 kg 45.813 kg 50 kg      Telemetry    Sinus rhythm - Personally Reviewed  Physical Exam   GEN: No acute distress.   Neck: No JVD Cardiac: RRR,  no murmurs, rubs, or gallops.  Respiratory: Clear to auscultation bilaterally. GI: Soft, nontender, non-distended  MS: No edema; No deformity. Neuro:  Nonfocal  Psych: Normal affect   Labs    High Sensitivity Troponin:   Recent Labs  Lab 08/21/23 0908 08/21/23 1136  TROPONINIHS 24* 51*     Chemistry Recent Labs  Lab 08/21/23 0908 08/22/23 0717 08/23/23 0245 08/24/23 0456 08/25/23 0523 08/26/23 0504 08/27/23 0743  NA 141   < > 138   < > 143 144  142 142  K 5.2*   < > 3.8   < > 3.7 3.9  3.9 4.2  CL 108   < > 110   < > 103 99  99 97*  CO2 20*   < > 20*   < > 32 35*  35* 36*  GLUCOSE 327*   < > 116*   < > 108* 99  98 92  BUN 32*   < > 17   < > 19 18  18 21   CREATININE 0.83   < >  0.55   < > 0.57 0.59  0.63 0.63  CALCIUM 8.7*   < > 8.0*   < > 8.1* 8.2*  8.2* 8.6*  MG  --   --  2.1  --  1.9  --   --   PROT 6.3*  --   --   --   --   --   --   ALBUMIN 3.0*  --   --   --   --  2.4*  --   AST 38  --   --   --   --   --   --   ALT 49*  --   --   --   --   --   --   ALKPHOS 75  --   --   --   --   --   --   BILITOT 0.7  --   --   --   --   --   --   GFRNONAA >60   < > >60   < > >60 >60  >60 >60  ANIONGAP 13   < > 8   < > 8 10  8 9    < > = values in this interval not displayed.    Lipids No results for input(s): "CHOL", "TRIG", "HDL", "LABVLDL", "LDLCALC", "CHOLHDL" in the last 168 hours.  Hematology Recent Labs  Lab 08/23/23 0245 08/24/23 0456 08/27/23 0743  WBC 26.7* 25.8* 23.8*  RBC 3.46* 3.57* 3.49*  HGB 11.7* 12.0 11.8*  HCT 35.6* 36.3 35.8*  MCV 102.9* 101.7* 102.6*  MCH 33.8 33.6 33.8  MCHC 32.9 33.1 33.0  RDW 14.1 13.8 14.2  PLT 467* 391 346   Thyroid No results for input(s): "TSH", "FREET4" in the last 168 hours.  BNP Recent Labs  Lab 08/21/23 0908 08/23/23 0245  BNP 887.9* 1,377.0*    DDimer No results for input(s): "DDIMER" in the last 168 hours.   Radiology    No results found.  Cardiac Studies   Echo 06/2023 1. Left ventricular  ejection fraction, by estimation, is 55 to 60%. The  left ventricle has normal function. The left ventricle has no regional  wall motion abnormalities. Left ventricular diastolic parameters are  consistent with Grade II diastolic  dysfunction (pseudonormalization).   2. Right ventricular systolic function is normal. The right ventricular  size is normal. There is moderately elevated pulmonary artery systolic  pressure.   3. The mitral valve is degenerative. Moderate to severe mitral valve  regurgitation. No evidence of mitral stenosis.   4. Tricuspid valve regurgitation is mild to moderate.   5. The aortic valve is tricuspid. There is mild thickening of the aortic  valve. Aortic valve regurgitation is trivial. Aortic valve  sclerosis/calcification is present, without any evidence of aortic  stenosis.   6. The inferior vena cava is normal in size with <50% respiratory  variability, suggesting right atrial pressure of 8 mmHg.      Echo 01/2023 1. Left ventricular ejection fraction, by estimation, is 55 to 60%. The  left ventricle has normal function. The left ventricle has no regional  wall motion abnormalities. Left ventricular diastolic parameters are  indeterminate. The average left  ventricular global longitudinal strain is -19.0 %.   2. Right ventricular systolic function is normal. The right ventricular  size is normal. There is normal pulmonary artery systolic pressure. The  estimated right ventricular systolic pressure is 28.8 mmHg.   3. The mitral valve is normal in  structure. Moderate to severe mitral  valve regurgitation. No evidence of mitral stenosis. There is mild late  systolic prolapse of multiple segments of the anterior leaflet of the  mitral valve.   4. Tricuspid valve regurgitation is mild to moderate.   5. The aortic valve is normal in structure. Aortic valve regurgitation is  not visualized. No aortic stenosis is present.   6. The inferior vena cava is normal in  size with greater than 50%  respiratory variability, suggesting right atrial pressure of 3 mmHg.    Cardiac CTA 2023 IMPRESSION: 1. Coronary calcium score of 95.3. This was 59th percentile for age and sex matched control. 2. Normal coronary origin with right dominance. 3. Mild proximal LCx stenosis (25%). 4. Minimal proximal RCA stenosis (<25%). 5. CAD-RADS 2. Mild non-obstructive CAD (25-49%). Consider non-atherosclerotic causes of chest pain. Consider preventive therapy and risk factor modification. 6. Image quality degraded by motion artifacts.  Patient Profile     Taylor Chambers is a 78 y.o. female with a hx of nonobstructive CAD, HFimpEF, mitral valve regurgitation, Paroxysmal Aflutter, HTN, HLD, COPD, tobacco use who is being seen for the continued evaluation of paroxysmal Afib   Assessment & Plan    Paroxysmal atrial fibrillation with RVR - Hx of afib on Eliquis PTA - Admitted with severe sepsis, multifocal PNA, and COPD, subsequently converted to rapid afib and started on IV amiodarone - Converted to NSR 3/19 - Transitioned from IV to oral amiodarone 3/22 - Remains in sinus rhythm  - Continue Toprol 25 mg twice daily and Eliquis 5 mg twice daily  Severe sepsis PNA - Initially requiring BiPAP - CXR showed PNA and small pleural effusions - Remains on 2 L supplemental O2 - Antibiotics per IM  COPD exacerbation Acute on chronic respiratory failure with hypoxia - DuoNebs and steroids per IM  Elevated troponin - Troponin 24>51, suspect supply demand mismatch in the setting of sepsis, PNA, COPD, and rapid afib - Denies chest pain - No further ischemic evaluation indicated at this time  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 - Echo 06/2023 showed LVEF 55-60%, no RWMA, G2DD, mod to severe MR, mild to mod TR - BNP 1377 - I/Os poorly recorded, no weights since 3/17 - Kidney function stable - Transitioned from IV to oral Lasix 40 mg daily yesterday 3/23 - Appears euvolemic on exam   - Continue to monitor kidney function, daily weights, and strict I/Os - Continue Toprol as above and losartan 100 mg daily - Will add spironolactone 12.5 mg daily to further optimize GDMT  Mod MR - TEE in 04/2023 showed mod MR - Continue to monitor as outpatient  For questions or updates, please contact [facility] Please consult [email or web address] for contact info under        Signed, Orion Crook, PA-C  08/28/2023, 7:37 AM

## 2023-08-28 NOTE — Progress Notes (Signed)
 Palliative Care Progress Note, Assessment & Plan   Patient Name: Taylor Chambers       Date: 08/28/2023 DOB: [birth year]  Age: 78 y.o. MRN#: [id number] Attending Physician: Sunnie Nielsen, DO Primary Care Physician: Mick Sell, MD Admit Date: 08/21/2023  Subjective: Patient is sitting up in bed in no apparent distress.  She is awake, alert, and oriented x 4.  She acknowledges my presence and is able to make her wishes known.  Nasal cannula remains in place the patient denies respiratory distress.  Family is at bedside during my visit.  HPI: 78 y.o. female  with past medical history of COPD with chronic respiratory failure (2-3 L [place] at baseline), OSA (CPAP), CAD, former tobacco use, A-fib, HFpEF, HTN, and HLD admitted on 08/21/2023 with shortness of breath.     Of note, patient was admitted 1/18 - 06/28/2023 for AECOPD, A-fib with RVR, and CHF exacerbation and returned the next day 1/23 was discharged on 1/27 for acute on chronic respiratory failure with hypoxia and hypercapnia.   Patient is being treated for severe sepsis with acute organ dysfunction and AECOPD.   PMT was consulted to support patient and family with goals of care discussions.  Of note, patient is familiar to our service as my colleague met with her during her 1/23 - 07/03/2023 admission.  Summary of counseling/coordination of care: Extensive chart review completed prior to meeting patient including labs, vital signs, imaging, progress notes, orders, and available advanced directive documents from current and previous encounters.   After reviewing the patient's chart and assessing the patient at bedside, I spoke with patient in regards to symptom management and goals of care.   Symptoms assessed.  Patient denies shortness of  breath, difficulty breathing, or sensation of air hunger at this time.  She shares that this is the best that she has felt in several days.  She remains hopeful that she can have BiPAP at home as she believes this helps her immensely.  Discussed that TOC is following closely with insurance to provide BiPAP at home.  No other Acute complaints at this time.  No adjustment to [facility] needed at this time.  I again discussed boundaries and goals of care with patient.  She endorses she wants to continue "the flight".  She says some people call her stubborn.  We discussed that she remains focused on wanting to do what ever she can to have the best quality of life.  She understands that her COPD is irreversible and likely will have another exacerbation.  However, she is in agreement to do what ever she can to make the time between now and the next exacerbation as long as possible.  DNR with limited interventions remains this patient again endorses that she would never want artificial means to sustain her life.  No acute palliative needs at this time.  PMT will step back from daily visits as goals are clear.  Please reengage with PMT at patient/family's request, if goals change, or if patient's health deteriorates prior to discharge.  Patient and family made aware that PMT will no longer visit daily.  They have PMT contact info and were encouraged to call with any acute palliative  needs.  Physical Exam Vitals reviewed.  Constitutional:      General: She is not in acute distress.    Appearance: She is normal weight.  HENT:     Head: Normocephalic.     Mouth/Throat:     Mouth: Mucous membranes are moist.  Eyes:     Pupils: Pupils are equal, round, and reactive to light.  Cardiovascular:     Pulses: Normal pulses.  Pulmonary:     Effort: Pulmonary effort is normal. No respiratory distress.     Comments: [place] in place Abdominal:     Palpations: Abdomen is soft.  Skin:    General: Skin is warm and dry.      Coloration: Skin is pale.  Neurological:     Mental Status: She is alert and oriented to person, place, and time.  Psychiatric:        Mood and Affect: Mood normal.        Behavior: Behavior normal.        Thought Content: Thought content normal.        Judgment: Judgment normal.             Total Time 35 minutes   Time spent includes: Detailed review of medical records (labs, imaging, vital signs), medically appropriate exam (mental status, respiratory, cardiac, skin), discussed with treatment team, counseling and educating patient, family and staff, documenting clinical information, medication management and coordination of care.  Samara Deist L. Bonita Quin, DNP, FNP-BC Palliative Medicine Team

## 2023-08-28 NOTE — Progress Notes (Signed)
 Physical Therapy Treatment Patient Details Name: Taylor Chambers MRN: [id number] DOB: [birth year] Today's Date: 08/28/2023   History of Present Illness Pt is a 78 year old female presenting to ED with concerns of respiratory distress; admitted with severe sepsis with acute organ dysfunction and AECOPD.    PMH significant for COPD, hyperlipidemia, heart failure preserved ejection fraction, hypertension, depression    PT Comments  Pt A&Ox4, denied pain. Noted for a small nose bleed upon PT entrance, clean up provided and RN notified. The patient demonstrated great progress towards goals today. modI for  bed mobility, supervision with RW for transfers and ambulation. Pt did self pace and take breaks as needed, but overall shorter breaks than last session. She ambulated ~69ft with RW and supervision with 2L via [place]. Pt reported at home she will doff it, so ambulation attempted again on room air. SpO2 84% upon returning to chair, did increased to 87% but [place] donned to aid in recovery (pt reported and appeared mildly SOB). The patient would benefit from further skilled PT intervention to continue to progress towards goals.     If plan is discharge home, recommend the following: A little help with walking and/or transfers;A little help with bathing/dressing/bathroom;Assistance with cooking/housework;Assist for transportation;Help with stairs or ramp for entrance   Can travel by private vehicle        Equipment Recommendations  None recommended by PT    Recommendations for Other Services       Precautions / Restrictions Precautions Precautions: Fall Recall of Precautions/Restrictions: Intact Restrictions Weight Bearing Restrictions Per Provider Order: No     Mobility  Bed Mobility Overal bed mobility: Modified Independent                  Transfers Overall transfer level: Needs assistance Equipment used: Rolling walker (2 wheels) Transfers: Sit to/from Stand Sit to Stand:  Supervision                Ambulation/Gait Ambulation/Gait assistance: Supervision Gait Distance (Feet):  (54ft, twice)           General Gait Details: pt mildly SOB after second bout without [place], spO2 84%, 2L donned   Stairs             Wheelchair Mobility     Tilt Bed    Modified Rankin (Stroke Patients Only)       Balance Overall balance assessment: Needs assistance Sitting-balance support: Feet supported Sitting balance-Leahy Scale: Good     Standing balance support: No upper extremity supported Standing balance-Leahy Scale: Good                              Communication    Cognition Arousal: Alert Behavior During Therapy: WFL for tasks assessed/performed   PT - Cognitive impairments: No apparent impairments                         Following commands: Intact      Cueing    Exercises      General Comments        Pertinent Vitals/Pain Pain Assessment Pain Assessment: No/denies pain    Home Living                          Prior Function            PT Goals (current goals can now be  found in the care plan section) Progress towards PT goals: Progressing toward goals    Frequency    Min 2X/week      PT Plan      Co-evaluation              AM-PAC PT "6 Clicks" Mobility   Outcome Measure  Help needed turning from your back to your side while in a flat bed without using bedrails?: A Little Help needed moving from lying on your back to sitting on the side of a flat bed without using bedrails?: A Little Help needed moving to and from a bed to a chair (including a wheelchair)?: A Little Help needed standing up from a chair using your arms (e.g., wheelchair or bedside chair)?: A Little Help needed to walk in hospital room?: A Little Help needed climbing 3-5 steps with a railing? : A Little 6 Click Score: 18    End of Session   Activity Tolerance: Patient tolerated treatment  well Patient left: with call bell/phone within reach;in chair;with chair alarm set Nurse Communication: Mobility status PT Visit Diagnosis: Other abnormalities of gait and mobility (R26.89);Difficulty in walking, not elsewhere classified (R26.2);Muscle weakness (generalized) (M62.81)     Time: 2130-8657 PT Time Calculation (min) (ACUTE ONLY): 23 min  Charges:    $Therapeutic Activity: 23-37 mins PT General Charges $$ ACUTE PT VISIT: 1 Visit                     Olga Coaster PT, DPT 9:20 AM,08/28/23

## 2023-08-28 NOTE — Progress Notes (Addendum)
 PROGRESS NOTE    Taylor Chambers   VWU:[id number] DOB: 15-Sep-1945  DOA: 08/21/2023 Date of Service: 08/28/23 which is hospital day 7  PCP: Mick Sell, MD    Hospital course / significant events:   HPI: 78 year old female with history of COPD, hyperlipidemia, heart failure preserved ejection fraction, hypertension, depression, who presents emergency department for chief concerns of respiratory distress via EMS. Per ED nursing documentation, patient was on CPAP on arrival and has labored breathing.   Of note, patient was admitted 1/18 - 06/28/2023 for AECOPD, A-fib with RVR, and CHF exacerbation and returned the next day 1/23 was discharged on 1/27 for acute on chronic respiratory failure with hypoxia and hypercapnia.   03/17: to ED, admitted to hospitalist service. BiPap --. 4L [place]. Chest x-ray favored multifocal pneumonia, doxy initiated, given 1L bolus. Started steroids. Afib RVR, started  03/18: Palliative care consult. Continued IV fluids.  03/19: Overnight AfibRVR, started amiodarone gtt. HR remained elevated, Cardiology consult. 4L O2 --> back on BiPap, lasix. Converted to NSR later this afternoon.  03/20: good UOP and off BiPap, still needing 6L O2 per [place]. Continue diuresing. HR has been controlled  03/21: walked w/ PT and needed BiPap but then back on [place]> Reports BiPap helps w/ SOB. PAF overnight up to 150's --> Currently NSR 70s. Home BiPAP ordered and in process  03/22: TOC/HH working on home BiPAP. Cardio managing transition to po diuretics and rate control  03/23: doing well on po amiodarone, transitioning to po lasix 03/24: home BiPap is in process      Consultants:  Palliative care Cardiology   Procedures/Surgeries: none      ASSESSMENT & PLAN:   Severe sepsis with acute organ dysfunction  Question solely sepsis d/t PNA +/- SIRS component d/t CHF/COPD Treat underlying cause(s) as below  CAP w/ elevation procalcitonin  S/p 5 days abx  Blood  cultures x 2 are neg   COPD exacerbation d/t CAP finished steroid burst w/ prednisone Supplemental O2  Scheduled and PRN DuoNebs  HFpEF acute on chronic  Elevated BNP but this appears chronic Recent Echo, EF 55-60, G2DD, mod-severe MR - will not repeat Echo  Lasix IV to po per cardiology  Strict I&O  Continue toprol, losartan  Cardiology following  Acute on chronic hypoxic respiratory failure  D/t CHF, COPD, CAP BiPap dependent on occasion Patients condition quickly deteriorates without ventilator. Removal of the ventilator may cause serious harm to the patient, exacerbation of condition and hospital readmission. Bilevel/RAD has been tried and failed to maintain or stabilize the patient. Bilevel cannot meet current volume requirements. Patient requires frequent durations of ventilatory support. Intermittent usage is insufficient.   A-fib Rapid, paroxysmal  Eliquis 5 mg p.o. twice daily p.o. Amio 400 mg twice daily, taper to 200 twice daily after 7 days, then 200 mg daily after 7 days  Metroptolol succinate 25 mg bid po D/c ASA  Demand ischemia Elevated troponin on admission No further workup per cardiology  Moderate MR TEE in November of 2024 showed moderate MR.  Continue to monitor outpatient.  Hypokalemia Replace as needed Monitor BMP  Depression Home sertraline 100 mg daily resumed   Hyperlipidemia Home pravastatin 10 mg daily resumed   Leukocytosis Likely infection + steroids  Treat per above   Protein-calorie malnutrition, severe Suspect moderate to severe, query severe COPD cachexia   Benign essential hypertension Meds as above    Goals of care Palliative care consult      underweight based on  BMI: Body mass index is 18.47 kg/m.  Underweight - under 18  overweight - 25 to 29 obese - 30 or more Class 1 obesity: BMI of 30.0 to 34 Class 2 obesity: BMI of 35.0 to 39 Class 3 obesity: BMI of 40.0 to 49 Super Morbid Obesity: BMI 50-59 Super-super  Morbid Obesity: BMI 60+ Significantly low or high BMI is associated with higher medical risk.  Weight management advised as adjunct to other disease management and risk reduction treatments    DVT prophylaxis: eliquis IV fluids: none Nutrition: cardiac diet  Central lines / other devices: none  Code Status: DNR ACP documentation reviewed: none on file in [facility]  TOC needs: home health PT/OT Medical barriers to dispo: O2 requirement, await home BiPap             Subjective / Brief ROS:  Patient reports SOB overnight and back on BiPap Feels no SOB now  Denies CP, no palpitations Pain controlled.  Denies new weakness.  Tolerating diet.  Reports no concerns w/ urination/defecation.   Family Communication: family at bedside on rounds        Objective Findings:  Vitals:   08/27/23 2316 08/28/23 0353 08/28/23 0924 08/28/23 1245  BP: (!) 108/59 (!) 115/56 (!) 94/55 (!) 114/52  Pulse: 70 70 81 83  Resp: 20 20    Temp: 98.3 F (36.8 C) 98.2 F (36.8 C) 97.8 F (36.6 C) (!) 97.5 F (36.4 C)  TempSrc: Axillary Axillary    SpO2: 97% 98% 96% 98%  Weight:      Height:        Intake/Output Summary (Last 24 hours) at 08/28/2023 1447 Last data filed at 08/28/2023 0900 Gross per 24 hour  Intake 240 ml  Output --  Net 240 ml   Filed Weights   08/21/23 0900  Weight: 45.8 kg    Examination:  Physical Exam Constitutional:      General: She is not in acute distress.    Appearance: She is not ill-appearing.  Cardiovascular:     Rate and Rhythm: Normal rate and regular rhythm.  Pulmonary:     Effort: No respiratory distress.     Breath sounds: Normal breath sounds.  Abdominal:     Palpations: Abdomen is soft.  Musculoskeletal:     Right lower leg: No edema.     Left lower leg: No edema.  Neurological:     Mental Status: She is alert and oriented to person, place, and time. Mental status is at baseline.  Psychiatric:        Mood and Affect: Mood normal.         Behavior: Behavior normal.          Scheduled Medications:   amiodarone  400 mg Oral BID   Followed by   Melene Muller ON 09/02/2023] amiodarone  200 mg Oral BID   Followed by   Melene Muller ON 09/09/2023] amiodarone  200 mg Oral Daily   apixaban  5 mg Oral BID   budesonide (PULMICORT) nebulizer solution  0.25 mg Nebulization BID   cholecalciferol  5,000 Units Oral Q1500   furosemide  40 mg Oral Daily   insulin aspart  0-9 Units Subcutaneous TID WC   lactose free nutrition  237 mL Oral TID WC   levalbuterol  0.63 mg Nebulization BID   losartan  100 mg Oral Daily   metoprolol succinate  25 mg Oral BID   multivitamin with minerals  1 tablet Oral Daily   pravastatin  10 mg Oral q1800   sertraline  100 mg Oral Daily   spironolactone  12.5 mg Oral Daily    Continuous Infusions:    PRN Medications:  haloperidol lactate, LORazepam, mouth rinse, pantoprazole, senna-docusate  Antimicrobials from admission:  Anti-infectives (From admission, onward)    Start     Dose/Rate Route Frequency Ordered Stop   08/22/23 1000  cefTRIAXone (ROCEPHIN) 2 g in sodium chloride 0.9 % 100 mL IVPB        2 g 200 mL/hr over 30 Minutes Intravenous Every 24 hours 08/21/23 1254 08/25/23 0941   08/21/23 2100  doxycycline (VIBRAMYCIN) 100 mg in sodium chloride 0.9 % 250 mL IVPB        100 mg 125 mL/hr over 120 Minutes Intravenous Every 12 hours 08/21/23 1258 08/26/23 1110   08/21/23 1100  cefTRIAXone (ROCEPHIN) 2 g in sodium chloride 0.9 % 100 mL IVPB  Status:  Discontinued        2 g 200 mL/hr over 30 Minutes Intravenous Once 08/21/23 1049 08/21/23 1049   08/21/23 1100  azithromycin (ZITHROMAX) 500 mg in sodium chloride 0.9 % 250 mL IVPB  Status:  Discontinued        500 mg 250 mL/hr over 60 Minutes Intravenous  Once 08/21/23 1049 08/21/23 1049   08/21/23 0930  doxycycline (VIBRAMYCIN) 100 mg in sodium chloride 0.9 % 250 mL IVPB        100 mg 125 mL/hr over 120 Minutes Intravenous  Once 08/21/23 0857  08/21/23 1129   08/21/23 0900  cefTRIAXone (ROCEPHIN) 1 g in sodium chloride 0.9 % 100 mL IVPB        1 g 200 mL/hr over 30 Minutes Intravenous  Once 08/21/23 0857 08/21/23 7829           Data Reviewed:  I have personally reviewed the following...  CBC: Recent Labs  Lab 08/22/23 0717 08/23/23 0245 08/24/23 0456 08/27/23 0743  WBC 24.9* 26.7* 25.8* 23.8*  HGB 10.7* 11.7* 12.0 11.8*  HCT 33.9* 35.6* 36.3 35.8*  MCV 106.9* 102.9* 101.7* 102.6*  PLT 421* 467* 391 346   Basic Metabolic Panel: Recent Labs  Lab 08/23/23 0245 08/24/23 0456 08/25/23 0523 08/26/23 0504 08/27/23 0743  NA 138 142 143 144  142 142  K 3.8 3.0* 3.7 3.9  3.9 4.2  CL 110 105 103 99  99 97*  CO2 20* 26 32 35*  35* 36*  GLUCOSE 116* 92 108* 99  98 92  BUN 17 14 19 18  18 21   CREATININE 0.55 0.39* 0.57 0.59  0.63 0.63  CALCIUM 8.0* 7.9* 8.1* 8.2*  8.2* 8.6*  MG 2.1  --  1.9  --   --   PHOS 2.0* 1.7* 1.0* 3.2  --    GFR: Estimated Creatinine Clearance: 43.3 mL/min (by C-G formula based on SCr of 0.63 mg/dL). Liver Function Tests: Recent Labs  Lab 08/26/23 0504  ALBUMIN 2.4*   No results for input(s): "LIPASE", "AMYLASE" in the last 168 hours. No results for input(s): "AMMONIA" in the last 168 hours. Coagulation Profile: Recent Labs  Lab 08/22/23 0717  INR 1.5*   Cardiac Enzymes: No results for input(s): "CKTOTAL", "CKMB", "CKMBINDEX", "TROPONINI" in the last 168 hours. BNP (last 3 results) No results for input(s): "PROBNP" in the last 8760 hours. HbA1C: No results for input(s): "HGBA1C" in the last 72 hours. CBG: Recent Labs  Lab 08/27/23 1153 08/27/23 1636 08/27/23 2128 08/28/23 0918 08/28/23 1229  GLUCAP 141* 120* 152* 114*  104*   Lipid Profile: No results for input(s): "CHOL", "HDL", "LDLCALC", "TRIG", "CHOLHDL", "LDLDIRECT" in the last 72 hours. Thyroid Function Tests: No results for input(s): "TSH", "T4TOTAL", "FREET4", "T3FREE", "THYROIDAB" in the last 72  hours. Anemia Panel: No results for input(s): "VITAMINB12", "FOLATE", "FERRITIN", "TIBC", "IRON", "RETICCTPCT" in the last 72 hours. Most Recent Urinalysis On File:     Component Value Date/Time   COLORURINE YELLOW (A) 06/29/2023 1448   APPEARANCEUR CLOUDY (A) 06/29/2023 1448   LABSPEC 1.029 06/29/2023 1448   PHURINE 5.0 06/29/2023 1448   GLUCOSEU NEGATIVE 06/29/2023 1448   HGBUR NEGATIVE 06/29/2023 1448   BILIRUBINUR NEGATIVE 06/29/2023 1448   KETONESUR NEGATIVE 06/29/2023 1448   PROTEINUR >=300 (A) 06/29/2023 1448   NITRITE NEGATIVE 06/29/2023 1448   LEUKOCYTESUR NEGATIVE 06/29/2023 1448   Sepsis Labs: @LABRCNTIP (procalcitonin:4,lacticidven:4) Microbiology: Recent Results (from the past 240 hours)  Resp panel by RT-PCR (RSV, Flu A&B, Covid) Anterior Nasal Swab     Status: None   Collection Time: 08/21/23  9:08 AM   Specimen: Anterior Nasal Swab  Result Value Ref Range Status   SARS Coronavirus 2 by RT PCR NEGATIVE NEGATIVE Final    Comment: (NOTE) SARS-CoV-2 target nucleic acids are NOT DETECTED.  The SARS-CoV-2 RNA is generally detectable in upper respiratory specimens during the acute phase of infection. The lowest concentration of SARS-CoV-2 viral copies this assay can detect is 138 copies/mL. A negative result does not preclude SARS-Cov-2 infection and should not be used as the sole basis for treatment or other patient management decisions. A negative result may occur with  improper specimen collection/handling, submission of specimen other than nasopharyngeal swab, presence of viral mutation(s) within the areas targeted by this assay, and inadequate number of viral copies(<138 copies/mL). A negative result must be combined with clinical observations, patient history, and epidemiological information. The expected result is Negative.  Fact Sheet for Patients:  BloggerCourse.com  Fact Sheet for Healthcare Providers:   SeriousBroker.it  This test is no t yet approved or cleared by the [place] FDA and  has been authorized for detection and/or diagnosis of SARS-CoV-2 by FDA under an Emergency Use Authorization (EUA). This EUA will remain  in effect (meaning this test can be used) for the duration of the COVID-19 declaration under Section 564(b)(1) of the Act, 21 U.S.C.section 360bbb-3(b)(1), unless the authorization is terminated  or revoked sooner.       Influenza A by PCR NEGATIVE NEGATIVE Final   Influenza B by PCR NEGATIVE NEGATIVE Final    Comment: (NOTE) The Xpert Xpress SARS-CoV-2/FLU/RSV plus assay is intended as an aid in the diagnosis of influenza from Nasopharyngeal swab specimens and should not be used as a sole basis for treatment. Nasal washings and aspirates are unacceptable for Xpert Xpress SARS-CoV-2/FLU/RSV testing.  Fact Sheet for Patients: BloggerCourse.com  Fact Sheet for Healthcare Providers: SeriousBroker.it  This test is not yet approved or cleared by the [place] FDA and has been authorized for detection and/or diagnosis of SARS-CoV-2 by FDA under an Emergency Use Authorization (EUA). This EUA will remain in effect (meaning this test can be used) for the duration of the COVID-19 declaration under Section 564(b)(1) of the Act, 21 U.S.C. section 360bbb-3(b)(1), unless the authorization is terminated or revoked.     Resp Syncytial Virus by PCR NEGATIVE NEGATIVE Final    Comment: (NOTE) Fact Sheet for Patients: BloggerCourse.com  Fact Sheet for Healthcare Providers: SeriousBroker.it  This test is not yet approved or cleared by the [place] FDA  and has been authorized for detection and/or diagnosis of SARS-CoV-2 by FDA under an Emergency Use Authorization (EUA). This EUA will remain in effect (meaning this test can be used) for  the duration of the COVID-19 declaration under Section 564(b)(1) of the Act, 21 U.S.C. section 360bbb-3(b)(1), unless the authorization is terminated or revoked.  Performed at [facility], [address] [place], [place] [address]   Culture, blood (routine x 2)     Status: None   Collection Time: 08/21/23 11:36 AM   Specimen: BLOOD RIGHT ARM  Result Value Ref Range Status   Specimen Description BLOOD RIGHT ARM  Final   Special Requests   Final    BOTTLES DRAWN AEROBIC AND ANAEROBIC Blood Culture results may not be optimal due to an inadequate volume of blood received in culture bottles   Culture   Final    NO GROWTH 5 DAYS Performed at [facility], [address] [place], [place] [address]    Report Status 08/26/2023 FINAL  Final  Culture, blood (routine x 2)     Status: None   Collection Time: 08/21/23 11:36 AM   Specimen: BLOOD LEFT ARM  Result Value Ref Range Status   Specimen Description BLOOD LEFT ARM  Final   Special Requests   Final    BOTTLES DRAWN AEROBIC AND ANAEROBIC Blood Culture results may not be optimal due to an inadequate volume of blood received in culture bottles   Culture   Final    NO GROWTH 5 DAYS Performed at [facility], [address] [place], [place] [address]    Report Status 08/26/2023 FINAL  Final  Respiratory (~20 pathogens) panel by PCR     Status: None   Collection Time: 08/23/23  4:54 AM   Specimen: Nasopharyngeal Swab; Respiratory  Result Value Ref Range Status   Adenovirus NOT DETECTED NOT DETECTED Final   Coronavirus 229E NOT DETECTED NOT DETECTED Final    Comment: (NOTE) The Coronavirus on the Respiratory Panel, DOES NOT test for the novel  Coronavirus (2019 nCoV)    Coronavirus HKU1 NOT DETECTED NOT DETECTED Final   Coronavirus NL63 NOT DETECTED NOT DETECTED Final   Coronavirus OC43 NOT DETECTED NOT DETECTED Final   Metapneumovirus NOT DETECTED NOT DETECTED Final   Rhinovirus / Enterovirus NOT  DETECTED NOT DETECTED Final   Influenza A NOT DETECTED NOT DETECTED Final   Influenza B NOT DETECTED NOT DETECTED Final   Parainfluenza Virus 1 NOT DETECTED NOT DETECTED Final   Parainfluenza Virus 2 NOT DETECTED NOT DETECTED Final   Parainfluenza Virus 3 NOT DETECTED NOT DETECTED Final   Parainfluenza Virus 4 NOT DETECTED NOT DETECTED Final   Respiratory Syncytial Virus NOT DETECTED NOT DETECTED Final   Bordetella pertussis NOT DETECTED NOT DETECTED Final   Bordetella Parapertussis NOT DETECTED NOT DETECTED Final   Chlamydophila pneumoniae NOT DETECTED NOT DETECTED Final   Mycoplasma pneumoniae NOT DETECTED NOT DETECTED Final    Comment: Performed at [facility], [address], [place], [place] [address]      Radiology Studies last 3 days: No results found.       Sunnie Nielsen, DO [facility] 08/28/2023, 2:47 PM    Dictation software may have been used to generate the above note. Typos may occur and escape review in typed/dictated notes. Please contact Dr Lyn Hollingshead directly for clarity if needed.  Staff may message me via secure chat in Epic  but this may not receive an immediate response,  please page me for urgent matters!  If 7PM-7AM, please contact night coverage [email or web address]

## 2023-08-29 DIAGNOSIS — I4891 Unspecified atrial fibrillation: Secondary | ICD-10-CM | POA: Diagnosis not present

## 2023-08-29 DIAGNOSIS — A419 Sepsis, unspecified organism: Secondary | ICD-10-CM | POA: Diagnosis not present

## 2023-08-29 DIAGNOSIS — R6520 Severe sepsis without septic shock: Secondary | ICD-10-CM | POA: Diagnosis not present

## 2023-08-29 LAB — GLUCOSE, CAPILLARY
Glucose-Capillary: 116 mg/dL — ABNORMAL HIGH (ref 70–99)
Glucose-Capillary: 120 mg/dL — ABNORMAL HIGH (ref 70–99)

## 2023-08-29 MED ORDER — FUROSEMIDE 40 MG PO TABS: 40.0000 mg | ORAL_TABLET | Freq: Every day | ORAL | 0 refills | Status: AC

## 2023-08-29 MED ORDER — METOPROLOL SUCCINATE ER 25 MG PO TB24: 25.0000 mg | ORAL_TABLET | Freq: Two times a day (BID) | ORAL | 0 refills | Status: AC

## 2023-08-29 MED ORDER — AMIODARONE HCL 200 MG PO TABS: ORAL_TABLET | ORAL | 0 refills | Status: DC

## 2023-08-29 NOTE — TOC Progression Note (Addendum)
 Transition of Care [facility]) - Progression Note    Patient Details  Name: Taylor Chambers MRN: [id number] Date of Birth: 08/20/1945  Transition of [facility]) CM/SW Contact  Truddie Hidden, RN Phone Number: 08/29/2023, 10:39 AM  Clinical Narrative:     Attempt to contact Mitch at [facility] to confirm status of BIPAP. No answer, left a message.   12:38pm Per Mitch at [facility] patient's BIPAP will be delivered at home.  Patient notified of discharge and BIPAP would delivered today. She was also advised to expect a call from [facility] to schedule her [facility] SOC. Patient stated her spouse will transport her home.   Cheryl from [facility] notified of discharge.  TOC signing off.    Expected Discharge Plan: Home w Home Health Services Barriers to Discharge: Continued Medical Work up  Expected Discharge Plan and Services       Living arrangements for the past 2 months: Single Family Home                                       Social Determinants of Health (SDOH) Interventions SDOH Screenings   Food Insecurity: No Food Insecurity (08/23/2023)  Housing: Low Risk  (08/23/2023)  Transportation Needs: No Transportation Needs (08/23/2023)  Utilities: Not At Risk (08/23/2023)  Financial Resource Strain: Low Risk  (07/06/2023)   Received from [facility]  Social Connections: Moderately Isolated (08/23/2023)  Tobacco Use: Medium Risk (08/21/2023)    Readmission Risk Interventions     No data to display

## 2023-08-29 NOTE — Progress Notes (Signed)
 Rounding Note    Patient Name: Taylor Chambers Date of Encounter: 08/29/2023  [facility] Cardiologist: Lorine Bears, MD   Subjective   She feels back to baseline.  She is maintaining sinus rhythm.  Inpatient Medications    Scheduled Meds:  amiodarone  400 mg Oral BID   Followed by   Melene Muller ON 09/02/2023] amiodarone  200 mg Oral BID   Followed by   Melene Muller ON 09/09/2023] amiodarone  200 mg Oral Daily   apixaban  5 mg Oral BID   budesonide (PULMICORT) nebulizer solution  0.25 mg Nebulization BID   cholecalciferol  5,000 Units Oral Q1500   furosemide  40 mg Oral Daily   insulin aspart  0-9 Units Subcutaneous TID WC   lactose free nutrition  237 mL Oral TID WC   levalbuterol  0.63 mg Nebulization BID   losartan  100 mg Oral Daily   metoprolol succinate  25 mg Oral BID   multivitamin with minerals  1 tablet Oral Daily   pravastatin  10 mg Oral q1800   sertraline  100 mg Oral Daily   Continuous Infusions:  PRN Meds: haloperidol lactate, LORazepam, mouth rinse, pantoprazole, senna-docusate   Vital Signs    Vitals:   08/29/23 0010 08/29/23 0502 08/29/23 0830 08/29/23 0833  BP: 112/60 108/79 131/85   Pulse: 67 63 67   Resp: 19 17 16    Temp: 98 F (36.7 C)  97.7 F (36.5 C)   TempSrc: Oral     SpO2: 98% 100% 99% 99%  Weight:      Height:       No intake or output data in the 24 hours ending 08/29/23 0948     08/21/2023    9:00 AM 08/18/2023    2:28 PM 06/29/2023    7:42 AM  Last 3 Weights  Weight (lbs) 100 lb 15.5 oz 101 lb 110 lb 3.7 oz  Weight (kg) 45.8 kg 45.813 kg 50 kg      Telemetry    Sinus rhythm - Personally Reviewed  Physical Exam   GEN: No acute distress.   Neck: No JVD Cardiac: RRR, no murmurs, rubs, or gallops.  Respiratory: Clear to auscultation bilaterally. GI: Soft, nontender, non-distended  MS: No edema; No deformity. Neuro:  Nonfocal  Psych: Normal affect   Labs    High Sensitivity Troponin:   Recent Labs  Lab  08/21/23 0908 08/21/23 1136  TROPONINIHS 24* 51*     Chemistry Recent Labs  Lab 08/23/23 0245 08/24/23 0456 08/25/23 0523 08/26/23 0504 08/27/23 0743  NA 138   < > 143 144  142 142  K 3.8   < > 3.7 3.9  3.9 4.2  CL 110   < > 103 99  99 97*  CO2 20*   < > 32 35*  35* 36*  GLUCOSE 116*   < > 108* 99  98 92  BUN 17   < > 19 18  18 21   CREATININE 0.55   < > 0.57 0.59  0.63 0.63  CALCIUM 8.0*   < > 8.1* 8.2*  8.2* 8.6*  MG 2.1  --  1.9  --   --   ALBUMIN  --   --   --  2.4*  --   GFRNONAA >60   < > >60 >60  >60 >60  ANIONGAP 8   < > 8 10  8 9    < > = values in this interval not displayed.  Lipids No results for input(s): "CHOL", "TRIG", "HDL", "LABVLDL", "LDLCALC", "CHOLHDL" in the last 168 hours.  Hematology Recent Labs  Lab 08/23/23 0245 08/24/23 0456 08/27/23 0743  WBC 26.7* 25.8* 23.8*  RBC 3.46* 3.57* 3.49*  HGB 11.7* 12.0 11.8*  HCT 35.6* 36.3 35.8*  MCV 102.9* 101.7* 102.6*  MCH 33.8 33.6 33.8  MCHC 32.9 33.1 33.0  RDW 14.1 13.8 14.2  PLT 467* 391 346   Thyroid No results for input(s): "TSH", "FREET4" in the last 168 hours.  BNP Recent Labs  Lab 08/23/23 0245  BNP 1,377.0*    DDimer No results for input(s): "DDIMER" in the last 168 hours.   Radiology    No results found.  Cardiac Studies   Echo 06/2023 1. Left ventricular ejection fraction, by estimation, is 55 to 60%. The  left ventricle has normal function. The left ventricle has no regional  wall motion abnormalities. Left ventricular diastolic parameters are  consistent with Grade II diastolic  dysfunction (pseudonormalization).   2. Right ventricular systolic function is normal. The right ventricular  size is normal. There is moderately elevated pulmonary artery systolic  pressure.   3. The mitral valve is degenerative. Moderate to severe mitral valve  regurgitation. No evidence of mitral stenosis.   4. Tricuspid valve regurgitation is mild to moderate.   5. The aortic valve is  tricuspid. There is mild thickening of the aortic  valve. Aortic valve regurgitation is trivial. Aortic valve  sclerosis/calcification is present, without any evidence of aortic  stenosis.   6. The inferior vena cava is normal in size with <50% respiratory  variability, suggesting right atrial pressure of 8 mmHg.      Echo 01/2023 1. Left ventricular ejection fraction, by estimation, is 55 to 60%. The  left ventricle has normal function. The left ventricle has no regional  wall motion abnormalities. Left ventricular diastolic parameters are  indeterminate. The average left  ventricular global longitudinal strain is -19.0 %.   2. Right ventricular systolic function is normal. The right ventricular  size is normal. There is normal pulmonary artery systolic pressure. The  estimated right ventricular systolic pressure is 28.8 mmHg.   3. The mitral valve is normal in structure. Moderate to severe mitral  valve regurgitation. No evidence of mitral stenosis. There is mild late  systolic prolapse of multiple segments of the anterior leaflet of the  mitral valve.   4. Tricuspid valve regurgitation is mild to moderate.   5. The aortic valve is normal in structure. Aortic valve regurgitation is  not visualized. No aortic stenosis is present.   6. The inferior vena cava is normal in size with greater than 50%  respiratory variability, suggesting right atrial pressure of 3 mmHg.    Cardiac CTA 2023 IMPRESSION: 1. Coronary calcium score of 95.3. This was 59th percentile for age and sex matched control. 2. Normal coronary origin with right dominance. 3. Mild proximal LCx stenosis (25%). 4. Minimal proximal RCA stenosis (<25%). 5. CAD-RADS 2. Mild non-obstructive CAD (25-49%). Consider non-atherosclerotic causes of chest pain. Consider preventive therapy and risk factor modification. 6. Image quality degraded by motion artifacts.  Patient Profile     Abigayl Hor is a 78 y.o. female with  a hx of nonobstructive CAD, HFimpEF, mitral valve regurgitation, Paroxysmal Aflutter, HTN, HLD, COPD, tobacco use who is being seen for the continued evaluation of paroxysmal Afib   Assessment & Plan    Paroxysmal atrial fibrillation with RVR - Hx of afib on Eliquis  PTA - Admitted with severe sepsis, multifocal PNA, and COPD, subsequently converted to rapid afib and started on IV amiodarone - Converted to NSR 3/19 - Transitioned from IV to oral amiodarone 3/22 - Remains in sinus rhythm  - Continue Toprol 25 mg twice daily and Eliquis 5 mg twice daily Will plan on using amiodarone for only 2 to 3 months and then discontinue given her underlying lung disease.  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  Mod MR - TEE in 04/2023 showed mod MR - Continue to monitor as outpatient   The patient can be discharged home from a cardiac standpoint.  We will schedule a follow-up visit in our office within 2 weeks.  For questions or updates, please contact [facility] Please consult [email or web address] for contact info under        Signed, Lorine Bears, MD  08/29/2023, 9:48 AM

## 2023-08-29 NOTE — Discharge Summary (Signed)
 Physician Discharge Summary   Patient: Taylor Chambers MRN: [id number]  DOB: 1945/11/06   Admit:     Date of Admission: 08/21/2023 Admitted from: home   Discharge: Date of discharge: 08/29/23 Disposition: Home health Condition at discharge: good  CODE STATUS: DNR     Discharge Physician: Sunnie Nielsen, DO [facility]     PCP: Mick Sell, MD  Recommendations for Outpatient Follow-up:  Follow up with PCP Mick Sell, MD in 1-2 weeks recheck COPD and CHF Follow up as directed with cardiology and pulmonology    Discharge Instructions     (HEART FAILURE PATIENTS) Call MD:  Anytime you have any of the following symptoms: 1) 3 pound weight gain in 24 hours or 5 pounds in 1 week 2) shortness of breath, with or without a dry hacking cough 3) swelling in the hands, feet or stomach 4) if you have to sleep on extra pillows at night in order to breathe.   Complete by: As directed    Diet - low sodium heart healthy   Complete by: As directed    Increase activity slowly   Complete by: As directed          Discharge Diagnoses: Principal Problem:   Severe sepsis with acute organ dysfunction (HCC) Active Problems:   COPD exacerbation (HCC)   Tobacco use disorder   Benign essential hypertension   Protein-calorie malnutrition, severe (HCC)   Leukocytosis   Atrial fibrillation with RVR (HCC)   Severe mitral valve regurgitation   SOB (shortness of breath)   COPD (chronic obstructive pulmonary disease) (HCC)   Acute on chronic respiratory failure with hypoxia and hypercapnia (HCC)   Hyperlipidemia   Depression   Multifocal pneumonia   Acute on chronic heart failure with preserved ejection fraction (HFpEF) [facility])       Hospital course / significant events:   HPI: 78 year old female with history of COPD, hyperlipidemia, heart failure preserved ejection fraction, hypertension, depression, who presents emergency department for chief concerns  of respiratory distress via EMS. Per ED nursing documentation, patient was on CPAP on arrival and has labored breathing.   Of note, patient was admitted 1/18 - 06/28/2023 for AECOPD, A-fib with RVR, and CHF exacerbation and returned the next day 1/23 was discharged on 1/27 for acute on chronic respiratory failure with hypoxia and hypercapnia.   03/17: to ED, admitted to hospitalist service. BiPap --. 4L [place]. Chest x-ray favored multifocal pneumonia, doxy initiated, given 1L bolus. Started steroids. Afib RVR, started  03/18: Palliative care consult. Continued IV fluids.  03/19: Overnight AfibRVR, started amiodarone gtt. HR remained elevated, Cardiology consult. 4L O2 --> back on BiPap, lasix. Converted to NSR later this afternoon.  03/20: good UOP and off BiPap, still needing 6L O2 per [place]. Continue diuresing. HR has been controlled  03/21: walked w/ PT and needed BiPap but then back on [place]> Reports BiPap helps w/ SOB. PAF overnight up to 150's --> Currently NSR 70s. Home BiPAP ordered and in process  03/22: TOC/HH working on home BiPAP. Cardio managing transition to po diuretics and rate control  03/23: doing well on po amiodarone, transitioning to po lasix. Per cardiology ok to discharge  03/24: home BiPap in process 03/25: confirmed home BiPap, pt stable for discharge      Consultants:  Palliative care Cardiology   Procedures/Surgeries: none      ASSESSMENT & PLAN:   Severe sepsis with acute organ dysfunction - resolved  Question solely sepsis  d/t PNA +/- SIRS component d/t CHF/COPD Treat underlying cause(s) as below  CAP  S/p 5 days abx   COPD exacerbation d/t CAP finished steroid burst w/ prednisone Supplemental O2 continue Resume home COPD Tx Follow w/ pulmonology   HFpEF acute on chronic  Elevated BNP but this appears chronic Recent Echo, EF 55-60, G2DD, mod-severe MR - will not repeat Echo  Lasix po  Daily weights Salt restriction   Continue toprol, losartan   Cardiology following  Acute on chronic hypoxic respiratory failure  Patients condition quickly deteriorates without ventilator. Removal of the ventilator may cause serious harm to the patient, exacerbation of condition and hospital readmission. Bilevel/RAD has been tried and failed to maintain or stabilize the patient. Bilevel cannot meet current volume requirements. Patient requires frequent durations of ventilatory support. Intermittent usage is insufficient.  BiPap arranged for home use  A-fib Rapid, paroxysmal  Eliquis 5 mg p.o. twice daily p.o. Amio 400 mg twice daily, taper to 200 twice daily after 7 days, then 200 mg daily after 7 days  Metroptolol succinate 25 mg bid po D/c ASA  Demand ischemia Elevated troponin on admission No further workup per cardiology  Moderate MR TEE in November of 2024 showed moderate MR.  Continue to monitor outpatient.  Hypokalemia Replace as needed Monitor BMP  Depression sertraline 100 mg daily    Hyperlipidemia pravastatin 10 mg daily    Leukocytosis Likely infection + steroids  Treat per above   Protein-calorie malnutrition, severe Suspect moderate to severe, query severe COPD cachexia   Benign essential hypertension Meds as above    Goals of care Palliative care consult      underweight based on BMI: Body mass index is 18.47 kg/m.  Underweight - under 18  overweight - 25 to 29 obese - 30 or more Class 1 obesity: BMI of 30.0 to 34 Class 2 obesity: BMI of 35.0 to 39 Class 3 obesity: BMI of 40.0 to 49 Super Morbid Obesity: BMI 50-59 Super-super Morbid Obesity: BMI 60+ Significantly low or high BMI is associated with higher medical risk.  Weight management advised as adjunct to other disease management and risk reduction treatments         Discharge Instructions  Allergies as of 08/29/2023       Reactions   Bupropion Other (See Comments)   Foggy brain        Medication List     STOP taking these  medications    aspirin EC 81 MG tablet   azithromycin 250 MG tablet Commonly known as: ZITHROMAX   dexamethasone 4 MG tablet Commonly known as: DECADRON   potassium chloride 20 MEQ packet Commonly known as: KLOR-CON   sulfamethoxazole-trimethoprim 400-80 MG tablet Commonly known as: BACTRIM       TAKE these medications    albuterol 108 (90 Base) MCG/ACT inhaler Commonly known as: VENTOLIN HFA Inhale 2 puffs into the lungs every 6 (six) hours as needed for wheezing or shortness of breath.   amiodarone 200 MG tablet Commonly known as: PACERONE Take 2 tablets (400 mg total) by mouth 2 (two) times daily for 4 days, THEN 1 tablet (200 mg total) 2 (two) times daily for 7 days, THEN 1 tablet (200 mg total) daily. Start taking on: August 29, 2023 What changed: See the new instructions.   apixaban 5 MG Tabs tablet Commonly known as: ELIQUIS Take 1 tablet (5 mg total) by mouth 2 (two) times daily.   CALCIUM 500 PO Take 500 mg by mouth  daily at 12 noon.   Dupilumab 300 MG/2ML Soaj Inject 300 mg into the skin every 14 (fourteen) days. Saturday   EPINEPHrine 0.3 mg/0.3 mL Soaj injection Commonly known as: EPI-PEN Inject 0.3 mg into the muscle as needed for anaphylaxis.   furosemide 40 MG tablet Commonly known as: LASIX Take 1 tablet (40 mg total) by mouth daily. Start taking on: August 30, 2023 What changed:  medication strength how much to take how to take this when to take this additional instructions   ipratropium 0.06 % nasal spray Commonly known as: ATROVENT Place into the nose.   ipratropium-albuterol 0.5-2.5 (3) MG/3ML Soln Commonly known as: DUONEB Take 3 mLs by nebulization 2 (two) times daily. Mix with budesonide   losartan 100 MG tablet Commonly known as: COZAAR Take by mouth.   metoprolol succinate 25 MG 24 hr tablet Commonly known as: TOPROL-XL Take 1 tablet (25 mg total) by mouth 2 (two) times daily. What changed:  medication strength when to  take this additional instructions   Multi-Vitamin tablet Take 1 tablet by mouth daily.   Ohtuvayre 3 MG/2.5ML Susp Generic drug: Ensifentrine Take 3 mg by nebulization daily.   omeprazole 40 MG capsule Commonly known as: PRILOSEC Take 40 mg by mouth daily as needed (Heartburn).   potassium chloride SA 20 MEQ tablet Commonly known as: KLOR-CON M Take 20 mEq by mouth daily.   pravastatin 10 MG tablet Commonly known as: PRAVACHOL Take 10 mg by mouth daily.   PRESERVISION AREDS 2+MULTI VIT PO Take 2 tablets by mouth daily.   sertraline 100 MG tablet Commonly known as: ZOLOFT Take 1 tablet (100 mg total) by mouth daily.   Vitamin D3 1.25 MG (50000 UT) Caps Take 5,000 Units by mouth daily in the afternoon.               Durable Medical Equipment  (From admission, onward)           Start     Ordered   08/25/23 1607  For home use only DME Bipap  Once       Question:  Length of Need  Answer:  Lifetime   08/25/23 1607              Allergies  Allergen Reactions   Bupropion Other (See Comments)    Foggy brain     Subjective: pt feeling well this morning, SOB at baseline, no SOB at rest, no CP/palpitations   Discharge Exam: BP (!) 152/126 (BP Location: Left Arm)   Pulse 69   Temp (!) 97.5 F (36.4 C)   Resp 16   Ht 5\' 2"  (1.575 m)   Wt 45.8 kg   SpO2 98%   BMI 18.47 kg/m  General: Pt is alert, awake, not in acute distress Cardiovascular: RRR, S1/S2 +, no rubs, no gallops Respiratory: CTA bilaterally but diminished  Abdominal: Soft, NT, ND, bowel sounds + Extremities: no edema, no cyanosis     The results of significant diagnostics from this hospitalization (including imaging, microbiology, ancillary and laboratory) are listed below for reference.     Microbiology: Recent Results (from the past 240 hours)  Resp panel by RT-PCR (RSV, Flu A&B, Covid) Anterior Nasal Swab     Status: None   Collection Time: 08/21/23  9:08 AM   Specimen:  Anterior Nasal Swab  Result Value Ref Range Status   SARS Coronavirus 2 by RT PCR NEGATIVE NEGATIVE Final    Comment: (NOTE) SARS-CoV-2 target nucleic acids are NOT  DETECTED.  The SARS-CoV-2 RNA is generally detectable in upper respiratory specimens during the acute phase of infection. The lowest concentration of SARS-CoV-2 viral copies this assay can detect is 138 copies/mL. A negative result does not preclude SARS-Cov-2 infection and should not be used as the sole basis for treatment or other patient management decisions. A negative result may occur with  improper specimen collection/handling, submission of specimen other than nasopharyngeal swab, presence of viral mutation(s) within the areas targeted by this assay, and inadequate number of viral copies(<138 copies/mL). A negative result must be combined with clinical observations, patient history, and epidemiological information. The expected result is Negative.  Fact Sheet for Patients:  BloggerCourse.com  Fact Sheet for Healthcare Providers:  SeriousBroker.it  This test is no t yet approved or cleared by the [place] FDA and  has been authorized for detection and/or diagnosis of SARS-CoV-2 by FDA under an Emergency Use Authorization (EUA). This EUA will remain  in effect (meaning this test can be used) for the duration of the COVID-19 declaration under Section 564(b)(1) of the Act, 21 U.S.C.section 360bbb-3(b)(1), unless the authorization is terminated  or revoked sooner.       Influenza A by PCR NEGATIVE NEGATIVE Final   Influenza B by PCR NEGATIVE NEGATIVE Final    Comment: (NOTE) The Xpert Xpress SARS-CoV-2/FLU/RSV plus assay is intended as an aid in the diagnosis of influenza from Nasopharyngeal swab specimens and should not be used as a sole basis for treatment. Nasal washings and aspirates are unacceptable for Xpert Xpress SARS-CoV-2/FLU/RSV testing.  Fact  Sheet for Patients: BloggerCourse.com  Fact Sheet for Healthcare Providers: SeriousBroker.it  This test is not yet approved or cleared by the [place] FDA and has been authorized for detection and/or diagnosis of SARS-CoV-2 by FDA under an Emergency Use Authorization (EUA). This EUA will remain in effect (meaning this test can be used) for the duration of the COVID-19 declaration under Section 564(b)(1) of the Act, 21 U.S.C. section 360bbb-3(b)(1), unless the authorization is terminated or revoked.     Resp Syncytial Virus by PCR NEGATIVE NEGATIVE Final    Comment: (NOTE) Fact Sheet for Patients: BloggerCourse.com  Fact Sheet for Healthcare Providers: SeriousBroker.it  This test is not yet approved or cleared by the [place] FDA and has been authorized for detection and/or diagnosis of SARS-CoV-2 by FDA under an Emergency Use Authorization (EUA). This EUA will remain in effect (meaning this test can be used) for the duration of the COVID-19 declaration under Section 564(b)(1) of the Act, 21 U.S.C. section 360bbb-3(b)(1), unless the authorization is terminated or revoked.  Performed at [facility], [address] [place], [place] [address]   Culture, blood (routine x 2)     Status: None   Collection Time: 08/21/23 11:36 AM   Specimen: BLOOD RIGHT ARM  Result Value Ref Range Status   Specimen Description BLOOD RIGHT ARM  Final   Special Requests   Final    BOTTLES DRAWN AEROBIC AND ANAEROBIC Blood Culture results may not be optimal due to an inadequate volume of blood received in culture bottles   Culture   Final    NO GROWTH 5 DAYS Performed at [facility], [address], [place], [place] [address]    Report Status 08/26/2023 FINAL  Final  Culture, blood (routine x 2)     Status: None   Collection Time: 08/21/23 11:36 AM   Specimen: BLOOD  LEFT ARM  Result Value Ref Range Status  Specimen Description BLOOD LEFT ARM  Final   Special Requests   Final    BOTTLES DRAWN AEROBIC AND ANAEROBIC Blood Culture results may not be optimal due to an inadequate volume of blood received in culture bottles   Culture   Final    NO GROWTH 5 DAYS Performed at [facility], [address] [place], [place] [address]    Report Status 08/26/2023 FINAL  Final  Respiratory (~20 pathogens) panel by PCR     Status: None   Collection Time: 08/23/23  4:54 AM   Specimen: Nasopharyngeal Swab; Respiratory  Result Value Ref Range Status   Adenovirus NOT DETECTED NOT DETECTED Final   Coronavirus 229E NOT DETECTED NOT DETECTED Final    Comment: (NOTE) The Coronavirus on the Respiratory Panel, DOES NOT test for the novel  Coronavirus (2019 nCoV)    Coronavirus HKU1 NOT DETECTED NOT DETECTED Final   Coronavirus NL63 NOT DETECTED NOT DETECTED Final   Coronavirus OC43 NOT DETECTED NOT DETECTED Final   Metapneumovirus NOT DETECTED NOT DETECTED Final   Rhinovirus / Enterovirus NOT DETECTED NOT DETECTED Final   Influenza A NOT DETECTED NOT DETECTED Final   Influenza B NOT DETECTED NOT DETECTED Final   Parainfluenza Virus 1 NOT DETECTED NOT DETECTED Final   Parainfluenza Virus 2 NOT DETECTED NOT DETECTED Final   Parainfluenza Virus 3 NOT DETECTED NOT DETECTED Final   Parainfluenza Virus 4 NOT DETECTED NOT DETECTED Final   Respiratory Syncytial Virus NOT DETECTED NOT DETECTED Final   Bordetella pertussis NOT DETECTED NOT DETECTED Final   Bordetella Parapertussis NOT DETECTED NOT DETECTED Final   Chlamydophila pneumoniae NOT DETECTED NOT DETECTED Final   Mycoplasma pneumoniae NOT DETECTED NOT DETECTED Final    Comment: Performed at [facility], [address] [place], [place] [address]     Labs: BNP (last 3 results) Recent Labs    06/30/23 0636 08/21/23 0908 08/23/23 0245  BNP 1,489.4* 887.9* 1,377.0*   Basic Metabolic  Panel: Recent Labs  Lab 08/23/23 0245 08/24/23 0456 08/25/23 0523 08/26/23 0504 08/27/23 0743  NA 138 142 143 144  142 142  K 3.8 3.0* 3.7 3.9  3.9 4.2  CL 110 105 103 99  99 97*  CO2 20* 26 32 35*  35* 36*  GLUCOSE 116* 92 108* 99  98 92  BUN 17 14 19 18  18 21   CREATININE 0.55 0.39* 0.57 0.59  0.63 0.63  CALCIUM 8.0* 7.9* 8.1* 8.2*  8.2* 8.6*  MG 2.1  --  1.9  --   --   PHOS 2.0* 1.7* 1.0* 3.2  --    Liver Function Tests: Recent Labs  Lab 08/26/23 0504  ALBUMIN 2.4*   No results for input(s): "LIPASE", "AMYLASE" in the last 168 hours. No results for input(s): "AMMONIA" in the last 168 hours. CBC: Recent Labs  Lab 08/23/23 0245 08/24/23 0456 08/27/23 0743  WBC 26.7* 25.8* 23.8*  HGB 11.7* 12.0 11.8*  HCT 35.6* 36.3 35.8*  MCV 102.9* 101.7* 102.6*  PLT 467* 391 346   Cardiac Enzymes: No results for input(s): "CKTOTAL", "CKMB", "CKMBINDEX", "TROPONINI" in the last 168 hours. BNP: Invalid input(s): "POCBNP" CBG: Recent Labs  Lab 08/28/23 1229 08/28/23 1625 08/28/23 2131 08/29/23 0832 08/29/23 1139  GLUCAP 104* 121* 109* 116* 120*   D-Dimer No results for input(s): "DDIMER" in the last 72 hours. Hgb A1c No results for input(s): "HGBA1C" in the last 72 hours. Lipid Profile No results for input(s): "CHOL", "HDL", "  LDLCALC", "TRIG", "CHOLHDL", "LDLDIRECT" in the last 72 hours. Thyroid function studies No results for input(s): "TSH", "T4TOTAL", "T3FREE", "THYROIDAB" in the last 72 hours.  Invalid input(s): "FREET3" Anemia work up No results for input(s): "VITAMINB12", "FOLATE", "FERRITIN", "TIBC", "IRON", "RETICCTPCT" in the last 72 hours. Urinalysis    Component Value Date/Time   COLORURINE YELLOW (A) 06/29/2023 1448   APPEARANCEUR CLOUDY (A) 06/29/2023 1448   LABSPEC 1.029 06/29/2023 1448   PHURINE 5.0 06/29/2023 1448   GLUCOSEU NEGATIVE 06/29/2023 1448   HGBUR NEGATIVE 06/29/2023 1448   BILIRUBINUR NEGATIVE 06/29/2023 1448   KETONESUR  NEGATIVE 06/29/2023 1448   PROTEINUR >=300 (A) 06/29/2023 1448   NITRITE NEGATIVE 06/29/2023 1448   LEUKOCYTESUR NEGATIVE 06/29/2023 1448   Sepsis Labs Recent Labs  Lab 08/23/23 0245 08/24/23 0456 08/27/23 0743  WBC 26.7* 25.8* 23.8*   Microbiology Recent Results (from the past 240 hours)  Resp panel by RT-PCR (RSV, Flu A&B, Covid) Anterior Nasal Swab     Status: None   Collection Time: 08/21/23  9:08 AM   Specimen: Anterior Nasal Swab  Result Value Ref Range Status   SARS Coronavirus 2 by RT PCR NEGATIVE NEGATIVE Final    Comment: (NOTE) SARS-CoV-2 target nucleic acids are NOT DETECTED.  The SARS-CoV-2 RNA is generally detectable in upper respiratory specimens during the acute phase of infection. The lowest concentration of SARS-CoV-2 viral copies this assay can detect is 138 copies/mL. A negative result does not preclude SARS-Cov-2 infection and should not be used as the sole basis for treatment or other patient management decisions. A negative result may occur with  improper specimen collection/handling, submission of specimen other than nasopharyngeal swab, presence of viral mutation(s) within the areas targeted by this assay, and inadequate number of viral copies(<138 copies/mL). A negative result must be combined with clinical observations, patient history, and epidemiological information. The expected result is Negative.  Fact Sheet for Patients:  BloggerCourse.com  Fact Sheet for Healthcare Providers:  SeriousBroker.it  This test is no t yet approved or cleared by the [place] FDA and  has been authorized for detection and/or diagnosis of SARS-CoV-2 by FDA under an Emergency Use Authorization (EUA). This EUA will remain  in effect (meaning this test can be used) for the duration of the COVID-19 declaration under Section 564(b)(1) of the Act, 21 U.S.C.section 360bbb-3(b)(1), unless the authorization is  terminated  or revoked sooner.       Influenza A by PCR NEGATIVE NEGATIVE Final   Influenza B by PCR NEGATIVE NEGATIVE Final    Comment: (NOTE) The Xpert Xpress SARS-CoV-2/FLU/RSV plus assay is intended as an aid in the diagnosis of influenza from Nasopharyngeal swab specimens and should not be used as a sole basis for treatment. Nasal washings and aspirates are unacceptable for Xpert Xpress SARS-CoV-2/FLU/RSV testing.  Fact Sheet for Patients: BloggerCourse.com  Fact Sheet for Healthcare Providers: SeriousBroker.it  This test is not yet approved or cleared by the [place] FDA and has been authorized for detection and/or diagnosis of SARS-CoV-2 by FDA under an Emergency Use Authorization (EUA). This EUA will remain in effect (meaning this test can be used) for the duration of the COVID-19 declaration under Section 564(b)(1) of the Act, 21 U.S.C. section 360bbb-3(b)(1), unless the authorization is terminated or revoked.     Resp Syncytial Virus by PCR NEGATIVE NEGATIVE Final    Comment: (NOTE) Fact Sheet for Patients: BloggerCourse.com  Fact Sheet for Healthcare Providers: SeriousBroker.it  This test is not yet approved or cleared by  the [employer] and has been authorized for detection and/or diagnosis of SARS-CoV-2 by FDA under an Emergency Use Authorization (EUA). This EUA will remain in effect (meaning this test can be used) for the duration of the COVID-19 declaration under Section 564(b)(1) of the Act, 21 U.S.C. section 360bbb-3(b)(1), unless the authorization is terminated or revoked.  Performed at [facility], [address] [place], [place] [address]   Culture, blood (routine x 2)     Status: None   Collection Time: 08/21/23 11:36 AM   Specimen: BLOOD RIGHT ARM  Result Value Ref Range Status   Specimen Description BLOOD RIGHT ARM  Final    Special Requests   Final    BOTTLES DRAWN AEROBIC AND ANAEROBIC Blood Culture results may not be optimal due to an inadequate volume of blood received in culture bottles   Culture   Final    NO GROWTH 5 DAYS Performed at [facility], [address] [place], [place] [address]    Report Status 08/26/2023 FINAL  Final  Culture, blood (routine x 2)     Status: None   Collection Time: 08/21/23 11:36 AM   Specimen: BLOOD LEFT ARM  Result Value Ref Range Status   Specimen Description BLOOD LEFT ARM  Final   Special Requests   Final    BOTTLES DRAWN AEROBIC AND ANAEROBIC Blood Culture results may not be optimal due to an inadequate volume of blood received in culture bottles   Culture   Final    NO GROWTH 5 DAYS Performed at [facility], [address] [place], [place] [address]    Report Status 08/26/2023 FINAL  Final  Respiratory (~20 pathogens) panel by PCR     Status: None   Collection Time: 08/23/23  4:54 AM   Specimen: Nasopharyngeal Swab; Respiratory  Result Value Ref Range Status   Adenovirus NOT DETECTED NOT DETECTED Final   Coronavirus 229E NOT DETECTED NOT DETECTED Final    Comment: (NOTE) The Coronavirus on the Respiratory Panel, DOES NOT test for the novel  Coronavirus (2019 nCoV)    Coronavirus HKU1 NOT DETECTED NOT DETECTED Final   Coronavirus NL63 NOT DETECTED NOT DETECTED Final   Coronavirus OC43 NOT DETECTED NOT DETECTED Final   Metapneumovirus NOT DETECTED NOT DETECTED Final   Rhinovirus / Enterovirus NOT DETECTED NOT DETECTED Final   Influenza A NOT DETECTED NOT DETECTED Final   Influenza B NOT DETECTED NOT DETECTED Final   Parainfluenza Virus 1 NOT DETECTED NOT DETECTED Final   Parainfluenza Virus 2 NOT DETECTED NOT DETECTED Final   Parainfluenza Virus 3 NOT DETECTED NOT DETECTED Final   Parainfluenza Virus 4 NOT DETECTED NOT DETECTED Final   Respiratory Syncytial Virus NOT DETECTED NOT DETECTED Final   Bordetella pertussis NOT DETECTED  NOT DETECTED Final   Bordetella Parapertussis NOT DETECTED NOT DETECTED Final   Chlamydophila pneumoniae NOT DETECTED NOT DETECTED Final   Mycoplasma pneumoniae NOT DETECTED NOT DETECTED Final    Comment: Performed at [facility], [address] [place], [place] [address]   Imaging DG Chest Portable 1 View Result Date: 08/21/2023 CLINICAL DATA:  Shortness of breath. Respiratory distress. History of COPD. EXAM: PORTABLE CHEST 1 VIEW COMPARISON:  07/02/2023. FINDINGS: Bilateral lungs appear hyperexpanded and hyperlucent with coarse bronchovascular markings, in keeping with COPD. There are superimposed diffuse alveolar and interstitial opacities overlying the left upper mid lung zone, upper portion of left lower lung zone and right upper lung zone, compatible with  multilobar pneumonia. Follow-up to clearing is recommended. Bilateral lateral costophrenic angles are clear. No large pleural effusion or pneumothorax. Normal cardio-mediastinal silhouette. No acute osseous abnormalities. The soft tissues are within normal limits. IMPRESSION: *Findings favor multilobar pneumonia, as described above. Follow-up to clearing is recommended. *Underlying COPD. Electronically Signed   By: Jules Schick M.D.   On: 08/21/2023 09:31      Time coordinating discharge: over 30 minutes  SIGNED:  Sunnie Nielsen DO [facility]

## 2023-08-29 NOTE — Plan of Care (Signed)
                                                     Palliative Care Progress Note   Patient Name: Taylor Chambers       Date: 08/29/2023 DOB: 1946/05/06  Age: 78 y.o. MRN#: [id number] Attending Physician: Sunnie Nielsen, DO Primary Care Physician: Mick Sell, MD Admit Date: 08/21/2023  Extensive chart review completed including labs, vital signs, imaging, progress notes, orders, and available advanced directive documents from current and previous encounters.   As per chart review, patient will have BiPAP delivered to home.  No acute palliative needs at this time.  Patient has PMT contact info and was encouraged to reach out with any acute palliative needs during this hospitalization.  Thank you for allowing the Palliative Medicine Team to assist in the care of Taylor Chambers.  Samara Deist L. Bonita Quin, DNP, FNP-BC Palliative Medicine Team  No charge

## 2023-08-30 ENCOUNTER — Telehealth: Payer: Self-pay | Admitting: Cardiovascular Disease

## 2023-08-30 MED ORDER — APIXABAN 5 MG PO TABS: 5.0000 mg | ORAL_TABLET | Freq: Two times a day (BID) | ORAL | 1 refills | Status: DC

## 2023-08-30 NOTE — Telephone Encounter (Signed)
 Pts daughter would like a c/b in regards to medication after 3pm. Please advise

## 2023-08-30 NOTE — Telephone Encounter (Signed)
 Pt c/o medication issue:  1. Name of Medication:   2. How are you currently taking this medication (dosage and times per day)?   3. Are you having a reaction (difficulty breathing--STAT)?   4. What is your medication issue? Pt has questions about medication list from hospital

## 2023-08-30 NOTE — Telephone Encounter (Signed)
 Spoke with daughter per DPR. Daughter reports that the patient was discharged from the hospital yesterday and she has questions about the discharge medication list. Daughter states that discharge medication list has Potassium 20 Meq daily and Losartan 100 MG daily. Daughter reports that they do not have a prescription for either medication. Daughter states that she is unsure if her mother was on Losartan prior to hospitalization and was concerned about her being on Losartan and Metoprolol. Recommended to daughter to monitor blood pressure and heart rates. Will forward to MD for review.

## 2023-09-01 NOTE — Telephone Encounter (Signed)
 Yes, that is fine.

## 2023-09-03 ENCOUNTER — Other Ambulatory Visit: Payer: Self-pay

## 2023-09-03 ENCOUNTER — Emergency Department
Admission: EM | Admit: 2023-09-03 | Discharge: 2023-09-06 | Disposition: A | Discharge status: Home / Self Care | Attending: Emergency Medicine | Admitting: Emergency Medicine

## 2023-09-03 ENCOUNTER — Emergency Department

## 2023-09-03 DIAGNOSIS — R443 Hallucinations, unspecified: Secondary | ICD-10-CM

## 2023-09-03 DIAGNOSIS — E876 Hypokalemia: Secondary | ICD-10-CM

## 2023-09-03 DIAGNOSIS — J449 Chronic obstructive pulmonary disease, unspecified: Secondary | ICD-10-CM | POA: Diagnosis not present

## 2023-09-03 DIAGNOSIS — Z87891 Personal history of nicotine dependence: Secondary | ICD-10-CM | POA: Diagnosis not present

## 2023-09-03 DIAGNOSIS — Z79899 Other long term (current) drug therapy: Secondary | ICD-10-CM | POA: Diagnosis not present

## 2023-09-03 DIAGNOSIS — I10 Essential (primary) hypertension: Secondary | ICD-10-CM | POA: Insufficient documentation

## 2023-09-03 DIAGNOSIS — R9431 Abnormal electrocardiogram [ECG] [EKG]: Secondary | ICD-10-CM | POA: Insufficient documentation

## 2023-09-03 DIAGNOSIS — R4182 Altered mental status, unspecified: Secondary | ICD-10-CM | POA: Insufficient documentation

## 2023-09-03 DIAGNOSIS — I4891 Unspecified atrial fibrillation: Secondary | ICD-10-CM | POA: Insufficient documentation

## 2023-09-03 DIAGNOSIS — G4700 Insomnia, unspecified: Secondary | ICD-10-CM | POA: Insufficient documentation

## 2023-09-03 DIAGNOSIS — Z7901 Long term (current) use of anticoagulants: Secondary | ICD-10-CM | POA: Insufficient documentation

## 2023-09-03 DIAGNOSIS — R638 Other symptoms and signs concerning food and fluid intake: Secondary | ICD-10-CM | POA: Insufficient documentation

## 2023-09-03 DIAGNOSIS — Z720 Tobacco use: Secondary | ICD-10-CM | POA: Insufficient documentation

## 2023-09-03 LAB — URINALYSIS, ROUTINE W REFLEX MICROSCOPIC
Bilirubin Urine: NEGATIVE
Glucose, UA: NEGATIVE mg/dL
Hgb urine dipstick: NEGATIVE
Ketones, ur: NEGATIVE mg/dL
Leukocytes,Ua: NEGATIVE
Nitrite: NEGATIVE
Protein, ur: NEGATIVE mg/dL
Specific Gravity, Urine: 1.019 (ref 1.005–1.030)
pH: 5 (ref 5.0–8.0)

## 2023-09-03 LAB — BASIC METABOLIC PANEL WITH GFR
Anion gap: 9 (ref 5–15)
BUN: 28 mg/dL — ABNORMAL HIGH (ref 8–23)
CO2: 31 mmol/L (ref 22–32)
Calcium: 8.7 mg/dL — ABNORMAL LOW (ref 8.9–10.3)
Chloride: 100 mmol/L (ref 98–111)
Creatinine, Ser: 0.94 mg/dL (ref 0.44–1.00)
GFR, Estimated: >60 mL/min (ref >60)
Glucose, Bld: 93 mg/dL (ref 70–99)
Potassium: 2.9 mmol/L — ABNORMAL LOW (ref 3.5–5.1)
Sodium: 140 mmol/L (ref 135–145)

## 2023-09-03 LAB — ETHANOL: Alcohol, Ethyl (B): <10 mg/dL (ref <10)

## 2023-09-03 LAB — CBC WITH DIFFERENTIAL/PLATELET
Abs Immature Granulocytes: 0.07 10*3/uL (ref 0.00–0.07)
Basophils Absolute: 0 10*3/uL (ref 0.0–0.1)
Basophils Relative: 0 %
Eosinophils Absolute: 0.4 10*3/uL (ref 0.0–0.5)
Eosinophils Relative: 3 %
HCT: 35.7 % — ABNORMAL LOW (ref 36.0–46.0)
Hemoglobin: 11.9 g/dL — ABNORMAL LOW (ref 12.0–15.0)
Immature Granulocytes: 1 %
Lymphocytes Relative: 10 %
Lymphs Abs: 1.2 10*3/uL (ref 0.7–4.0)
MCH: 34 pg (ref 26.0–34.0)
MCHC: 33.3 g/dL (ref 30.0–36.0)
MCV: 102 fL — ABNORMAL HIGH (ref 80.0–100.0)
Monocytes Absolute: 1.4 10*3/uL — ABNORMAL HIGH (ref 0.1–1.0)
Monocytes Relative: 11 %
Neutro Abs: 9.4 10*3/uL — ABNORMAL HIGH (ref 1.7–7.7)
Neutrophils Relative %: 75 %
Platelets: 236 10*3/uL (ref 150–400)
RBC: 3.5 MIL/uL — ABNORMAL LOW (ref 3.87–5.11)
RDW: 13.8 % (ref 11.5–15.5)
WBC: 12.5 10*3/uL — ABNORMAL HIGH (ref 4.0–10.5)
nRBC: 0 % (ref 0.0–0.2)

## 2023-09-03 LAB — URINE DRUG SCREEN, QUALITATIVE (ARMC ONLY)
Amphetamines, Ur Screen: NOT DETECTED
Barbiturates, Ur Screen: NOT DETECTED
Benzodiazepine, Ur Scrn: NOT DETECTED
Cannabinoid 50 Ng, Ur ~~LOC~~: NOT DETECTED
Cocaine Metabolite,Ur ~~LOC~~: NOT DETECTED
MDMA (Ecstasy)Ur Screen: NOT DETECTED
Methadone Scn, Ur: NOT DETECTED
Opiate, Ur Screen: NOT DETECTED
Phencyclidine (PCP) Ur S: NOT DETECTED
Tricyclic, Ur Screen: POSITIVE — AB

## 2023-09-03 MED ORDER — POTASSIUM CHLORIDE 20 MEQ PO PACK
40.0000 meq | PACK | Freq: Two times a day (BID) | ORAL | Status: DC
  Administered 2023-09-03 – 2023-09-04 (×3): 40 meq via ORAL
  Filled 2023-09-03 (×4): qty 2

## 2023-09-03 NOTE — ED Provider Notes (Signed)
 [facility] Provider Note    Event Date/Time   First MD Initiated Contact with Patient 09/03/23 1955     (approximate)   History   Altered Mental Status   HPI {Remember to add pertinent medical, surgical, social, and/or OB history to HPI:1} Susette Seminara is a 78 y.o. female  ***       Physical Exam   Triage Vital Signs: ED Triage Vitals [09/03/23 1950]  Encounter Vitals Group     BP 131/64     Systolic BP Percentile      Diastolic BP Percentile      Pulse Rate 65     Resp 20     Temp 99.2 F (37.3 C)     Temp Source Oral     SpO2 95 %     Weight      Height      Head Circumference      Peak Flow      Pain Score      Pain Loc      Pain Education      Exclude from Growth Chart     Most recent vital signs: Vitals:   09/03/23 1950  BP: 131/64  Pulse: 65  Resp: 20  Temp: 99.2 F (37.3 C)  SpO2: 95%    {Only need to document appropriate and relevant physical exam:1} General: Awake, no distress. *** CV:  Good peripheral perfusion. *** Resp:  Normal effort. *** Abd:  No distention. *** Other:  ***   ED Results / Procedures / Treatments   Labs (all labs ordered are listed, but only abnormal results are displayed) Labs Reviewed - No data to display   EKG  ***   RADIOLOGY *** {USE THE WORD "INTERPRETED"!! You MUST document your own interpretation of imaging, as well as the fact that you reviewed the radiologist's report!:1}   PROCEDURES:  Critical Care performed: Yes  CRITICAL CARE Performed by: Phineas Semen   Total critical care time: *** minutes  Critical care time was exclusive of separately billable procedures and treating other patients.  Critical care was necessary to treat or prevent imminent or life-threatening deterioration.  Critical care was time spent personally by me on the following activities: development of treatment plan with patient and/or surrogate as well as nursing, discussions  with consultants, evaluation of patient's response to treatment, examination of patient, obtaining history from patient or surrogate, ordering and performing treatments and interventions, ordering and review of laboratory studies, ordering and review of radiographic studies, pulse oximetry and re-evaluation of patient's condition.   Procedures    MEDICATIONS ORDERED IN ED: Medications - No data to display   IMPRESSION / MDM / ASSESSMENT AND PLAN / ED COURSE  I reviewed the triage vital signs and the nursing notes.                              Differential diagnosis includes, but is not limited to, ***  Patient's presentation is most consistent with {EM COPA:27473}   ***The patient is on the cardiac monitor to evaluate for evidence of arrhythmia and/or significant heart rate changes.  ***      FINAL CLINICAL IMPRESSION(S) / ED DIAGNOSES   Final diagnoses:  None     Rx / DC Orders   ED Discharge Orders     None        Note:  This document was prepared using Dragon  voice recognition software and may include unintentional dictation errors.

## 2023-09-03 NOTE — ED Provider Notes (Incomplete)
 [facility] Provider Note    Event Date/Time   First MD Initiated Contact with Patient 09/03/23 1955     (approximate)   History   Altered Mental Status   HPI  Taylor Chambers is a 78 y.o. female who presents to the emergency department today because of concerns for hallucinations.  The patient herself does not have any specific complaints.  History is primarily obtained from family at bedside.  They state that over the past few days they have noticed some hallucinations however today became a lot worse.  She has had hallucinations in the past which they state have been related to when she has been admitted for pneumonias.  They have not appreciated any cough or difficulty with breathing recently.  Have not appreciated any fevers or chest pain.     Physical Exam   Triage Vital Signs: ED Triage Vitals [09/03/23 1950]  Encounter Vitals Group     BP 131/64     Systolic BP Percentile      Diastolic BP Percentile      Pulse Rate 65     Resp 20     Temp 99.2 F (37.3 C)     Temp Source Oral     SpO2 95 %     Weight      Height      Head Circumference      Peak Flow      Pain Score      Pain Loc      Pain Education      Exclude from Growth Chart     Most recent vital signs: Vitals:   09/03/23 1950  BP: 131/64  Pulse: 65  Resp: 20  Temp: 99.2 F (37.3 C)  SpO2: 95%   General: Awake, alert, oriented. CV:  Good peripheral perfusion. Regular rate. Resp:  Normal effort. Lungs clear. Abd:  No distention.    ED Results / Procedures / Treatments   Labs (all labs ordered are listed, but only abnormal results are displayed) Labs Reviewed - No data to display   EKG  I, Phineas Semen, attending physician, personally viewed and interpreted this EKG  EKG Time: 1950 Rate: 63 Rhythm: sinus rhythm vs atrial fibrillation Axis: normal Intervals: qtc 604 QRS: narrow, q waves v1 ST changes: no st elevation Impression: significant  artifact.     RADIOLOGY I independently interpreted and visualized the CXR. My interpretation: *** Radiology interpretation: ***    PROCEDURES:  Critical Care performed: Yes  CRITICAL CARE Performed by: Phineas Semen   Total critical care time: *** minutes  Critical care time was exclusive of separately billable procedures and treating other patients.  Critical care was necessary to treat or prevent imminent or life-threatening deterioration.  Critical care was time spent personally by me on the following activities: development of treatment plan with patient and/or surrogate as well as nursing, discussions with consultants, evaluation of patient's response to treatment, examination of patient, obtaining history from patient or surrogate, ordering and performing treatments and interventions, ordering and review of laboratory studies, ordering and review of radiographic studies, pulse oximetry and re-evaluation of patient's condition.   Procedures    MEDICATIONS ORDERED IN ED: Medications - No data to display   IMPRESSION / MDM / ASSESSMENT AND PLAN / ED COURSE  I reviewed the triage vital signs and the nursing notes.  Differential diagnosis includes, but is not limited to, ***  Patient's presentation is most consistent with {EM COPA:27473}   ***The patient is on the cardiac monitor to evaluate for evidence of arrhythmia and/or significant heart rate changes.  ***      FINAL CLINICAL IMPRESSION(S) / ED DIAGNOSES   Final diagnoses:  None     Rx / DC Orders   ED Discharge Orders     None        Note:  This document was prepared using Dragon voice recognition software and may include unintentional dictation errors.

## 2023-09-03 NOTE — ED Triage Notes (Signed)
 BIBA for hallucinations. Pt has been saying she sees children running around her house. Pt lives at home with husband, denies CP, ABD pain, N/V/D, uti symptoms. Pt endorses sinus drainage.

## 2023-09-04 DIAGNOSIS — R4182 Altered mental status, unspecified: Secondary | ICD-10-CM

## 2023-09-04 DIAGNOSIS — R419 Unspecified symptoms and signs involving cognitive functions and awareness: Secondary | ICD-10-CM

## 2023-09-04 DIAGNOSIS — R443 Hallucinations, unspecified: Secondary | ICD-10-CM | POA: Diagnosis not present

## 2023-09-04 DIAGNOSIS — Z8659 Personal history of other mental and behavioral disorders: Secondary | ICD-10-CM

## 2023-09-04 LAB — MAGNESIUM: Magnesium: 2.4 mg/dL (ref 1.7–2.4)

## 2023-09-04 MED ORDER — POTASSIUM CHLORIDE CRYS ER 20 MEQ PO TBCR
20.0000 meq | EXTENDED_RELEASE_TABLET | Freq: Every day | ORAL | Status: DC
  Administered 2023-09-05: 20 meq via ORAL
  Filled 2023-09-04 (×2): qty 1

## 2023-09-04 MED ORDER — AMIODARONE HCL 200 MG PO TABS: 200.0000 mg | ORAL_TABLET | Freq: Every day | ORAL | Status: DC

## 2023-09-04 MED ORDER — APIXABAN 5 MG PO TABS
5.0000 mg | ORAL_TABLET | Freq: Two times a day (BID) | ORAL | Status: DC
  Administered 2023-09-04 – 2023-09-05 (×4): 5 mg via ORAL
  Filled 2023-09-04 (×4): qty 1

## 2023-09-04 MED ORDER — HALOPERIDOL 1 MG PO TABS
1.0000 mg | ORAL_TABLET | Freq: Once | ORAL | Status: DC
Start: 2023-09-04 — End: 2023-09-05
  Filled 2023-09-04: qty 1

## 2023-09-04 MED ORDER — ALBUTEROL SULFATE (2.5 MG/3ML) 0.083% IN NEBU: 2.5000 mg | INHALATION_SOLUTION | Freq: Four times a day (QID) | RESPIRATORY_TRACT | Status: DC | PRN

## 2023-09-04 MED ORDER — LOSARTAN POTASSIUM 50 MG PO TABS
100.0000 mg | ORAL_TABLET | Freq: Every day | ORAL | Status: DC
Start: 2023-09-04 — End: 2023-09-06
  Administered 2023-09-04 – 2023-09-05 (×2): 100 mg via ORAL
  Filled 2023-09-04 (×2): qty 2

## 2023-09-04 MED ORDER — FUROSEMIDE 40 MG PO TABS
40.0000 mg | ORAL_TABLET | Freq: Every day | ORAL | Status: DC
Start: 2023-09-04 — End: 2023-09-06
  Administered 2023-09-04 – 2023-09-05 (×2): 40 mg via ORAL
  Filled 2023-09-04 (×2): qty 1

## 2023-09-04 MED ORDER — PRAVASTATIN SODIUM 20 MG PO TABS
10.0000 mg | ORAL_TABLET | Freq: Every day | ORAL | Status: DC
  Administered 2023-09-04 – 2023-09-05 (×2): 10 mg via ORAL
  Filled 2023-09-04 (×2): qty 1

## 2023-09-04 MED ORDER — IPRATROPIUM BROMIDE 0.06 % NA SOLN
1.0000 | Freq: Every day | NASAL | Status: DC
Start: 2023-09-04 — End: 2023-09-06
  Administered 2023-09-05: 1 via NASAL
  Filled 2023-09-04: qty 15

## 2023-09-04 MED ORDER — PANTOPRAZOLE SODIUM 40 MG PO TBEC
40.0000 mg | DELAYED_RELEASE_TABLET | Freq: Every day | ORAL | Status: DC
  Administered 2023-09-04 – 2023-09-05 (×2): 40 mg via ORAL
  Filled 2023-09-04 (×2): qty 1

## 2023-09-04 MED ORDER — SERTRALINE HCL 50 MG PO TABS
100.0000 mg | ORAL_TABLET | Freq: Every day | ORAL | Status: DC
  Administered 2023-09-04 – 2023-09-05 (×2): 100 mg via ORAL
  Filled 2023-09-04 (×2): qty 2

## 2023-09-04 MED ORDER — IPRATROPIUM-ALBUTEROL 0.5-2.5 (3) MG/3ML IN SOLN
3.0000 mL | Freq: Two times a day (BID) | RESPIRATORY_TRACT | Status: DC
Start: 2023-09-04 — End: 2023-09-06
  Administered 2023-09-04 – 2023-09-05 (×3): 3 mL via RESPIRATORY_TRACT
  Filled 2023-09-04 (×4): qty 3

## 2023-09-04 MED ORDER — METOPROLOL SUCCINATE ER 25 MG PO TB24
25.0000 mg | ORAL_TABLET | Freq: Two times a day (BID) | ORAL | Status: DC
  Administered 2023-09-04 – 2023-09-05 (×4): 25 mg via ORAL
  Filled 2023-09-04 (×4): qty 1

## 2023-09-04 MED ORDER — AMIODARONE HCL 200 MG PO TABS
200.0000 mg | ORAL_TABLET | Freq: Two times a day (BID) | ORAL | Status: DC
  Administered 2023-09-04 – 2023-09-05 (×4): 200 mg via ORAL
  Filled 2023-09-04 (×4): qty 1

## 2023-09-04 NOTE — ED Notes (Signed)
 Family with pt  pt ate dinner meal.  Pt alert.  Pt on 2 liters oxygen [place].

## 2023-09-04 NOTE — BH Assessment (Signed)
 TTS spoke with IRIS via phone and secure chat to place Psych Consult.

## 2023-09-04 NOTE — ED Notes (Signed)
 Family with pt

## 2023-09-04 NOTE — ED Provider Notes (Signed)
.-----------------------------------------   11:11 PM on 09/04/2023 -----------------------------------------  Blood pressure 123/78, pulse 70, temperature 98.2 F (36.8 C), temperature source Oral, resp. rate 18, SpO2 98%.  Psych had written that she is medically cleared, recommended starting quetiapine on her.  However her QTc is prolonged.  Reach back out to psych to have her reevaluated and to make additional recommendations for patient given her QTc.  Will continue to monitor her pending psych disposition and recommendations.        Claybon Jabs, MD 09/04/23 [phone]

## 2023-09-04 NOTE — Telephone Encounter (Signed)
 Called and left a message for call back

## 2023-09-04 NOTE — ED Provider Notes (Signed)
 Emergency Medicine Observation Re-evaluation Note  Taylor Chambers is a 78 y.o. female, seen on rounds today.  Pt initially presented to the ED for complaints of Altered Mental Status  Currently, the patient is is no acute distress. Denies any concerns at this time.  Physical Exam  Blood pressure (!) 158/71, pulse (!) 48, temperature 99.2 F (37.3 C), temperature source Oral, resp. rate 17, SpO2 96%.  Physical Exam: General: No apparent distress Pulm: Normal WOB Neuro: Moving all extremities Psych: Resting comfortably     ED Course / MDM     I have reviewed the labs performed to date as well as medications administered while in observation.  Recent changes in the last 24 hours include: No acute events overnight.  Plan   Current plan: Patient awaiting psychiatric disposition. Patient is not under full IVC at this time.    Taylor Chambers, Layla Maw, DO 09/04/23 [phone]

## 2023-09-04 NOTE — ED Notes (Signed)
VOL  CONSULT  DONE   

## 2023-09-04 NOTE — ED Notes (Signed)
 Patient on screen with psych at this time.

## 2023-09-04 NOTE — BH Assessment (Signed)
 Comprehensive Clinical Assessment (CCA) Note  09/04/2023 Taylor Chambers [id number]  Chief Complaint:  Chief Complaint  Patient presents with   Altered Mental Status   Visit Diagnosis: Altered Mental Status   Taylor Chambers is a 78 year old female who presents to the ER due to having AV/H. She admits that she has been told by her husband this is taking place but she believes what she is experiencing is real. Per the report of the husband, her memory and behaviors have changed within the last eight months. However, for seven to eight weeks, she has had the hallucinations. Within the same timeframe, her sleep had decreased to approximately two hours, for every forty-eight hours. Her appetite has decreased and now she is too weak to do much. He has to take her to the bathroom and help her complete her ADL's. Today is the first time he has ever heard her cuss, which caused him to bring her to the ER to get checkout. During the interview, the patient was calm, cooperative and pleasant. She was able to answer majority of the questions with appropriate answers. However, after talking with the patient, the husband shared, how she's currently presenting isn't how she is at home.  CCA Screening, Triage and Referral (STR)  Patient Reported Information How did you hear about [place]? Family/Friend  What Is the Reason for Your Visit/Call Today? Patient's husband has concerns about her current mental state.  How Long Has This Been Causing You Problems? 1-6 months  What Do You Feel Would Help You the Most Today? Treatment for Depression or other mood problem   Have You Recently Had Any Thoughts About Hurting Yourself? No  Are You Planning to Commit Suicide/Harm Yourself At This time? No   Flowsheet Row ED from 09/03/2023 in [facility] Emergency Department at [facility] ED to Hosp-Admission (Discharged) from 08/21/2023 in [facility] ED to Hosp-Admission (Discharged) from  06/29/2023 in [facility]  C-SSRS RISK CATEGORY No Risk No Risk No Risk       Have you Recently Had Thoughts About Hurting Someone Karolee Ohs? No  Are You Planning to Harm Someone at This Time? No  Explanation: No data recorded  Have You Used Any Alcohol or Drugs in the Past 24 Hours? No  How Long Ago Did You Use Drugs or Alcohol? No data recorded What Did You Use and How Much? No data recorded  Do You Currently Have a Therapist/Psychiatrist? No  Name of Therapist/Psychiatrist:    Have You Been Recently Discharged From Any Office Practice or Programs? No  Explanation of Discharge From Practice/Program: No data recorded    CCA Screening Triage Referral Assessment Type of Contact: Face-to-Face  Telemedicine Service Delivery:   Is this Initial or Reassessment?   Date Telepsych consult ordered in [facility]:    Time Telepsych consult ordered in [facility]:    Location of Assessment: [facility] ED  Provider Location: [facility] ED   Collateral Involvement: No data recorded  Does Patient Have a Court Appointed Legal Guardian? No  Legal Guardian Contact Information: No data recorded Copy of Legal Guardianship Form: No data recorded Legal Guardian Notified of Arrival: No data recorded Legal Guardian Notified of Pending Discharge: No data recorded If Minor and Not Living with Parent(s), Who has Custody? No data recorded Is CPS involved or ever been involved? Never  Is APS involved or ever been involved? Never   Patient Determined To Be At Risk for Harm To Self or Others  Based on Review of Patient Reported Information or Presenting Complaint? No  Method: No data recorded Availability of Means: No data recorded Intent: No data recorded Notification Required: No data recorded Additional Information for Danger to Others Potential: Active psychosis  Additional Comments for Danger to Others Potential: No data recorded Are There Guns or Other Weapons in Your Home? No data  recorded Types of Guns/Weapons: No data recorded Are These Weapons Safely Secured?                            No data recorded Who Could Verify You Are Able To Have These Secured: No data recorded Do You Have any Outstanding Charges, Pending Court Dates, Parole/Probation? No data recorded Contacted To Inform of Risk of Harm To Self or Others: No data recorded   Does Patient Present under Involuntary Commitment? No  [place] of Residence: [place]   Patient Currently Receiving the Following Services: Not Receiving Services   Determination of Need: Emergent (2 hours)   Options For Referral: ED Visit   CCA Biopsychosocial Patient Reported Schizophrenia/Schizoaffective Diagnosis in Past: No   Strengths: Have support system, stable housing and polite.   Mental Health Symptoms Depression:  Change in energy/activity; Sleep (too much or little)   Duration of Depressive symptoms: Duration of Depressive Symptoms: Greater than two weeks   Mania:  None   Anxiety:   N/A   Psychosis:  Hallucinations   Duration of Psychotic symptoms: Duration of Psychotic Symptoms: Less than six months   Trauma:  N/A   Obsessions:  N/A   Compulsions:  N/A   Inattention:  N/A   Hyperactivity/Impulsivity:  N/A   Oppositional/Defiant Behaviors:  N/A   Emotional Irregularity:  N/A   Other Mood/Personality Symptoms:  No data recorded   Mental Status Exam Appearance and self-care  Stature:  Average   Weight:  Average weight   Clothing:  Neat/clean; Age-appropriate   Grooming:  Normal   Cosmetic use:  None   Posture/gait:  Other (Comment)   Motor activity:  Slowed   Sensorium  Attention:  Distractible   Concentration:  Focuses on irrelevancies   Orientation:  Person; Place; Time   Recall/memory:  Defective in Short-term   Affect and Mood  Affect:  Appropriate   Mood:  Other (Comment)   Relating  Eye contact:  Normal   Facial expression:  Responsive   Attitude  toward examiner:  Cooperative   Thought and Language  Speech flow: Normal   Thought content:  Appropriate to Mood and Circumstances   Preoccupation:  Other (Comment)   Hallucinations:  Visual; Auditory (Per husband and patient)   Organization:  Engineer, site of Knowledge:  Fair   Intelligence:  Average   Abstraction:  Functional   Judgement:  Poor   Reality Testing:  Adequate   Insight:  Denial   Decision Making:  Confused   Social Functioning  Social Maturity:  Isolates   Social Judgement:  Heedless   Stress  Stressors:  Transitions; Illness   Coping Ability:  Overwhelmed   Skill Deficits:  Activities of daily living; Decision making; Self-care   Supports:  Family     Religion: Religion/Spirituality Are You A Religious Person?: No  Leisure/Recreation: Leisure / Recreation Do You Have Hobbies?: No  Exercise/Diet: Exercise/Diet Do You Exercise?: No Have You Gained or Lost A Significant Amount of Weight in the Past Six Months?: No Do You Follow a  Special Diet?: No Do You Have Any Trouble Sleeping?: Yes Explanation of Sleeping Difficulties: Husband reports she sleeps one to two hours within the 48-hour spans.   CCA Employment/Education Employment/Work Situation: Employment / Work Situation Employment Situation: Retired Passenger transport manager has Been Impacted by Current Illness: No  Education: Education Is Patient Currently Attending School?: No Did You Have An Individualized Education Program (IIEP): No Did You Have Any Difficulty At [employer]?: No Patient's Education Has Been Impacted by Current Illness: No   CCA Family/Childhood History Family and Relationship History: Family history Marital status: Married  Childhood History:  Childhood History By whom was/is the patient raised?: Both parents Did patient suffer any verbal/emotional/physical/sexual abuse as a child?: No Did patient suffer from severe childhood  neglect?: No Has patient ever been sexually abused/assaulted/raped as an adolescent or adult?: No Was the patient ever a victim of a crime or a disaster?: No Witnessed domestic violence?: No Has patient been affected by domestic violence as an adult?: No   CCA Substance Use Alcohol/Drug Use: Alcohol / Drug Use Pain Medications: See MAR Prescriptions: See MAR Over the Counter: See MAR History of alcohol / drug use?: No history of alcohol / drug abuse Longest period of sobriety (when/how long): n/a    ASAM's:  Six Dimensions of Multidimensional Assessment  Dimension 1:  Acute Intoxication and/or Withdrawal Potential:      Dimension 2:  Biomedical Conditions and Complications:      Dimension 3:  Emotional, Behavioral, or Cognitive Conditions and Complications:     Dimension 4:  Readiness to Change:     Dimension 5:  Relapse, Continued use, or Continued Problem Potential:     Dimension 6:  Recovery/Living Environment:     ASAM Severity Score:    ASAM Recommended Level of Treatment:     Substance use Disorder (SUD)    Recommendations for Services/Supports/Treatments:    Disposition Recommendation per psychiatric provider: Pending Psych Consult   DSM5 Diagnoses: Patient Active Problem List   Diagnosis Date Noted   Acute on chronic heart failure with preserved ejection fraction (HFpEF) (HCC) 08/24/2023   Multifocal pneumonia 08/23/2023   Severe sepsis with acute organ dysfunction (HCC) 08/21/2023   Hyperlipidemia 08/21/2023   Depression 08/21/2023   Acute on chronic respiratory failure with hypoxia and hypercapnia (HCC) 06/29/2023   COPD (chronic obstructive pulmonary disease) (HCC) 06/25/2023   Hyperglycemia 06/24/2023   Severe mitral valve regurgitation 04/21/2023   SOB (shortness of breath) 04/21/2023   Atrial fibrillation with RVR (HCC) 09/06/2022   Paroxysmal atrial fibrillation with RVR (HCC) 09/05/2022   Leukocytosis 09/05/2022   Macrocytosis 09/05/2022   Lung  nodule 01/12/2022   Myocardial injury 01/12/2022   Protein-calorie malnutrition, severe (HCC) 01/12/2022   COPD exacerbation (HCC) 01/11/2022   Sepsis (HCC) 01/11/2022   Acute hypercapnic respiratory failure (HCC) 01/11/2022   Hx of total hip arthroplasty, 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 11/10/2021   H/O heartburn 06/24/2021   Centrilobular emphysema (HCC) 02/05/2019   Tobacco use disorder 10/07/2013   Benign essential hypertension 10/07/2013   Symptomatic menopausal or female climacteric states 10/07/2013     Referrals to Alternative Service(s): Referred to Alternative Service(s):   Place:   Date:   Time:    Referred to Alternative Service(s):   Place:   Date:   Time:    Referred to Alternative Service(s):   Place:   Date:   Time:    Referred to Alternative Service(s):   Place:   Date:   Time:     Jerilynn Som  Meredith Pel MS, LCAS, [facility], NCC Therapeutic Triage Specialist 09/04/2023 1:03 AM

## 2023-09-04 NOTE — ED Notes (Signed)
 Pt changed into paper scrubs. Belongings given to husband

## 2023-09-04 NOTE — Consult Note (Signed)
 [facility] Consult Note  Patient Name: Taylor Chambers MRN: [id number] DOB: 08-25-1945 DATE OF Consult: 09/04/2023  PRIMARY PSYCHIATRIC DIAGNOSES  1.  Altered mental status, r/o delirium 2.  Insomnia, decreased oral intake 3.  Neurocognitive disorder 4. Bipolar disorder by history  RECOMMENDATIONS  Recommendations: Medication recommendations: quetiapine 25 mg PO nightly, with additional 25 mg PO PRN for insomnia/anxiety; I reviewed R/B/A   Non-Medication/therapeutic recommendations: outpatient med management resources  Is inpatient psychiatric hospitalization recommended for this patient? No (Explain why): no immediate danger to self or others at this time   Follow-Up Telepsychiatry C/L services: We will sign off for now. Please re-consult our service if needed for any concerning changes in the patient's condition, discharge planning, or questions.  Communication: Treatment team members (and family members if applicable) who were involved in treatment/care discussions and planning, and with whom we spoke or engaged with via secure text/chat, include the following: EDRN; pt's husband  Thank you for involving [place] in the care of this patient. If you have any additional questions or concerns, please call [phone] and ask for me or the provider on-call.  TELEPSYCHIATRY ATTESTATION & CONSENT  As the provider for this telehealth consult, I attest that I verified the patient's identity using two separate identifiers, introduced myself to the patient, provided my credentials, disclosed my location, and performed this encounter via a HIPAA-compliant, real-time, face-to-face, two-way, interactive audio and video platform and with the full consent and agreement of the patient (or guardian as applicable.)  Patient physical location: ED at [facility] Telehealth provider physical location: home office in state of [place].  Video start time: 1008a CST (Central Time) Video end  time: 1020a CST (Central Time)  IDENTIFYING DATA  Taylor Chambers is a 78 y.o. year-old female for whom a psychiatric consultation has been ordered by the primary provider. The patient was identified using two separate identifiers.  CHIEF COMPLAINT/REASON FOR CONSULT  Hallucinations  HISTORY OF PRESENT ILLNESS (HPI)  The patient is a 78 year old woman presenting with 1-2 month history of intermittent visual hallucinations in setting of a reported 39-month history of memory and behavior changes; Psychiatry consulted given above.  Patient seen accompanied by her husband, with whom she resides. She is oriented to self, place, date. She reports feeling "a lot better than [she] was" although demonstrates tangential thought process with frequent need for redirection; she describes her AVH as "dreams she can remember" though also reportedly having both decreased oral intake and decreased sleep for weeks per husband's report - patient had AVH prior when ill with pneumonia in hospital. Patient adamantly denies any SI, HI - no history of SA, SIB, or known Psych.hospitalizations; patient does take sertraline 100 mg PO- patient reports she became more depressed when acutely ill from respiratory illness a month or two ago. Husband also endorsed patient with weakness walking. Denies any recent steroid treatment.  Impression of neurocognitive disorder, potentially with acute delirium though no clearly evident precipitants, although a diminished cognitive reserve along with reduced PO intake and insomnia could itself add deliriogenic burden. I don't believe patient requires Psychiatric admission; I did discuss and recommend starting a mood stabilizer (ie, antipsychotic) such as quetiapine to treat hallucinations, mood, sleep, appetite. Patient and husband amenable.   PAST PSYCHIATRIC HISTORY   Zoloft for depression No history of SIB, SA, IPH Chart h/o bipolar d/o   Otherwise as per HPI above.  PAST MEDICAL  HISTORY  Past Medical History:  Diagnosis Date   Allergies  Anxiety    Aortic atherosclerosis (HCC)    Bipolar affective disorder (HCC)    COPD (chronic obstructive pulmonary disease) (HCC)    Coronary artery calcification seen on CT scan    Depression    Diastolic dysfunction 02/24/2020   a.) TTE 02/24/2020: EF >55%; triv PR, mild TR, mod MR; G1DD.   DOE (dyspnea on exertion)    Emphysema lung (HCC)    History of cataract    HLD (hyperlipidemia)    Hypertension    Macular degeneration    Migraines    Osteoporosis    Pneumonia    Tobacco use      HOME MEDICATIONS  Facility Ordered Medications  Medication   potassium chloride (KLOR-CON) packet 40 mEq   albuterol (PROVENTIL) (2.5 MG/3ML) 0.083% nebulizer solution 2.5 mg   amiodarone (PACERONE) tablet 200 mg   Followed by   Melene Muller ON 09/09/2023] amiodarone (PACERONE) tablet 200 mg   apixaban (ELIQUIS) tablet 5 mg   furosemide (LASIX) tablet 40 mg   ipratropium (ATROVENT) 0.06 % nasal spray 1 spray   ipratropium-albuterol (DUONEB) 0.5-2.5 (3) MG/3ML nebulizer solution 3 mL   losartan (COZAAR) tablet 100 mg   metoprolol succinate (TOPROL-XL) 24 hr tablet 25 mg   potassium chloride SA (KLOR-CON M) CR tablet 20 mEq   pantoprazole (PROTONIX) EC tablet 40 mg   pravastatin (PRAVACHOL) tablet 10 mg   sertraline (ZOLOFT) tablet 100 mg   PTA Medications  Medication Sig   Calcium Carbonate (CALCIUM 500 PO) Take 500 mg by mouth daily at 12 noon.   Cholecalciferol (VITAMIN D3) 1.25 MG (50000 UT) CAPS Take 5,000 Units by mouth daily in the afternoon.   pravastatin (PRAVACHOL) 10 MG tablet Take 10 mg by mouth daily.   Multiple Vitamins-Minerals (PRESERVISION AREDS 2+MULTI VIT PO) Take 2 tablets by mouth daily.   ipratropium-albuterol (DUONEB) 0.5-2.5 (3) MG/3ML SOLN Take 3 mLs by nebulization 2 (two) times daily. Mix with budesonide   albuterol (VENTOLIN HFA) 108 (90 Base) MCG/ACT inhaler Inhale 2 puffs into the lungs every 6 (six)  hours as needed for wheezing or shortness of breath.   Dupilumab 300 MG/2ML SOAJ Inject 300 mg into the skin every 14 (fourteen) days. Saturday   EPINEPHrine 0.3 mg/0.3 mL IJ SOAJ injection Inject 0.3 mg into the muscle as needed for anaphylaxis.   OHTUVAYRE 3 MG/2.5ML SUSP Take 3 mg by nebulization daily.   sertraline (ZOLOFT) 100 MG tablet Take 1 tablet (100 mg total) by mouth daily.   ipratropium (ATROVENT) 0.06 % nasal spray Place into the nose.   losartan (COZAAR) 100 MG tablet Take by mouth.   Multiple Vitamin (MULTI-VITAMIN) tablet Take 1 tablet by mouth daily.   potassium chloride SA (KLOR-CON M) 20 MEQ tablet Take 20 mEq by mouth daily.   furosemide (LASIX) 40 MG tablet Take 1 tablet (40 mg total) by mouth daily.   metoprolol succinate (TOPROL-XL) 25 MG 24 hr tablet Take 1 tablet (25 mg total) by mouth 2 (two) times daily.   amiodarone (PACERONE) 200 MG tablet Take 2 tablets (400 mg total) by mouth 2 (two) times daily for 4 days, THEN 1 tablet (200 mg total) 2 (two) times daily for 7 days, THEN 1 tablet (200 mg total) daily.   apixaban (ELIQUIS) 5 MG TABS tablet Take 1 tablet (5 mg total) by mouth 2 (two) times daily.     ALLERGIES  Allergies  Allergen Reactions   Ativan [Lorazepam] Other (See Comments)    Restlessness  Bupropion Other (See Comments)    Foggy brain    SOCIAL & SUBSTANCE USE HISTORY  Social History   Socioeconomic History   Marital status: Married    Spouse name: Dorene Sorrow   Number of children: Not on file   Years of education: Not on file   Highest education level: Not on file  Occupational History   Not on file  Tobacco Use   Smoking status: Former    Current packs/day: 0.00    Average packs/day: 0.3 packs/day for 50.0 years (12.5 ttl pk-yrs)    Types: Cigarettes    Start date: 01/10/1972    Quit date: 01/09/2022    Years since quitting: 1.6   Smokeless tobacco: Never  Vaping Use   Vaping status: Never Used  Substance and Sexual Activity   Alcohol  use: Never   Drug use: Never   Sexual activity: Not Currently  Other Topics Concern   Not on file  Social History Narrative   Not on file   Social Drivers of Health   Financial Resource Strain: Low Risk  (07/06/2023)   Received from [facility]   Overall Financial Resource Strain (CARDIA)    Difficulty of Paying Living Expenses: Not hard at all  Food Insecurity: No Food Insecurity (08/23/2023)   Hunger Vital Sign    Worried About Running Out of Food in the Last Year: Never true    Ran Out of Food in the Last Year: Never true  Transportation Needs: No Transportation Needs (08/23/2023)   PRAPARE - Administrator, Civil Service (Medical): No    Lack of Transportation (Non-Medical): No  Physical Activity: Not on file  Stress: Not on file  Social Connections: Moderately Isolated (08/23/2023)   Social Connection and Isolation Panel [NHANES]    Frequency of Communication with Friends and Family: Three times a week    Frequency of Social Gatherings with Friends and Family: More than three times a week    Attends Religious Services: Never    Database administrator or Organizations: No    Attends Banker Meetings: Never    Marital Status: Married   Social History   Tobacco Use  Smoking Status Former   Current packs/day: 0.00   Average packs/day: 0.3 packs/day for 50.0 years (12.5 ttl pk-yrs)   Types: Cigarettes   Start date: 01/10/1972   Quit date: 01/09/2022   Years since quitting: 1.6  Smokeless Tobacco Never   Social History   Substance and Sexual Activity  Alcohol Use Never   Social History   Substance and Sexual Activity  Drug Use Never    Additional pertinent information: Denies recent substance use.  FAMILY HISTORY  Family History  Problem Relation Age of Onset   Breast cancer Mother 75   Heart Problems Maternal Uncle    Heart Problems Paternal Aunt    Heart Problems Paternal Uncle    Family Psychiatric History (if  known):  none reported  MENTAL STATUS EXAM (MSE)  Mental Status Exam: General Appearance: hospital attire, in no acute distress  Orientation:  Full (Time, Place, and Person)  Memory:  Immediate;   Fair Remote;   Poor  Concentration:  distractible  Recall:  limited  Attention  fair at times  Eye Contact:  fair  Speech:  normal rate, rhythm, volume  Language:  fluent English  Volume:  wnl  Mood: "better"  Affect:  full range  Thought Process:  tangential  Thought Content:  AVH ("  dreams I can remember")  Suicidal Thoughts:  no  Homicidal Thoughts:  no  Judgement:  fair  Insight:  limited  Psychomotor Activity:  wnl  Akathisia:  none evident  Fund of Knowledge:  fair              VITALS  Blood pressure 134/73, pulse 71, temperature 98.9 F (37.2 C), temperature source Oral, resp. rate 17, SpO2 100%.  LABS  Admission on 09/03/2023  Component Date Value Ref Range Status   Color, Urine 09/03/2023 YELLOW (A)  YELLOW Final   APPearance 09/03/2023 CLEAR (A)  CLEAR Final   Specific Gravity, Urine 09/03/2023 1.019  1.005 - 1.030 Final   pH 09/03/2023 5.0  5.0 - 8.0 Final   Glucose, UA 09/03/2023 NEGATIVE  NEGATIVE mg/dL Final   Hgb urine dipstick 09/03/2023 NEGATIVE  NEGATIVE Final   Bilirubin Urine 09/03/2023 NEGATIVE  NEGATIVE Final   Ketones, ur 09/03/2023 NEGATIVE  NEGATIVE mg/dL Final   Protein, ur 16/03/9603 NEGATIVE  NEGATIVE mg/dL Final   Nitrite 54/02/8118 NEGATIVE  NEGATIVE Final   Leukocytes,Ua 09/03/2023 NEGATIVE  NEGATIVE Final   Performed at [facility], [address] [place], [place] [address]   Alcohol, Ethyl (B) 09/03/2023 <10  <10 mg/dL Final   Comment: (NOTE) Lowest detectable limit for serum alcohol is 10 mg/dL.  For medical purposes only. Performed at [facility], [address] [place], [place] [address]    Tricyclic, Ur Screen 09/03/2023 POSITIVE (A)  NONE DETECTED Final   Amphetamines, Ur Screen 09/03/2023 NONE DETECTED   NONE DETECTED Final   MDMA (Ecstasy)Ur Screen 09/03/2023 NONE DETECTED  NONE DETECTED Final   Cocaine Metabolite,Ur [place] 09/03/2023 NONE DETECTED  NONE DETECTED Final   Opiate, Ur Screen 09/03/2023 NONE DETECTED  NONE DETECTED Final   Phencyclidine (PCP) Ur S 09/03/2023 NONE DETECTED  NONE DETECTED Final   Cannabinoid 50 Ng, Ur [place] 09/03/2023 NONE DETECTED  NONE DETECTED Final   Barbiturates, Ur Screen 09/03/2023 NONE DETECTED  NONE DETECTED Final   Benzodiazepine, Ur Scrn 09/03/2023 NONE DETECTED  NONE DETECTED Final   Methadone Scn, Ur 09/03/2023 NONE DETECTED  NONE DETECTED Final   Comment: (NOTE) Tricyclics + metabolites, urine    Cutoff 1000 ng/mL Amphetamines + metabolites, urine  Cutoff 1000 ng/mL MDMA (Ecstasy), urine              Cutoff 500 ng/mL Cocaine Metabolite, urine          Cutoff 300 ng/mL Opiate + metabolites, urine        Cutoff 300 ng/mL Phencyclidine (PCP), urine         Cutoff 25 ng/mL Cannabinoid, urine                 Cutoff 50 ng/mL Barbiturates + metabolites, urine  Cutoff 200 ng/mL Benzodiazepine, urine              Cutoff 200 ng/mL Methadone, urine                   Cutoff 300 ng/mL  The urine drug screen provides only a preliminary, unconfirmed analytical test result and should not be used for non-medical purposes. Clinical consideration and professional judgment should be applied to any positive drug screen result due to possible interfering substances. A more specific alternate chemical method must be used in order to obtain a confirmed analytical result. Gas chromatography / mass spectrometry (GC/MS) is the preferred confirm  atory method. Performed at [facility], [address] [place], [place] [address]    Sodium 09/03/2023 140  135 - 145 mmol/L Final   Potassium 09/03/2023 2.9 (L)  3.5 - 5.1 mmol/L Final   Chloride 09/03/2023 100  98 - 111 mmol/L Final   CO2 09/03/2023 31  22 - 32 mmol/L Final   Glucose, Bld  09/03/2023 93  70 - 99 mg/dL Final   Glucose reference range applies only to samples taken after fasting for at least 8 hours.   BUN 09/03/2023 28 (H)  8 - 23 mg/dL Final   Creatinine, Ser 09/03/2023 0.94  0.44 - 1.00 mg/dL Final   Calcium 95/28/4132 8.7 (L)  8.9 - 10.3 mg/dL Final   GFR, Estimated 09/03/2023 >60  >60 mL/min Final   Comment: (NOTE) Calculated using the CKD-EPI Creatinine Equation (2021)    Anion gap 09/03/2023 9  5 - 15 Final   Performed at [facility], [address] [place], [place] [address]   WBC 09/03/2023 12.5 (H)  4.0 - 10.5 K/uL Final   RBC 09/03/2023 3.50 (L)  3.87 - 5.11 MIL/uL Final   Hemoglobin 09/03/2023 11.9 (L)  12.0 - 15.0 g/dL Final   HCT 27/25/3664 35.7 (L)  36.0 - 46.0 % Final   MCV 09/03/2023 102.0 (H)  80.0 - 100.0 fL Final   MCH 09/03/2023 34.0  26.0 - 34.0 pg Final   MCHC 09/03/2023 33.3  30.0 - 36.0 g/dL Final   RDW 40/34/7425 13.8  11.5 - 15.5 % Final   Platelets 09/03/2023 236  150 - 400 K/uL Final   nRBC 09/03/2023 0.0  0.0 - 0.2 % Final   Neutrophils Relative % 09/03/2023 75  % Final   Neutro Abs 09/03/2023 9.4 (H)  1.7 - 7.7 K/uL Final   Lymphocytes Relative 09/03/2023 10  % Final   Lymphs Abs 09/03/2023 1.2  0.7 - 4.0 K/uL Final   Monocytes Relative 09/03/2023 11  % Final   Monocytes Absolute 09/03/2023 1.4 (H)  0.1 - 1.0 K/uL Final   Eosinophils Relative 09/03/2023 3  % Final   Eosinophils Absolute 09/03/2023 0.4  0.0 - 0.5 K/uL Final   Basophils Relative 09/03/2023 0  % Final   Basophils Absolute 09/03/2023 0.0  0.0 - 0.1 K/uL Final   Immature Granulocytes 09/03/2023 1  % Final   Abs Immature Granulocytes 09/03/2023 0.07  0.00 - 0.07 K/uL Final   Performed at [facility], [address] [place], [place] [address]   Magnesium 09/03/2023 2.4  1.7 - 2.4 mg/dL Final   Performed at [facility], [address] [place], [place] [address]    PSYCHIATRIC REVIEW OF SYSTEMS (ROS)  ROS: Notable for the  following relevant positive findings: ROS  Additional findings:      Musculoskeletal: No abnormal movements observed      Gait & Station: Laying/Sitting      Pain Screening: Denies      Nutrition & Dental Concerns: Decrease in food intake and/or loss of appetite  RISK FORMULATION/ASSESSMENT  Is the patient experiencing any suicidal or homicidal ideations: No  Protective factors considered for safety management: husband; denial of SI  Risk factors/concerns considered for safety management:  Depression Physical illness/chronic pain Age over 10  Is there a safety management plan with the patient and treatment team to minimize risk factors and promote protective factors: Yes           Explain: meds, OP follow-up Is  crisis care placement or psychiatric hospitalization recommended: No     Based on my current evaluation and risk assessment, patient is determined at this time to be at:  Low risk  *RISK ASSESSMENT Risk assessment is a dynamic process; it is possible that this patient's condition, and risk level, may change. This should be re-evaluated and managed over time as appropriate. Please re-consult psychiatric consult services if additional assistance is needed in terms of risk assessment and management. If your team decides to discharge this patient, please advise the patient how to best access emergency psychiatric services, or to call 911, if their condition worsens or they feel unsafe in any way.   Londell Moh, MD Telepsychiatry Consult Services

## 2023-09-05 MED ORDER — POTASSIUM CHLORIDE CRYS ER 20 MEQ PO TBCR
40.0000 meq | EXTENDED_RELEASE_TABLET | Freq: Once | ORAL | Status: AC
Start: 2023-09-05 — End: 2023-09-05
  Administered 2023-09-05: 40 meq via ORAL
  Filled 2023-09-05: qty 2

## 2023-09-05 MED ORDER — DIVALPROEX SODIUM 250 MG PO DR TAB
250.0000 mg | DELAYED_RELEASE_TABLET | Freq: Two times a day (BID) | ORAL | Status: DC
  Administered 2023-09-05 (×2): 250 mg via ORAL
  Filled 2023-09-05 (×2): qty 1

## 2023-09-05 NOTE — Telephone Encounter (Signed)
 Called to speak with patient. Per family patient is currently in the Emergency Department.

## 2023-09-05 NOTE — ED Notes (Signed)
 Pt assisted to bathroom with wheelchair by this RN and EDT. Pt back to bed. Pt sat up to brush teeth. Pt independently brushing teeth.

## 2023-09-05 NOTE — ED Notes (Signed)
 Family with pt

## 2023-09-05 NOTE — ED Notes (Signed)
 Resumed care from logan rn.  Pt alert, pt on 2 liters oxygen Erda.  Family with pt   meds given.  Siderails up x 2   pt calm and cooperative.

## 2023-09-05 NOTE — Consult Note (Signed)
 She was previously psych cleared by [facility] on March 31st.   and Seroquel was recommended for her.  Unfortunately, patient's QTC is prolonged at 526, which is above parameters for Seroquel. The ED provider is requesting psychiatric recommendations for medication alternatives.    Per chart review, LFTs are within normal limits, recommend she start Depakote 250mg  po BID.  She can continue follow up with her outpatient provider as she will need one month valproic acid labs.   Above discussed in secure chat with care team Dr. Herma Mering and Elesa Hacker, RN.

## 2023-09-05 NOTE — ED Provider Notes (Signed)
 Emergency Medicine Observation Re-evaluation Note  Taylor Chambers is a 78 y.o. female, seen on rounds today.  Pt initially presented to the ED for complaints of Altered Mental Status Currently, the patient is resting, voices no medical complaints.  Physical Exam  BP 135/65 (BP Location: Left Arm)   Pulse 84   Temp 98 F (36.7 C) (Oral)   Resp 16   SpO2 99%  Physical Exam General: Resting in no acute distress Cardiac: No cyanosis Lungs: Equal rise and fall Psych: Not agitated  ED Course / MDM  EKG:EKG Interpretation Date/Time:  Sunday September 03 2023 19:50:33 EDT Ventricular Rate:  63 PR Interval:    QRS Duration:  106 QT Interval:  589 QTC Calculation: 604 R Axis:   89  Text Interpretation: Atrial fibrillation Borderline right axis deviation Prolonged QT interval Artifact in lead(s) I II aVR aVL V2 Confirmed by UNCONFIRMED, DOCTOR ([id number]), editor Fredric Mare, Tammy [phone]) on 09/04/2023 3:41:52 PM  I have reviewed the labs performed to date as well as medications administered while in observation.  Recent changes in the last 24 hours include no events overnight.  Plan  Current plan is for psychiatric disposition.    Irean Hong, MD 09/05/23 [phone]

## 2023-09-05 NOTE — ED Notes (Signed)
 Vol/disposition pending.

## 2023-09-05 NOTE — ED Notes (Signed)
VOL  PENDING  Disposition

## 2023-09-06 ENCOUNTER — Other Ambulatory Visit: Payer: Self-pay | Admitting: Pulmonary Disease

## 2023-09-06 DIAGNOSIS — Z9289 Personal history of other medical treatment: Secondary | ICD-10-CM

## 2023-09-06 DIAGNOSIS — J90 Pleural effusion, not elsewhere classified: Secondary | ICD-10-CM

## 2023-09-06 MED ORDER — DIVALPROEX SODIUM 250 MG PO DR TAB: 250.0000 mg | DELAYED_RELEASE_TABLET | Freq: Two times a day (BID) | ORAL | 0 refills | Status: DC

## 2023-09-06 MED ORDER — LOSARTAN POTASSIUM 100 MG PO TABS: 100.0000 mg | ORAL_TABLET | Freq: Every day | ORAL | 3 refills | Status: AC

## 2023-09-06 MED ORDER — POTASSIUM CHLORIDE CRYS ER 20 MEQ PO TBCR
40.0000 meq | EXTENDED_RELEASE_TABLET | Freq: Once | ORAL | Status: AC
  Administered 2023-09-06: 40 meq via ORAL
  Filled 2023-09-06: qty 2

## 2023-09-06 MED ORDER — POTASSIUM CHLORIDE CRYS ER 20 MEQ PO TBCR: 20.0000 meq | EXTENDED_RELEASE_TABLET | Freq: Every day | ORAL | 3 refills | Status: AC

## 2023-09-06 NOTE — Discharge Instructions (Addendum)
 You have been seen and cleared by psychiatry.  Their recommendations are: "recommend she start Depakote 250mg  po BID.  She can continue follow up with her outpatient provider as she will need one month valproic acid labs."  Your potassium was also slightly low at 2.9.  You have been given potassium replacement while you have been here in the emergency department and we recommend that your doctor recheck this as an outpatient this week.  Please return to the emergency department if you have any thoughts of wanting to harm yourself or anyone else, hallucinations that are telling you to harm yourself or others, chest pain or shortness of breath, vomiting, blood in your stool or black tarry stools, any other symptom concerning to you.

## 2023-09-06 NOTE — Addendum Note (Signed)
 Addended by: Jani Gravel on: 09/06/2023 01:35 PM   Modules accepted: Orders

## 2023-09-06 NOTE — Telephone Encounter (Signed)
 Called and spoke with spouse per DPR. Informed spouse that per Dr. Kirke Corin patient to continue Losartan and Potassium. Spouse verbalizes understanding. Prescriptions sent to preferred pharmacy.

## 2023-09-06 NOTE — ED Provider Notes (Signed)
 3:00 AM Pt has been seen and cleared by psychiatry.  No acute medical complaints.  Patient resting comfortably with son at bedside.  Psychiatry recommended starting Depakote 250 mg twice daily with close follow-up with her outpatient doctors for monitoring of her valproic acid level.  Patient and family reports she has not had any hallucinations since being here in the emergency department for several days and she denies SI, HI and contracts for safety.  They are comfortable with the plan to take her home.  They do not want her placed in a nursing facility.  Will work on discharge papers and prescribed Depakote 250 mg twice daily.   Jazariah Teall, Layla Maw, DO 09/06/23 [phone]

## 2023-09-14 ENCOUNTER — Ambulatory Visit
Admission: RE | Admit: 2023-09-14 | Discharge: 2023-09-14 | Disposition: A | Discharge status: Home / Self Care | Source: Ambulatory Visit | Attending: Pulmonary Disease | Admitting: Pulmonary Disease

## 2023-09-14 DIAGNOSIS — Z9289 Personal history of other medical treatment: Secondary | ICD-10-CM | POA: Diagnosis present

## 2023-09-14 DIAGNOSIS — J90 Pleural effusion, not elsewhere classified: Secondary | ICD-10-CM | POA: Insufficient documentation

## 2023-09-19 ENCOUNTER — Telehealth: Payer: Self-pay | Admitting: Physician Assistant

## 2023-09-19 ENCOUNTER — Encounter: Payer: Self-pay | Admitting: Physician Assistant

## 2023-09-19 ENCOUNTER — Ambulatory Visit: Attending: Physician Assistant | Admitting: Physician Assistant

## 2023-09-19 VITALS — BP 106/68 | HR 66 | Ht 62.0 in | Wt 102.6 lb

## 2023-09-19 DIAGNOSIS — E785 Hyperlipidemia, unspecified: Secondary | ICD-10-CM

## 2023-09-19 DIAGNOSIS — I2510 Atherosclerotic heart disease of native coronary artery without angina pectoris: Secondary | ICD-10-CM

## 2023-09-19 DIAGNOSIS — I10 Essential (primary) hypertension: Secondary | ICD-10-CM

## 2023-09-19 DIAGNOSIS — I340 Nonrheumatic mitral (valve) insufficiency: Secondary | ICD-10-CM

## 2023-09-19 DIAGNOSIS — I480 Paroxysmal atrial fibrillation: Secondary | ICD-10-CM

## 2023-09-19 DIAGNOSIS — I5032 Chronic diastolic (congestive) heart failure: Secondary | ICD-10-CM | POA: Diagnosis not present

## 2023-09-19 NOTE — Progress Notes (Signed)
 Cardiology Office Note    Date:  09/19/2023   ID:  Makela, Niehoff 11-18-1945, MRN [id number]  PCP:  Mick Sell, MD  Cardiologist:  Lorine Bears, MD  Electrophysiologist:  None   Chief Complaint: Follow up  History of Present Illness:   Taylor Chambers is a 78 y.o. female with history of nonobstructive CAD by coronary CTA in 04/2022, HFimpEF, PAF diagnosed in 09/2022, moderate to severe mitral regurgitation, HTN, HLD, bipolar disorder, chronic hypoxic respiratory failure on nocturnal oxygen and BiPAP, and COPD with prior tobacco use quitting in 2023 who presents for follow-up of CAD, cardiomyopathy, and A-fib.  She was previously followed by Dr. Juliann Pares, establishing care with Dr. Kirke Corin in 12/2022.  Echo in 02/2020 showed an EF greater than 55%, normal wall motion, grade 1 diastolic dysfunction, normal RV systolic function, moderate mitral regurgitation, and mild to moderate tricuspid regurgitation.  Lexiscan MPI in 02/2020 showed no evidence of ischemia with an EF of 64%.  Echo in 01/2022 showed an EF of 45% with moderate mitral regurgitation.  Coronary CTA in 04/2022 showed a calcium score of 95, which was the 59th percentile.  There was 25% proximal LCx stenosis with less than 25% proximal RCA stenosis.  Noncardiac overread showed no acute extracardiac findings with resolution of tree-in-bud nodules when compared to prior study as well as a solid pulmonary nodules in the right lower lobe that were unchanged when compared to study from 07/2021 with recommendation for annual follow-up (patient already undergoing lung cancer screening CTs).  She was admitted to the hospital in 09/2022 with COPD exacerbation and was noted to be in A-fib with RVR on presentation.  She converted to sinus rhythm with diltiazem.  She was not discharged on anticoagulation.  Upon establishing care with our office in 12/2022, she did note occasional exertional chest pain and shortness of breath as well as  frequent palpitations, particularly with exertion.  She was in sinus rhythm with PACs.  To further evaluate palpitation burden, she underwent a Zio patch that showed a predominant rhythm of sinus with an average rate of 84 bpm (range 56 to 214 bpm), 106 episodes of SVT lasting up to 2 minutes and 10 seconds, 1% A-fib/flutter burden with ventricular rates ranging from 119 to 213 bpm with an average ventricular rate of 153 bpm and the longest episode lasting 2 hours and 4 minutes.  Echo in 01/2023 showed an EF of 55 to 60%, no regional wall motion abnormalities, normal RV systolic function, ventricular cavity size, and RVSP, moderate to severe mitral valve regurgitation with mild to late systolic prolapse of multiple segments of the anterior leaflet of the mitral valve, mild to moderate tricuspid regurgitation, and an estimated right atrial pressure of 3 mmHg.  To further evaluate her mitral regurgitation, she underwent TEE on 04/20/2023 that showed an EF of 60 to 65%, no regional wall motion maladies, normal RV systolic function and ventricular cavity size, no left atrial or left atrial appendage thrombus, moderate mitral regurgitation, aortic atherosclerosis involving the aortic arch and descending aorta, estimated right atrial pressure of 3 mmHg, and no evidence of interatrial shunt.   She was admitted to the hospital in 06/2023 with sepsis with acute hypoxic and hypercapnic respiratory failure secondary to acute hypoxic and hypercapnic respiratory failure secondary to pneumonia, COPD exacerbation, HFpEF.  Echo showed an EF of 55 to 60%, no regional wall motion abnormalities, grade 2 diastolic dysfunction, normal RV systolic function and ventricular cavity size, moderately elevated  RVSP estimated at 53.2 mmHg, degenerative mitral valve with moderate to severe regurgitation, mild to moderate tricuspid regurgitation, trivial aortic insufficiency with aortic valve sclerosis without evidence of stenosis, and an  estimated right atrial pressure of 8 mmHg.  Due to persisting dyspnea she was readmitted in 06/2023 and underwent right-sided thoracentesis yielding 300 mL of pleural fluid.    She was admitted to the hospital in 08/2023 with sepsis with acute on chronic hypoxic and hypercapnic respiratory failure requiring BiPAP secondary to COPD exacerbation, pneumonia, and HFpEF.  Admission was notable for A-fib with RVR converting to sinus rhythm with IV amiodarone.  Minimal troponin elevation felt to represent supply/demand ischemia in the setting of the above.  She was admitted to the hospital from 3/30 through 09/06/2023 with hallucinations.  Chest x-ray obtained at that time showed underlying COPD with improving left upper lobe nodular pulmonary infiltrate.  There was concern for prolonged QTc with significant underlying artifact and wandering possibly contributing.  CT of the chest without contrast pending over read.  She comes in doing well from a cardiac perspective and is without symptoms of angina or cardiac decompensation.  Dyspnea is significantly improved.  No lower extremity swelling or progressive orthopnea.  No falls, hematochezia, or melena.  Weight remains stable.  She reports adherence to BiPAP with supplemental oxygen at nighttime and will intermittently wear oxygen during the day if she is exerting herself.  She is hypoxic in the office today 88% on room air, though is asymptomatic.  Overall feels well from a cardiac perspective and does not have any acute concerns at this time.   Labs independently reviewed: 09/2023 - potassium 3.9, BUN 16, serum creatinine 0.8, albumin 3.8, AST/ALT normal 08/2023 - magnesium 2.4, Hgb 11.9, PLT 236 07/2023 - TSH 5.88, free T4 elevated 1.27 06/2023 - A1c 5.6 05/2023 - TC 159, TG 121, HDL 64, LDL 74  Past Medical History:  Diagnosis Date   Allergies    Anxiety    Aortic atherosclerosis (HCC)    Bipolar affective disorder (HCC)    COPD (chronic obstructive  pulmonary disease) (HCC)    Coronary artery calcification seen on CT scan    Depression    Diastolic dysfunction 02/24/2020   a.) TTE 02/24/2020: EF >55%; triv PR, mild TR, mod MR; G1DD.   DOE (dyspnea on exertion)    Emphysema lung (HCC)    History of cataract    HLD (hyperlipidemia)    Hypertension    Macular degeneration    Migraines    Osteoporosis    Pneumonia    Tobacco use     Past Surgical History:  Procedure Laterality Date   CATARACT EXTRACTION     COLONOSCOPY N/A 08/30/2021   Procedure: COLONOSCOPY;  Surgeon: Jaynie Collins, DO;  Location: [facility];  Service: Gastroenterology;  Laterality: N/A;   DILATION AND CURETTAGE OF UTERUS     TEE WITHOUT CARDIOVERSION N/A 04/20/2023   Procedure: TRANSESOPHAGEAL ECHOCARDIOGRAM;  Surgeon: Antonieta Iba, MD;  Location: [facility] ORS;  Service: Cardiovascular;  Laterality: N/A;   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   Procedure: TOTAL HIP ARTHROPLASTY;  Surgeon: Donato Heinz, MD;  Location: [facility] ORS;  Service: Orthopedics;  Laterality: Right;   WISDOM TOOTH EXTRACTION     WRIST GANGLION EXCISION      Current Medications: Current Meds  Medication Sig   albuterol (VENTOLIN HFA) 108 (90 Base) MCG/ACT inhaler Inhale 2 puffs into the lungs every 6 (six) hours as needed for wheezing or  shortness of breath.   apixaban (ELIQUIS) 5 MG TABS tablet Take 1 tablet (5 mg total) by mouth 2 (two) times daily.   Calcium Carbonate (CALCIUM 500 PO) Take 500 mg by mouth daily at 12 noon.   Cholecalciferol (VITAMIN D3) 1.25 MG (50000 UT) CAPS Take 5,000 Units by mouth daily in the afternoon.   cyclobenzaprine (FLEXERIL) 10 MG tablet Take 10 mg by mouth 2 (two) times daily as needed for muscle spasms.   divalproex (DEPAKOTE) 250 MG DR tablet Take 1 tablet (250 mg total) by mouth 2 (two) times daily.   Dupilumab 300 MG/2ML SOAJ Inject 300 mg into the skin every 14 (fourteen) days. Saturday   EPINEPHrine 0.3 mg/0.3 mL IJ SOAJ injection  Inject 0.3 mg into the muscle as needed for anaphylaxis.   furosemide (LASIX) 40 MG tablet Take 1 tablet (40 mg total) by mouth daily.   ipratropium (ATROVENT) 0.06 % nasal spray Place 1 spray into both nostrils daily.   ipratropium-albuterol (DUONEB) 0.5-2.5 (3) MG/3ML SOLN Take 3 mLs by nebulization 2 (two) times daily. Mix with budesonide   metoprolol succinate (TOPROL-XL) 25 MG 24 hr tablet Take 1 tablet (25 mg total) by mouth 2 (two) times daily.   Multiple Vitamin (MULTI-VITAMIN) tablet Take 1 tablet by mouth daily.   Multiple Vitamins-Minerals (PRESERVISION AREDS 2+MULTI VIT PO) Take 2 tablets by mouth daily.   OHTUVAYRE  3 MG/2.5ML SUSP Take 3 mg by nebulization daily.   potassium chloride SA (KLOR-CON M) 20 MEQ tablet Take 1 tablet (20 mEq total) by mouth daily.   pravastatin (PRAVACHOL) 10 MG tablet Take 10 mg by mouth daily.   sertraline (ZOLOFT) 100 MG tablet Take 1 tablet (100 mg total) by mouth daily.   sulfamethoxazole-trimethoprim (BACTRIM) 400-80 MG tablet Take 1 tablet by mouth 3 (three) times a week.   [DISCONTINUED] amiodarone (PACERONE) 200 MG tablet Take 2 tablets (400 mg total) by mouth 2 (two) times daily for 4 days, THEN 1 tablet (200 mg total) 2 (two) times daily for 7 days, THEN 1 tablet (200 mg total) daily.    Allergies:   Ativan [lorazepam] and Bupropion   Social History   Socioeconomic History   Marital status: Married    Spouse name: Josefina Nian   Number of children: Not on file   Years of education: Not on file   Highest education level: Not on file  Occupational History   Not on file  Tobacco Use   Smoking status: Former    Current packs/day: 0.00    Average packs/day: 0.3 packs/day for 50.0 years (12.5 ttl pk-yrs)    Types: Cigarettes    Start date: 01/10/1972    Quit date: 01/09/2022    Years since quitting: 1.6   Smokeless tobacco: Never  Vaping Use   Vaping status: Never Used  Substance and Sexual Activity   Alcohol use: Never   Drug use: Never    Sexual activity: Not Currently  Other Topics Concern   Not on file  Social History Narrative   Not on file   Social Drivers of Health   Financial Resource Strain: Low Risk  (09/11/2023)   Received from [facility]   Overall Financial Resource Strain (CARDIA)    Difficulty of Paying Living Expenses: Not hard at all  Food Insecurity: No Food Insecurity (09/11/2023)   Received from [facility]   Hunger Vital Sign    Worried About Running Out of Food in the Last Year: Never true  Ran Out of Food in the Last Year: Never true  Transportation Needs: No Transportation Needs (09/11/2023)   Received from [facility]    In the past 12 months, has lack of transportation kept you from medical appointments or from getting medications?: No    Lack of Transportation (Non-Medical): No  Physical Activity: Not on file  Stress: Not on file  Social Connections: Moderately Isolated (08/23/2023)   Social Connection and Isolation Panel [NHANES]    Frequency of Communication with Friends and Family: Three times a week    Frequency of Social Gatherings with Friends and Family: More than three times a week    Attends Religious Services: Never    Database administrator or Organizations: No    Attends Engineer, structural: Never    Marital Status: Married     Family History:  The patient's family history includes Breast cancer (age of onset: 42) in her mother; Heart Problems in her maternal uncle, paternal aunt, and paternal uncle.  ROS:   12-point review of systems is negative unless otherwise noted in the HPI.   EKGs/Labs/Other Studies Reviewed:    Studies reviewed were summarized above. The additional studies were reviewed today:  2D echo 06/26/2023: 1. Left ventricular ejection fraction, by estimation, is 55 to 60%. The  left ventricle has normal function. The left ventricle has no regional  wall motion  abnormalities. Left ventricular diastolic parameters are  consistent with Grade II diastolic  dysfunction (pseudonormalization).   2. Right ventricular systolic function is normal. The right ventricular  size is normal. There is moderately elevated pulmonary artery systolic  pressure.   3. The mitral valve is degenerative. Moderate to severe mitral valve  regurgitation. No evidence of mitral stenosis.   4. Tricuspid valve regurgitation is mild to moderate.   5. The aortic valve is tricuspid. There is mild thickening of the aortic  valve. Aortic valve regurgitation is trivial. Aortic valve  sclerosis/calcification is present, without any evidence of aortic  stenosis.   6. The inferior vena cava is normal in size with <50% respiratory  variability, suggesting right atrial pressure of 8 mmHg.  __________  TEE 04/20/2023: 1. Left ventricular ejection fraction, by estimation, is 60 to 65%. The  left ventricle has normal function. The left ventricle has no regional  wall motion abnormalities.   2. Right ventricular systolic function is normal. The right ventricular  size is normal.   3. No left atrial/left atrial appendage thrombus was detected.   4. The mitral valve is normal in structure. Moderate mitral valve  regurgitation. No evidence of mitral stenosis. No significant valve  prolapse noted.   5. The aortic valve is normal in structure. Aortic valve regurgitation is  not visualized. No aortic stenosis is present.   6. There is mild (Grade II) atheroma plaque involving the aortic arch and  descending aorta.   7. The inferior vena cava is normal in size with greater than 50%  respiratory variability, suggesting right atrial pressure of 3 mmHg.   8. Agitated saline contrast bubble study was negative, with no evidence  of any interatrial shunt.   Conclusion(s)/Recommendation(s): Normal biventricular function without  evidence of hemodynamically significant valvular heart disease.    FINDINGS   Left Ventricle: Left ventricular ejection fraction, by estimation, is 60  to 65%. The left ventricle has normal function. The left ventricle has no  regional wall motion abnormalities. The left ventricular internal cavity  size was normal in size. There is   no left ventricular hypertrophy.  __________   2D echo 01/26/2023: 1. Left ventricular ejection fraction, by estimation, is 55 to 60%. The  left ventricle has normal function. The left ventricle has no regional  wall motion abnormalities. Left ventricular diastolic parameters are  indeterminate. The average left  ventricular global longitudinal strain is -19.0 %.   2. Right ventricular systolic function is normal. The right ventricular  size is normal. There is normal pulmonary artery systolic pressure. The  estimated right ventricular systolic pressure is 28.8 mmHg.   3. The mitral valve is normal in structure. Moderate to severe mitral  valve regurgitation. No evidence of mitral stenosis. There is mild late  systolic prolapse of multiple segments of the anterior leaflet of the  mitral valve.   4. Tricuspid valve regurgitation is mild to moderate.   5. The aortic valve is normal in structure. Aortic valve regurgitation is  not visualized. No aortic stenosis is present.   6. The inferior vena cava is normal in size with greater than 50%  respiratory variability, suggesting right atrial pressure of 3 mmHg.  __________   Luci Bank patch 12/2022: Patient had a min HR of 56 bpm, max HR of 214 bpm, and avg HR of 84 bpm. Predominant underlying rhythm was Sinus Rhythm. 106 Supraventricular Tachycardia runs occurred, the run with the fastest interval lasting 6 beats with a max rate of 214 bpm, the longest lasting 2 mins 10 secs with an avg rate of 146 bpm. Atrial Fibrillation/Flutter occurred (1% burden), ranging from 119-213 bpm (avg of 153 bpm), the longest lasting 2 hours 4 mins with an avg rate of 152 bpm.  Occasional PACs and  rare PVCs. __________   Coronary CTA 04/07/2022: FINDINGS: Aorta: Normal size. Mild aortic root and descending aorta calcifications. No dissection.   Aortic Valve:  Trileaflet.  No calcifications.   Coronary Arteries:  Normal coronary origin.  Right dominance.   RCA is a dominant artery that gives rise to PDA and PLA. There is calcified plaque proximally causing minimal stenosis (<25%).   Left main gives rise to LAD and LCX arteries.  LM has no stenosis.   LAD has no plaque.   LCX is a non-dominant artery that gives rise to two obtuse marginal branches. There is calcified plaque proximally causing mild stenosis (25-49%).   Other findings:   Normal pulmonary vein drainage into the left atrium.   Normal left atrial appendage without a thrombus.   Normal size of the pulmonary artery.   IMPRESSION: 1. Coronary calcium score of 95.3. This was 59th percentile for age and sex matched control. 2. Normal coronary origin with right dominance. 3. Mild proximal LCx stenosis (25%). 4. Minimal proximal RCA stenosis (<25%). 5. CAD-RADS 2. Mild non-obstructive CAD (25-49%). Consider non-atherosclerotic causes of chest pain. Consider preventive therapy and risk factor modification. 6. Image quality degraded by motion artifacts.   Noncardiac overread: IMPRESSION: 1. No acute extracardiac findings. 2. Tree-in-bud nodules which were present on most recent prior chest CT have resolved, likely sequela of infection or aspiration. 3. Solid pulmonary nodules of the right lower lobe are unchanged in size when compared with prior lung cancer screening CT chest CT dated July 16, 2021. Recommend attention on annual lung cancer screening CT. 4. Aortic Atherosclerosis and Emphysema. __________   2D echo 01/18/2022 Gavin Potters): MILD LV SYSTOLIC DYSFUNCTION (See above)  NORMAL RIGHT VENTRICULAR SYSTOLIC FUNCTION  NO VALVULAR STENOSIS  MODERATE MR  MILD  TR, PR  EF 45%  __________    Sal Crass MPI 02/24/2020 Ivette Marks): Normal myocardial perfusion scan no evidence of stress-induced  medical ischemia ejection fraction of 64% conclusion negative scan  __________   2D echo 02/24/2020 Ivette Marks): NORMAL LEFT VENTRICULAR SYSTOLIC FUNCTION WITH AN ESTIMATED EF = >55 %  NORMAL RIGHT VENTRICULAR SYSTOLIC FUNCTION  MODERATE MITRAL VALVE INSUFFICIENCY  MILD-TO-MODERATE TRICUSPID VALVE INSUFFICIENCY  NO VALVULAR STENOSIS    EKG:  EKG is ordered today.  The EKG ordered today demonstrates NSR, 66 bpm, baseline artifact and wandering, nonspecific ST-T changes   Recent Labs: 06/24/2023: TSH 7.817 08/21/2023: ALT 49 08/23/2023: B Natriuretic Peptide 1,377.0 09/03/2023: BUN 28; Creatinine, Ser 0.94; Hemoglobin 11.9; Magnesium 2.4; Platelets 236; Potassium 2.9; Sodium 140  Recent Lipid Panel    Component Value Date/Time   CHOL 159 05/16/2023 1045   TRIG 121 05/16/2023 1045   HDL 64 05/16/2023 1045   CHOLHDL 2.5 05/16/2023 1045   LDLCALC 74 05/16/2023 1045   LDLDIRECT 71 05/16/2023 1045    PHYSICAL EXAM:    VS:  BP 106/68 (BP Location: Left Arm, Patient Position: Sitting)   Pulse 66   Ht 5\' 2"  (1.575 m)   Wt 102 lb 9.6 oz (46.5 kg)   SpO2 (!) 88%   BMI 18.77 kg/m   BMI: Body mass index is 18.77 kg/m.  Physical Exam Vitals reviewed.  Constitutional:      Appearance: She is well-developed.  HENT:     Head: Normocephalic and atraumatic.  Eyes:     General:        Right eye: No discharge.        Left eye: No discharge.  Neck:     Vascular: No JVD.  Cardiovascular:     Rate and Rhythm: Normal rate and regular rhythm.     Heart sounds: S1 normal and S2 normal. Heart sounds not distant. No midsystolic click and no opening snap. Murmur heard.     Systolic murmur is present with a grade of 1/6 at the lower left sternal border.     No friction rub.  Pulmonary:     Effort: Pulmonary effort is normal. No respiratory distress.     Breath sounds: Normal breath sounds. No  decreased breath sounds, wheezing, rhonchi or rales.  Chest:     Chest wall: No tenderness.  Musculoskeletal:     Cervical back: Normal range of motion.     Right lower leg: No edema.     Left lower leg: No edema.  Skin:    General: Skin is warm and dry.     Nails: There is no clubbing.  Neurological:     Mental Status: She is alert and oriented to person, place, and time.  Psychiatric:        Speech: Speech normal.        Behavior: Behavior normal.        Thought Content: Thought content normal.        Judgment: Judgment normal.     Wt Readings from Last 3 Encounters:  09/19/23 102 lb 9.6 oz (46.5 kg)  08/21/23 100 lb 15.5 oz (45.8 kg)  08/18/23 101 lb (45.8 kg)     ASSESSMENT & PLAN:   PAF: Maintaining sinus rhythm.  With underlying pulmonary disease, abnormal renal function, and prior history of QT prolongation we will discontinue amiodarone.  Continue Toprol-XL 25 mg twice daily.  CHA2DS2-VASc at least 6 (CHF, HTN, age x 2, vascular disease, sex category).  She  remains on apixaban 5 mg twice daily and does not meet reduced dosing criteria.  No falls or symptoms concerning for bleeding.  HFimpEF: Euvolemic and well compensated.  NYHA class is difficult to assess secondary to underlying pulmonary disease.  She remains on Toprol-XL 25 mg twice daily and furosemide 40 mg daily.  No longer on losartan.  Defer further escalation of GDMT given preserved LV systolic function and no heart failure symptoms.  Moderate mitral regurgitation: Moderate by TEE in 04/2023.  Monitor with periodic echo.  Nonobstructive CAD with elevated high-sensitivity troponin: She is doing well and without symptoms concerning for angina or cardiac decompensation.  Elevated troponin felt to be secondary to supply demand ischemia in the setting of acute pulmonary illness.  On apixaban complains of aspirin given underlying A-fib.  Continue aggressive risk factor modification and primary prevention including  metoprolol and pravastatin.  HTN: Blood pressure is well-controlled in the office today.  Continue pharmacotherapy as outlined above.  HLD: LDL 74 in 05/2023.  She remains on pravastatin 10 mg.     Disposition: F/u with Dr. Alvenia Aus or an APP in 3 months.   Medication Adjustments/Labs and Tests Ordered: Current medicines are reviewed at length with the patient today.  Concerns regarding medicines are outlined above. Medication changes, Labs and Tests ordered today are summarized above and listed in the Patient Instructions accessible in Encounters.   Signed, Varney Gentleman, PA-C 09/19/2023 4:47 PM     [facility] [place] [address] [place], [place] [address] [phone]

## 2023-09-19 NOTE — Patient Instructions (Signed)
 Medication Instructions:  Your physician recommends the following medication changes.  STOP TAKING: Amiodarone   *If you need a refill on your cardiac medications before your next appointment, please call your pharmacy*  Lab Work: None ordered at this time   Follow-Up: At [facility], you and your health needs are our priority.  As part of our continuing mission to provide you with exceptional heart care, our providers are all part of one team.  This team includes your primary Cardiologist (physician) and Advanced Practice Providers or APPs (Physician Assistants and Nurse Practitioners) who all work together to provide you with the care you need, when you need it.  Your next appointment:   3 month(s)  Provider:   You may see Antionette Kirks, MD or Varney Gentleman, PA-C

## 2023-09-19 NOTE — Telephone Encounter (Signed)
 Unfortunately, we cannot refill Depakote in the cardiology office.  She will need to contact the prescribing provider if this was filled in the outpatient setting for her PCP.

## 2023-09-19 NOTE — Telephone Encounter (Signed)
 Pt c/o medication issue:  1. Name of Medication:  Divalproex sodium 200mg  twice daily   2. How are you currently taking this medication (dosage and times per day)? See above   3. Are you having a reaction (difficulty breathing--STAT)? No   4. What is your medication issue? Pt spouse called in after appt today to let [place], [place] know the medication above is the other medication she is taking.

## 2023-09-20 NOTE — Telephone Encounter (Signed)
 The patient has been notified of the recommendations from [place].  Pt verbalized understanding. All questions (if any) were answered.

## 2023-11-02 IMAGING — MR MR HIP*R* W/O CM
4 of 5 series · 25 of 40 positions shown · non-contrast
Comparison: None.

CLINICAL DATA: Chronic right hip pain, worsening in past 2 weeks

EXAM:
MR OF THE RIGHT HIP WITHOUT CONTRAST
TECHNIQUE: Multiplanar, multisequence MR imaging was performed. No intravenous
contrast was administered.

[Series 2: T1 · coronal · right · 4.0mm · 0.56mm/px · 4 of 36 slices shown]
[im 1/36]
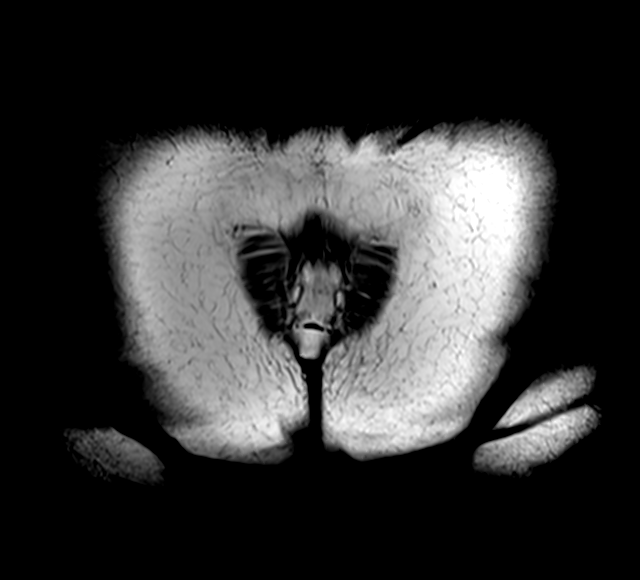
[im 4/36]
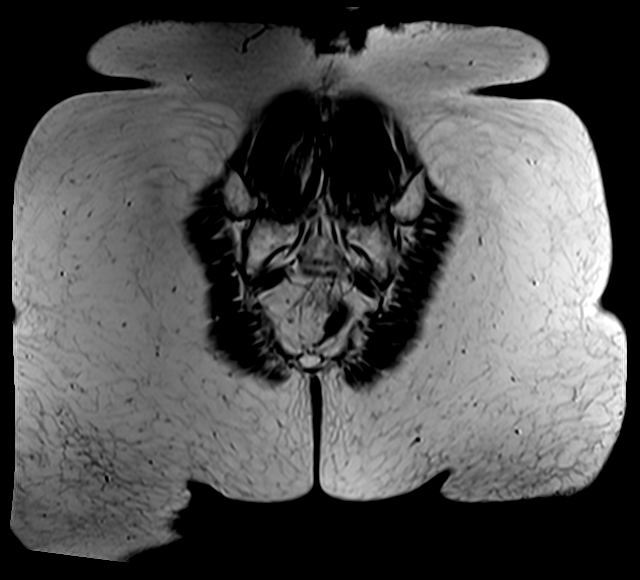
[im 20/36]
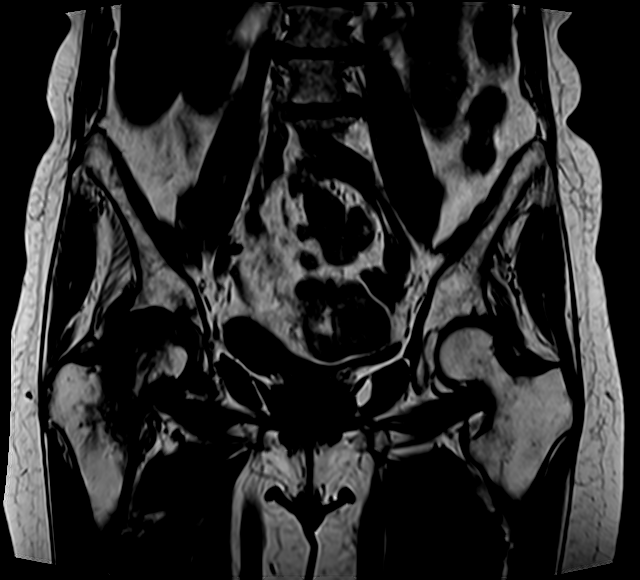
[im 32/36]
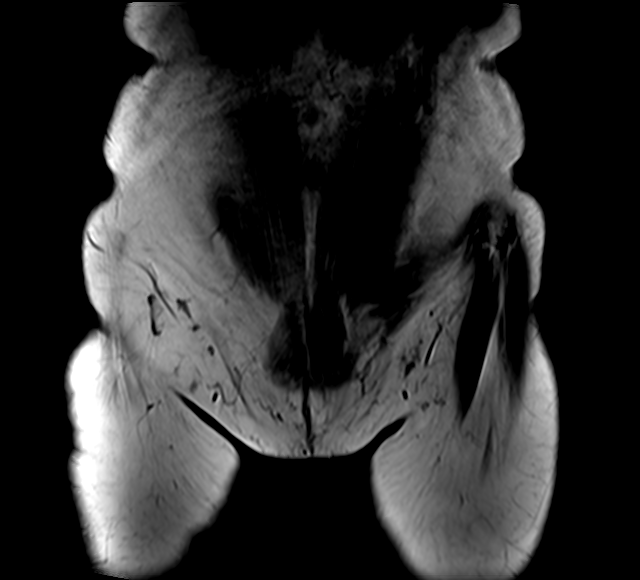

[Series 4: T2 fat-sat · axial · right · 4.0mm · 0.70mm/px · z∈[-151,-6]mm · 8 of 30 slices shown]
[im 1/30]
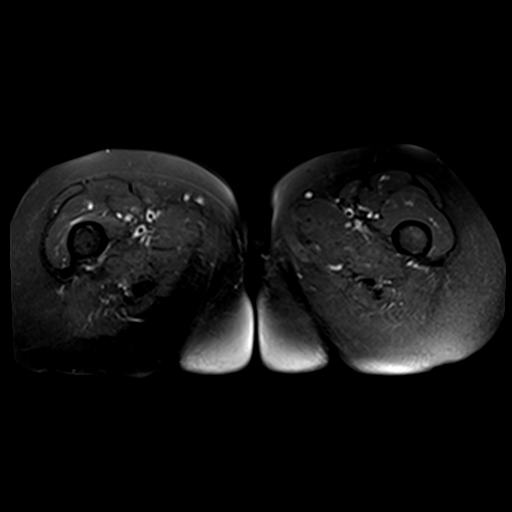
[im 5/30]
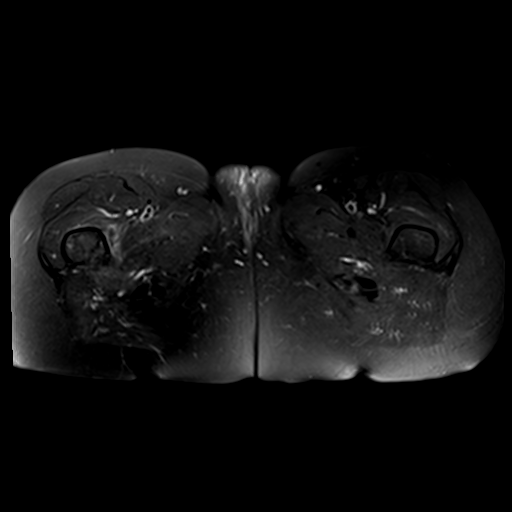
[im 9/30]
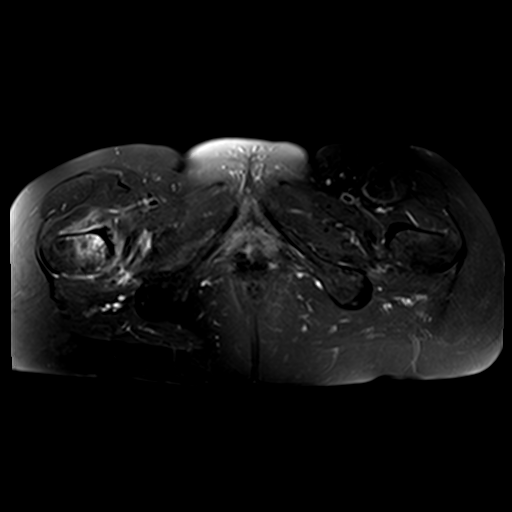
[im 13/30]
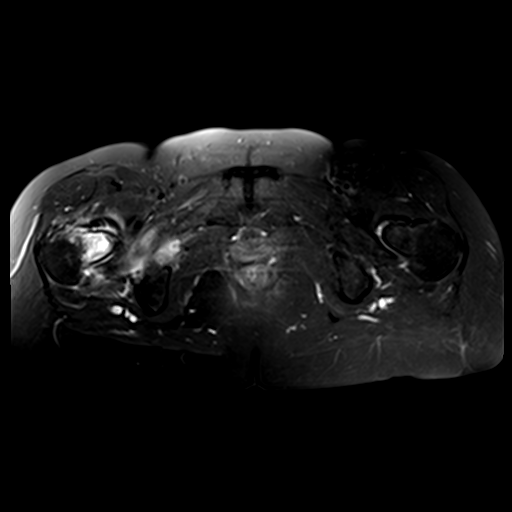
[im 17/30]
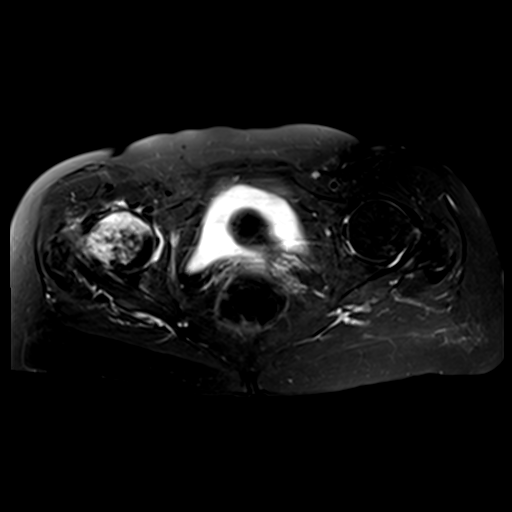
[im 21/30]
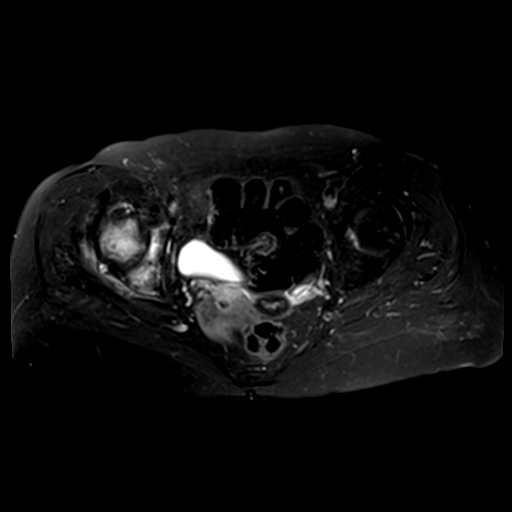
[im 25/30]
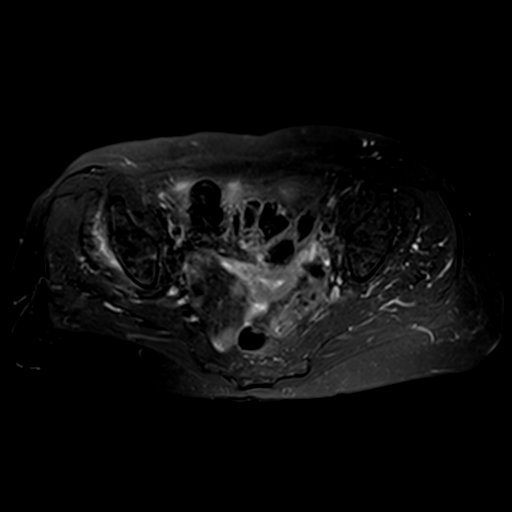
[im 30/30]
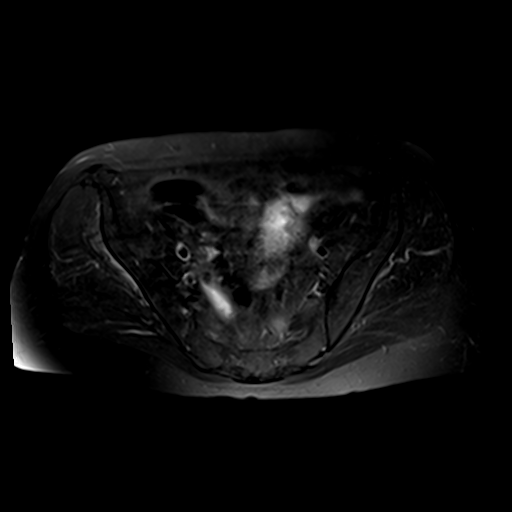

[Series 5: PD fat-sat · sagittal · right · 4.0mm · 0.70mm/px · 6 of 25 slices shown (1 of 2)]
[im 1/25]
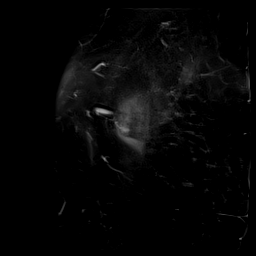
[im 5/25]
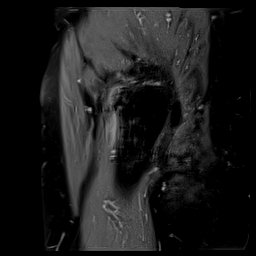
[im 10/25]
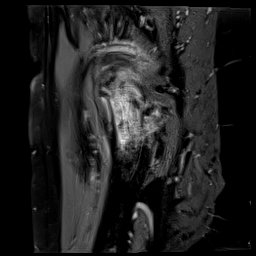
[im 15/25]
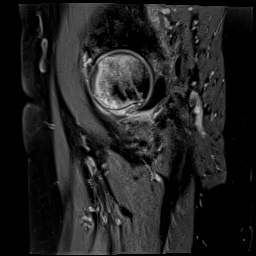
[im 20/25]
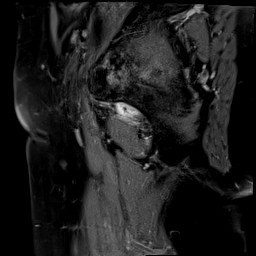
[im 25/25]
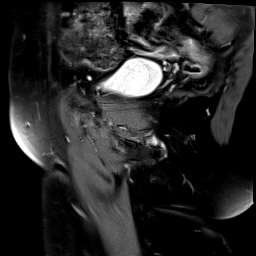

[Series 6: PD fat-sat · coronal · right · 4.0mm · 0.70mm/px · 7 of 29 slices shown (2 of 2)]
[im 1/29]
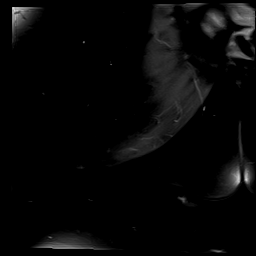
[im 5/29]
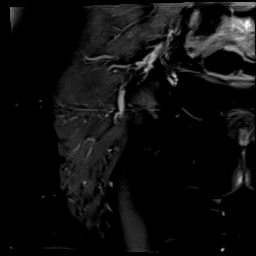
[im 10/29]
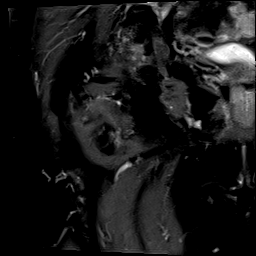
[im 15/29]
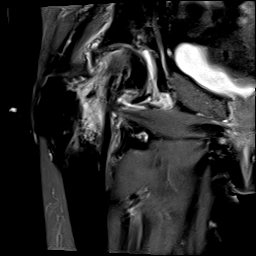
[im 19/29]
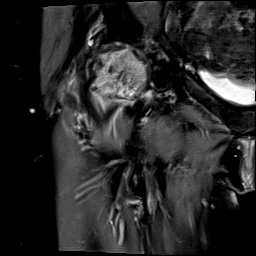
[im 24/29]
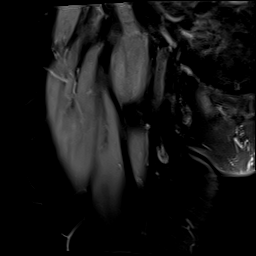
[im 29/29]
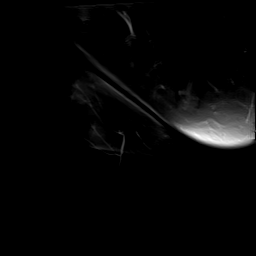

[25 of 40 positions shown; findings below may reference images not displayed]

FINDINGS: Bones: Marked bone marrow edema throughout the right femoral head,
neck, and intertrochanteric region. Developing trabecular fracture
within the basicervical and intertrochanteric aspects of the right
femur (series 3, image 21). Extensive subchondral fracture of the
femoral head superiorly with slight flattening of the femoral head
contour. No typical findings of femoral head avascular necrosis.
Bone marrow edema within the posterior and medial aspects of the
right acetabulum where there is a suspected developing fracture
(series 3, image 15).

Left hip joint intact without fracture, malalignment, or significant
arthropathy. No marrow edema associated with the proximal left
femur. Pelvic bony ring intact without diastasis. SI joints and
pubic symphysis within normal limits. Degenerative disc disease and
facet arthropathy of the included lower lumbar spine.

Articular cartilage and labrum

Articular cartilage: High-grade cartilage loss along the superior
and anterosuperior aspects of the right hip joint.

Labrum: Diffuse labral degeneration. Multiple small cysts along the
acetabular rim may reflect paralabral cysts or ganglia.

Joint or bursal effusion

Joint effusion:  Small right hip joint effusion.

Bursae: None.

Muscles and tendons

Muscles and tendons: The gluteal, hamstring, iliopsoas, rectus
femoris, and adductor tendons appear intact without tear or
significant tendinosis. Intramuscular edema within the right gluteus
minimus muscles and mildly within the right adductor compartment.

Other findings

Miscellaneous: No inguinal lymphadenopathy. No acute findings within
the pelvis.
IMPRESSION: 1. Extensive bone marrow edema throughout the right femoral head,
neck, and intertrochanteric regions with developing trabecular
fractures involving the basicervical and intertrochanteric aspects
of the right femur.
2. Subchondral fracture of the right femoral head superiorly with
slight flattening of the femoral head contour.
3. Bone marrow edema within the posterior and medial aspects of the
right acetabulum where there is a suspected developing fracture.
4. Small right hip joint effusion.
5. Intramuscular edema within the right gluteus minimus and right
adductor muscles, which may represent muscle strain.

These results will be called to the ordering clinician or
representative by the Radiologist Assistant, and communication
documented in the PACS or [REDACTED].

## 2023-11-08 ENCOUNTER — Other Ambulatory Visit: Payer: Self-pay

## 2023-11-08 ENCOUNTER — Encounter: Attending: Pulmonary Disease

## 2023-11-08 DIAGNOSIS — J449 Chronic obstructive pulmonary disease, unspecified: Secondary | ICD-10-CM | POA: Insufficient documentation

## 2023-11-08 DIAGNOSIS — Z87891 Personal history of nicotine dependence: Secondary | ICD-10-CM | POA: Insufficient documentation

## 2023-11-08 NOTE — Progress Notes (Signed)
 Virtual Visit completed. Patient informed on EP and RD appointment and 6 Minute walk test. Patient also informed of patient health questionnaires on My Chart. Patient Verbalizes understanding. Visit diagnosis can be found in [facility] 10/04/2023.

## 2023-11-09 ENCOUNTER — Encounter

## 2023-11-09 VITALS — Ht 62.0 in | Wt 100.7 lb

## 2023-11-09 DIAGNOSIS — Z87891 Personal history of nicotine dependence: Secondary | ICD-10-CM | POA: Diagnosis not present

## 2023-11-09 DIAGNOSIS — J449 Chronic obstructive pulmonary disease, unspecified: Secondary | ICD-10-CM

## 2023-11-09 NOTE — Patient Instructions (Addendum)
 Patient Instructions  Patient Details  Name: Taylor Chambers MRN: [id number] Date of Birth: 1945/10/06 Referring Provider:  Erskin Hearing, MD  Below are your personal goals for exercise, nutrition, and risk factors. Our goal is to help you stay on track towards obtaining and maintaining these goals. We will be discussing your progress on these goals with you throughout the program.  Initial Exercise Prescription:  Initial Exercise Prescription - 11/09/23 1100       Date of Initial Exercise RX and Referring Provider   Date 11/09/23    Referring Provider Dr. Fuad Aleskerov, MD      Oxygen   Oxygen Continuous    Liters 3    Maintain Oxygen Saturation 88% or higher      Recumbant Bike   Level 1    RPM 50    Watts 15    Minutes 15    METs 1.68      NuStep   Level 1    SPM 80    Minutes 15    METs 1.68      Track   Laps 12    Minutes 15    METs 1.65      Prescription Details   Frequency (times per week) 2    Duration Progress to 30 minutes of continuous aerobic without signs/symptoms of physical distress      Intensity   THRR 40-80% of Max Heartrate 108-132    Ratings of Perceived Exertion 11-13    Perceived Dyspnea 0-4      Progression   Progression Continue to progress workloads to maintain intensity without signs/symptoms of physical distress.      Resistance Training   Training Prescription Yes    Weight 2 lb    Reps 10-15             Exercise Goals: Frequency: Be able to perform aerobic exercise two to three times per week in program working toward 2-5 days per week of home exercise.  Intensity: Work with a perceived exertion of 11 (fairly light) - 15 (hard) while following your exercise prescription.  We will make changes to your prescription with you as you progress through the program.   Duration: Be able to do 30 to 45 minutes of continuous aerobic exercise in addition to a 5 minute warm-up and a 5 minute cool-down routine.   Nutrition  Goals: Your personal nutrition goals will be established when you do your nutrition analysis with the dietician.  The following are general nutrition guidelines to follow: Cholesterol < 200mg /day Sodium < 1500mg /day Fiber: Women over 50 yrs - 21 grams per day  Personal Goals:  Personal Goals and Risk Factors at Admission - 11/08/23 1429       Core Components/Risk Factors/Patient Goals on Admission    Weight Management Yes;Weight Gain    Intervention Weight Management: Develop a combined nutrition and exercise program designed to reach desired caloric intake, while maintaining appropriate intake of nutrient and fiber, sodium and fats, and appropriate energy expenditure required for the weight goal.;Weight Management: Provide education and appropriate resources to help participant work on and attain dietary goals.;Obesity: Provide education and appropriate resources to help participant work on and attain dietary goals.    Expected Outcomes Short Term: Continue to assess and modify interventions until short term weight is achieved;Weight Loss: Understanding of general recommendations for a balanced deficit meal plan, which promotes 1-2 lb weight loss per week and includes a negative energy balance of [phone] kcal/d;Understanding recommendations for  meals to include 15-35% energy as protein, 25-35% energy from fat, 35-60% energy from carbohydrates, less than 200mg  of dietary cholesterol, 20-35 gm of total fiber daily;Understanding of distribution of calorie intake throughout the day with the consumption of 4-5 meals/snacks;Weight Gain: Understanding of general recommendations for a high calorie, high protein meal plan that promotes weight gain by distributing calorie intake throughout the day with the consumption for 4-5 meals, snacks, and/or supplements    Improve shortness of breath with ADL's Yes    Intervention Provide education, individualized exercise plan and daily activity instruction to help  decrease symptoms of SOB with activities of daily living.    Expected Outcomes Short Term: Improve cardiorespiratory fitness to achieve a reduction of symptoms when performing ADLs;Long Term: Be able to perform more ADLs without symptoms or delay the onset of symptoms    Hypertension Yes    Intervention Provide education on lifestyle modifcations including regular physical activity/exercise, weight management, moderate sodium restriction and increased consumption of fresh fruit, vegetables, and low fat dairy, alcohol moderation, and smoking cessation.;Monitor prescription use compliance.    Expected Outcomes Short Term: Continued assessment and intervention until BP is < 140/31mm HG in hypertensive participants. < 130/77mm HG in hypertensive participants with diabetes, heart failure or chronic kidney disease.;Long Term: Maintenance of blood pressure at goal levels.    Lipids Yes    Intervention Provide education and support for participant on nutrition & aerobic/resistive exercise along with prescribed medications to achieve LDL 70mg , HDL >40mg .    Expected Outcomes Short Term: Participant states understanding of desired cholesterol values and is compliant with medications prescribed. Participant is following exercise prescription and nutrition guidelines.;Long Term: Cholesterol controlled with medications as prescribed, with individualized exercise RX and with personalized nutrition plan. Value goals: LDL < 70mg , HDL > 40 mg.            Exercise Goals and Review:  Exercise Goals     Row Name 11/09/23 1124             Exercise Goals   Increase Physical Activity Yes       Intervention Provide advice, education, support and counseling about physical activity/exercise needs.;Develop an individualized exercise prescription for aerobic and resistive training based on initial evaluation findings, risk stratification, comorbidities and participant's personal goals.       Expected Outcomes Short  Term: Attend rehab on a regular basis to increase amount of physical activity.;Long Term: Exercising regularly at least 3-5 days a week.;Long Term: Add in home exercise to make exercise part of routine and to increase amount of physical activity.       Increase Strength and Stamina Yes       Intervention Develop an individualized exercise prescription for aerobic and resistive training based on initial evaluation findings, risk stratification, comorbidities and participant's personal goals.;Provide advice, education, support and counseling about physical activity/exercise needs.       Expected Outcomes Short Term: Increase workloads from initial exercise prescription for resistance, speed, and METs.;Short Term: Perform resistance training exercises routinely during rehab and add in resistance training at home;Long Term: Improve cardiorespiratory fitness, muscular endurance and strength as measured by increased METs and functional capacity ( )       Able to understand and use rate of perceived exertion (RPE) scale Yes       Intervention Provide education and explanation on how to use RPE scale       Expected Outcomes Short Term: Able to use RPE daily in rehab to express  subjective intensity level;Long Term:  Able to use RPE to guide intensity level when exercising independently       Able to understand and use Dyspnea scale Yes       Intervention Provide education and explanation on how to use Dyspnea scale       Expected Outcomes Short Term: Able to use Dyspnea scale daily in rehab to express subjective sense of shortness of breath during exertion;Long Term: Able to use Dyspnea scale to guide intensity level when exercising independently       Knowledge and understanding of Target Heart Rate Range (THRR) Yes       Intervention Provide education and explanation of THRR including how the numbers were predicted and where they are located for reference       Expected Outcomes Short Term: Able to  state/look up THRR;Long Term: Able to use THRR to govern intensity when exercising independently;Short Term: Able to use daily as guideline for intensity in rehab       Able to check pulse independently Yes       Intervention Provide education and demonstration on how to check pulse in carotid and radial arteries.;Review the importance of being able to check your own pulse for safety during independent exercise       Expected Outcomes Short Term: Able to explain why pulse checking is important during independent exercise;Long Term: Able to check pulse independently and accurately       Understanding of Exercise Prescription Yes       Intervention Provide education, explanation, and written materials on patient's individual exercise prescription       Expected Outcomes Short Term: Able to explain program exercise prescription;Long Term: Able to explain home exercise prescription to exercise independently

## 2023-11-09 NOTE — Progress Notes (Signed)
 Pulmonary Individual Treatment Plan  Patient Details  Name: Taylor Chambers MRN: [id number] Date of Birth: 1945/10/12 Referring Provider:   Gattis Kass Pulmonary Rehab from 11/09/2023 in [facility] Cardiac and Pulmonary Re[facility]  Referring Provider Dr. Erskin Hearing, MD       Initial Encounter Date:  Flowsheet Row Pulmonary Rehab from 11/09/2023 in [facility] Cardiac and Pulmonary Rehab  Date 11/09/23       Visit Diagnosis: Chronic obstructive pulmonary disease, unspecified COPD type (HCC)  Patient's Home Medications on Admission:  Current Outpatient Medications:    albuterol  (VENTOLIN  HFA) 108 (90 Base) MCG/ACT inhaler, Inhale 2 puffs into the lungs every 6 (six) hours as needed for wheezing or shortness of breath., Disp: 8 g, Rfl: 2   apixaban  (ELIQUIS ) 5 MG TABS tablet, Take 1 tablet (5 mg total) by mouth 2 (two) times daily., Disp: 90 tablet, Rfl: 1   budesonide  (PULMICORT ) 0.25 MG/2ML nebulizer solution, SMARTSIG:2 Milliliter(s) Twice Daily, Disp: , Rfl:    Calcium Carbonate (CALCIUM 500 PO), Take 500 mg by mouth daily at 12 noon., Disp: , Rfl:    Cholecalciferol  (VITAMIN D3) 1.25 MG (50000 UT) CAPS, Take 5,000 Units by mouth daily in the afternoon., Disp: , Rfl:    COMBIVENT  RESPIMAT 20-100 MCG/ACT AERS respimat, Inhale into the lungs., Disp: , Rfl:    cyclobenzaprine (FLEXERIL) 10 MG tablet, Take 10 mg by mouth 2 (two) times daily as needed for muscle spasms., Disp: , Rfl:    divalproex  (DEPAKOTE ) 250 MG DR tablet, Take 1 tablet (250 mg total) by mouth 2 (two) times daily. (Patient not taking: Reported on 11/08/2023), Disp: 60 tablet, Rfl: 0   divalproex  (DEPAKOTE ) 250 MG DR tablet, Take 250 mg by mouth., Disp: , Rfl:    Dupilumab  300 MG/2ML SOAJ, Inject 300 mg into the skin every 14 (fourteen) days. Saturday, Disp: , Rfl:    DUPIXENT  300 MG/2ML prefilled syringe, Inject 300 mg into the skin., Disp: , Rfl:    EPINEPHrine 0.3 mg/0.3 mL IJ SOAJ injection, Inject 0.3 mg into the muscle as  needed for anaphylaxis., Disp: , Rfl:    furosemide  (LASIX ) 40 MG tablet, Take 1 tablet (40 mg total) by mouth daily., Disp: 30 tablet, Rfl: 0   ipratropium (ATROVENT ) 0.06 % nasal spray, Place 1 spray into both nostrils daily., Disp: , Rfl:    ipratropium-albuterol  (DUONEB) 0.5-2.5 (3) MG/3ML SOLN, Take 3 mLs by nebulization 2 (two) times daily. Mix with budesonide  (Patient not taking: Reported on 11/08/2023), Disp: , Rfl:    ipratropium-albuterol  (DUONEB) 0.5-2.5 (3) MG/3ML SOLN, Inhale 3 mLs into the lungs., Disp: , Rfl:    losartan  (COZAAR ) 100 MG tablet, Take 1 tablet (100 mg total) by mouth daily., Disp: 90 tablet, Rfl: 3   metoprolol  succinate (TOPROL -XL) 25 MG 24 hr tablet, Take 1 tablet (25 mg total) by mouth 2 (two) times daily., Disp: 60 tablet, Rfl: 0   Multiple Vitamin (MULTI-VITAMIN) tablet, Take 1 tablet by mouth daily., Disp: , Rfl:    Multiple Vitamins-Minerals (PRESERVISION AREDS 2+MULTI VIT PO), Take 2 tablets by mouth daily. (Patient not taking: Reported on 11/08/2023), Disp: , Rfl:    OHTUVAYRE  3 MG/2.5ML SUSP, Take 3 mg by nebulization daily., Disp: , Rfl:    omeprazole (PRILOSEC) 40 MG capsule, Take 40 mg by mouth daily as needed (Heartburn)., Disp: , Rfl:    oxybutynin (DITROPAN) 5 MG tablet, Take 5 mg by mouth., Disp: , Rfl:    Potassium Chloride  40 MEQ/15ML (20%) SOLN, Take 20  mEq by mouth., Disp: , Rfl:    potassium chloride  SA (KLOR-CON  M) 20 MEQ tablet, Take 1 tablet (20 mEq total) by mouth daily., Disp: 90 tablet, Rfl: 3   pravastatin  (PRAVACHOL ) 10 MG tablet, Take 10 mg by mouth daily., Disp: , Rfl:    predniSONE  (DELTASONE ) 20 MG tablet, Take by mouth., Disp: , Rfl:    sertraline  (ZOLOFT ) 100 MG tablet, Take 1 tablet (100 mg total) by mouth daily., Disp: 30 tablet, Rfl: 11   sulfamethoxazole -trimethoprim  (BACTRIM ) 400-80 MG tablet, Take 1 tablet by mouth 3 (three) times a week., Disp: , Rfl:   Past Medical History: Past Medical History:  Diagnosis Date   Allergies     Anxiety    Aortic atherosclerosis (HCC)    Bipolar affective disorder (HCC)    COPD (chronic obstructive pulmonary disease) (HCC)    Coronary artery calcification seen on CT scan    Depression    Diastolic dysfunction 02/24/2020   a.) TTE 02/24/2020: EF >55%; triv PR, mild TR, mod MR; G1DD.   DOE (dyspnea on exertion)    Emphysema lung (HCC)    History of cataract    HLD (hyperlipidemia)    Hypertension    Macular degeneration    Migraines    Osteoporosis    Pneumonia    Tobacco use     Tobacco Use: Social History   Tobacco Use  Smoking Status Former   Current packs/day: 0.00   Average packs/day: 0.3 packs/day for 50.0 years (12.5 ttl pk-yrs)   Types: Cigarettes   Start date: 01/10/1972   Quit date: 01/09/2022   Years since quitting: 1.8  Smokeless Tobacco Never    Labs: Review Flowsheet  More data exists      Latest Ref Rng & Units 06/27/2023 06/29/2023 07/02/2023 07/17/2023 08/21/2023  Labs for ITP Cardiac and Pulmonary Rehab  PH, Arterial 7.35 - 7.45 7.44  - 7.6  7.4  -  PCO2 arterial 32 - 48 mmHg 46  - 47  33  -  Bicarbonate 20.0 - 28.0 mmol/L 31.2  26.8  26.6  46.1  20.4  25.0   Acid-base deficit 0.0 - 2.0 mmol/L - 0.1  4.5  - 3.6  3.1   O2 Saturation % 99.9  95  55.1  97.1  89.2  90.4     Details       Multiple values from one day are sorted in reverse-chronological order          Pulmonary Assessment Scores:  Pulmonary Assessment Scores     Row Name 11/09/23 1110         mMRC Score   mMRC Score 2              UCSD: Self-administered rating of dyspnea associated with activities of daily living (ADLs) 6-point scale (0 = "not at all" to 5 = "maximal or unable to do because of breathlessness")  Scoring Scores range from 0 to 120.  Minimally important difference is 5 units  CAT: CAT can identify the health impairment of COPD patients and is better correlated with disease progression.  CAT has a scoring range of zero to 40. The CAT score is  classified into four groups of low (less than 10), medium (10 - 20), high (21-30) and very high (31-40) based on the impact level of disease on health status. A CAT score over 10 suggests significant symptoms.  A worsening CAT score could be explained by an exacerbation, poor medication adherence, poor inhaler  technique, or progression of COPD or comorbid conditions.  CAT MCID is 2 points  mMRC: mMRC (Modified Medical Research Council) Dyspnea Scale is used to assess the degree of baseline functional disability in patients of respiratory disease due to dyspnea. No minimal important difference is established. A decrease in score of 1 point or greater is considered a positive change.   Pulmonary Function Assessment:  Pulmonary Function Assessment - 11/08/23 1429       Breath   Shortness of Breath Yes;Fear of Shortness of Breath;Limiting activity;Panic with Shortness of Breath             Exercise Target Goals: Exercise Program Goal: Individual exercise prescription set using results from initial 6 min walk test and THRR while considering  patient's activity barriers and safety.   Exercise Prescription Goal: Initial exercise prescription builds to 30-45 minutes a day of aerobic activity, 2-3 days per week.  Home exercise guidelines will be given to patient during program as part of exercise prescription that the participant will acknowledge.  Education: Aerobic Exercise: - Group verbal and visual presentation on the components of exercise prescription. Introduces F.I.T.T principle from ACSM for exercise prescriptions.  Reviews F.I.T.T. principles of aerobic exercise including progression. Written material given at graduation.   Education: Resistance Exercise: - Group verbal and visual presentation on the components of exercise prescription. Introduces F.I.T.T principle from ACSM for exercise prescriptions  Reviews F.I.T.T. principles of resistance exercise including progression. Written  material given at graduation.    Education: Exercise & Equipment Safety: - Individual verbal instruction and demonstration of equipment use and safety with use of the equipment. Flowsheet Row Pulmonary Rehab from 11/08/2023 in [facility] Cardiac and Pulmonary Rehab  Date 11/08/23  Educator [facility]  Instruction Review Code 1- Verbalizes Understanding       Education: Exercise Physiology & General Exercise Guidelines: - Group verbal and written instruction with models to review the exercise physiology of the cardiovascular system and associated critical values. Provides general exercise guidelines with specific guidelines to those with heart or lung disease.    Education: Flexibility, Balance, Mind/Body Relaxation: - Group verbal and visual presentation with interactive activity on the components of exercise prescription. Introduces F.I.T.T principle from ACSM for exercise prescriptions. Reviews F.I.T.T. principles of flexibility and balance exercise training including progression. Also discusses the mind body connection.  Reviews various relaxation techniques to help reduce and manage stress (i.e. Deep breathing, progressive muscle relaxation, and visualization). Balance handout provided to take home. Written material given at graduation.   Activity Barriers & Risk Stratification:  Activity Barriers & Cardiac Risk Stratification - 11/09/23 1123       Activity Barriers & Cardiac Risk Stratification   Activity Barriers Right Hip Replacement;Other (comment);Assistive Device;Shortness of Breath;History of Falls;Balance Concerns    Comments osteoporosis             6 Minute Walk:  6 Minute Walk     Row Name 11/09/23 1117         6 Minute Walk   Phase Initial     Distance 455 feet     Walk Time 6 minutes     # of Rest Breaks 0     MPH 0.86     METS 1.68     RPE 15     Perceived Dyspnea  4     VO2 Peak 5.88     Symptoms Yes (comment)     Comments used walker     Resting HR 85 bpm  Resting BP 132/68     Resting Oxygen Saturation  95 %     Exercise Oxygen Saturation  during 6 min walk 90 %     Max Ex. HR 105 bpm     Max Ex. BP 154/74     2 Minute Post BP 142/72       Interval HR   1 Minute HR 102     2 Minute HR 105     3 Minute HR 104     4 Minute HR 105     5 Minute HR 101     6 Minute HR 98     2 Minute Post HR 93     Interval Heart Rate? Yes       Interval Oxygen   Interval Oxygen? Yes     Baseline Oxygen Saturation % 95 %     1 Minute Oxygen Saturation % 91 %     1 Minute Liters of Oxygen 3 L  pulsed     2 Minute Oxygen Saturation % 92 %     2 Minute Liters of Oxygen 3 L     3 Minute Oxygen Saturation % 91 %     3 Minute Liters of Oxygen 3 L     4 Minute Oxygen Saturation % 90 %     4 Minute Liters of Oxygen 3 L     5 Minute Oxygen Saturation % 91 %     5 Minute Liters of Oxygen 3 L     6 Minute Oxygen Saturation % 91 %     6 Minute Liters of Oxygen 3 L     2 Minute Post Oxygen Saturation % 95 %     2 Minute Post Liters of Oxygen 3 L             Oxygen Initial Assessment:  Oxygen Initial Assessment - 11/08/23 1428       Home Oxygen   Home Oxygen Device None    Sleep Oxygen Prescription Continuous;BiPAP    Liters per minute 3    Home Exercise Oxygen Prescription None    Home Resting Oxygen Prescription None    Compliance with Home Oxygen Use Yes      Initial 6 min Walk   Oxygen Used None      Program Oxygen Prescription   Program Oxygen Prescription None      Intervention   Short Term Goals To learn and demonstrate proper pursed lip breathing techniques or other breathing techniques. ;To learn and understand importance of monitoring SPO2 with pulse oximeter and demonstrate accurate use of the pulse oximeter.;To learn and exhibit compliance with exercise, home and travel O2 prescription;To learn and understand importance of maintaining oxygen saturations>88%    Long  Term Goals Verbalizes importance of monitoring SPO2 with pulse  oximeter and return demonstration;Exhibits proper breathing techniques, such as pursed lip breathing or other method taught during program session;Demonstrates proper use of MDI's;Exhibits compliance with exercise, home  and travel O2 prescription;Maintenance of O2 saturations>88%;Compliance with respiratory medication             Oxygen Re-Evaluation:   Oxygen Discharge (Final Oxygen Re-Evaluation):   Initial Exercise Prescription:  Initial Exercise Prescription - 11/09/23 1100       Date of Initial Exercise RX and Referring Provider   Date 11/09/23    Referring Provider Dr. Fuad Aleskerov, MD      Oxygen   Oxygen Continuous    Liters 3  Maintain Oxygen Saturation 88% or higher      Recumbant Bike   Level 1    RPM 50    Watts 15    Minutes 15    METs 1.68      NuStep   Level 1    SPM 80    Minutes 15    METs 1.68      Track   Laps 12    Minutes 15    METs 1.65      Prescription Details   Frequency (times per week) 2    Duration Progress to 30 minutes of continuous aerobic without signs/symptoms of physical distress      Intensity   THRR 40-80% of Max Heartrate 108-132    Ratings of Perceived Exertion 11-13    Perceived Dyspnea 0-4      Progression   Progression Continue to progress workloads to maintain intensity without signs/symptoms of physical distress.      Resistance Training   Training Prescription Yes    Weight 2 lb    Reps 10-15             Perform Capillary Blood Glucose checks as needed.  Exercise Prescription Changes:   Exercise Prescription Changes     Row Name 11/09/23 1100             Response to Exercise   Blood Pressure (Admit) 132/68       Blood Pressure (Exercise) 154/74       Blood Pressure (Exit) 142/72       Heart Rate (Admit) 85 bpm       Heart Rate (Exercise) 105 bpm       Heart Rate (Exit) 93 bpm       Oxygen Saturation (Admit) 95 %       Oxygen Saturation (Exercise) 90 %       Oxygen Saturation  (Exit) 95 %       Rating of Perceived Exertion (Exercise) 15       Perceived Dyspnea (Exercise) 4       Symptoms used walker       Comments Results                Exercise Comments:   Exercise Goals and Review:   Exercise Goals     Row Name 11/09/23 1124             Exercise Goals   Increase Physical Activity Yes       Intervention Provide advice, education, support and counseling about physical activity/exercise needs.;Develop an individualized exercise prescription for aerobic and resistive training based on initial evaluation findings, risk stratification, comorbidities and participant's personal goals.       Expected Outcomes Short Term: Attend rehab on a regular basis to increase amount of physical activity.;Long Term: Exercising regularly at least 3-5 days a week.;Long Term: Add in home exercise to make exercise part of routine and to increase amount of physical activity.       Increase Strength and Stamina Yes       Intervention Develop an individualized exercise prescription for aerobic and resistive training based on initial evaluation findings, risk stratification, comorbidities and participant's personal goals.;Provide advice, education, support and counseling about physical activity/exercise needs.       Expected Outcomes Short Term: Increase workloads from initial exercise prescription for resistance, speed, and METs.;Short Term: Perform resistance training exercises routinely during rehab and add in resistance training at home;Long Term: Improve cardiorespiratory fitness, muscular  endurance and strength as measured by increased METs and functional capacity ( )       Able to understand and use rate of perceived exertion (RPE) scale Yes       Intervention Provide education and explanation on how to use RPE scale       Expected Outcomes Short Term: Able to use RPE daily in rehab to express subjective intensity level;Long Term:  Able to use RPE to guide intensity  level when exercising independently       Able to understand and use Dyspnea scale Yes       Intervention Provide education and explanation on how to use Dyspnea scale       Expected Outcomes Short Term: Able to use Dyspnea scale daily in rehab to express subjective sense of shortness of breath during exertion;Long Term: Able to use Dyspnea scale to guide intensity level when exercising independently       Knowledge and understanding of Target Heart Rate Range (THRR) Yes       Intervention Provide education and explanation of THRR including how the numbers were predicted and where they are located for reference       Expected Outcomes Short Term: Able to state/look up THRR;Long Term: Able to use THRR to govern intensity when exercising independently;Short Term: Able to use daily as guideline for intensity in rehab       Able to check pulse independently Yes       Intervention Provide education and demonstration on how to check pulse in carotid and radial arteries.;Review the importance of being able to check your own pulse for safety during independent exercise       Expected Outcomes Short Term: Able to explain why pulse checking is important during independent exercise;Long Term: Able to check pulse independently and accurately       Understanding of Exercise Prescription Yes       Intervention Provide education, explanation, and written materials on patient's individual exercise prescription       Expected Outcomes Short Term: Able to explain program exercise prescription;Long Term: Able to explain home exercise prescription to exercise independently                Exercise Goals Re-Evaluation :   Discharge Exercise Prescription (Final Exercise Prescription Changes):  Exercise Prescription Changes - 11/09/23 1100       Response to Exercise   Blood Pressure (Admit) 132/68    Blood Pressure (Exercise) 154/74    Blood Pressure (Exit) 142/72    Heart Rate (Admit) 85 bpm    Heart Rate  (Exercise) 105 bpm    Heart Rate (Exit) 93 bpm    Oxygen Saturation (Admit) 95 %    Oxygen Saturation (Exercise) 90 %    Oxygen Saturation (Exit) 95 %    Rating of Perceived Exertion (Exercise) 15    Perceived Dyspnea (Exercise) 4    Symptoms used walker    Comments Results             Nutrition:  Target Goals: Understanding of nutrition guidelines, daily intake of sodium 1500mg , cholesterol 200mg , calories 30% from fat and 7% or less from saturated fats, daily to have 5 or more servings of fruits and vegetables.  Education: All About Nutrition: -Group instruction provided by verbal, written material, interactive activities, discussions, models, and posters to present general guidelines for heart healthy nutrition including fat, fiber, MyPlate, the role of sodium in heart healthy nutrition, utilization of the nutrition label, and  utilization of this knowledge for meal planning. Follow up email sent as well. Written material given at graduation.   Biometrics:  Pre Biometrics - 11/09/23 1124       Pre Biometrics   Height 5\' 2"  (1.575 m)    Weight 100 lb 11.2 oz (45.7 kg)    Waist Circumference 26.5 inches    Hip Circumference 33.5 inches    Waist to Hip Ratio 0.79 %    BMI (Calculated) 18.41    Single Leg Stand 1.6 seconds              Nutrition Therapy Plan and Nutrition Goals:  Nutrition Therapy & Goals - 11/09/23 1133       Nutrition Therapy   RD appointment deferred Yes      Intervention Plan   Intervention Prescribe, educate and counsel regarding individualized specific dietary modifications aiming towards targeted core components such as weight, hypertension, lipid management, diabetes, heart failure and other comorbidities.    Expected Outcomes Short Term Goal: Understand basic principles of dietary content, such as calories, fat, sodium, cholesterol and nutrients.;Short Term Goal: A plan has been developed with personal nutrition goals set during  dietitian appointment.;Long Term Goal: Adherence to prescribed nutrition plan.             Nutrition Assessments:  MEDIFICTS Score Key: >=70 Need to make dietary changes  40-70 Heart Healthy Diet <= 40 Therapeutic Level Cholesterol Diet   Picture Your Plate Scores: <40 Unhealthy dietary pattern with much room for improvement. 41-50 Dietary pattern unlikely to meet recommendations for good health and room for improvement. 51-60 More healthful dietary pattern, with some room for improvement.  >60 Healthy dietary pattern, although there may be some specific behaviors that could be improved.   Nutrition Goals Re-Evaluation:   Nutrition Goals Discharge (Final Nutrition Goals Re-Evaluation):   Psychosocial: Target Goals: Acknowledge presence or absence of significant depression and/or stress, maximize coping skills, provide positive support system. Participant is able to verbalize types and ability to use techniques and skills needed for reducing stress and depression.   Education: Stress, Anxiety, and Depression - Group verbal and visual presentation to define topics covered.  Reviews how body is impacted by stress, anxiety, and depression.  Also discusses healthy ways to reduce stress and to treat/manage anxiety and depression.  Written material given at graduation.   Education: Sleep Hygiene -Provides group verbal and written instruction about how sleep can affect your health.  Define sleep hygiene, discuss sleep cycles and impact of sleep habits. Review good sleep hygiene tips.    Initial Review & Psychosocial Screening:  Initial Psych Review & Screening - 11/08/23 1430       Initial Review   Current issues with Current Psychotropic Meds      Family Dynamics   Good Support System? Yes    Comments Brelynn sometimes gets depressed because she cannot breatj at times. She is unable to things that she wants to due to her COPD and she takes Zoloft  to help. She can look to her  husband and grandchildren for support.      Barriers   Psychosocial barriers to participate in program The patient should benefit from training in stress management and relaxation.;Psychosocial barriers identified (see note)      Screening Interventions   Interventions Encouraged to exercise;Provide feedback about the scores to participant;To provide support and resources with identified psychosocial needs    Expected Outcomes Short Term goal: Utilizing psychosocial counselor, staff and physician to assist  with identification of specific Stressors or current issues interfering with healing process. Setting desired goal for each stressor or current issue identified.;Long Term Goal: Stressors or current issues are controlled or eliminated.;Short Term goal: Identification and review with participant of any Quality of Life or Depression concerns found by scoring the questionnaire.;Long Term goal: The participant improves quality of Life and PHQ9 Scores as seen by post scores and/or verbalization of changes             Quality of Life Scores:  Scores of 19 and below usually indicate a poorer quality of life in these areas.  A difference of  2-3 points is a clinically meaningful difference.  A difference of 2-3 points in the total score of the Quality of Life Index has been associated with significant improvement in overall quality of life, self-image, physical symptoms, and general health in studies assessing change in quality of life.  PHQ-9: Review Flowsheet       11/09/2023  Depression screen PHQ 2/9  Decreased Interest 0  Down, Depressed, Hopeless 1  PHQ - 2 Score 1  Altered sleeping 3  Tired, decreased energy 3  Change in appetite 2  Feeling bad or failure about yourself  3  Trouble concentrating 3  Moving slowly or fidgety/restless 3  Suicidal thoughts 2  PHQ-9 Score 20  Difficult doing work/chores Extremely dIfficult   Interpretation of Total Score  Total Score Depression  Severity:  1-4 = Minimal depression, 5-9 = Mild depression, 10-14 = Moderate depression, 15-19 = Moderately severe depression, 20-27 = Severe depression   Psychosocial Evaluation and Intervention:  Psychosocial Evaluation - 11/08/23 1433       Psychosocial Evaluation & Interventions   Interventions Encouraged to exercise with the program and follow exercise prescription;Relaxation education;Stress management education    Comments Kileen sometimes gets depressed because she cannot breatj at times. She is unable to things that she wants to due to her COPD and she takes Zoloft  to help. She can look to her husband and grandchildren for support.    Expected Outcomes Short: Start [facility] to help with mood. Long: Maintain a healthy mental state    Continue Psychosocial Services  Follow up required by staff             Psychosocial Re-Evaluation:   Psychosocial Discharge (Final Psychosocial Re-Evaluation):   Education: Education Goals: Education classes will be provided on a weekly basis, covering required topics. Participant will state understanding/return demonstration of topics presented.  Learning Barriers/Preferences:  Learning Barriers/Preferences - 11/08/23 1430       Learning Barriers/Preferences   Learning Barriers None    Learning Preferences None             General Pulmonary Education Topics:  Infection Prevention: - Provides verbal and written material to individual with discussion of infection control including proper hand washing and proper equipment cleaning during exercise session. Flowsheet Row Pulmonary Rehab from 11/08/2023 in [facility] Cardiac and Pulmonary Rehab  Date 11/08/23  Educator [facility]  Instruction Review Code 1- Verbalizes Understanding       Falls Prevention: - Provides verbal and written material to individual with discussion of falls prevention and safety. Flowsheet Row Pulmonary Rehab from 11/08/2023 in [facility] Cardiac and Pulmonary Rehab  Date  11/08/23  Educator [facility]  Instruction Review Code 1- Verbalizes Understanding       Chronic Lung Disease Review: - Group verbal instruction with posters, models, PowerPoint presentations and videos,  to review new updates, new respiratory medications,  new advancements in procedures and treatments. Providing information on websites and "800" numbers for continued self-education. Includes information about supplement oxygen, available portable oxygen systems, continuous and intermittent flow rates, oxygen safety, concentrators, and Medicare reimbursement for oxygen. Explanation of Pulmonary Drugs, including class, frequency, complications, importance of spacers, rinsing mouth after steroid MDI's, and proper cleaning methods for nebulizers. Review of basic lung anatomy and physiology related to function, structure, and complications of lung disease. Review of risk factors. Discussion about methods for diagnosing sleep apnea and types of masks and machines for OSA. Includes a review of the use of types of environmental controls: home humidity, furnaces, filters, dust mite/pet prevention, HEPA vacuums. Discussion about weather changes, air quality and the benefits of nasal washing. Instruction on Warning signs, infection symptoms, calling MD promptly, preventive modes, and value of vaccinations. Review of effective airway clearance, coughing and/or vibration techniques. Emphasizing that all should Create an Action Plan. Written material given at graduation.   AED/CPR: - Group verbal and written instruction with the use of models to demonstrate the basic use of the AED with the basic ABC's of resuscitation.    Anatomy and Cardiac Procedures: - Group verbal and visual presentation and models provide information about basic cardiac anatomy and function. Reviews the testing methods done to diagnose heart disease and the outcomes of the test results. Describes the treatment choices: Medical Management,  Angioplasty, or Coronary Bypass Surgery for treating various heart conditions including Myocardial Infarction, Angina, Valve Disease, and Cardiac Arrhythmias.  Written material given at graduation.   Medication Safety: - Group verbal and visual instruction to review commonly prescribed medications for heart and lung disease. Reviews the medication, class of the drug, and side effects. Includes the steps to properly store meds and maintain the prescription regimen.  Written material given at graduation.   Other: -Provides group and verbal instruction on various topics (see comments)   Knowledge Questionnaire Score:    Core Components/Risk Factors/Patient Goals at Admission:  Personal Goals and Risk Factors at Admission - 11/08/23 1429       Core Components/Risk Factors/Patient Goals on Admission    Weight Management Yes;Weight Gain    Intervention Weight Management: Develop a combined nutrition and exercise program designed to reach desired caloric intake, while maintaining appropriate intake of nutrient and fiber, sodium and fats, and appropriate energy expenditure required for the weight goal.;Weight Management: Provide education and appropriate resources to help participant work on and attain dietary goals.;Obesity: Provide education and appropriate resources to help participant work on and attain dietary goals.    Expected Outcomes Short Term: Continue to assess and modify interventions until short term weight is achieved;Weight Loss: Understanding of general recommendations for a balanced deficit meal plan, which promotes 1-2 lb weight loss per week and includes a negative energy balance of [phone] kcal/d;Understanding recommendations for meals to include 15-35% energy as protein, 25-35% energy from fat, 35-60% energy from carbohydrates, less than 200mg  of dietary cholesterol, 20-35 gm of total fiber daily;Understanding of distribution of calorie intake throughout the day with the  consumption of 4-5 meals/snacks;Weight Gain: Understanding of general recommendations for a high calorie, high protein meal plan that promotes weight gain by distributing calorie intake throughout the day with the consumption for 4-5 meals, snacks, and/or supplements    Improve shortness of breath with ADL's Yes    Intervention Provide education, individualized exercise plan and daily activity instruction to help decrease symptoms of SOB with activities of daily living.    Expected Outcomes Short  Term: Improve cardiorespiratory fitness to achieve a reduction of symptoms when performing ADLs;Long Term: Be able to perform more ADLs without symptoms or delay the onset of symptoms    Hypertension Yes    Intervention Provide education on lifestyle modifcations including regular physical activity/exercise, weight management, moderate sodium restriction and increased consumption of fresh fruit, vegetables, and low fat dairy, alcohol moderation, and smoking cessation.;Monitor prescription use compliance.    Expected Outcomes Short Term: Continued assessment and intervention until BP is < 140/57mm HG in hypertensive participants. < 130/8mm HG in hypertensive participants with diabetes, heart failure or chronic kidney disease.;Long Term: Maintenance of blood pressure at goal levels.    Lipids Yes    Intervention Provide education and support for participant on nutrition & aerobic/resistive exercise along with prescribed medications to achieve LDL 70mg , HDL >40mg .    Expected Outcomes Short Term: Participant states understanding of desired cholesterol values and is compliant with medications prescribed. Participant is following exercise prescription and nutrition guidelines.;Long Term: Cholesterol controlled with medications as prescribed, with individualized exercise RX and with personalized nutrition plan. Value goals: LDL < 70mg , HDL > 40 mg.             Education:Diabetes - Individual verbal and written  instruction to review signs/symptoms of diabetes, desired ranges of glucose level fasting, after meals and with exercise. Acknowledge that pre and post exercise glucose checks will be done for 3 sessions at entry of program.   Know Your Numbers and Heart Failure: - Group verbal and visual instruction to discuss disease risk factors for cardiac and pulmonary disease and treatment options.  Reviews associated critical values for Overweight/Obesity, Hypertension, Cholesterol, and Diabetes.  Discusses basics of heart failure: signs/symptoms and treatments.  Introduces Heart Failure Zone chart for action plan for heart failure.  Written material given at graduation.   Core Components/Risk Factors/Patient Goals Review:    Core Components/Risk Factors/Patient Goals at Discharge (Final Review):    ITP Comments:  ITP Comments     Row Name 11/08/23 1433 11/09/23 1110         ITP Comments Virtual Visit completed. Patient informed on EP and RD appointment and 6 Minute walk test. Patient also informed of patient health questionnaires on My Chart. Patient Verbalizes understanding. Visit diagnosis can be found in [facility] 10/04/2023. Completed and gym orientation for respiratory care services. Initial ITP created and sent for review to Dr. Faud Aleskerov, Medical Director.               Comments: Initial ITP

## 2023-11-16 ENCOUNTER — Encounter: Admitting: *Deleted

## 2023-11-16 DIAGNOSIS — J449 Chronic obstructive pulmonary disease, unspecified: Secondary | ICD-10-CM

## 2023-11-16 NOTE — Progress Notes (Signed)
 Daily Session Note  Patient Details  Name: Taylor Chambers MRN: [id number] Date of Birth: 11-14-1945 Referring Provider:   Gattis Kass Pulmonary Rehab from 11/09/2023 in [facility] Cardiac and Pulmonary Rehab  Referring Provider Dr. Erskin Hearing, MD    Encounter Date: 11/16/2023  Check In:  Session Check In - 11/16/23 0944       Check-In   Supervising physician immediately available to respond to emergencies See telemetry face sheet for immediately available ER MD    Location [facility]-Cardiac & Pulmonary Rehab    Staff Present Maud Sorenson, RN, BSN, CCRP;Meredith Manson Seitz RN,BSN;Noah Tickle, BS, Exercise Physiologist;Jason Martina Sledge RDN,LDN    Virtual Visit No    Medication changes reported     No    Fall or balance concerns reported    No    Warm-up and Cool-down Performed on first and last piece of equipment    Resistance Training Performed Yes    VAD Patient? No    PAD/SET Patient? No      Pain Assessment   Currently in Pain? No/denies             Social History   Tobacco Use  Smoking Status Former   Current packs/day: 0.00   Average packs/day: 0.3 packs/day for 50.0 years (12.5 ttl pk-yrs)   Types: Cigarettes   Start date: 01/10/1972   Quit date: 01/09/2022   Years since quitting: 1.8  Smokeless Tobacco Never    Goals Met:  Proper associated with RPD/PD & O2 Sat Exercise tolerated well Personal goals reviewed No report of concerns or symptoms today  Goals Unmet:  Not Applicable  Comments: Pt able to follow exercise prescription today without complaint.  Will continue to monitor for progression. First full day of exercise!  Patient was oriented to gym and equipment including functions, settings, policies, and procedures.  Patient's individual exercise prescription and treatment plan were reviewed.  All starting workloads were established based on the results of the 6 minute walk test done at initial orientation visit.  The plan for exercise progression was also introduced  and progression will be customized based on patient's performance and goals.    Dr. Firman Hughes is Medical Director for [facility] Cardiac Rehabilitation.  Dr. Fuad Aleskerov is Medical Director for [facility] Pulmonary Rehabilitation.

## 2023-11-21 ENCOUNTER — Encounter: Admitting: *Deleted

## 2023-11-21 DIAGNOSIS — J449 Chronic obstructive pulmonary disease, unspecified: Secondary | ICD-10-CM | POA: Diagnosis not present

## 2023-11-21 NOTE — Progress Notes (Signed)
 Daily Session Note  Patient Details  Name: Taylor Chambers MRN: [id number] Date of Birth: 12/22/1945 Referring Provider:   Gattis Kass Pulmonary Rehab from 11/09/2023 in [facility] Cardiac and Pulmonary Rehab  Referring Provider Dr. Erskin Hearing, MD    Encounter Date: 11/21/2023  Check In:  Session Check In - 11/21/23 0945       Check-In   Supervising physician immediately available to respond to emergencies See telemetry face sheet for immediately available ER MD    Location [facility]-Cardiac & Pulmonary Rehab    Staff Present Maud Sorenson, RN, BSN, CCRP;Maxon Conetta BS, Exercise Physiologist;Noah Tickle, BS, Exercise Physiologist;Jason Martina Sledge RDN,LDN    Virtual Visit No    Medication changes reported     No    Fall or balance concerns reported    No    Warm-up and Cool-down Performed on first and last piece of equipment    Resistance Training Performed Yes    VAD Patient? No    PAD/SET Patient? No      Pain Assessment   Currently in Pain? No/denies             Social History   Tobacco Use  Smoking Status Former   Current packs/day: 0.00   Average packs/day: 0.3 packs/day for 50.0 years (12.5 ttl pk-yrs)   Types: Cigarettes   Start date: 01/10/1972   Quit date: 01/09/2022   Years since quitting: 1.8  Smokeless Tobacco Never    Goals Met:  Proper associated with RPD/PD & O2 Sat Independence with exercise equipment Exercise tolerated well No report of concerns or symptoms today  Goals Unmet:  Not Applicable  Comments: Pt able to follow exercise prescription today without complaint.  Will continue to monitor for progression.    Dr. Firman Hughes is Medical Director for [facility] Cardiac Rehabilitation.  Dr. Fuad Aleskerov is Medical Director for [facility] Pulmonary Rehabilitation.

## 2023-11-23 ENCOUNTER — Encounter: Admitting: *Deleted

## 2023-11-23 DIAGNOSIS — J449 Chronic obstructive pulmonary disease, unspecified: Secondary | ICD-10-CM | POA: Diagnosis not present

## 2023-11-23 NOTE — Progress Notes (Signed)
 Daily Session Note  Patient Details  Name: Taylor Chambers MRN: [id number] Date of Birth: 1945/08/21 Referring Provider:   Gattis Kass Pulmonary Rehab from 11/09/2023 in [facility] Cardiac and Pulmonary Rehab  Referring Provider Dr. Erskin Hearing, MD    Encounter Date: 11/23/2023  Check In:  Session Check In - 11/23/23 0956       Check-In   Supervising physician immediately available to respond to emergencies See telemetry face sheet for immediately available ER MD    Location [facility]-Cardiac & Pulmonary Rehab    Staff Present Maud Sorenson, RN, BSN, CCRP;Joseph Hood RCP,RRT,BSRT;Maxon [place] BS, Exercise Physiologist;Krista Spencer RN,BSN    Virtual Visit No    Medication changes reported     No    Fall or balance concerns reported    No    Warm-up and Cool-down Performed on first and last piece of equipment    Resistance Training Performed Yes    VAD Patient? No    PAD/SET Patient? No      Pain Assessment   Currently in Pain? No/denies             Social History   Tobacco Use  Smoking Status Former   Current packs/day: 0.00   Average packs/day: 0.3 packs/day for 50.0 years (12.5 ttl pk-yrs)   Types: Cigarettes   Start date: 01/10/1972   Quit date: 01/09/2022   Years since quitting: 1.8  Smokeless Tobacco Never    Goals Met:  Proper associated with RPD/PD & O2 Sat Independence with exercise equipment Exercise tolerated well No report of concerns or symptoms today  Goals Unmet:  Not Applicable  Comments: Pt able to follow exercise prescription today without complaint.  Will continue to monitor for progression.    Dr. Firman Hughes is Medical Director for [facility] Cardiac Rehabilitation.  Dr. Fuad Aleskerov is Medical Director for [facility] Pulmonary Rehabilitation.

## 2023-11-27 ENCOUNTER — Ambulatory Visit: Admitting: Student

## 2023-11-28 ENCOUNTER — Encounter: Admitting: *Deleted

## 2023-11-28 ENCOUNTER — Other Ambulatory Visit: Payer: Self-pay | Admitting: Cardiovascular Disease

## 2023-11-28 DIAGNOSIS — J449 Chronic obstructive pulmonary disease, unspecified: Secondary | ICD-10-CM

## 2023-11-28 DIAGNOSIS — I480 Paroxysmal atrial fibrillation: Secondary | ICD-10-CM

## 2023-11-28 NOTE — Telephone Encounter (Signed)
 Eliquis  5mg  refill request received. Patient is 78 years old, weight-45.7kg, Crea-0.94 on 09/03/23, Diagnosis-Afib, and last seen by Ryann Dunn on 09/19/23. Dose is appropriate based on dosing criteria. Will send in refill to requested pharmacy.

## 2023-11-28 NOTE — Progress Notes (Signed)
 Daily Session Note  Patient Details  Name: Taylor Chambers MRN: [id number] Date of Birth: 1945-12-20 Referring Provider:   Conrad Ports Pulmonary Rehab from 11/09/2023 in [facility] Cardiac and Pulmonary Rehab  Referring Provider Dr. Halina Picking, MD    Encounter Date: 11/28/2023  Check In:  Session Check In - 11/28/23 0927       Check-In   Supervising physician immediately available to respond to emergencies See telemetry face sheet for immediately available ER MD    Location [facility]-Cardiac & Pulmonary Rehab    Staff Present Othel Durand, RN, BSN, CCRP;Noah Tickle, BS, Exercise Physiologist;Maxon Conetta BS, Exercise Physiologist;Jason Elnor RDN,LDN    Virtual Visit No    Medication changes reported     No    Fall or balance concerns reported    No    Warm-up and Cool-down Performed on first and last piece of equipment    Resistance Training Performed Yes    VAD Patient? No    PAD/SET Patient? No      Pain Assessment   Currently in Pain? No/denies             Social History   Tobacco Use  Smoking Status Former   Current packs/day: 0.00   Average packs/day: 0.3 packs/day for 50.0 years (12.5 ttl pk-yrs)   Types: Cigarettes   Start date: 01/10/1972   Quit date: 01/09/2022   Years since quitting: 1.8  Smokeless Tobacco Never    Goals Met:  Proper associated with RPD/PD & O2 Sat Independence with exercise equipment Exercise tolerated well No report of concerns or symptoms today  Goals Unmet:  Not Applicable  Comments: Pt able to follow exercise prescription today without complaint.  Will continue to monitor for progression.    Dr. Oneil Pinal is Medical Director for [facility] Cardiac Rehabilitation.  Dr. Fuad Aleskerov is Medical Director for [facility] Pulmonary Rehabilitation.

## 2023-11-30 ENCOUNTER — Encounter: Admitting: *Deleted

## 2023-11-30 DIAGNOSIS — J449 Chronic obstructive pulmonary disease, unspecified: Secondary | ICD-10-CM | POA: Diagnosis not present

## 2023-11-30 NOTE — Progress Notes (Signed)
 Daily Session Note  Patient Details  Name: Taylor Chambers MRN: [id number] Date of Birth: 11/15/1945 Referring Provider:   Conrad Ports Pulmonary Rehab from 11/09/2023 in [facility] Cardiac and Pulmonary Rehab  Referring Provider Dr. Halina Picking, MD    Encounter Date: 11/30/2023  Check In:  Session Check In - 11/30/23 0951       Check-In   Supervising physician immediately available to respond to emergencies See telemetry face sheet for immediately available ER MD    Location [facility]-Cardiac & Pulmonary Rehab    Staff Present Othel Durand, RN, BSN, CCRP;Joseph Hood RCP,RRT,BSRT;Maxon [place] BS, Exercise Physiologist;Noah Tickle, BS, Exercise Physiologist    Virtual Visit No    Medication changes reported     No    Fall or balance concerns reported    No    Warm-up and Cool-down Performed on first and last piece of equipment    Resistance Training Performed Yes    VAD Patient? No    PAD/SET Patient? No      Pain Assessment   Currently in Pain? No/denies             Social History   Tobacco Use  Smoking Status Former   Current packs/day: 0.00   Average packs/day: 0.3 packs/day for 50.0 years (12.5 ttl pk-yrs)   Types: Cigarettes   Start date: 01/10/1972   Quit date: 01/09/2022   Years since quitting: 1.8  Smokeless Tobacco Never    Goals Met:  Proper associated with RPD/PD & O2 Sat Independence with exercise equipment Exercise tolerated well No report of concerns or symptoms today  Goals Unmet:  Not Applicable  Comments: Pt able to follow exercise prescription today without complaint.  Will continue to monitor for progression.    Dr. Oneil Pinal is Medical Director for [facility] Cardiac Rehabilitation.  Dr. Fuad Aleskerov is Medical Director for [facility] Pulmonary Rehabilitation.

## 2023-12-05 ENCOUNTER — Encounter

## 2023-12-06 ENCOUNTER — Encounter: Payer: Self-pay | Admitting: *Deleted

## 2023-12-06 DIAGNOSIS — J449 Chronic obstructive pulmonary disease, unspecified: Secondary | ICD-10-CM

## 2023-12-06 NOTE — Progress Notes (Signed)
 Pulmonary Individual Treatment Plan  Patient Details  Name: Taylor Chambers MRN: [id number] Date of Birth: Dec 22, 1945 Referring Provider:   [facility] Rehab from 11/09/2023 in [facility] Card[facility] Pul[facility] Rehab  Referring Provider Dr. Halina Picking, MD    Initial Encounter Date:  Flowsheet Row Pulmonary Rehab from 11/09/2023 in [facility] Cardiac and Pulmonary Rehab  Date 11/09/23    Visit Diagnosis: Chronic obstructive pulmonary disease, unspecified COPD type (HCC)  Patient's Home Medications on Admission:  Current Outpatient Medications:    albuterol  (VENTOLIN  HFA) 108 (90 Base) MCG/ACT inhaler, Inhale 2 puffs into the lungs every 6 (six) hours as needed for wheezing or shortness of breath., Disp: 8 g, Rfl: 2   apixaban  (ELIQUIS ) 5 MG TABS tablet, TAKE 1 TABLET BY MOUTH TWICE A DAY, Disp: 180 tablet, Rfl: 1   budesonide  (PULMICORT ) 0.25 MG/2ML nebulizer solution, SMARTSIG:2 Milliliter(s) Twice Daily, Disp: , Rfl:    Calcium Carbonate (CALCIUM 500 PO), Take 500 mg by mouth daily at 12 noon., Disp: , Rfl:    Cholecalciferol  (VITAMIN D3) 1.25 MG (50000 UT) CAPS, Take 5,000 Units by mouth daily in the afternoon., Disp: , Rfl:    COMBIVENT  RESPIMAT 20-100 MCG/ACT AERS respimat, Inhale into the lungs., Disp: , Rfl:    cyclobenzaprine (FLEXERIL) 10 MG tablet, Take 10 mg by mouth 2 (two) times daily as needed for muscle spasms., Disp: , Rfl:    divalproex  (DEPAKOTE ) 250 MG DR tablet, Take 1 tablet (250 mg total) by mouth 2 (two) times daily. (Patient not taking: Reported on 11/08/2023), Disp: 60 tablet, Rfl: 0   divalproex  (DEPAKOTE ) 250 MG DR tablet, Take 250 mg by mouth., Disp: , Rfl:    Dupilumab  300 MG/2ML SOAJ, Inject 300 mg into the skin every 14 (fourteen) days. Saturday, Disp: , Rfl:    DUPIXENT  300 MG/2ML prefilled syringe, Inject 300 mg into the skin., Disp: , Rfl:    EPINEPHrine 0.3 mg/0.3 mL IJ SOAJ injection, Inject 0.3 mg into the muscle as needed for anaphylaxis., Disp: ,  Rfl:    furosemide  (LASIX ) 40 MG tablet, Take 1 tablet (40 mg total) by mouth daily., Disp: 30 tablet, Rfl: 0   ipratropium (ATROVENT ) 0.06 % nasal spray, Place 1 spray into both nostrils daily., Disp: , Rfl:    ipratropium-albuterol  (DUONEB) 0.5-2.5 (3) MG/3ML SOLN, Take 3 mLs by nebulization 2 (two) times daily. Mix with budesonide  (Patient not taking: Reported on 11/08/2023), Disp: , Rfl:    ipratropium-albuterol  (DUONEB) 0.5-2.5 (3) MG/3ML SOLN, Inhale 3 mLs into the lungs., Disp: , Rfl:    losartan  (COZAAR ) 100 MG tablet, Take 1 tablet (100 mg total) by mouth daily., Disp: 90 tablet, Rfl: 3   metoprolol  succinate (TOPROL -XL) 25 MG 24 hr tablet, Take 1 tablet (25 mg total) by mouth 2 (two) times daily., Disp: 60 tablet, Rfl: 0   Multiple Vitamin (MULTI-VITAMIN) tablet, Take 1 tablet by mouth daily., Disp: , Rfl:    Multiple Vitamins-Minerals (PRESERVISION AREDS 2+MULTI VIT PO), Take 2 tablets by mouth daily. (Patient not taking: Reported on 11/08/2023), Disp: , Rfl:    OHTUVAYRE  3 MG/2.5ML SUSP, Take 3 mg by nebulization daily., Disp: , Rfl:    omeprazole (PRILOSEC) 40 MG capsule, Take 40 mg by mouth daily as needed (Heartburn)., Disp: , Rfl:    oxybutynin (DITROPAN) 5 MG tablet, Take 5 mg by mouth., Disp: , Rfl:    Potassium Chloride  40 MEQ/15ML (20%) SOLN, Take 20 mEq by mouth., Disp: , Rfl:    potassium  chloride SA (KLOR-CON  M) 20 MEQ tablet, Take 1 tablet (20 mEq total) by mouth daily., Disp: 90 tablet, Rfl: 3   pravastatin  (PRAVACHOL ) 10 MG tablet, Take 10 mg by mouth daily., Disp: , Rfl:    sertraline  (ZOLOFT ) 100 MG tablet, Take 1 tablet (100 mg total) by mouth daily., Disp: 30 tablet, Rfl: 11   sulfamethoxazole -trimethoprim  (BACTRIM ) 400-80 MG tablet, Take 1 tablet by mouth 3 (three) times a week., Disp: , Rfl:   Past Medical History: Past Medical History:  Diagnosis Date   Allergies    Anxiety    Aortic atherosclerosis (HCC)    Bipolar affective disorder (HCC)    COPD (chronic  obstructive pulmonary disease) (HCC)    Coronary artery calcification seen on CT scan    Depression    Diastolic dysfunction 02/24/2020   a.) TTE 02/24/2020: EF >55%; triv PR, mild TR, mod MR; G1DD.   DOE (dyspnea on exertion)    Emphysema lung (HCC)    History of cataract    HLD (hyperlipidemia)    Hypertension    Macular degeneration    Migraines    Osteoporosis    Pneumonia    Tobacco use     Tobacco Use: Social History   Tobacco Use  Smoking Status Former   Current packs/day: 0.00   Average packs/day: 0.3 packs/day for 50.0 years (12.5 ttl pk-yrs)   Types: Cigarettes   Start date: 01/10/1972   Quit date: 01/09/2022   Years since quitting: 1.9  Smokeless Tobacco Never    Labs: Review Flowsheet  More data exists      Latest Ref Rng & Units 06/27/2023 06/29/2023 07/02/2023 07/17/2023 08/21/2023  Labs for ITP Cardiac and Pulmonary Rehab  PH, Arterial 7.35 - 7.45 7.44  - 7.6  7.4  -  PCO2 arterial 32 - 48 mmHg 46  - 47  33  -  Bicarbonate 20.0 - 28.0 mmol/L 31.2  26.8  26.6  46.1  20.4  25.0   Acid-base deficit 0.0 - 2.0 mmol/L - 0.1  4.5  - 3.6  3.1   O2 Saturation % 99.9  95  55.1  97.1  89.2  90.4     Details       Multiple values from one day are sorted in reverse-chronological order          Pulmonary Assessment Scores:  Pulmonary Assessment Scores     Row Name 11/09/23 1110 11/16/23 0936       ADL UCSD   ADL Phase -- Entry    SOB Score total -- 53    Rest -- 1    Walk -- 0    Stairs -- 5    Bath -- 0    Dress -- 0    Shop -- 2      CAT Score   CAT Score -- 19      mMRC Score   mMRC Score 2 2       UCSD: Self-administered rating of dyspnea associated with activities of daily living (ADLs) 6-point scale (0 = not at all to 5 = maximal or unable to do because of breathlessness)  Scoring Scores range from 0 to 120.  Minimally important difference is 5 units  CAT: CAT can identify the health impairment of COPD patients and is better  correlated with disease progression.  CAT has a scoring range of zero to 40. The CAT score is classified into four groups of low (less than 10), medium (10 - 20),  high (21-30) and very high (31-40) based on the impact level of disease on health status. A CAT score over 10 suggests significant symptoms.  A worsening CAT score could be explained by an exacerbation, poor medication adherence, poor inhaler technique, or progression of COPD or comorbid conditions.  CAT MCID is 2 points  mMRC: mMRC (Modified Medical Research Council) Dyspnea Scale is used to assess the degree of baseline functional disability in patients of respiratory disease due to dyspnea. No minimal important difference is established. A decrease in score of 1 point or greater is considered a positive change.   Pulmonary Function Assessment:  Pulmonary Function Assessment - 11/08/23 1429       Breath   Shortness of Breath Yes;Fear of Shortness of Breath;Limiting activity;Panic with Shortness of Breath          Exercise Target Goals: Exercise Program Goal: Individual exercise prescription set using results from initial 6 min walk test and THRR while considering  patient's activity barriers and safety.   Exercise Prescription Goal: Initial exercise prescription builds to 30-45 minutes a day of aerobic activity, 2-3 days per week.  Home exercise guidelines will be given to patient during program as part of exercise prescription that the participant will acknowledge.  Education: Aerobic Exercise: - Group verbal and visual presentation on the components of exercise prescription. Introduces F.I.T.T principle from ACSM for exercise prescriptions.  Reviews F.I.T.T. principles of aerobic exercise including progression. Written material given at graduation.   Education: Resistance Exercise: - Group verbal and visual presentation on the components of exercise prescription. Introduces F.I.T.T principle from ACSM for exercise  prescriptions  Reviews F.I.T.T. principles of resistance exercise including progression. Written material given at graduation.    Education: Exercise & Equipment Safety: - Individual verbal instruction and demonstration of equipment use and safety with use of the equipment. Flowsheet Row Pulmonary Rehab from 11/08/2023 in [facility] Cardiac and Pulmonary Rehab  Date 11/08/23  Educator [facility]  Instruction Review Code 1- Verbalizes Understanding    Education: Exercise Physiology & General Exercise Guidelines: - Group verbal and written instruction with models to review the exercise physiology of the cardiovascular system and associated critical values. Provides general exercise guidelines with specific guidelines to those with heart or lung disease.    Education: Flexibility, Balance, Mind/Body Relaxation: - Group verbal and visual presentation with interactive activity on the components of exercise prescription. Introduces F.I.T.T principle from ACSM for exercise prescriptions. Reviews F.I.T.T. principles of flexibility and balance exercise training including progression. Also discusses the mind body connection.  Reviews various relaxation techniques to help reduce and manage stress (i.e. Deep breathing, progressive muscle relaxation, and visualization). Balance handout provided to take home. Written material given at graduation.   Activity Barriers & Risk Stratification:  Activity Barriers & Cardiac Risk Stratification - 11/09/23 1123       Activity Barriers & Cardiac Risk Stratification   Activity Barriers Right Hip Replacement;Other (comment);Assistive Device;Shortness of Breath;History of Falls;Balance Concerns    Comments osteoporosis          6 Minute Walk:  6 Minute Walk     Row Name 11/09/23 1117         6 Minute Walk   Phase Initial     Distance 455 feet     Walk Time 6 minutes     # of Rest Breaks 0     MPH 0.86     METS 1.68     RPE 15     Perceived Dyspnea  4     VO2  Peak 5.88     Symptoms Yes (comment)     Comments used walker     Resting HR 85 bpm     Resting BP 132/68     Resting Oxygen Saturation  95 %     Exercise Oxygen Saturation  during 6 min walk 90 %     Max Ex. HR 105 bpm     Max Ex. BP 154/74     2 Minute Post BP 142/72       Interval HR   1 Minute HR 102     2 Minute HR 105     3 Minute HR 104     4 Minute HR 105     5 Minute HR 101     6 Minute HR 98     2 Minute Post HR 93     Interval Heart Rate? Yes       Interval Oxygen   Interval Oxygen? Yes     Baseline Oxygen Saturation % 95 %     1 Minute Oxygen Saturation % 91 %     1 Minute Liters of Oxygen 3 L  pulsed     2 Minute Oxygen Saturation % 92 %     2 Minute Liters of Oxygen 3 L     3 Minute Oxygen Saturation % 91 %     3 Minute Liters of Oxygen 3 L     4 Minute Oxygen Saturation % 90 %     4 Minute Liters of Oxygen 3 L     5 Minute Oxygen Saturation % 91 %     5 Minute Liters of Oxygen 3 L     6 Minute Oxygen Saturation % 91 %     6 Minute Liters of Oxygen 3 L     2 Minute Post Oxygen Saturation % 95 %     2 Minute Post Liters of Oxygen 3 L       Oxygen Initial Assessment:  Oxygen Initial Assessment - 11/08/23 1428       Home Oxygen   Home Oxygen Device None    Sleep Oxygen Prescription Continuous;BiPAP    Liters per minute 3    Home Exercise Oxygen Prescription None    Home Resting Oxygen Prescription None    Compliance with Home Oxygen Use Yes      Initial 6 min Walk   Oxygen Used None      Program Oxygen Prescription   Program Oxygen Prescription None      Intervention   Short Term Goals To learn and demonstrate proper pursed lip breathing techniques or other breathing techniques. ;To learn and understand importance of monitoring SPO2 with pulse oximeter and demonstrate accurate use of the pulse oximeter.;To learn and exhibit compliance with exercise, home and travel O2 prescription;To learn and understand importance of maintaining oxygen  saturations>88%    Long  Term Goals Verbalizes importance of monitoring SPO2 with pulse oximeter and return demonstration;Exhibits proper breathing techniques, such as pursed lip breathing or other method taught during program session;Demonstrates proper use of MDI's;Exhibits compliance with exercise, home  and travel O2 prescription;Maintenance of O2 saturations>88%;Compliance with respiratory medication          Oxygen Re-Evaluation:  Oxygen Re-Evaluation     Row Name 11/16/23 0946             Program Oxygen Prescription   Program Oxygen Prescription None  Home Oxygen   Home Oxygen Device None       Sleep Oxygen Prescription Continuous;BiPAP       Liters per minute 3       Home Exercise Oxygen Prescription None       Home Resting Oxygen Prescription None       Compliance with Home Oxygen Use Yes         Goals/Expected Outcomes   Short Term Goals To learn and demonstrate proper pursed lip breathing techniques or other breathing techniques.        Long  Term Goals Exhibits proper breathing techniques, such as pursed lip breathing or other method taught during program session       Comments Reviewed PLB technique with pt.  Talked about how it works and it's importance in maintaining their exercise saturations.       Goals/Expected Outcomes Short: Become more profiecient at using PLB. Long: Become independent at using PLB.          Oxygen Discharge (Final Oxygen Re-Evaluation):  Oxygen Re-Evaluation - 11/16/23 0946       Program Oxygen Prescription   Program Oxygen Prescription None      Home Oxygen   Home Oxygen Device None    Sleep Oxygen Prescription Continuous;BiPAP    Liters per minute 3    Home Exercise Oxygen Prescription None    Home Resting Oxygen Prescription None    Compliance with Home Oxygen Use Yes      Goals/Expected Outcomes   Short Term Goals To learn and demonstrate proper pursed lip breathing techniques or other breathing techniques.      Long  Term Goals Exhibits proper breathing techniques, such as pursed lip breathing or other method taught during program session    Comments Reviewed PLB technique with pt.  Talked about how it works and it's importance in maintaining their exercise saturations.    Goals/Expected Outcomes Short: Become more profiecient at using PLB. Long: Become independent at using PLB.          Initial Exercise Prescription:  Initial Exercise Prescription - 11/09/23 1100       Date of Initial Exercise RX and Referring Provider   Date 11/09/23    Referring Provider Dr. Fuad Aleskerov, MD      Oxygen   Oxygen Continuous    Liters 3    Maintain Oxygen Saturation 88% or higher      Recumbant Bike   Level 1    RPM 50    Watts 15    Minutes 15    METs 1.68      NuStep   Level 1    SPM 80    Minutes 15    METs 1.68      Track   Laps 12    Minutes 15    METs 1.65      Prescription Details   Frequency (times per week) 2    Duration Progress to 30 minutes of continuous aerobic without signs/symptoms of physical distress      Intensity   THRR 40-80% of Max Heartrate 108-132    Ratings of Perceived Exertion 11-13    Perceived Dyspnea 0-4      Progression   Progression Continue to progress workloads to maintain intensity without signs/symptoms of physical distress.      Resistance Training   Training Prescription Yes    Weight 2 lb    Reps 10-15          Perform  Capillary Blood Glucose checks as needed.  Exercise Prescription Changes:   Exercise Prescription Changes     Row Name 11/09/23 1100 11/21/23 1400           Response to Exercise   Blood Pressure (Admit) 132/68 132/70      Blood Pressure (Exercise) 154/74 148/68      Blood Pressure (Exit) 142/72 126/68      Heart Rate (Admit) 85 bpm 74 bpm      Heart Rate (Exercise) 105 bpm 98 bpm      Heart Rate (Exit) 93 bpm 90 bpm      Oxygen Saturation (Admit) 95 % 98 %      Oxygen Saturation (Exercise) 90 % 92 %       Oxygen Saturation (Exit) 95 % 93 %      Rating of Perceived Exertion (Exercise) 15 15      Perceived Dyspnea (Exercise) 4 3      Symptoms used walker none      Comments Results 1st day of exercise      Duration -- Continue with 30 min of aerobic exercise without signs/symptoms of physical distress.      Intensity -- THRR unchanged        Progression   Progression -- Continue to progress workloads to maintain intensity without signs/symptoms of physical distress.      Average METs -- 2.4        Resistance Training   Training Prescription -- Yes      Weight -- 2 lb      Reps -- 10-15        Interval Training   Interval Training -- No        Oxygen   Oxygen -- Continuous      Liters -- 2        Recumbant Bike   Level -- 1      Watts -- 15      Minutes -- 15      METs -- 2.99        NuStep   Level -- 1      Minutes -- 15      METs -- 1.8        Oxygen   Maintain Oxygen Saturation -- 88% or higher         Exercise Comments:   Exercise Comments     Row Name 11/16/23 0945           Exercise Comments First full day of exercise!  Patient was oriented to gym and equipment including functions, settings, policies, and procedures.  Patient's individual exercise prescription and treatment plan were reviewed.  All starting workloads were established based on the results of the 6 minute walk test done at initial orientation visit.  The plan for exercise progression was also introduced and progression will be customized based on patient's performance and goals.          Exercise Goals and Review:   Exercise Goals     Row Name 11/09/23 1124             Exercise Goals   Increase Physical Activity Yes       Intervention Provide advice, education, support and counseling about physical activity/exercise needs.;Develop an individualized exercise prescription for aerobic and resistive training based on initial evaluation findings, risk stratification, comorbidities and  participant's personal goals.       Expected Outcomes Short Term: Attend rehab on a regular basis to increase  amount of physical activity.;Long Term: Exercising regularly at least 3-5 days a week.;Long Term: Add in home exercise to make exercise part of routine and to increase amount of physical activity.       Increase Strength and Stamina Yes       Intervention Develop an individualized exercise prescription for aerobic and resistive training based on initial evaluation findings, risk stratification, comorbidities and participant's personal goals.;Provide advice, education, support and counseling about physical activity/exercise needs.       Expected Outcomes Short Term: Increase workloads from initial exercise prescription for resistance, speed, and METs.;Short Term: Perform resistance training exercises routinely during rehab and add in resistance training at home;Long Term: Improve cardiorespiratory fitness, muscular endurance and strength as measured by increased METs and functional capacity ( )       Able to understand and use rate of perceived exertion (RPE) scale Yes       Intervention Provide education and explanation on how to use RPE scale       Expected Outcomes Short Term: Able to use RPE daily in rehab to express subjective intensity level;Long Term:  Able to use RPE to guide intensity level when exercising independently       Able to understand and use Dyspnea scale Yes       Intervention Provide education and explanation on how to use Dyspnea scale       Expected Outcomes Short Term: Able to use Dyspnea scale daily in rehab to express subjective sense of shortness of breath during exertion;Long Term: Able to use Dyspnea scale to guide intensity level when exercising independently       Knowledge and understanding of Target Heart Rate Range (THRR) Yes       Intervention Provide education and explanation of THRR including how the numbers were predicted and where they are located for  reference       Expected Outcomes Short Term: Able to state/look up THRR;Long Term: Able to use THRR to govern intensity when exercising independently;Short Term: Able to use daily as guideline for intensity in rehab       Able to check pulse independently Yes       Intervention Provide education and demonstration on how to check pulse in carotid and radial arteries.;Review the importance of being able to check your own pulse for safety during independent exercise       Expected Outcomes Short Term: Able to explain why pulse checking is important during independent exercise;Long Term: Able to check pulse independently and accurately       Understanding of Exercise Prescription Yes       Intervention Provide education, explanation, and written materials on patient's individual exercise prescription       Expected Outcomes Short Term: Able to explain program exercise prescription;Long Term: Able to explain home exercise prescription to exercise independently          Exercise Goals Re-Evaluation :  Exercise Goals Re-Evaluation     Row Name 11/16/23 0945 11/21/23 1450           Exercise Goal Re-Evaluation   Exercise Goals Review Able to understand and use rate of perceived exertion (RPE) scale;Knowledge and understanding of Target Heart Rate Range (THRR);Able to understand and use Dyspnea scale;Understanding of Exercise Prescription Increase Physical Activity;Understanding of Exercise Prescription;Increase Strength and Stamina      Comments eviewed RPE and dyspnea scale, THR and program prescription with pt today.  Pt voiced understanding and was given a copy of goals to take  home. Taylor Chambers is off to a good start in the program and completed her first day in this review. She tolerated her exercise prescription well with level 1 on the T4 nustep and level 1 on the recumbent bike. She used 2 lb weights for resistance. We will continue to monitor her progress in the program.      Expected Outcomes  Short: Use RPE daily to regulate intensity. Long: Follow program prescription in THR. Short: Continue to follow current exercise prescription. Long: Continue exercise to improve strength and stamina.         Discharge Exercise Prescription (Final Exercise Prescription Changes):  Exercise Prescription Changes - 11/21/23 1400       Response to Exercise   Blood Pressure (Admit) 132/70    Blood Pressure (Exercise) 148/68    Blood Pressure (Exit) 126/68    Heart Rate (Admit) 74 bpm    Heart Rate (Exercise) 98 bpm    Heart Rate (Exit) 90 bpm    Oxygen Saturation (Admit) 98 %    Oxygen Saturation (Exercise) 92 %    Oxygen Saturation (Exit) 93 %    Rating of Perceived Exertion (Exercise) 15    Perceived Dyspnea (Exercise) 3    Symptoms none    Comments 1st day of exercise    Duration Continue with 30 min of aerobic exercise without signs/symptoms of physical distress.    Intensity THRR unchanged      Progression   Progression Continue to progress workloads to maintain intensity without signs/symptoms of physical distress.    Average METs 2.4      Resistance Training   Training Prescription Yes    Weight 2 lb    Reps 10-15      Interval Training   Interval Training No      Oxygen   Oxygen Continuous    Liters 2      Recumbant Bike   Level 1    Watts 15    Minutes 15    METs 2.99      NuStep   Level 1    Minutes 15    METs 1.8      Oxygen   Maintain Oxygen Saturation 88% or higher          Nutrition:  Target Goals: Understanding of nutrition guidelines, daily intake of sodium 1500mg , cholesterol 200mg , calories 30% from fat and 7% or less from saturated fats, daily to have 5 or more servings of fruits and vegetables.  Education: All About Nutrition: -Group instruction provided by verbal, written material, interactive activities, discussions, models, and posters to present general guidelines for heart healthy nutrition including fat, fiber, MyPlate, the role of  sodium in heart healthy nutrition, utilization of the nutrition label, and utilization of this knowledge for meal planning. Follow up email sent as well. Written material given at graduation.   Biometrics:  Pre Biometrics - 11/09/23 1124       Pre Biometrics   Height 5' 2 (1.575 m)    Weight 100 lb 11.2 oz (45.7 kg)    Waist Circumference 26.5 inches    Hip Circumference 33.5 inches    Waist to Hip Ratio 0.79 %    BMI (Calculated) 18.41    Single Leg Stand 1.6 seconds           Nutrition Therapy Plan and Nutrition Goals:  Nutrition Therapy & Goals - 11/09/23 1133       Nutrition Therapy   RD appointment deferred Yes  Intervention Plan   Intervention Prescribe, educate and counsel regarding individualized specific dietary modifications aiming towards targeted core components such as weight, hypertension, lipid management, diabetes, heart failure and other comorbidities.    Expected Outcomes Short Term Goal: Understand basic principles of dietary content, such as calories, fat, sodium, cholesterol and nutrients.;Short Term Goal: A plan has been developed with personal nutrition goals set during dietitian appointment.;Long Term Goal: Adherence to prescribed nutrition plan.          Nutrition Assessments:  MEDIFICTS Score Key: >=70 Need to make dietary changes  40-70 Heart Healthy Diet <= 40 Therapeutic Level Cholesterol Diet  Flowsheet Row Pulmonary Rehab from 11/16/2023 in [facility] Cardiac and Pulmonary Rehab  Picture Your Plate Total Score on Admission 53   Picture Your Plate Scores: <59 Unhealthy dietary pattern with much room for improvement. 41-50 Dietary pattern unlikely to meet recommendations for good health and room for improvement. 51-60 More healthful dietary pattern, with some room for improvement.  >60 Healthy dietary pattern, although there may be some specific behaviors that could be improved.   Nutrition Goals Re-Evaluation:   Nutrition Goals  Discharge (Final Nutrition Goals Re-Evaluation):   Psychosocial: Target Goals: Acknowledge presence or absence of significant depression and/or stress, maximize coping skills, provide positive support system. Participant is able to verbalize types and ability to use techniques and skills needed for reducing stress and depression.   Education: Stress, Anxiety, and Depression - Group verbal and visual presentation to define topics covered.  Reviews how body is impacted by stress, anxiety, and depression.  Also discusses healthy ways to reduce stress and to treat/manage anxiety and depression.  Written material given at graduation.   Education: Sleep Hygiene -Provides group verbal and written instruction about how sleep can affect your health.  Define sleep hygiene, discuss sleep cycles and impact of sleep habits. Review good sleep hygiene tips.    Initial Review & Psychosocial Screening:  Initial Psych Review & Screening - 11/08/23 1430       Initial Review   Current issues with Current Psychotropic Meds      Family Dynamics   Good Support System? Yes    Comments Anyiah sometimes gets depressed because she cannot breatj at times. She is unable to things that she wants to due to her COPD and she takes Zoloft  to help. She can look to her husband and grandchildren for support.      Barriers   Psychosocial barriers to participate in program The patient should benefit from training in stress management and relaxation.;Psychosocial barriers identified (see note)      Screening Interventions   Interventions Encouraged to exercise;Provide feedback about the scores to participant;To provide support and resources with identified psychosocial needs    Expected Outcomes Short Term goal: Utilizing psychosocial counselor, staff and physician to assist with identification of specific Stressors or current issues interfering with healing process. Setting desired goal for each stressor or current issue  identified.;Long Term Goal: Stressors or current issues are controlled or eliminated.;Short Term goal: Identification and review with participant of any Quality of Life or Depression concerns found by scoring the questionnaire.;Long Term goal: The participant improves quality of Life and PHQ9 Scores as seen by post scores and/or verbalization of changes          Quality of Life Scores:  Scores of 19 and below usually indicate a poorer quality of life in these areas.  A difference of  2-3 points is a clinically meaningful difference.  A difference  of 2-3 points in the total score of the Quality of Life Index has been associated with significant improvement in overall quality of life, self-image, physical symptoms, and general health in studies assessing change in quality of life.  PHQ-9: Review Flowsheet       11/09/2023  Depression screen PHQ 2/9  Decreased Interest 0  Down, Depressed, Hopeless 1  PHQ - 2 Score 1  Altered sleeping 3  Tired, decreased energy 3  Change in appetite 2  Feeling bad or failure about yourself  3  Trouble concentrating 3  Moving slowly or fidgety/restless 3  Suicidal thoughts 2  PHQ-9 Score 20  Difficult doing work/chores Extremely dIfficult   Interpretation of Total Score  Total Score Depression Severity:  1-4 = Minimal depression, 5-9 = Mild depression, 10-14 = Moderate depression, 15-19 = Moderately severe depression, 20-27 = Severe depression   Psychosocial Evaluation and Intervention:  Psychosocial Evaluation - 11/08/23 1433       Psychosocial Evaluation & Interventions   Interventions Encouraged to exercise with the program and follow exercise prescription;Relaxation education;Stress management education    Comments Shawnique sometimes gets depressed because she cannot breatj at times. She is unable to things that she wants to due to her COPD and she takes Zoloft  to help. She can look to her husband and grandchildren for support.    Expected Outcomes  Short: Start [facility] to help with mood. Long: Maintain a healthy mental state    Continue Psychosocial Services  Follow up required by staff          Psychosocial Re-Evaluation:   Psychosocial Discharge (Final Psychosocial Re-Evaluation):   Education: Education Goals: Education classes will be provided on a weekly basis, covering required topics. Participant will state understanding/return demonstration of topics presented.  Learning Barriers/Preferences:  Learning Barriers/Preferences - 11/08/23 1430       Learning Barriers/Preferences   Learning Barriers None    Learning Preferences None          General Pulmonary Education Topics:  Infection Prevention: - Provides verbal and written material to individual with discussion of infection control including proper hand washing and proper equipment cleaning during exercise session. Flowsheet Row Pulmonary Rehab from 11/08/2023 in [facility] Cardiac and Pulmonary Rehab  Date 11/08/23  Educator [facility]  Instruction Review Code 1- Verbalizes Understanding    Falls Prevention: - Provides verbal and written material to individual with discussion of falls prevention and safety. Flowsheet Row Pulmonary Rehab from 11/08/2023 in [facility] Cardiac and Pulmonary Rehab  Date 11/08/23  Educator [facility]  Instruction Review Code 1- Verbalizes Understanding    Chronic Lung Disease Review: - Group verbal instruction with posters, models, PowerPoint presentations and videos,  to review new updates, new respiratory medications, new advancements in procedures and treatments. Providing information on websites and 800 numbers for continued self-education. Includes information about supplement oxygen, available portable oxygen systems, continuous and intermittent flow rates, oxygen safety, concentrators, and Medicare reimbursement for oxygen. Explanation of Pulmonary Drugs, including class, frequency, complications, importance of spacers, rinsing mouth after steroid  MDI's, and proper cleaning methods for nebulizers. Review of basic lung anatomy and physiology related to function, structure, and complications of lung disease. Review of risk factors. Discussion about methods for diagnosing sleep apnea and types of masks and machines for OSA. Includes a review of the use of types of environmental controls: home humidity, furnaces, filters, dust mite/pet prevention, HEPA vacuums. Discussion about weather changes, air quality and the benefits of nasal washing. Instruction on Warning signs,  infection symptoms, calling MD promptly, preventive modes, and value of vaccinations. Review of effective airway clearance, coughing and/or vibration techniques. Emphasizing that all should Create an Action Plan. Written material given at graduation.   AED/CPR: - Group verbal and written instruction with the use of models to demonstrate the basic use of the AED with the basic ABC's of resuscitation.    Anatomy and Cardiac Procedures: - Group verbal and visual presentation and models provide information about basic cardiac anatomy and function. Reviews the testing methods done to diagnose heart disease and the outcomes of the test results. Describes the treatment choices: Medical Management, Angioplasty, or Coronary Bypass Surgery for treating various heart conditions including Myocardial Infarction, Angina, Valve Disease, and Cardiac Arrhythmias.  Written material given at graduation.   Medication Safety: - Group verbal and visual instruction to review commonly prescribed medications for heart and lung disease. Reviews the medication, class of the drug, and side effects. Includes the steps to properly store meds and maintain the prescription regimen.  Written material given at graduation.   Other: -Provides group and verbal instruction on various topics (see comments)   Knowledge Questionnaire Score:  Knowledge Questionnaire Score - 11/16/23 0936       Knowledge  Questionnaire Score   Pre Score 12/18           Core Components/Risk Factors/Patient Goals at Admission:  Personal Goals and Risk Factors at Admission - 11/08/23 1429       Core Components/Risk Factors/Patient Goals on Admission    Weight Management Yes;Weight Gain    Intervention Weight Management: Develop a combined nutrition and exercise program designed to reach desired caloric intake, while maintaining appropriate intake of nutrient and fiber, sodium and fats, and appropriate energy expenditure required for the weight goal.;Weight Management: Provide education and appropriate resources to help participant work on and attain dietary goals.;Obesity: Provide education and appropriate resources to help participant work on and attain dietary goals.    Expected Outcomes Short Term: Continue to assess and modify interventions until short term weight is achieved;Weight Loss: Understanding of general recommendations for a balanced deficit meal plan, which promotes 1-2 lb weight loss per week and includes a negative energy balance of [phone] kcal/d;Understanding recommendations for meals to include 15-35% energy as protein, 25-35% energy from fat, 35-60% energy from carbohydrates, less than 200mg  of dietary cholesterol, 20-35 gm of total fiber daily;Understanding of distribution of calorie intake throughout the day with the consumption of 4-5 meals/snacks;Weight Gain: Understanding of general recommendations for a high calorie, high protein meal plan that promotes weight gain by distributing calorie intake throughout the day with the consumption for 4-5 meals, snacks, and/or supplements    Improve shortness of breath with ADL's Yes    Intervention Provide education, individualized exercise plan and daily activity instruction to help decrease symptoms of SOB with activities of daily living.    Expected Outcomes Short Term: Improve cardiorespiratory fitness to achieve a reduction of symptoms when  performing ADLs;Long Term: Be able to perform more ADLs without symptoms or delay the onset of symptoms    Hypertension Yes    Intervention Provide education on lifestyle modifcations including regular physical activity/exercise, weight management, moderate sodium restriction and increased consumption of fresh fruit, vegetables, and low fat dairy, alcohol moderation, and smoking cessation.;Monitor prescription use compliance.    Expected Outcomes Short Term: Continued assessment and intervention until BP is < 140/71mm HG in hypertensive participants. < 130/37mm HG in hypertensive participants with diabetes, heart failure or chronic kidney  disease.;Long Term: Maintenance of blood pressure at goal levels.    Lipids Yes    Intervention Provide education and support for participant on nutrition & aerobic/resistive exercise along with prescribed medications to achieve LDL 70mg , HDL >40mg .    Expected Outcomes Short Term: Participant states understanding of desired cholesterol values and is compliant with medications prescribed. Participant is following exercise prescription and nutrition guidelines.;Long Term: Cholesterol controlled with medications as prescribed, with individualized exercise RX and with personalized nutrition plan. Value goals: LDL < 70mg , HDL > 40 mg.          Education:Diabetes - Individual verbal and written instruction to review signs/symptoms of diabetes, desired ranges of glucose level fasting, after meals and with exercise. Acknowledge that pre and post exercise glucose checks will be done for 3 sessions at entry of program.   Know Your Numbers and Heart Failure: - Group verbal and visual instruction to discuss disease risk factors for cardiac and pulmonary disease and treatment options.  Reviews associated critical values for Overweight/Obesity, Hypertension, Cholesterol, and Diabetes.  Discusses basics of heart failure: signs/symptoms and treatments.  Introduces Heart Failure  Zone chart for action plan for heart failure.  Written material given at graduation.   Core Components/Risk Factors/Patient Goals Review:    Core Components/Risk Factors/Patient Goals at Discharge (Final Review):    ITP Comments:  ITP Comments     Row Name 11/08/23 1433 11/09/23 1110 11/16/23 0944       ITP Comments Virtual Visit completed. Patient informed on EP and RD appointment and 6 Minute walk test. Patient also informed of patient health questionnaires on My Chart. Patient Verbalizes understanding. Visit diagnosis can be found in [facility] 10/04/2023. Completed and gym orientation for respiratory care services. Initial ITP created and sent for review to Dr. Faud Aleskerov, Medical Director. First full day of exercise!  Patient was oriented to gym and equipment including functions, settings, policies, and procedures.  Patient's individual exercise prescription and treatment plan were reviewed.  All starting workloads were established based on the results of the 6 minute walk test done at initial orientation visit.  The plan for exercise progression was also introduced and progression will be customized based on patient's performance and goals.        Comments: 30 day review

## 2023-12-07 ENCOUNTER — Encounter: Attending: Pulmonary Disease | Admitting: *Deleted

## 2023-12-07 DIAGNOSIS — J449 Chronic obstructive pulmonary disease, unspecified: Secondary | ICD-10-CM | POA: Insufficient documentation

## 2023-12-07 NOTE — Progress Notes (Signed)
 Daily Session Note  Patient Details  Name: Taylor Chambers MRN: [id number] Date of Birth: Mar 16, 1946 Referring Provider:   Conrad Ports Pulmonary Rehab from 11/09/2023 in [facility] Cardiac and Pulmonary Rehab  Referring Provider Dr. Halina Picking, MD    Encounter Date: 12/07/2023  Check In:  Session Check In - 12/07/23 0945       Check-In   Supervising physician immediately available to respond to emergencies See telemetry face sheet for immediately available ER MD    Location [facility]-Cardiac & Pulmonary Rehab    Staff Present Othel Durand, RN, BSN, CCRP;Maxon Conetta BS, Exercise Physiologist;Jason Elnor RDN,LDN;[employer]    Virtual Visit No    Medication changes reported     No    Fall or balance concerns reported    No    Warm-up and Cool-down Performed on first and last piece of equipment    Resistance Training Performed Yes    VAD Patient? No    PAD/SET Patient? No      Pain Assessment   Currently in Pain? No/denies             Social History   Tobacco Use  Smoking Status Former   Current packs/day: 0.00   Average packs/day: 0.3 packs/day for 50.0 years (12.5 ttl pk-yrs)   Types: Cigarettes   Start date: 01/10/1972   Quit date: 01/09/2022   Years since quitting: 1.9  Smokeless Tobacco Never    Goals Met:  Proper associated with RPD/PD & O2 Sat Independence with exercise equipment Exercise tolerated well No report of concerns or symptoms today  Goals Unmet:  Not Applicable  Comments: Pt able to follow exercise prescription today without complaint.  Will continue to monitor for progression.    Dr. Oneil Pinal is Medical Director for [facility] Cardiac Rehabilitation.  Dr. Fuad Aleskerov is Medical Director for [facility] Pulmonary Rehabilitation.

## 2023-12-12 ENCOUNTER — Encounter

## 2023-12-12 DIAGNOSIS — J449 Chronic obstructive pulmonary disease, unspecified: Secondary | ICD-10-CM

## 2023-12-12 NOTE — Progress Notes (Signed)
 Daily Session Note  Patient Details  Name: Niobe Dick MRN: [id number] Date of Birth: 09-13-1945 Referring Provider:   Conrad Ports Pulmonary Rehab from 11/09/2023 in [facility] Cardiac and Pulmonary Rehab  Referring Provider Dr. Halina Picking, MD    Encounter Date: 12/12/2023  Check In:  Session Check In - 12/12/23 0915       Check-In   Supervising physician immediately available to respond to emergencies See telemetry face sheet for immediately available ER MD    Location [facility]-Cardiac & Pulmonary Rehab    Staff Present Burnard Davenport [facility] Dyane BS, ACSM CEP, Exercise Physiologist;Margaret Best, MS, Exercise Physiologist;Jason Elnor RDN,LDN    Virtual Visit No    Medication changes reported     No    Fall or balance concerns reported    No    Warm-up and Cool-down Performed on first and last piece of equipment    Resistance Training Performed Yes    VAD Patient? No    PAD/SET Patient? No      Pain Assessment   Currently in Pain? No/denies             Social History   Tobacco Use  Smoking Status Former   Current packs/day: 0.00   Average packs/day: 0.3 packs/day for 50.0 years (12.5 ttl pk-yrs)   Types: Cigarettes   Start date: 01/10/1972   Quit date: 01/09/2022   Years since quitting: 1.9  Smokeless Tobacco Never    Goals Met:  Independence with exercise equipment Exercise tolerated well No report of concerns or symptoms today Strength training completed today  Goals Unmet:  Not Applicable  Comments: Pt able to follow exercise prescription today without complaint.  Will continue to monitor for progression.    Dr. Oneil Pinal is Medical Director for [facility] Cardiac Rehabilitation.  Dr. Fuad Aleskerov is Medical Director for [facility] Pulmonary Rehabilitation.

## 2023-12-14 ENCOUNTER — Encounter: Admitting: *Deleted

## 2023-12-14 DIAGNOSIS — J449 Chronic obstructive pulmonary disease, unspecified: Secondary | ICD-10-CM

## 2023-12-14 NOTE — Progress Notes (Signed)
 Daily Session Note  Patient Details  Name: Taylor Chambers MRN: [id number] Date of Birth: 01-09-1946 Referring Provider:   Conrad Ports Pulmonary Rehab from 11/09/2023 in [facility] Cardiac and Pulmonary Rehab  Referring Provider Dr. Halina Picking, MD    Encounter Date: 12/14/2023  Check In:  Session Check In - 12/14/23 0929       Check-In   Supervising physician immediately available to respond to emergencies See telemetry face sheet for immediately available ER MD    Location [facility]-Cardiac & Pulmonary Rehab    Staff Present Othel Durand, RN, BSN, CCRP;Laureen Delores, BS, RRT, CPFT;Jason Gray RDN,LDN;Joseph Hood RCP,RRT,BSRT;Meredith Craven RN,BSN    Virtual Visit No    Medication changes reported     No    Fall or balance concerns reported    No    Warm-up and Cool-down Performed on first and last piece of equipment    Resistance Training Performed Yes    VAD Patient? No    PAD/SET Patient? No      Pain Assessment   Currently in Pain? No/denies             Social History   Tobacco Use  Smoking Status Former   Current packs/day: 0.00   Average packs/day: 0.3 packs/day for 50.0 years (12.5 ttl pk-yrs)   Types: Cigarettes   Start date: 01/10/1972   Quit date: 01/09/2022   Years since quitting: 1.9  Smokeless Tobacco Never    Goals Met:  Proper associated with RPD/PD & O2 Sat Independence with exercise equipment Exercise tolerated well No report of concerns or symptoms today  Goals Unmet:  Not Applicable  Comments: Pt able to follow exercise prescription today without complaint.  Will continue to monitor for progression.    Dr. Oneil Pinal is Medical Director for [facility] Cardiac Rehabilitation.  Dr. Fuad Aleskerov is Medical Director for [facility] Pulmonary Rehabilitation.

## 2023-12-18 NOTE — Progress Notes (Unsigned)
 Cardiology Office Note    Date:  12/19/2023   ID:  Taylor Chambers, Taylor Chambers Mar 10, 1946, MRN [id number]  PCP:  Epifanio Alm SQUIBB, MD  Cardiologist:  Deatrice Cage, MD  Electrophysiologist:  None   Chief Complaint: Follow-up  History of Present Illness:   Taylor Chambers is a 78 y.o. female with history of nonobstructive CAD by coronary CTA in 04/2022, HFimpEF, PAF diagnosed in 09/2022, moderate to severe mitral regurgitation, HTN, HLD, bipolar disorder, chronic hypoxic respiratory failure on nocturnal oxygen and BiPAP, and COPD with prior tobacco use quitting in 2023 who presents for follow-up of CAD, cardiomyopathy, and A-fib.   She was previously followed by Dr. Florencio, establishing care with Dr. Cage in 12/2022.  Echo in 02/2020 showed an EF greater than 55%, normal wall motion, grade 1 diastolic dysfunction, normal RV systolic function, moderate mitral regurgitation, and mild to moderate tricuspid regurgitation.  Lexiscan MPI in 02/2020 showed no evidence of ischemia with an EF of 64%.  Echo in 01/2022 showed an EF of 45% with moderate mitral regurgitation.  Coronary CTA in 04/2022 showed a calcium score of 95, which was the 59th percentile.  There was 25% proximal LCx stenosis with less than 25% proximal RCA stenosis.  Noncardiac overread showed no acute extracardiac findings with resolution of tree-in-bud nodules when compared to prior study as well as a solid pulmonary nodules in the right lower lobe that were unchanged when compared to study from 07/2021 with recommendation for annual follow-up (patient already undergoing lung cancer screening CTs).  She was admitted to the hospital in 09/2022 with COPD exacerbation and was noted to be in A-fib with RVR on presentation.  She converted to sinus rhythm with diltiazem .  She was not discharged on anticoagulation.  Upon establishing care with our office in 12/2022, she did note occasional exertional chest pain and shortness of breath as well as  frequent palpitations, particularly with exertion.  She was in sinus rhythm with PACs.  To further evaluate palpitation burden, she underwent a Zio patch that showed a predominant rhythm of sinus with an average rate of 84 bpm (range 56 to 214 bpm), 106 episodes of SVT lasting up to 2 minutes and 10 seconds, 1% A-fib/flutter burden with ventricular rates ranging from 119 to 213 bpm with an average ventricular rate of 153 bpm and the longest episode lasting 2 hours and 4 minutes.  Echo in 01/2023 showed an EF of 55 to 60%, no regional wall motion abnormalities, normal RV systolic function, ventricular cavity size, and RVSP, moderate to severe mitral valve regurgitation with mild to late systolic prolapse of multiple segments of the anterior leaflet of the mitral valve, mild to moderate tricuspid regurgitation, and an estimated right atrial pressure of 3 mmHg.  To further evaluate her mitral regurgitation, she underwent TEE on 04/20/2023 that showed an EF of 60 to 65%, no regional wall motion abnormalities, normal RV systolic function and ventricular cavity size, no left atrial or left atrial appendage thrombus, moderate mitral regurgitation, aortic atherosclerosis involving the aortic arch and descending aorta, estimated right atrial pressure of 3 mmHg, and no evidence of interatrial shunt.    She was admitted to the hospital in 06/2023 with sepsis with acute hypoxic and hypercapnic respiratory failure secondary to pneumonia, COPD exacerbation, HFpEF.  Echo showed an EF of 55 to 60%, no regional wall motion abnormalities, grade 2 diastolic dysfunction, normal RV systolic function and ventricular cavity size, moderately elevated RVSP estimated at 53.2 mmHg, degenerative mitral  valve with moderate to severe regurgitation, mild to moderate tricuspid regurgitation, trivial aortic insufficiency with aortic valve sclerosis without evidence of stenosis, and an estimated right atrial pressure of 8 mmHg.  Due to persisting  dyspnea she was readmitted in 06/2023 and underwent right-sided thoracentesis yielding 300 mL of pleural fluid.     She was admitted to the hospital in 08/2023 with sepsis with acute on chronic hypoxic and hypercapnic respiratory failure requiring BiPAP secondary to COPD exacerbation, pneumonia, and HFpEF.  Admission was notable for A-fib with RVR converting to sinus rhythm with IV amiodarone .  Minimal troponin elevation felt to represent supply/demand ischemia in the setting of the above.   She was admitted to the hospital from 09/03/23 through 09/06/2023 with hallucinations.  Chest x-ray obtained at that time showed underlying COPD with improving left upper lobe nodular pulmonary infiltrate.  There was concern for prolonged QTc with significant underlying artifact and wandering possibly contributing.   CT of the chest on 09/14/2023 with features compatible with infectious/inflammatory changes within the right lung base as well as subpleural reticulation raising concern for chronic interstitial lung disease and emphysema.  She was last seen in the office on 09/19/2023 noting improvement in underlying dyspnea and was without symptoms of angina or cardiac decompensation.  She reported adherence to BiPAP with supplemental oxygen at nighttime with intermittent adherence to oxygen during the day.  With underlying pulmonary disease and prior history of QT prolongation, amiodarone  was discontinued.  She comes in today and is doing well from a cardiac perspective, without symptoms of angina or cardiac decompensation.  Dyspnea continues to improve.  No lower extremity swelling or orthopnea.  No falls, hematochezia, or melena.  Weight is stable.  Continues to wear BiPAP with supplemental oxygen at nighttime and intermittently wears oxygen during the day if she is exerting herself.  Not currently taking furosemide  or metoprolol .  Has follow-up with pulmonology later this month.   Labs independently reviewed: 09/2023 -  potassium 3.9, BUN 16, serum creatinine 0.8, albumin 3.8, AST/ALT normal 08/2023 - magnesium  2.4, Hgb 11.9, PLT 236 07/2023 - TSH 5.88, free T4 elevated 1.27 06/2023 - A1c 5.6 05/2023 - TC 159, TG 121, HDL 64, LDL 74  Past Medical History:  Diagnosis Date   Allergies    Anxiety    Aortic atherosclerosis (HCC)    Bipolar affective disorder (HCC)    COPD (chronic obstructive pulmonary disease) (HCC)    Coronary artery calcification seen on CT scan    Depression    Diastolic dysfunction 02/24/2020   a.) TTE 02/24/2020: EF >55%; triv PR, mild TR, mod MR; G1DD.   DOE (dyspnea on exertion)    Emphysema lung (HCC)    History of cataract    HLD (hyperlipidemia)    Hypertension    Macular degeneration    Migraines    Osteoporosis    Pneumonia    Tobacco use     Past Surgical History:  Procedure Laterality Date   CATARACT EXTRACTION     COLONOSCOPY N/A 08/30/2021   Procedure: COLONOSCOPY;  Surgeon: Onita Elspeth Sharper, DO;  Location: [facility] ENDOSCOPY;  Service: Gastroenterology;  Laterality: N/A;   DILATION AND CURETTAGE OF UTERUS     TEE WITHOUT CARDIOVERSION N/A 04/20/2023   Procedure: TRANSESOPHAGEAL ECHOCARDIOGRAM;  Surgeon: Perla Evalene PARAS, MD;  Location: [facility] ORS;  Service: Cardiovascular;  Laterality: N/A;   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   Procedure: TOTAL HIP ARTHROPLASTY;  Surgeon: Mardee Lynwood SQUIBB, MD;  Location: [facility] ORS;  Service: Orthopedics;  Laterality: Right;   WISDOM TOOTH EXTRACTION     WRIST GANGLION EXCISION      Current Medications: Current Meds  Medication Sig   albuterol  (VENTOLIN  HFA) 108 (90 Base) MCG/ACT inhaler Inhale 2 puffs into the lungs every 6 (six) hours as needed for wheezing or shortness of breath.   apixaban  (ELIQUIS ) 5 MG TABS tablet TAKE 1 TABLET BY MOUTH TWICE A DAY   Cholecalciferol  (VITAMIN D3) 1.25 MG (50000 UT) CAPS Take 5,000 Units by mouth daily in the afternoon.   divalproex  (DEPAKOTE ) 250 MG DR tablet Take 250 mg by mouth.    DUPIXENT  300 MG/2ML prefilled syringe Inject 300 mg into the skin.   EPINEPHrine 0.3 mg/0.3 mL IJ SOAJ injection Inject 0.3 mg into the muscle as needed for anaphylaxis.   ipratropium (ATROVENT ) 0.06 % nasal spray Place 1 spray into both nostrils daily.   losartan  (COZAAR ) 100 MG tablet Take 1 tablet (100 mg total) by mouth daily.   Multiple Vitamin (MULTI-VITAMIN) tablet Take 1 tablet by mouth daily.   pravastatin  (PRAVACHOL ) 10 MG tablet Take 10 mg by mouth daily.   sulfamethoxazole -trimethoprim  (BACTRIM ) 400-80 MG tablet Take 1 tablet by mouth 3 (three) times a week.    Allergies:   Ativan  [lorazepam ] and Bupropion   Social History   Socioeconomic History   Marital status: Married    Spouse name: Christopher   Number of children: Not on file   Years of education: Not on file   Highest education level: Not on file  Occupational History   Not on file  Tobacco Use   Smoking status: Former    Current packs/day: 0.00    Average packs/day: 0.3 packs/day for 50.0 years (12.5 ttl pk-yrs)    Types: Cigarettes    Start date: 01/10/1972    Quit date: 01/09/2022    Years since quitting: 1.9   Smokeless tobacco: Never  Vaping Use   Vaping status: Never Used  Substance and Sexual Activity   Alcohol use: Never   Drug use: Never   Sexual activity: Not Currently  Other Topics Concern   Not on file  Social History Narrative   Not on file   Social Drivers of Health   Financial Resource Strain: Low Risk  (09/11/2023)   Received from [facility]   Overall Financial Resource Strain (CARDIA)    Difficulty of Paying Living Expenses: Not hard at all  Food Insecurity: No Food Insecurity (09/11/2023)   Received from [facility]   Hunger Vital Sign    Within the past 12 months, you worried that your food would run out before you got the money to buy more.: Never true    Within the past 12 months, the food you bought just didn't last and you didn't have money to get  more.: Never true  Transportation Needs: No Transportation Needs (09/11/2023)   Received from [facility] - Transportation    In the past 12 months, has lack of transportation kept you from medical appointments or from getting medications?: No    Lack of Transportation (Non-Medical): No  Physical Activity: Not on file  Stress: Not on file  Social Connections: Moderately Isolated (08/23/2023)   Social Connection and Isolation Panel    Frequency of Communication with Friends and Family: Three times a week    Frequency of Social Gatherings with Friends and Family: More than three times a week    Attends Religious  Services: Never    Database administrator or Organizations: No    Attends Engineer, structural: Never    Marital Status: Married     Family History:  The patient's family history includes Breast cancer (age of onset: 24) in her mother; Heart Problems in her maternal uncle, paternal aunt, and paternal uncle.  ROS:   12-point review of systems is negative unless otherwise noted in the HPI.   EKGs/Labs/Other Studies Reviewed:    Studies reviewed were summarized above. The additional studies were reviewed today:  2D echo 06/26/2023: 1. Left ventricular ejection fraction, by estimation, is 55 to 60%. The  left ventricle has normal function. The left ventricle has no regional  wall motion abnormalities. Left ventricular diastolic parameters are  consistent with Grade II diastolic  dysfunction (pseudonormalization).   2. Right ventricular systolic function is normal. The right ventricular  size is normal. There is moderately elevated pulmonary artery systolic  pressure.   3. The mitral valve is degenerative. Moderate to severe mitral valve  regurgitation. No evidence of mitral stenosis.   4. Tricuspid valve regurgitation is mild to moderate.   5. The aortic valve is tricuspid. There is mild thickening of the aortic  valve. Aortic valve  regurgitation is trivial. Aortic valve  sclerosis/calcification is present, without any evidence of aortic  stenosis.   6. The inferior vena cava is normal in size with <50% respiratory  variability, suggesting right atrial pressure of 8 mmHg.  __________   TEE 04/20/2023: 1. Left ventricular ejection fraction, by estimation, is 60 to 65%. The  left ventricle has normal function. The left ventricle has no regional  wall motion abnormalities.   2. Right ventricular systolic function is normal. The right ventricular  size is normal.   3. No left atrial/left atrial appendage thrombus was detected.   4. The mitral valve is normal in structure. Moderate mitral valve  regurgitation. No evidence of mitral stenosis. No significant valve  prolapse noted.   5. The aortic valve is normal in structure. Aortic valve regurgitation is  not visualized. No aortic stenosis is present.   6. There is mild (Grade II) atheroma plaque involving the aortic arch and  descending aorta.   7. The inferior vena cava is normal in size with greater than 50%  respiratory variability, suggesting right atrial pressure of 3 mmHg.   8. Agitated saline contrast bubble study was negative, with no evidence  of any interatrial shunt.   Conclusion(s)/Recommendation(s): Normal biventricular function without  evidence of hemodynamically significant valvular heart disease.   FINDINGS   Left Ventricle: Left ventricular ejection fraction, by estimation, is 60  to 65%. The left ventricle has normal function. The left ventricle has no  regional wall motion abnormalities. The left ventricular internal cavity  size was normal in size. There is   no left ventricular hypertrophy.  __________   2D echo 01/26/2023: 1. Left ventricular ejection fraction, by estimation, is 55 to 60%. The  left ventricle has normal function. The left ventricle has no regional  wall motion abnormalities. Left ventricular diastolic parameters are   indeterminate. The average left  ventricular global longitudinal strain is -19.0 %.   2. Right ventricular systolic function is normal. The right ventricular  size is normal. There is normal pulmonary artery systolic pressure. The  estimated right ventricular systolic pressure is 28.8 mmHg.   3. The mitral valve is normal in structure. Moderate to severe mitral  valve regurgitation. No evidence of mitral  stenosis. There is mild late  systolic prolapse of multiple segments of the anterior leaflet of the  mitral valve.   4. Tricuspid valve regurgitation is mild to moderate.   5. The aortic valve is normal in structure. Aortic valve regurgitation is  not visualized. No aortic stenosis is present.   6. The inferior vena cava is normal in size with greater than 50%  respiratory variability, suggesting right atrial pressure of 3 mmHg.  __________   Zio patch 12/2022: Patient had a min HR of 56 bpm, max HR of 214 bpm, and avg HR of 84 bpm. Predominant underlying rhythm was Sinus Rhythm. 106 Supraventricular Tachycardia runs occurred, the run with the fastest interval lasting 6 beats with a max rate of 214 bpm, the longest lasting 2 mins 10 secs with an avg rate of 146 bpm. Atrial Fibrillation/Flutter occurred (1% burden), ranging from 119-213 bpm (avg of 153 bpm), the longest lasting 2 hours 4 mins with an avg rate of 152 bpm.  Occasional PACs and rare PVCs. __________   Coronary CTA 04/07/2022: FINDINGS: Aorta: Normal size. Mild aortic root and descending aorta calcifications. No dissection.   Aortic Valve:  Trileaflet.  No calcifications.   Coronary Arteries:  Normal coronary origin.  Right dominance.   RCA is a dominant artery that gives rise to PDA and PLA. There is calcified plaque proximally causing minimal stenosis (<25%).   Left main gives rise to LAD and LCX arteries.  LM has no stenosis.   LAD has no plaque.   LCX is a non-dominant artery that gives rise to two obtuse  marginal branches. There is calcified plaque proximally causing mild stenosis (25-49%).   Other findings:   Normal pulmonary vein drainage into the left atrium.   Normal left atrial appendage without a thrombus.   Normal size of the pulmonary artery.   IMPRESSION: 1. Coronary calcium score of 95.3. This was 59th percentile for age and sex matched control. 2. Normal coronary origin with right dominance. 3. Mild proximal LCx stenosis (25%). 4. Minimal proximal RCA stenosis (<25%). 5. CAD-RADS 2. Mild non-obstructive CAD (25-49%). Consider non-atherosclerotic causes of chest pain. Consider preventive therapy and risk factor modification. 6. Image quality degraded by motion artifacts.   Noncardiac overread: IMPRESSION: 1. No acute extracardiac findings. 2. Tree-in-bud nodules which were present on most recent prior chest CT have resolved, likely sequela of infection or aspiration. 3. Solid pulmonary nodules of the right lower lobe are unchanged in size when compared with prior lung cancer screening CT chest CT dated July 16, 2021. Recommend attention on annual lung cancer screening CT. 4. Aortic Atherosclerosis and Emphysema. __________   2D echo 01/18/2022 [facility]): MILD LV SYSTOLIC DYSFUNCTION (See above)  NORMAL RIGHT VENTRICULAR SYSTOLIC FUNCTION  NO VALVULAR STENOSIS  MODERATE MR  MILD TR, PR  EF 45%  __________   Morris MPI 02/24/2020 [facility]): Normal myocardial perfusion scan no evidence of stress-induced  medical ischemia ejection fraction of 64% conclusion negative scan  __________   2D echo 02/24/2020 [facility]): NORMAL LEFT VENTRICULAR SYSTOLIC FUNCTION WITH AN ESTIMATED EF = >55 %  NORMAL RIGHT VENTRICULAR SYSTOLIC FUNCTION  MODERATE MITRAL VALVE INSUFFICIENCY  MILD-TO-MODERATE TRICUSPID VALVE INSUFFICIENCY  NO VALVULAR STENOSIS    EKG:  EKG is ordered today.  The EKG ordered today demonstrates NSR, 79 bpm, nonspecific ST-T changes, consistent  with prior tracing  Recent Labs: 06/24/2023: TSH 7.817 08/21/2023: ALT 49 08/23/2023: B Natriuretic Peptide 1,377.0 09/03/2023: BUN 28; Creatinine, Ser 0.94; Hemoglobin 11.9; Magnesium   2.4; Platelets 236; Potassium 2.9; Sodium 140  Recent Lipid Panel    Component Value Date/Time   CHOL 159 05/16/2023 1045   TRIG 121 05/16/2023 1045   HDL 64 05/16/2023 1045   CHOLHDL 2.5 05/16/2023 1045   LDLCALC 74 05/16/2023 1045   LDLDIRECT 71 05/16/2023 1045    PHYSICAL EXAM:    VS:  BP (!) 120/30 (BP Location: Right Arm, Patient Position: Sitting, Cuff Size: Normal)   Pulse 79   Ht 5' 2 (1.575 m)   Wt 105 lb 6.4 oz (47.8 kg)   SpO2 90%   BMI 19.28 kg/m   BMI: Body mass index is 19.28 kg/m.  Physical Exam Vitals reviewed.  Constitutional:      Appearance: She is well-developed.  HENT:     Head: Normocephalic and atraumatic.  Eyes:     General:        Right eye: No discharge.        Left eye: No discharge.  Neck:     Vascular: No JVD.  Cardiovascular:     Rate and Rhythm: Normal rate and regular rhythm.     Heart sounds: S1 normal and S2 normal. Heart sounds not distant. No midsystolic click and no opening snap. Murmur heard.     Systolic murmur is present with a grade of 1/6 at the lower left sternal border.     No friction rub.  Pulmonary:     Effort: Pulmonary effort is normal. No respiratory distress.     Breath sounds: Normal breath sounds. No decreased breath sounds, wheezing, rhonchi or rales.  Chest:     Chest wall: No tenderness.  Musculoskeletal:     Cervical back: Normal range of motion.     Right lower leg: No edema.     Left lower leg: No edema.  Skin:    General: Skin is warm and dry.     Nails: There is no clubbing.  Neurological:     Mental Status: She is alert and oriented to person, place, and time.  Psychiatric:        Speech: Speech normal.        Behavior: Behavior normal.        Thought Content: Thought content normal.        Judgment: Judgment  normal.     Wt Readings from Last 3 Encounters:  12/19/23 105 lb 6.4 oz (47.8 kg)  11/09/23 100 lb 11.2 oz (45.7 kg)  09/19/23 102 lb 9.6 oz (46.5 kg)     ASSESSMENT & PLAN:   PAF: Maintaining sinus rhythm, no longer on amiodarone  with underlying pulmonary disease and questionable history of QT prolongation.  Not currently taking metoprolol .  CHA2DS2-VASc at least 6 (CHF, HTN, age x 2, vascular disease, sex category).  She remains on apixaban  5 mg twice daily and does not currently meet reduced dosing criteria.  No falls or symptoms concerning for bleeding.  Check BMP and CBC.  HFimpEF: Euvolemic and well compensated.  NYHA class is difficult to assess secondary to underlying pulmonary disease.  Not requiring standing loop diuretic.  Defer escalation of GDMT given preserved LV systolic function and no heart failure symptoms.  Moderate mitral regurgitation: Moderate by TTE in 04/2023.  Monitor with periodic echo.  Nonobstructive CAD: She is doing well and without symptoms concerning for angina or cardiac decompensation.  On apixaban  in place of aspirin  given underlying A-fib to minimize bleeding risk.  Continue aggressive risk factor modification and primary prevention.  HTN:  Blood pressure is well-controlled in the office today.  HLD: LDL 74 in 05/2023.  Remains on pravastatin .  COPD: Stable, without acute exacerbation.  Followed by pulmonology and currently enrolled in pulmonary rehab.     Disposition: F/u with Dr. Darron or an APP in 6 months.   Medication Adjustments/Labs and Tests Ordered: Current medicines are reviewed at length with the patient today.  Concerns regarding medicines are outlined above. Medication changes, Labs and Tests ordered today are summarized above and listed in the Patient Instructions accessible in Encounters.   Signed, Bernardino Bring, PA-C 12/19/2023 10:00 AM     [facility] [place] [address] [place], [place]  [address] [phone]

## 2023-12-19 ENCOUNTER — Ambulatory Visit: Attending: Physician Assistant | Admitting: Physician Assistant

## 2023-12-19 ENCOUNTER — Encounter

## 2023-12-19 ENCOUNTER — Encounter: Payer: Self-pay | Admitting: Physician Assistant

## 2023-12-19 VITALS — BP 120/30 | HR 79 | Ht 62.0 in | Wt 105.4 lb

## 2023-12-19 DIAGNOSIS — I340 Nonrheumatic mitral (valve) insufficiency: Secondary | ICD-10-CM | POA: Diagnosis not present

## 2023-12-19 DIAGNOSIS — I480 Paroxysmal atrial fibrillation: Secondary | ICD-10-CM

## 2023-12-19 DIAGNOSIS — I2510 Atherosclerotic heart disease of native coronary artery without angina pectoris: Secondary | ICD-10-CM

## 2023-12-19 DIAGNOSIS — I5032 Chronic diastolic (congestive) heart failure: Secondary | ICD-10-CM

## 2023-12-19 DIAGNOSIS — J449 Chronic obstructive pulmonary disease, unspecified: Secondary | ICD-10-CM

## 2023-12-19 DIAGNOSIS — E785 Hyperlipidemia, unspecified: Secondary | ICD-10-CM

## 2023-12-19 DIAGNOSIS — I10 Essential (primary) hypertension: Secondary | ICD-10-CM

## 2023-12-19 NOTE — Patient Instructions (Signed)
 Medication Instructions:  Your physician recommends that you continue on your current medications as directed. Please refer to the Current Medication list given to you today.   *If you need a refill on your cardiac medications before your next appointment, please call your pharmacy*  Lab Work: Your provider would like for you to have following labs drawn today BMeT and CBC.   If you have labs (blood work) drawn today and your tests are completely normal, you will receive your results only by: MyChart Message (if you have MyChart) OR A paper copy in the mail If you have any lab test that is abnormal or we need to change your treatment, we will call you to review the results.  Testing/Procedures: None ordered at this time   Follow-Up: At [facility], you and your health needs are our priority.  As part of our continuing mission to provide you with exceptional heart care, our providers are all part of one team.  This team includes your primary Cardiologist (physician) and Advanced Practice Providers or APPs (Physician Assistants and Nurse Practitioners) who all work together to provide you with the care you need, when you need it.  Your next appointment:   6 month(s)  Provider:   You may see Deatrice Cage, MD or Bernardino Bring, PA-C  We recommend signing up for the patient portal called MyChart.  Sign up information is provided on this After Visit Summary.  MyChart is used to connect with patients for Virtual Visits (Telemedicine).  Patients are able to view lab/test results, encounter notes, upcoming appointments, etc.  Non-urgent messages can be sent to your provider as well.   To learn more about what you can do with MyChart, go to [email or web address].

## 2023-12-20 LAB — CBC
Hematocrit: 42.5 % (ref 34.0–46.6)
Hemoglobin: 13.9 g/dL (ref 11.1–15.9)
MCH: 33.7 pg — ABNORMAL HIGH (ref 26.6–33.0)
MCHC: 32.7 g/dL (ref 31.5–35.7)
MCV: 103 fL — ABNORMAL HIGH (ref 79–97)
Platelets: 234 x10E3/uL (ref 150–450)
RBC: 4.12 x10E6/uL (ref 3.77–5.28)
RDW: 12.1 % (ref 11.7–15.4)
WBC: 9.6 x10E3/uL (ref 3.4–10.8)

## 2023-12-20 LAB — BASIC METABOLIC PANEL WITH GFR
BUN/Creatinine Ratio: 18 (ref 12–28)
BUN: 14 mg/dL (ref 8–27)
CO2: 22 mmol/L (ref 20–29)
Calcium: 9.1 mg/dL (ref 8.7–10.3)
Chloride: 102 mmol/L (ref 96–106)
Creatinine, Ser: 0.78 mg/dL (ref 0.57–1.00)
Glucose: 74 mg/dL (ref 70–99)
Potassium: 4.1 mmol/L (ref 3.5–5.2)
Sodium: 141 mmol/L (ref 134–144)
eGFR: 79 mL/min/1.73 (ref >59)

## 2023-12-21 ENCOUNTER — Encounter: Admitting: *Deleted

## 2023-12-21 ENCOUNTER — Ambulatory Visit: Payer: Self-pay | Admitting: Physician Assistant

## 2023-12-21 DIAGNOSIS — J449 Chronic obstructive pulmonary disease, unspecified: Secondary | ICD-10-CM

## 2023-12-21 NOTE — Progress Notes (Signed)
 Daily Session Note  Patient Details  Name: Taylor Chambers MRN: [id number] Date of Birth: [birth year] Referring Provider:   [facility] from 11/09/2023 in [facility] Cardiac and Pulmonary Rehab  Referring Provider Dr. Halina Picking, MD    Encounter Date: 12/21/2023  Check In:  Session Check In - 12/21/23 1010       Check-In   Supervising physician immediately available to respond to emergencies See telemetry face sheet for immediately available ER MD    Location [facility]-Cardiac & Pulmonary Rehab    Staff Present Othel Durand, RN, BSN, CCRP;Maxon Conetta BS, Exercise Physiologist;Jason Elnor RDN,LDN;[employer]    Virtual Visit No    Medication changes reported     No    Fall or balance concerns reported    No    Warm-up and Cool-down Performed on first and last piece of equipment    Resistance Training Performed Yes    VAD Patient? No    PAD/SET Patient? No      Pain Assessment   Currently in Pain? No/denies             Social History   Tobacco Use  Smoking Status Former   Current packs/day: 0.00   Average packs/day: 0.3 packs/day for 50.0 years (12.5 ttl pk-yrs)   Types: Cigarettes   Start date: 01/10/1972   Quit date: 01/09/2022   Years since quitting: 1.9  Smokeless Tobacco Never    Goals Met:  Proper associated with RPD/PD & O2 Sat Independence with exercise equipment Exercise tolerated well No report of concerns or symptoms today  Goals Unmet:  Not Applicable  Comments: Pt able to follow exercise prescription today without complaint.  Will continue to monitor for progression.    Dr. Oneil Pinal is Medical Director for [facility] Cardiac Rehabilitation.  Dr. Fuad Aleskerov is Medical Director for [facility] Pulmonary Rehabilitation.

## 2023-12-26 ENCOUNTER — Encounter

## 2023-12-26 DIAGNOSIS — J449 Chronic obstructive pulmonary disease, unspecified: Secondary | ICD-10-CM | POA: Diagnosis not present

## 2023-12-26 NOTE — Progress Notes (Signed)
 Daily Session Note  Patient Details  Name: Taylor Chambers MRN: [id number] Date of Birth: 10-Sep-1945 Referring Provider:   [facility] from 11/09/2023 in [facility] Cardiac and Pulmonary Rehab  Referring Provider Dr. Halina Picking, MD    Encounter Date: 12/26/2023  Check In:  Session Check In - 12/26/23 0907       Check-In   Supervising physician immediately available to respond to emergencies See telemetry face sheet for immediately available ER MD    Location [facility]-Cardiac & Pulmonary Rehab    Staff Present Burnard Davenport RN,BSN,MPA;Margaret Best, MS, Exercise Physiologist;Jason Elnor RDN,LDN;Maxon Conetta BS, Exercise Physiologist    Virtual Visit No    Medication changes reported     No    Fall or balance concerns reported    No    Warm-up and Cool-down Performed on first and last piece of equipment    Resistance Training Performed Yes    VAD Patient? No    PAD/SET Patient? No      Pain Assessment   Currently in Pain? No/denies             Social History   Tobacco Use  Smoking Status Former   Current packs/day: 0.00   Average packs/day: 0.3 packs/day for 50.0 years (12.5 ttl pk-yrs)   Types: Cigarettes   Start date: 01/10/1972   Quit date: 01/09/2022   Years since quitting: 1.9  Smokeless Tobacco Never    Goals Met:  Independence with exercise equipment Exercise tolerated well No report of concerns or symptoms today Strength training completed today  Goals Unmet:  Not Applicable  Comments: Pt able to follow exercise prescription today without complaint.  Will continue to monitor for progression.    Dr. Oneil Pinal is Medical Director for [facility] Cardiac Rehabilitation.  Dr. Fuad Aleskerov is Medical Director for [facility] Pulmonary Rehabilitation.

## 2023-12-28 ENCOUNTER — Encounter

## 2023-12-28 DIAGNOSIS — J449 Chronic obstructive pulmonary disease, unspecified: Secondary | ICD-10-CM

## 2023-12-28 NOTE — Progress Notes (Signed)
 Daily Session Note  Patient Details  Name: Taylor Chambers MRN: [id number] Date of Birth: 1945-09-13 Referring Provider:   [facility] from 11/09/2023 in [facility] Cardiac and Pulmonary Rehab  Referring Provider Dr. Halina Picking, MD    Encounter Date: 12/28/2023  Check In:  Session Check In - 12/28/23 0928       Check-In   Supervising physician immediately available to respond to emergencies See telemetry face sheet for immediately available ER MD    Location [facility]-Cardiac & Pulmonary Rehab    Staff Present Burnard Davenport RN,BSN,MPA;Maxon Conetta BS, Exercise Physiologist;Kristen Coble RN,BC,MSN;Margaret Best, MS, Exercise Physiologist    Virtual Visit No    Medication changes reported     No    Fall or balance concerns reported    No    Warm-up and Cool-down Performed on first and last piece of equipment    Resistance Training Performed Yes    VAD Patient? No    PAD/SET Patient? No      Pain Assessment   Currently in Pain? No/denies             Social History   Tobacco Use  Smoking Status Former   Current packs/day: 0.00   Average packs/day: 0.3 packs/day for 50.0 years (12.5 ttl pk-yrs)   Types: Cigarettes   Start date: 01/10/1972   Quit date: 01/09/2022   Years since quitting: 1.9  Smokeless Tobacco Never    Goals Met:  Independence with exercise equipment Exercise tolerated well No report of concerns or symptoms today Strength training completed today  Goals Unmet:  Not Applicable  Comments: Pt able to follow exercise prescription today without complaint.  Will continue to monitor for progression.    Dr. Oneil Pinal is Medical Director for [facility] Cardiac Rehabilitation.  Dr. Fuad Aleskerov is Medical Director for [facility] Pulmonary Rehabilitation.

## 2023-12-29 ENCOUNTER — Other Ambulatory Visit: Payer: Self-pay | Admitting: *Deleted

## 2023-12-29 DIAGNOSIS — Z87891 Personal history of nicotine dependence: Secondary | ICD-10-CM

## 2023-12-29 DIAGNOSIS — Z122 Encounter for screening for malignant neoplasm of respiratory organs: Secondary | ICD-10-CM

## 2024-01-02 ENCOUNTER — Encounter

## 2024-01-02 DIAGNOSIS — J449 Chronic obstructive pulmonary disease, unspecified: Secondary | ICD-10-CM

## 2024-01-02 NOTE — Progress Notes (Signed)
 Daily Session Note  Patient Details  Name: Taylor Chambers MRN: [id number] Date of Birth: [birth year] Referring Provider:   [facility] from 11/09/2023 in [facility] Cardiac and Pulmonary Rehab  Referring Provider Dr. Halina Picking, MD    Encounter Date: 01/02/2024  Check In:  Session Check In - 01/02/24 0905       Check-In   Supervising physician immediately available to respond to emergencies See telemetry face sheet for immediately available ER MD    Location [facility]-Cardiac & Pulmonary Rehab    Staff Present Burnard Davenport RN,BSN,MPA;Maxon Conetta BS, Exercise Physiologist;Margaret Best, MS, Exercise Physiologist;Jason Elnor RDN,LDN;Noah Tickle, BS, Exercise Physiologist    Virtual Visit No    Medication changes reported     No    Fall or balance concerns reported    No    Warm-up and Cool-down Performed on first and last piece of equipment    Resistance Training Performed Yes    VAD Patient? No    PAD/SET Patient? No      Pain Assessment   Currently in Pain? No/denies             Social History   Tobacco Use  Smoking Status Former   Current packs/day: 0.00   Average packs/day: 0.3 packs/day for 50.0 years (12.5 ttl pk-yrs)   Types: Cigarettes   Start date: 01/10/1972   Quit date: 01/09/2022   Years since quitting: 1.9  Smokeless Tobacco Never    Goals Met:  Proper associated with RPD/PD & O2 Sat Independence with exercise equipment Using PLB without cueing & demonstrates good technique Exercise tolerated well No report of concerns or symptoms today Strength training completed today  Goals Unmet:  Not Applicable  Comments: Pt able to follow exercise prescription today without complaint.  Will continue to monitor for progression.    Dr. Oneil Pinal is Medical Director for [facility] Cardiac Rehabilitation.  Dr. Fuad Aleskerov is Medical Director for [facility] Pulmonary Rehabilitation.

## 2024-01-03 DIAGNOSIS — J449 Chronic obstructive pulmonary disease, unspecified: Secondary | ICD-10-CM

## 2024-01-03 NOTE — Progress Notes (Signed)
 Pulmonary Individual Treatment Plan  Patient Details  Name: Taylor Chambers MRN: [id number] Date of Birth: 30-Jun-1945 Referring Provider:   Conrad Ports Pulmonary Rehab from 11/09/2023 in [facility] Cardiac and Pulmonary Rehab  Referring Provider Dr. Halina Picking, MD    Initial Encounter Date:  Flowsheet Row Pulmonary Rehab from 11/09/2023 in [facility] Cardiac and Pulmonary Rehab  Date 11/09/23    Visit Diagnosis: Chronic obstructive pulmonary disease, unspecified COPD type (HCC)  Patient's Home Medications on Admission:  Current Outpatient Medications:    albuterol  (VENTOLIN  HFA) 108 (90 Base) MCG/ACT inhaler, Inhale 2 puffs into the lungs every 6 (six) hours as needed for wheezing or shortness of breath., Disp: 8 g, Rfl: 2   apixaban  (ELIQUIS ) 5 MG TABS tablet, TAKE 1 TABLET BY MOUTH TWICE A DAY, Disp: 180 tablet, Rfl: 1   budesonide  (PULMICORT ) 0.25 MG/2ML nebulizer solution, SMARTSIG:2 Milliliter(s) Twice Daily (Patient not taking: Reported on 12/19/2023), Disp: , Rfl:    Calcium Carbonate (CALCIUM 500 PO), Take 500 mg by mouth daily at 12 noon. (Patient not taking: Reported on 12/19/2023), Disp: , Rfl:    Cholecalciferol  (VITAMIN D3) 1.25 MG (50000 UT) CAPS, Take 5,000 Units by mouth daily in the afternoon., Disp: , Rfl:    COMBIVENT  RESPIMAT 20-100 MCG/ACT AERS respimat, Inhale into the lungs. (Patient not taking: Reported on 12/19/2023), Disp: , Rfl:    cyclobenzaprine (FLEXERIL) 10 MG tablet, Take 10 mg by mouth 2 (two) times daily as needed for muscle spasms. (Patient not taking: Reported on 12/19/2023), Disp: , Rfl:    divalproex  (DEPAKOTE ) 250 MG DR tablet, Take 250 mg by mouth., Disp: , Rfl:    Dupilumab  300 MG/2ML SOAJ, Inject 300 mg into the skin every 14 (fourteen) days. Saturday (Patient not taking: Reported on 12/19/2023), Disp: , Rfl:    DUPIXENT  300 MG/2ML prefilled syringe, Inject 300 mg into the skin., Disp: , Rfl:    EPINEPHrine 0.3 mg/0.3 mL IJ SOAJ injection, Inject 0.3 mg into  the muscle as needed for anaphylaxis., Disp: , Rfl:    furosemide  (LASIX ) 40 MG tablet, Take 1 tablet (40 mg total) by mouth daily. (Patient not taking: Reported on 12/19/2023), Disp: 30 tablet, Rfl: 0   ipratropium (ATROVENT ) 0.06 % nasal spray, Place 1 spray into both nostrils daily., Disp: , Rfl:    ipratropium-albuterol  (DUONEB) 0.5-2.5 (3) MG/3ML SOLN, Take 3 mLs by nebulization 2 (two) times daily. Mix with budesonide  (Patient not taking: Reported on 12/19/2023), Disp: , Rfl:    ipratropium-albuterol  (DUONEB) 0.5-2.5 (3) MG/3ML SOLN, Inhale 3 mLs into the lungs. (Patient not taking: Reported on 12/19/2023), Disp: , Rfl:    losartan  (COZAAR ) 100 MG tablet, Take 1 tablet (100 mg total) by mouth daily., Disp: 90 tablet, Rfl: 3   metoprolol  succinate (TOPROL -XL) 25 MG 24 hr tablet, Take 1 tablet (25 mg total) by mouth 2 (two) times daily. (Patient not taking: Reported on 12/19/2023), Disp: 60 tablet, Rfl: 0   Multiple Vitamin (MULTI-VITAMIN) tablet, Take 1 tablet by mouth daily., Disp: , Rfl:    Multiple Vitamins-Minerals (PRESERVISION AREDS 2+MULTI VIT PO), Take 2 tablets by mouth daily. (Patient not taking: Reported on 12/19/2023), Disp: , Rfl:    OHTUVAYRE  3 MG/2.5ML SUSP, Take 3 mg by nebulization daily. (Patient not taking: Reported on 12/19/2023), Disp: , Rfl:    omeprazole (PRILOSEC) 40 MG capsule, Take 40 mg by mouth daily as needed (Heartburn). (Patient not taking: Reported on 12/19/2023), Disp: , Rfl:    oxybutynin (DITROPAN) 5 MG tablet,  Take 5 mg by mouth. (Patient not taking: Reported on 12/19/2023), Disp: , Rfl:    Potassium Chloride  40 MEQ/15ML (20%) SOLN, Take 20 mEq by mouth. (Patient not taking: Reported on 12/19/2023), Disp: , Rfl:    potassium chloride  SA (KLOR-CON  M) 20 MEQ tablet, Take 1 tablet (20 mEq total) by mouth daily. (Patient not taking: Reported on 12/19/2023), Disp: 90 tablet, Rfl: 3   pravastatin  (PRAVACHOL ) 10 MG tablet, Take 10 mg by mouth daily., Disp: , Rfl:    sertraline   (ZOLOFT ) 100 MG tablet, Take 1 tablet (100 mg total) by mouth daily. (Patient not taking: Reported on 12/19/2023), Disp: 30 tablet, Rfl: 11   sulfamethoxazole -trimethoprim  (BACTRIM ) 400-80 MG tablet, Take 1 tablet by mouth 3 (three) times a week., Disp: , Rfl:   Past Medical History: Past Medical History:  Diagnosis Date   Allergies    Anxiety    Aortic atherosclerosis (HCC)    Bipolar affective disorder (HCC)    COPD (chronic obstructive pulmonary disease) (HCC)    Coronary artery calcification seen on CT scan    Depression    Diastolic dysfunction 02/24/2020   a.) TTE 02/24/2020: EF >55%; triv PR, mild TR, mod MR; G1DD.   DOE (dyspnea on exertion)    Emphysema lung (HCC)    History of cataract    HLD (hyperlipidemia)    Hypertension    Macular degeneration    Migraines    Osteoporosis    Pneumonia    Tobacco use     Tobacco Use: Social History   Tobacco Use  Smoking Status Former   Current packs/day: 0.00   Average packs/day: 0.3 packs/day for 50.0 years (12.5 ttl pk-yrs)   Types: Cigarettes   Start date: 01/10/1972   Quit date: 01/09/2022   Years since quitting: 1.9  Smokeless Tobacco Never    Labs: Review Flowsheet  More data exists      Latest Ref Rng & Units 06/27/2023 06/29/2023 07/02/2023 07/17/2023 08/21/2023  Labs for ITP Cardiac and Pulmonary Rehab  PH, Arterial 7.35 - 7.45 7.44  - 7.6  7.4  -  PCO2 arterial 32 - 48 mmHg 46  - 47  33  -  Bicarbonate 20.0 - 28.0 mmol/L 31.2  26.8  26.6  46.1  20.4  25.0   Acid-base deficit 0.0 - 2.0 mmol/L - 0.1  4.5  - 3.6  3.1   O2 Saturation % 99.9  95  55.1  97.1  89.2  90.4     Details       Multiple values from one day are sorted in reverse-chronological order          Pulmonary Assessment Scores:  Pulmonary Assessment Scores     Row Name 11/09/23 1110 11/16/23 0936       ADL UCSD   ADL Phase -- Entry    SOB Score total -- 53    Rest -- 1    Walk -- 0    Stairs -- 5    Bath -- 0    Dress -- 0    Shop  -- 2      CAT Score   CAT Score -- 19      mMRC Score   mMRC Score 2 2       UCSD: Self-administered rating of dyspnea associated with activities of daily living (ADLs) 6-point scale (0 = not at all to 5 = maximal or unable to do because of breathlessness)  Scoring Scores range from 0 to  120.  Minimally important difference is 5 units  CAT: CAT can identify the health impairment of COPD patients and is better correlated with disease progression.  CAT has a scoring range of zero to 40. The CAT score is classified into four groups of low (less than 10), medium (10 - 20), high (21-30) and very high (31-40) based on the impact level of disease on health status. A CAT score over 10 suggests significant symptoms.  A worsening CAT score could be explained by an exacerbation, poor medication adherence, poor inhaler technique, or progression of COPD or comorbid conditions.  CAT MCID is 2 points  mMRC: mMRC (Modified Medical Research Council) Dyspnea Scale is used to assess the degree of baseline functional disability in patients of respiratory disease due to dyspnea. No minimal important difference is established. A decrease in score of 1 point or greater is considered a positive change.   Pulmonary Function Assessment:  Pulmonary Function Assessment - 11/08/23 1429       Breath   Shortness of Breath Yes;Fear of Shortness of Breath;Limiting activity;Panic with Shortness of Breath          Exercise Target Goals: Exercise Program Goal: Individual exercise prescription set using results from initial 6 min walk test and THRR while considering  patient's activity barriers and safety.   Exercise Prescription Goal: Initial exercise prescription builds to 30-45 minutes a day of aerobic activity, 2-3 days per week.  Home exercise guidelines will be given to patient during program as part of exercise prescription that the participant will acknowledge.  Education: Aerobic Exercise: - Group  verbal and visual presentation on the components of exercise prescription. Introduces F.I.T.T principle from ACSM for exercise prescriptions.  Reviews F.I.T.T. principles of aerobic exercise including progression. Written material given at graduation.   Education: Resistance Exercise: - Group verbal and visual presentation on the components of exercise prescription. Introduces F.I.T.T principle from ACSM for exercise prescriptions  Reviews F.I.T.T. principles of resistance exercise including progression. Written material given at graduation.    Education: Exercise & Equipment Safety: - Individual verbal instruction and demonstration of equipment use and safety with use of the equipment. Flowsheet Row Pulmonary Rehab from 11/08/2023 in [facility] Cardiac and Pulmonary Rehab  Date 11/08/23  Educator [facility]  Instruction Review Code 1- Verbalizes Understanding    Education: Exercise Physiology & General Exercise Guidelines: - Group verbal and written instruction with models to review the exercise physiology of the cardiovascular system and associated critical values. Provides general exercise guidelines with specific guidelines to those with heart or lung disease.    Education: Flexibility, Balance, Mind/Body Relaxation: - Group verbal and visual presentation with interactive activity on the components of exercise prescription. Introduces F.I.T.T principle from ACSM for exercise prescriptions. Reviews F.I.T.T. principles of flexibility and balance exercise training including progression. Also discusses the mind body connection.  Reviews various relaxation techniques to help reduce and manage stress (i.e. Deep breathing, progressive muscle relaxation, and visualization). Balance handout provided to take home. Written material given at graduation.   Activity Barriers & Risk Stratification:  Activity Barriers & Cardiac Risk Stratification - 11/09/23 1123       Activity Barriers & Cardiac Risk Stratification    Activity Barriers Right Hip Replacement;Other (comment);Assistive Device;Shortness of Breath;History of Falls;Balance Concerns    Comments osteoporosis          6 Minute Walk:  6 Minute Walk     Row Name 11/09/23 1117         6 Minute  Walk   Phase Initial     Distance 455 feet     Walk Time 6 minutes     # of Rest Breaks 0     MPH 0.86     METS 1.68     RPE 15     Perceived Dyspnea  4     VO2 Peak 5.88     Symptoms Yes (comment)     Comments used walker     Resting HR 85 bpm     Resting BP 132/68     Resting Oxygen Saturation  95 %     Exercise Oxygen Saturation  during 6 min walk 90 %     Max Ex. HR 105 bpm     Max Ex. BP 154/74     2 Minute Post BP 142/72       Interval HR   1 Minute HR 102     2 Minute HR 105     3 Minute HR 104     4 Minute HR 105     5 Minute HR 101     6 Minute HR 98     2 Minute Post HR 93     Interval Heart Rate? Yes       Interval Oxygen   Interval Oxygen? Yes     Baseline Oxygen Saturation % 95 %     1 Minute Oxygen Saturation % 91 %     1 Minute Liters of Oxygen 3 L  pulsed     2 Minute Oxygen Saturation % 92 %     2 Minute Liters of Oxygen 3 L     3 Minute Oxygen Saturation % 91 %     3 Minute Liters of Oxygen 3 L     4 Minute Oxygen Saturation % 90 %     4 Minute Liters of Oxygen 3 L     5 Minute Oxygen Saturation % 91 %     5 Minute Liters of Oxygen 3 L     6 Minute Oxygen Saturation % 91 %     6 Minute Liters of Oxygen 3 L     2 Minute Post Oxygen Saturation % 95 %     2 Minute Post Liters of Oxygen 3 L       Oxygen Initial Assessment:  Oxygen Initial Assessment - 11/08/23 1428       Home Oxygen   Home Oxygen Device None    Sleep Oxygen Prescription Continuous;BiPAP    Liters per minute 3    Home Exercise Oxygen Prescription None    Home Resting Oxygen Prescription None    Compliance with Home Oxygen Use Yes      Initial 6 min Walk   Oxygen Used None      Program Oxygen Prescription   Program Oxygen  Prescription None      Intervention   Short Term Goals To learn and demonstrate proper pursed lip breathing techniques or other breathing techniques. ;To learn and understand importance of monitoring SPO2 with pulse oximeter and demonstrate accurate use of the pulse oximeter.;To learn and exhibit compliance with exercise, home and travel O2 prescription;To learn and understand importance of maintaining oxygen saturations>88%    Long  Term Goals Verbalizes importance of monitoring SPO2 with pulse oximeter and return demonstration;Exhibits proper breathing techniques, such as pursed lip breathing or other method taught during program session;Demonstrates proper use of MDI's;Exhibits compliance with exercise, home  and travel O2 prescription;Maintenance of  O2 saturations>88%;Compliance with respiratory medication          Oxygen Re-Evaluation:  Oxygen Re-Evaluation     Row Name 11/16/23 0946 12/14/23 0948           Program Oxygen Prescription   Program Oxygen Prescription None Continuous;E-Tanks      Liters per minute -- 3        Home Oxygen   Home Oxygen Device None E-Tanks;Home Concentrator;Portable Concentrator      Sleep Oxygen Prescription Continuous;BiPAP BiPAP      Liters per minute 3 3      Home Exercise Oxygen Prescription None Continuous      Liters per minute -- 3      Home Resting Oxygen Prescription None None      Compliance with Home Oxygen Use Yes Yes        Goals/Expected Outcomes   Short Term Goals To learn and demonstrate proper pursed lip breathing techniques or other breathing techniques.  To learn and demonstrate proper pursed lip breathing techniques or other breathing techniques.       Long  Term Goals Exhibits proper breathing techniques, such as pursed lip breathing or other method taught during program session Exhibits proper breathing techniques, such as pursed lip breathing or other method taught during program session      Comments Reviewed PLB technique  with pt.  Talked about how it works and it's importance in maintaining their exercise saturations. Informed patient how to perform the Pursed Lipped breathing technique. Told patient to Inhale through the nose and out the mouth with pursed lips to keep their airways open, help oxygenate them better, practice when at rest or doing strenuous activity. Patient Verbalizes understanding of technique and will work on and be reiterated during [facility].      Goals/Expected Outcomes Short: Become more profiecient at using PLB. Long: Become independent at using PLB. Short: use PLB with exertion. Long: use PLB on exertion proficiently and independently.         Oxygen Discharge (Final Oxygen Re-Evaluation):  Oxygen Re-Evaluation - 12/14/23 0948       Program Oxygen Prescription   Program Oxygen Prescription Continuous;E-Tanks    Liters per minute 3      Home Oxygen   Home Oxygen Device E-Tanks;Home Concentrator;Portable Concentrator    Sleep Oxygen Prescription BiPAP    Liters per minute 3    Home Exercise Oxygen Prescription Continuous    Liters per minute 3    Home Resting Oxygen Prescription None    Compliance with Home Oxygen Use Yes      Goals/Expected Outcomes   Short Term Goals To learn and demonstrate proper pursed lip breathing techniques or other breathing techniques.     Long  Term Goals Exhibits proper breathing techniques, such as pursed lip breathing or other method taught during program session    Comments Informed patient how to perform the Pursed Lipped breathing technique. Told patient to Inhale through the nose and out the mouth with pursed lips to keep their airways open, help oxygenate them better, practice when at rest or doing strenuous activity. Patient Verbalizes understanding of technique and will work on and be reiterated during [facility].    Goals/Expected Outcomes Short: use PLB with exertion. Long: use PLB on exertion proficiently and independently.           Initial Exercise Prescription:  Initial Exercise Prescription - 11/09/23 1100       Date of Initial Exercise RX and Referring Provider  Date 11/09/23    Referring Provider Dr. Fuad Aleskerov, MD      Oxygen   Oxygen Continuous    Liters 3    Maintain Oxygen Saturation 88% or higher      Recumbant Bike   Level 1    RPM 50    Watts 15    Minutes 15    METs 1.68      NuStep   Level 1    SPM 80    Minutes 15    METs 1.68      Track   Laps 12    Minutes 15    METs 1.65      Prescription Details   Frequency (times per week) 2    Duration Progress to 30 minutes of continuous aerobic without signs/symptoms of physical distress      Intensity   THRR 40-80% of Max Heartrate 108-132    Ratings of Perceived Exertion 11-13    Perceived Dyspnea 0-4      Progression   Progression Continue to progress workloads to maintain intensity without signs/symptoms of physical distress.      Resistance Training   Training Prescription Yes    Weight 2 lb    Reps 10-15          Perform Capillary Blood Glucose checks as needed.  Exercise Prescription Changes:   Exercise Prescription Changes     Row Name 11/09/23 1100 11/21/23 1400 12/06/23 1300 12/12/23 1000 12/20/23 1600     Response to Exercise   Blood Pressure (Admit) 132/68 132/70 132/60 -- 118/64   Blood Pressure (Exercise) 154/74 148/68 156/64 -- 160/70   Blood Pressure (Exit) 142/72 126/68 120/66 -- 128/60   Heart Rate (Admit) 85 bpm 74 bpm 81 bpm -- 95 bpm   Heart Rate (Exercise) 105 bpm 98 bpm 110 bpm -- 103 bpm   Heart Rate (Exit) 93 bpm 90 bpm 72 bpm -- 87 bpm   Oxygen Saturation (Admit) 95 % 98 % 96 % -- 90 %   Oxygen Saturation (Exercise) 90 % 92 % 89 % -- 98 %   Oxygen Saturation (Exit) 95 % 93 % 91 % -- 94 %   Rating of Perceived Exertion (Exercise) 15 15 14  -- 15   Perceived Dyspnea (Exercise) 4 3 2  -- 3   Symptoms used walker none none -- none   Comments Results 1st day of exercise -- -- --    Duration -- Continue with 30 min of aerobic exercise without signs/symptoms of physical distress. Continue with 30 min of aerobic exercise without signs/symptoms of physical distress. -- Continue with 30 min of aerobic exercise without signs/symptoms of physical distress.   Intensity -- THRR unchanged THRR unchanged -- THRR unchanged     Progression   Progression -- Continue to progress workloads to maintain intensity without signs/symptoms of physical distress. Continue to progress workloads to maintain intensity without signs/symptoms of physical distress. -- Continue to progress workloads to maintain intensity without signs/symptoms of physical distress.   Average METs -- 2.4 1.81 -- 1.48     Resistance Training   Training Prescription -- Yes Yes -- Yes   Weight -- 2 lb 2 lb -- 2 lb   Reps -- 10-15 10-15 -- 10-15     Interval Training   Interval Training -- No No -- No     Oxygen   Oxygen -- Continuous Continuous -- Continuous   Liters -- 2 2 -- 2-3  Recumbant Bike   Level -- 1 -- -- --   Watts -- 15 -- -- --   Minutes -- 15 -- -- --   METs -- 2.99 -- -- --     NuStep   Level -- 1 2 -- 1   Minutes -- 15 15 -- 15   METs -- 1.8 1.6 -- 1.5     Arm Ergometer   Level -- -- -- -- 1   Minutes -- -- -- -- 15   METs -- -- -- -- 1     Track   Laps -- -- 32 -- 24   Minutes -- -- 15 -- 15   METs -- -- 2.74 -- 2.31     Home Exercise Plan   Plans to continue exercise at -- -- -- Home (comment)  walking, resistance w/ weights, keeping up with 82 year old grandbaby Home (comment)  walking, resistance w/ weights, keeping up with 58 year old grandbaby   Frequency -- -- -- Add 3 additional days to program exercise sessions. Add 3 additional days to program exercise sessions.   Initial Home Exercises Provided -- -- -- 12/12/23 12/12/23     Oxygen   Maintain Oxygen Saturation -- 88% or higher 88% or higher -- 88% or higher    Row Name 01/02/24 1400             Response to  Exercise   Blood Pressure (Admit) 124/70       Blood Pressure (Exit) 130/60       Heart Rate (Admit) 97 bpm       Heart Rate (Exercise) 111 bpm       Heart Rate (Exit) 95 bpm       Oxygen Saturation (Admit) 93 %       Oxygen Saturation (Exercise) 88 %       Oxygen Saturation (Exit) 93 %       Rating of Perceived Exertion (Exercise) 17       Perceived Dyspnea (Exercise) 4       Symptoms none       Duration Continue with 30 min of aerobic exercise without signs/symptoms of physical distress.       Intensity THRR unchanged         Progression   Progression Continue to progress workloads to maintain intensity without signs/symptoms of physical distress.       Average METs 2.04         Resistance Training   Training Prescription Yes       Weight 2 lb       Reps 10-15         Interval Training   Interval Training No         Oxygen   Oxygen Continuous       Liters 3         NuStep   Level 1       Minutes 15       METs 1.6         Track   Laps 52       Minutes 15       METs 3.8         Home Exercise Plan   Plans to continue exercise at Home (comment)  walking, resistance w/ weights, keeping up with 71 year old grandbaby       Frequency Add 3 additional days to program exercise sessions.       Initial Home Exercises Provided  12/12/23         Oxygen   Maintain Oxygen Saturation 88% or higher          Exercise Comments:   Exercise Comments     Row Name 11/16/23 0945           Exercise Comments First full day of exercise!  Patient was oriented to gym and equipment including functions, settings, policies, and procedures.  Patient's individual exercise prescription and treatment plan were reviewed.  All starting workloads were established based on the results of the 6 minute walk test done at initial orientation visit.  The plan for exercise progression was also introduced and progression will be customized based on patient's performance and goals.          Exercise  Goals and Review:   Exercise Goals     Row Name 11/09/23 1124             Exercise Goals   Increase Physical Activity Yes       Intervention Provide advice, education, support and counseling about physical activity/exercise needs.;Develop an individualized exercise prescription for aerobic and resistive training based on initial evaluation findings, risk stratification, comorbidities and participant's personal goals.       Expected Outcomes Short Term: Attend rehab on a regular basis to increase amount of physical activity.;Long Term: Exercising regularly at least 3-5 days a week.;Long Term: Add in home exercise to make exercise part of routine and to increase amount of physical activity.       Increase Strength and Stamina Yes       Intervention Develop an individualized exercise prescription for aerobic and resistive training based on initial evaluation findings, risk stratification, comorbidities and participant's personal goals.;Provide advice, education, support and counseling about physical activity/exercise needs.       Expected Outcomes Short Term: Increase workloads from initial exercise prescription for resistance, speed, and METs.;Short Term: Perform resistance training exercises routinely during rehab and add in resistance training at home;Long Term: Improve cardiorespiratory fitness, muscular endurance and strength as measured by increased METs and functional capacity ( )       Able to understand and use rate of perceived exertion (RPE) scale Yes       Intervention Provide education and explanation on how to use RPE scale       Expected Outcomes Short Term: Able to use RPE daily in rehab to express subjective intensity level;Long Term:  Able to use RPE to guide intensity level when exercising independently       Able to understand and use Dyspnea scale Yes       Intervention Provide education and explanation on how to use Dyspnea scale       Expected Outcomes Short Term: Able to  use Dyspnea scale daily in rehab to express subjective sense of shortness of breath during exertion;Long Term: Able to use Dyspnea scale to guide intensity level when exercising independently       Knowledge and understanding of Target Heart Rate Range (THRR) Yes       Intervention Provide education and explanation of THRR including how the numbers were predicted and where they are located for reference       Expected Outcomes Short Term: Able to state/look up THRR;Long Term: Able to use THRR to govern intensity when exercising independently;Short Term: Able to use daily as guideline for intensity in rehab       Able to check pulse independently Yes       Intervention Provide  education and demonstration on how to check pulse in carotid and radial arteries.;Review the importance of being able to check your own pulse for safety during independent exercise       Expected Outcomes Short Term: Able to explain why pulse checking is important during independent exercise;Long Term: Able to check pulse independently and accurately       Understanding of Exercise Prescription Yes       Intervention Provide education, explanation, and written materials on patient's individual exercise prescription       Expected Outcomes Short Term: Able to explain program exercise prescription;Long Term: Able to explain home exercise prescription to exercise independently          Exercise Goals Re-Evaluation :  Exercise Goals Re-Evaluation     Row Name 11/16/23 0945 11/21/23 1450 12/06/23 1350 12/12/23 1033 12/20/23 1621     Exercise Goal Re-Evaluation   Exercise Goals Review Able to understand and use rate of perceived exertion (RPE) scale;Knowledge and understanding of Target Heart Rate Range (THRR);Able to understand and use Dyspnea scale;Understanding of Exercise Prescription Increase Physical Activity;Understanding of Exercise Prescription;Increase Strength and Stamina Increase Physical Activity;Understanding of  Exercise Prescription;Increase Strength and Stamina Increase Physical Activity;Able to understand and use rate of perceived exertion (RPE) scale;Knowledge and understanding of Target Heart Rate Range (THRR);Understanding of Exercise Prescription;Increase Strength and Stamina;Able to understand and use Dyspnea scale;Able to check pulse independently Increase Physical Activity;Understanding of Exercise Prescription;Increase Strength and Stamina   Comments eviewed RPE and dyspnea scale, THR and program prescription with pt today.  Pt voiced understanding and was given a copy of goals to take home. Laurine is off to a good start in the program and completed her first day in this review. She tolerated her exercise prescription well with level 1 on the T4 nustep and level 1 on the recumbent bike. She used 2 lb weights for resistance. We will continue to monitor her progress in the program. Elena is doing well in rehab. She was recently able to increase from level 1 to 2 on the T4 nustep. She was also able to walk 32 laps on the track. We will continue to monitor her progress in the program. Reviewed home exercise with pt today from 9:25 to 9:35am.  Pt plans to walk for aerobic and use weights for resistance at home and keep up with her 64 year old great grandbaby for exercise. She plans to add 3 additional days at home. Reviewed THR, pulse, RPE, sign and symptoms, pulse oximetery and when to call 911 or MD.  Also discussed weather considerations and indoor options.  Pt voiced understanding. Alyvia is doing well in rehab. She was able to use the arm ergometer at level 1. She worked at level 1 on the T4 nustep and walked 24 laps on the track. We will continue to monitor her progress in the program.   Expected Outcomes Short: Use RPE daily to regulate intensity. Long: Follow program prescription in THR. Short: Continue to follow current exercise prescription. Long: Continue exercise to improve strength and stamina. Short:  Continue to follow current exercise prescription. Long: Continue exercise to improve strength and stamina. Short: Add 3 additional days of exercise at home. Long: Continue to exercise at home independently. Short: Try level 2 on the T4 nustep and push for more laps on the track. Long: Continue exercise to improve strength and stamina.    Row Name 01/02/24 1500             Exercise  Goal Re-Evaluation   Exercise Goals Review Increase Physical Activity;Understanding of Exercise Prescription;Increase Strength and Stamina       Comments Dorlisa is doing well in rehab. She increased her laps on the track to 30 and 52 laps. She maintained level 1 on the T4 nustep. We will continue to monitor her progress in the program.       Expected Outcomes Short: Try level 2 on the T4 nustep. Long: Continue exercise to improve strength and stamina.          Discharge Exercise Prescription (Final Exercise Prescription Changes):  Exercise Prescription Changes - 01/02/24 1400       Response to Exercise   Blood Pressure (Admit) 124/70    Blood Pressure (Exit) 130/60    Heart Rate (Admit) 97 bpm    Heart Rate (Exercise) 111 bpm    Heart Rate (Exit) 95 bpm    Oxygen Saturation (Admit) 93 %    Oxygen Saturation (Exercise) 88 %    Oxygen Saturation (Exit) 93 %    Rating of Perceived Exertion (Exercise) 17    Perceived Dyspnea (Exercise) 4    Symptoms none    Duration Continue with 30 min of aerobic exercise without signs/symptoms of physical distress.    Intensity THRR unchanged      Progression   Progression Continue to progress workloads to maintain intensity without signs/symptoms of physical distress.    Average METs 2.04      Resistance Training   Training Prescription Yes    Weight 2 lb    Reps 10-15      Interval Training   Interval Training No      Oxygen   Oxygen Continuous    Liters 3      NuStep   Level 1    Minutes 15    METs 1.6      Track   Laps 52    Minutes 15    METs 3.8       Home Exercise Plan   Plans to continue exercise at Home (comment)   walking, resistance w/ weights, keeping up with 33 year old grandbaby   Frequency Add 3 additional days to program exercise sessions.    Initial Home Exercises Provided 12/12/23      Oxygen   Maintain Oxygen Saturation 88% or higher          Nutrition:  Target Goals: Understanding of nutrition guidelines, daily intake of sodium 1500mg , cholesterol 200mg , calories 30% from fat and 7% or less from saturated fats, daily to have 5 or more servings of fruits and vegetables.  Education: All About Nutrition: -Group instruction provided by verbal, written material, interactive activities, discussions, models, and posters to present general guidelines for heart healthy nutrition including fat, fiber, MyPlate, the role of sodium in heart healthy nutrition, utilization of the nutrition label, and utilization of this knowledge for meal planning. Follow up email sent as well. Written material given at graduation.   Biometrics:  Pre Biometrics - 11/09/23 1124       Pre Biometrics   Height 5' 2 (1.575 m)    Weight 100 lb 11.2 oz (45.7 kg)    Waist Circumference 26.5 inches    Hip Circumference 33.5 inches    Waist to Hip Ratio 0.79 %    BMI (Calculated) 18.41    Single Leg Stand 1.6 seconds           Nutrition Therapy Plan and Nutrition Goals:  Nutrition Therapy & Goals -  11/09/23 1133       Nutrition Therapy   RD appointment deferred Yes      Intervention Plan   Intervention Prescribe, educate and counsel regarding individualized specific dietary modifications aiming towards targeted core components such as weight, hypertension, lipid management, diabetes, heart failure and other comorbidities.    Expected Outcomes Short Term Goal: Understand basic principles of dietary content, such as calories, fat, sodium, cholesterol and nutrients.;Short Term Goal: A plan has been developed with personal nutrition goals  set during dietitian appointment.;Long Term Goal: Adherence to prescribed nutrition plan.          Nutrition Assessments:  MEDIFICTS Score Key: >=70 Need to make dietary changes  40-70 Heart Healthy Diet <= 40 Therapeutic Level Cholesterol Diet  Flowsheet Row Pulmonary Rehab from 11/16/2023 in [facility]  Picture Your Plate Total Score on Admission 53   Picture Your Plate Scores: <59 Unhealthy dietary pattern with much room for improvement. 41-50 Dietary pattern unlikely to meet recommendations for good health and room for improvement. 51-60 More healthful dietary pattern, with some room for improvement.  >60 Healthy dietary pattern, although there may be some specific behaviors that could be improved.   Nutrition Goals Re-Evaluation:  Nutrition Goals Re-Evaluation     Row Name 12/14/23 [phone]             Goals   Comment Patient was informed on why it is important to maintain a balanced diet when dealing with Respiratory issues. Explained that it takes a lot of energy to breath and when they are short of breath often they will need to have a good diet to help keep up with the calories they are expending for breathing.       Expected Outcome Short: Choose and plan snacks accordingly to patients caloric intake to improve breathing. Long: Maintain a diet independently that meets their caloric intake to aid in daily shortness of breath.          Nutrition Goals Discharge (Final Nutrition Goals Re-Evaluation):  Nutrition Goals Re-Evaluation - 12/14/23 0952       Goals   Comment Patient was informed on why it is important to maintain a balanced diet when dealing with Respiratory issues. Explained that it takes a lot of energy to breath and when they are short of breath often they will need to have a good diet to help keep up with the calories they are expending for breathing.    Expected Outcome Short: Choose and plan snacks accordingly to patients caloric  intake to improve breathing. Long: Maintain a diet independently that meets their caloric intake to aid in daily shortness of breath.          Psychosocial: Target Goals: Acknowledge presence or absence of significant depression and/or stress, maximize coping skills, provide positive support system. Participant is able to verbalize types and ability to use techniques and skills needed for reducing stress and depression.   Education: Stress, Anxiety, and Depression - Group verbal and visual presentation to define topics covered.  Reviews how body is impacted by stress, anxiety, and depression.  Also discusses healthy ways to reduce stress and to treat/manage anxiety and depression.  Written material given at graduation.   Education: Sleep Hygiene -Provides group verbal and written instruction about how sleep can affect your health.  Define sleep hygiene, discuss sleep cycles and impact of sleep habits. Review good sleep hygiene tips.    Initial Review & Psychosocial Screening:  Initial Psych Review &  Screening - 11/08/23 1430       Initial Review   Current issues with Current Psychotropic Meds      Family Dynamics   Good Support System? Yes    Comments Katelan sometimes gets depressed because she cannot breatj at times. She is unable to things that she wants to due to her COPD and she takes Zoloft  to help. She can look to her husband and grandchildren for support.      Barriers   Psychosocial barriers to participate in program The patient should benefit from training in stress management and relaxation.;Psychosocial barriers identified (see note)      Screening Interventions   Interventions Encouraged to exercise;Provide feedback about the scores to participant;To provide support and resources with identified psychosocial needs    Expected Outcomes Short Term goal: Utilizing psychosocial counselor, staff and physician to assist with identification of specific Stressors or current issues  interfering with healing process. Setting desired goal for each stressor or current issue identified.;Long Term Goal: Stressors or current issues are controlled or eliminated.;Short Term goal: Identification and review with participant of any Quality of Life or Depression concerns found by scoring the questionnaire.;Long Term goal: The participant improves quality of Life and PHQ9 Scores as seen by post scores and/or verbalization of changes          Quality of Life Scores:  Scores of 19 and below usually indicate a poorer quality of life in these areas.  A difference of  2-3 points is a clinically meaningful difference.  A difference of 2-3 points in the total score of the Quality of Life Index has been associated with significant improvement in overall quality of life, self-image, physical symptoms, and general health in studies assessing change in quality of life.  PHQ-9: Review Flowsheet       11/09/2023  Depression screen PHQ 2/9  Decreased Interest 0  Down, Depressed, Hopeless 1  PHQ - 2 Score 1  Altered sleeping 3  Tired, decreased energy 3  Change in appetite 2  Feeling bad or failure about yourself  3  Trouble concentrating 3  Moving slowly or fidgety/restless 3  Suicidal thoughts 2  PHQ-9 Score 20  Difficult doing work/chores Extremely dIfficult   Interpretation of Total Score  Total Score Depression Severity:  1-4 = Minimal depression, 5-9 = Mild depression, 10-14 = Moderate depression, 15-19 = Moderately severe depression, 20-27 = Severe depression   Psychosocial Evaluation and Intervention:  Psychosocial Evaluation - 11/08/23 1433       Psychosocial Evaluation & Interventions   Interventions Encouraged to exercise with the program and follow exercise prescription;Relaxation education;Stress management education    Comments Leigh sometimes gets depressed because she cannot breatj at times. She is unable to things that she wants to due to her COPD and she takes Zoloft   to help. She can look to her husband and grandchildren for support.    Expected Outcomes Short: Start [facility] to help with mood. Long: Maintain a healthy mental state    Continue Psychosocial Services  Follow up required by staff          Psychosocial Re-Evaluation:   Psychosocial Discharge (Final Psychosocial Re-Evaluation):   Education: Education Goals: Education classes will be provided on a weekly basis, covering required topics. Participant will state understanding/return demonstration of topics presented.  Learning Barriers/Preferences:  Learning Barriers/Preferences - 11/08/23 1430       Learning Barriers/Preferences   Learning Barriers None    Learning Preferences None  General Pulmonary Education Topics:  Infection Prevention: - Provides verbal and written material to individual with discussion of infection control including proper hand washing and proper equipment cleaning during exercise session. Flowsheet Row Pulmonary Rehab from 11/08/2023 in [facility]  Date 11/08/23  Educator [facility]  Instruction Review Code 1- Verbalizes Understanding    Falls Prevention: - Provides verbal and written material to individual with discussion of falls prevention and safety. Flowsheet Row Pulmonary Rehab from 11/08/2023 in [facility]  Date 11/08/23  Educator [facility]  Instruction Review Code 1- Verbalizes Understanding    Chronic Lung Disease Review: - Group verbal instruction with posters, models, PowerPoint presentations and videos,  to review new updates, new respiratory medications, new advancements in procedures and treatments. Providing information on websites and 800 numbers for continued self-education. Includes information about supplement oxygen, available portable oxygen systems, continuous and intermittent flow rates, oxygen safety, concentrators, and Medicare reimbursement for oxygen. Explanation of Pulmonary Drugs,  including class, frequency, complications, importance of spacers, rinsing mouth after steroid MDI's, and proper cleaning methods for nebulizers. Review of basic lung anatomy and physiology related to function, structure, and complications of lung disease. Review of risk factors. Discussion about methods for diagnosing sleep apnea and types of masks and machines for OSA. Includes a review of the use of types of environmental controls: home humidity, furnaces, filters, dust mite/pet prevention, HEPA vacuums. Discussion about weather changes, air quality and the benefits of nasal washing. Instruction on Warning signs, infection symptoms, calling MD promptly, preventive modes, and value of vaccinations. Review of effective airway clearance, coughing and/or vibration techniques. Emphasizing that all should Create an Action Plan. Written material given at graduation.   AED/CPR: - Group verbal and written instruction with the use of models to demonstrate the basic use of the AED with the basic ABC's of resuscitation.    Anatomy and Cardiac Procedures: - Group verbal and visual presentation and models provide information about basic cardiac anatomy and function. Reviews the testing methods done to diagnose heart disease and the outcomes of the test results. Describes the treatment choices: Medical Management, Angioplasty, or Coronary Bypass Surgery for treating various heart conditions including Myocardial Infarction, Angina, Valve Disease, and Cardiac Arrhythmias.  Written material given at graduation.   Medication Safety: - Group verbal and visual instruction to review commonly prescribed medications for heart and lung disease. Reviews the medication, class of the drug, and side effects. Includes the steps to properly store meds and maintain the prescription regimen.  Written material given at graduation.   Other: -Provides group and verbal instruction on various topics (see comments)   Knowledge  Questionnaire Score:  Knowledge Questionnaire Score - 11/16/23 0936       Knowledge Questionnaire Score   Pre Score 12/18           Core Components/Risk Factors/Patient Goals at Admission:  Personal Goals and Risk Factors at Admission - 11/08/23 1429       Core Components/Risk Factors/Patient Goals on Admission    Weight Management Yes;Weight Gain    Intervention Weight Management: Develop a combined nutrition and exercise program designed to reach desired caloric intake, while maintaining appropriate intake of nutrient and fiber, sodium and fats, and appropriate energy expenditure required for the weight goal.;Weight Management: Provide education and appropriate resources to help participant work on and attain dietary goals.;Obesity: Provide education and appropriate resources to help participant work on and attain dietary goals.    Expected Outcomes Short Term: Continue  to assess and modify interventions until short term weight is achieved;Weight Loss: Understanding of general recommendations for a balanced deficit meal plan, which promotes 1-2 lb weight loss per week and includes a negative energy balance of [phone] kcal/d;Understanding recommendations for meals to include 15-35% energy as protein, 25-35% energy from fat, 35-60% energy from carbohydrates, less than 200mg  of dietary cholesterol, 20-35 gm of total fiber daily;Understanding of distribution of calorie intake throughout the day with the consumption of 4-5 meals/snacks;Weight Gain: Understanding of general recommendations for a high calorie, high protein meal plan that promotes weight gain by distributing calorie intake throughout the day with the consumption for 4-5 meals, snacks, and/or supplements    Improve shortness of breath with ADL's Yes    Intervention Provide education, individualized exercise plan and daily activity instruction to help decrease symptoms of SOB with activities of daily living.    Expected Outcomes Short  Term: Improve cardiorespiratory fitness to achieve a reduction of symptoms when performing ADLs;Long Term: Be able to perform more ADLs without symptoms or delay the onset of symptoms    Hypertension Yes    Intervention Provide education on lifestyle modifcations including regular physical activity/exercise, weight management, moderate sodium restriction and increased consumption of fresh fruit, vegetables, and low fat dairy, alcohol moderation, and smoking cessation.;Monitor prescription use compliance.    Expected Outcomes Short Term: Continued assessment and intervention until BP is < 140/79mm HG in hypertensive participants. < 130/74mm HG in hypertensive participants with diabetes, heart failure or chronic kidney disease.;Long Term: Maintenance of blood pressure at goal levels.    Lipids Yes    Intervention Provide education and support for participant on nutrition & aerobic/resistive exercise along with prescribed medications to achieve LDL 70mg , HDL >40mg .    Expected Outcomes Short Term: Participant states understanding of desired cholesterol values and is compliant with medications prescribed. Participant is following exercise prescription and nutrition guidelines.;Long Term: Cholesterol controlled with medications as prescribed, with individualized exercise RX and with personalized nutrition plan. Value goals: LDL < 70mg , HDL > 40 mg.          Education:Diabetes - Individual verbal and written instruction to review signs/symptoms of diabetes, desired ranges of glucose level fasting, after meals and with exercise. Acknowledge that pre and post exercise glucose checks will be done for 3 sessions at entry of program.   Know Your Numbers and Heart Failure: - Group verbal and visual instruction to discuss disease risk factors for cardiac and pulmonary disease and treatment options.  Reviews associated critical values for Overweight/Obesity, Hypertension, Cholesterol, and Diabetes.  Discusses  basics of heart failure: signs/symptoms and treatments.  Introduces Heart Failure Zone chart for action plan for heart failure.  Written material given at graduation.   Core Components/Risk Factors/Patient Goals Review:   Goals and Risk Factor Review     Row Name 12/14/23 0951             Core Components/Risk Factors/Patient Goals Review   Personal Goals Review Improve shortness of breath with ADL's       Review Spoke to patient about their shortness of breath and what they can do to improve. Patient has been informed of breathing techniques when starting the program. Patient is informed to tell staff if they have had any med changes and that certain meds they are taking or not taking can be causing shortness of breath.       Expected Outcomes Short: Attend [facility] regularly to improve shortness of breath with ADL's. Long: maintain  independence with ADL's          Core Components/Risk Factors/Patient Goals at Discharge (Final Review):   Goals and Risk Factor Review - 12/14/23 0951       Core Components/Risk Factors/Patient Goals Review   Personal Goals Review Improve shortness of breath with ADL's    Review Spoke to patient about their shortness of breath and what they can do to improve. Patient has been informed of breathing techniques when starting the program. Patient is informed to tell staff if they have had any med changes and that certain meds they are taking or not taking can be causing shortness of breath.    Expected Outcomes Short: Attend [facility] regularly to improve shortness of breath with ADL's. Long: maintain independence with ADL's          ITP Comments:  ITP Comments     Row Name 11/08/23 1433 11/09/23 1110 11/16/23 0944 01/03/24 0843     ITP Comments Virtual Visit completed. Patient informed on EP and RD appointment and 6 Minute walk test. Patient also informed of patient health questionnaires on My Chart. Patient Verbalizes understanding. Visit diagnosis  can be found in [facility] 10/04/2023. Completed and gym orientation for respiratory care services. Initial ITP created and sent for review to Dr. Faud Aleskerov, Medical Director. First full day of exercise!  Patient was oriented to gym and equipment including functions, settings, policies, and procedures.  Patient's individual exercise prescription and treatment plan were reviewed.  All starting workloads were established based on the results of the 6 minute walk test done at initial orientation visit.  The plan for exercise progression was also introduced and progression will be customized based on patient's performance and goals. 30 Day review completed. Medical Director ITP review done, changes made as directed, and signed approval by Medical Director. new to program.       Comments: 30 day review

## 2024-01-04 ENCOUNTER — Encounter: Admitting: *Deleted

## 2024-01-04 DIAGNOSIS — J449 Chronic obstructive pulmonary disease, unspecified: Secondary | ICD-10-CM

## 2024-01-04 NOTE — Progress Notes (Signed)
 Daily Session Note  Patient Details  Name: Taylor Chambers MRN: [id number] Date of Birth: 1946/05/31 Referring Provider:   [facility]rad [facility]s [facility] Re[facility] from 11/09/2023 in [facility] Cardiac and Pulmonary Rehab  Referring Provider Dr. Halina Picking, MD    Encounter Date: 01/04/2024  Check In:  Session Check In - 01/04/24 1124       Check-In   Supervising physician immediately available to respond to emergencies See telemetry face sheet for immediately available ER MD    Location [facility]-Cardiac & Pulmonary Rehab    Staff Present Bruno Mirza RN,BSN;Maxon Conetta BS, Exercise Physiologist;Margaret Best, MS, Exercise Physiologist;Joseph Rolinda RCP,RRT,BSRT    Virtual Visit No    Medication changes reported     No    Fall or balance concerns reported    No    Tobacco Cessation No Change    Warm-up and Cool-down Performed on first and last piece of equipment    Resistance Training Performed Yes    VAD Patient? No    PAD/SET Patient? No      Pain Assessment   Currently in Pain? No/denies             Social History   Tobacco Use  Smoking Status Former   Current packs/day: 0.00   Average packs/day: 0.3 packs/day for 50.0 years (12.5 ttl pk-yrs)   Types: Cigarettes   Start date: 01/10/1972   Quit date: 01/09/2022   Years since quitting: 1.9  Smokeless Tobacco Never    Goals Met:  Independence with exercise equipment Exercise tolerated well No report of concerns or symptoms today Strength training completed today  Goals Unmet:  Not Applicable  Comments: Pt able to follow exercise prescription today without complaint.  Will continue to monitor for progression.    Dr. Oneil Pinal is Medical Director for [facility] Cardiac Rehabilitation.  Dr. Fuad Aleskerov is Medical Director for [facility] Pulmonary Rehabilitation.

## 2024-01-09 ENCOUNTER — Encounter

## 2024-01-11 ENCOUNTER — Encounter: Attending: Pulmonary Disease | Admitting: *Deleted

## 2024-01-11 DIAGNOSIS — J449 Chronic obstructive pulmonary disease, unspecified: Secondary | ICD-10-CM | POA: Diagnosis present

## 2024-01-11 NOTE — Progress Notes (Signed)
 Daily Session Note  Patient Details  Name: Taylor Chambers MRN: [id number] Date of Birth: Nov 01, 1945 Referring Provider:   Conrad Ports [facility] Rehab from 11/09/2023 in [facility] Cardiac and Pulmonary Rehab  Referring Provider Dr. Halina Picking, MD    Encounter Date: 01/11/2024  Check In:  Session Check In - 01/11/24 1039       Check-In   Supervising physician immediately available to respond to emergencies See telemetry face sheet for immediately available ER MD    Location [facility]-Cardiac & Pulmonary Rehab    Staff Present Rollene Paterson, MS, Exercise Physiologist;Maxon Burnell HECKLE, Exercise Physiologist;Joseph Hood RCP,RRT,BSRT   Leita Franks RN BSN   Virtual Visit No    Medication changes reported     No    Fall or balance concerns reported    No    Warm-up and Cool-down Performed on first and last piece of equipment    Resistance Training Performed Yes    VAD Patient? No    PAD/SET Patient? No      Pain Assessment   Currently in Pain? No/denies             Social History   Tobacco Use  Smoking Status Former   Current packs/day: 0.00   Average packs/day: 0.3 packs/day for 50.0 years (12.5 ttl pk-yrs)   Types: Cigarettes   Start date: 01/10/1972   Quit date: 01/09/2022   Years since quitting: 2.0  Smokeless Tobacco Never    Goals Met:  Proper associated with RPD/PD & O2 Sat Independence with exercise equipment Exercise tolerated well No report of concerns or symptoms today  Goals Unmet:  Not Applicable  Comments: Pt able to follow exercise prescription today without complaint.  Will continue to monitor for progression.    Dr. Oneil Pinal is Medical Director for [facility] Cardiac Rehabilitation.  Dr. Fuad Aleskerov is Medical Director for [facility] Pulmonary Rehabilitation.

## 2024-01-16 ENCOUNTER — Encounter

## 2024-01-17 ENCOUNTER — Encounter

## 2024-01-17 DIAGNOSIS — J449 Chronic obstructive pulmonary disease, unspecified: Secondary | ICD-10-CM | POA: Diagnosis not present

## 2024-01-17 NOTE — Progress Notes (Signed)
 Daily Session Note  Patient Details  Name: Taylor Taylor MRN: [id number] Date of Birth: 1946/03/15 Referring Provider:   Conrad Chambers Pulmonary Rehab from 11/09/2023 in [facility] Cardiac and Pulmonary Rehab  Referring Provider Dr. Halina Picking, MD    Encounter Date: 01/17/2024  Check In:  Session Check In - 01/17/24 0907       Check-In   Supervising physician immediately available to respond to emergencies See telemetry face sheet for immediately available ER MD    Location [facility]-Cardiac & Pulmonary Rehab    Staff Present Taylor Taylor RCP,RRT,BSRT;Taylor Bollinger RN,BSN,MPA;Taylor Taylor, BS, Exercise Physiologist;Taylor Best, MS, Exercise Physiologist    Virtual Visit No    Medication changes reported     No    Fall or balance concerns reported    No    Tobacco Cessation No Change    Warm-up and Cool-down Performed on first and last piece of equipment    Resistance Training Performed Yes    VAD Patient? No    PAD/SET Patient? No      Pain Assessment   Currently in Pain? No/denies             Social History   Tobacco Use  Smoking Status Former   Current packs/day: 0.00   Average packs/day: 0.3 packs/day for 50.0 years (12.5 ttl pk-yrs)   Types: Cigarettes   Start date: 01/10/1972   Quit date: 01/09/2022   Years since quitting: 2.0  Smokeless Tobacco Never    Goals Met:  Proper associated with RPD/PD & O2 Sat Independence with exercise equipment Using PLB without cueing & demonstrates good technique Exercise tolerated well No report of concerns or symptoms today Strength training completed today  Goals Unmet:  Not Applicable  Comments: Pt able to follow exercise prescription today without complaint.  Will continue to monitor for progression.    Dr. Oneil Chambers is Medical Director for [facility] Cardiac Rehabilitation.  Dr. Fuad Taylor is Medical Director for [facility] Pulmonary Rehabilitation.

## 2024-01-18 ENCOUNTER — Encounter: Admitting: Emergency Medicine

## 2024-01-18 DIAGNOSIS — J449 Chronic obstructive pulmonary disease, unspecified: Secondary | ICD-10-CM | POA: Diagnosis not present

## 2024-01-18 NOTE — Progress Notes (Signed)
 Daily Session Note  Patient Details  Name: Taylor Chambers MRN: [id number] Date of Birth: 1945-11-15 Referring Provider:   Conrad Ports [facility] Rehab from 11/09/2023 in [facility] Cardiac and Pulmonary Rehab  Referring Provider Dr. Halina Picking, MD    Encounter Date: 01/18/2024  Check In:  Session Check In - 01/18/24 0929       Check-In   Supervising physician immediately available to respond to emergencies See telemetry face sheet for immediately available ER MD    Location [facility]-Cardiac & Pulmonary Rehab    Staff Present Othel Durand, RN, BSN, CCRP;Joseph Hood RCP,RRT,BSRT;Maxon [place] BS, Exercise Physiologist;Jason Elnor RDN,LDN    Virtual Visit No    Medication changes reported     No    Fall or balance concerns reported    No    Tobacco Cessation No Change    Warm-up and Cool-down Performed on first and last piece of equipment    Resistance Training Performed Yes    VAD Patient? No    PAD/SET Patient? No      Pain Assessment   Currently in Pain? No/denies             Social History   Tobacco Use  Smoking Status Former   Current packs/day: 0.00   Average packs/day: 0.3 packs/day for 50.0 years (12.5 ttl pk-yrs)   Types: Cigarettes   Start date: 01/10/1972   Quit date: 01/09/2022   Years since quitting: 2.0  Smokeless Tobacco Never    Goals Met:  Independence with exercise equipment Exercise tolerated well No report of concerns or symptoms today Strength training completed today  Goals Unmet:  Not Applicable  Comments: Pt able to follow exercise prescription today without complaint.  Will continue to monitor for progression.    Dr. Oneil Pinal is Medical Director for [facility] Cardiac Rehabilitation.  Dr. Fuad Aleskerov is Medical Director for [facility] Pulmonary Rehabilitation.

## 2024-01-23 ENCOUNTER — Encounter

## 2024-01-24 ENCOUNTER — Encounter

## 2024-01-24 DIAGNOSIS — J449 Chronic obstructive pulmonary disease, unspecified: Secondary | ICD-10-CM

## 2024-01-24 NOTE — Progress Notes (Signed)
 Daily Session Note  Patient Details  Name: Taylor Chambers MRN: [id number] Date of Birth: 1945-07-06 Referring Provider:   Conrad Ports [facility] Rehab from 11/09/2023 in [facility] Cardiac and Pulmonary Rehab  Referring Provider Dr. Halina Picking, MD    Encounter Date: 01/24/2024  Check In:  Session Check In - 01/24/24 0908       Check-In   Supervising physician immediately available to respond to emergencies See telemetry face sheet for immediately available ER MD    Location [facility]-Cardiac & Pulmonary Rehab    Staff Present Burnard Davenport RN,BSN,MPA;Joseph Rolinda RCP,RRT,BSRT;Jason Elnor RDN,LDN;Lauren Cates RN,BSN    Virtual Visit No    Medication changes reported     No    Fall or balance concerns reported    No    Tobacco Cessation No Change    Warm-up and Cool-down Performed on first and last piece of equipment    Resistance Training Performed Yes    VAD Patient? No    PAD/SET Patient? No      Pain Assessment   Currently in Pain? No/denies             Social History   Tobacco Use  Smoking Status Former   Current packs/day: 0.00   Average packs/day: 0.3 packs/day for 50.0 years (12.5 ttl pk-yrs)   Types: Cigarettes   Start date: 01/10/1972   Quit date: 01/09/2022   Years since quitting: 2.0  Smokeless Tobacco Never    Goals Met:  Proper associated with RPD/PD & O2 Sat Independence with exercise equipment Using PLB without cueing & demonstrates good technique Exercise tolerated well No report of concerns or symptoms today Strength training completed today  Goals Unmet:  Not Applicable  Comments: Pt able to follow exercise prescription today without complaint.  Will continue to monitor for progression.    Dr. Oneil Pinal is Medical Director for [facility] Cardiac Rehabilitation.  Dr. Fuad Aleskerov is Medical Director for [facility] Pulmonary Rehabilitation.

## 2024-01-25 ENCOUNTER — Encounter: Admitting: Emergency Medicine

## 2024-01-25 DIAGNOSIS — J449 Chronic obstructive pulmonary disease, unspecified: Secondary | ICD-10-CM | POA: Diagnosis not present

## 2024-01-25 NOTE — Progress Notes (Signed)
 Daily Session Note  Patient Details  Name: Taylor Chambers MRN: [id number] Date of Birth: 11-10-1945 Referring Provider:   Conrad Ports [facility] Rehab from 11/09/2023 in [facility] Cardiac and Pulmonary Rehab  Referring Provider Dr. Halina Picking, MD    Encounter Date: 01/25/2024  Check In:  Session Check In - 01/25/24 0920       Check-In   Supervising physician immediately available to respond to emergencies See telemetry face sheet for immediately available ER MD    Location [facility]-Cardiac & Pulmonary Rehab    Staff Present Fairy Plater RCP,RRT,BSRT;Christopherjohn Schiele RN,BSN;Jason Elnor [facility]    Virtual Visit No    Medication changes reported     No    Fall or balance concerns reported    No    Tobacco Cessation No Change    Warm-up and Cool-down Performed on first and last piece of equipment    Resistance Training Performed Yes    VAD Patient? No    PAD/SET Patient? No      Pain Assessment   Currently in Pain? No/denies             Social History   Tobacco Use  Smoking Status Former   Current packs/day: 0.00   Average packs/day: 0.3 packs/day for 50.0 years (12.5 ttl pk-yrs)   Types: Cigarettes   Start date: 01/10/1972   Quit date: 01/09/2022   Years since quitting: 2.0  Smokeless Tobacco Never    Goals Met:  Proper associated with RPD/PD & O2 Sat Improved SOB with ADL's Using PLB without cueing & demonstrates good technique Exercise tolerated well No report of concerns or symptoms today Strength training completed today  Goals Unmet:  Not Applicable  Comments: Pt able to follow exercise prescription today without complaint.  Will continue to monitor for progression.    Dr. Oneil Pinal is Medical Director for [facility] Cardiac Rehabilitation.  Dr. Fuad Aleskerov is Medical Director for [facility] Pulmonary Rehabilitation.

## 2024-01-30 ENCOUNTER — Encounter

## 2024-01-31 ENCOUNTER — Encounter: Admitting: Emergency Medicine

## 2024-01-31 DIAGNOSIS — J449 Chronic obstructive pulmonary disease, unspecified: Secondary | ICD-10-CM

## 2024-01-31 NOTE — Progress Notes (Signed)
 Daily Session Note  Patient Details  Name: Autie Vasudevan MRN: [id number] Date of Birth: [birth year] Referring Provider:   Conrad Ports [facility] Rehab from 11/09/2023 in [facility] Cardiac and Pulmonary Rehab  Referring Provider Dr. Halina Picking, MD    Encounter Date: 01/31/2024  Check In:  Session Check In - 01/31/24 0918       Check-In   Supervising physician immediately available to respond to emergencies See telemetry face sheet for immediately available ER MD    Location [facility]-Cardiac & Pulmonary Rehab    Staff Present Rollene Paterson, MS, Exercise Physiologist;Maxon Burnell HECKLE, Exercise Physiologist;Dilcia Rybarczyk RN,BSN;Laureen Delores, BS, RRT, CPFT    Virtual Visit No    Medication changes reported     No    Fall or balance concerns reported    No    Tobacco Cessation No Change    Warm-up and Cool-down Performed on first and last piece of equipment    Resistance Training Performed Yes    VAD Patient? No    PAD/SET Patient? No      Pain Assessment   Currently in Pain? No/denies             Social History   Tobacco Use  Smoking Status Former   Current packs/day: 0.00   Average packs/day: 0.3 packs/day for 50.0 years (12.5 ttl pk-yrs)   Types: Cigarettes   Start date: 01/10/1972   Quit date: 01/09/2022   Years since quitting: 2.0  Smokeless Tobacco Never    Goals Met:  Proper associated with RPD/PD & O2 Sat Independence with exercise equipment Using PLB without cueing & demonstrates good technique Exercise tolerated well No report of concerns or symptoms today Strength training completed today  Goals Unmet:  Not Applicable  Comments: Pt able to follow exercise prescription today without complaint.  Will continue to monitor for progression.    Dr. Oneil Pinal is Medical Director for [facility] Cardiac Rehabilitation.  Dr. Fuad Aleskerov is Medical Director for [facility] Pulmonary Rehabilitation.

## 2024-01-31 NOTE — Progress Notes (Signed)
 Pulmonary Individual Treatment Plan  Patient Details  Name: Taylor Chambers MRN: [id number] Date of Birth: [birth year] Referring Provider:   [facility]rad [facility]s Pulmonary Rehab from 11/09/2023 in [facility] Cardiac and Pulmonary Rehab  Referring Provider Dr. Halina Picking, MD    Initial Encounter Date:  Flowsheet Row Pulmonary Rehab from 11/09/2023 in [facility] Cardiac and Pulmonary Rehab  Date 11/09/23    Visit Diagnosis: Chronic obstructive pulmonary disease, unspecified COPD type (HCC)  Patient's Home Medications on Admission:  Current Outpatient Medications:    albuterol  (VENTOLIN  HFA) 108 (90 Base) MCG/ACT inhaler, Inhale 2 puffs into the lungs every 6 (six) hours as needed for wheezing or shortness of breath., Disp: 8 g, Rfl: 2   apixaban  (ELIQUIS ) 5 MG TABS tablet, TAKE 1 TABLET BY MOUTH TWICE A DAY, Disp: 180 tablet, Rfl: 1   budesonide  (PULMICORT ) 0.25 MG/2ML nebulizer solution, SMARTSIG:2 Milliliter(s) Twice Daily (Patient not taking: Reported on 12/19/2023), Disp: , Rfl:    Calcium Carbonate (CALCIUM 500 PO), Take 500 mg by mouth daily at 12 noon. (Patient not taking: Reported on 12/19/2023), Disp: , Rfl:    Cholecalciferol  (VITAMIN D3) 1.25 MG (50000 UT) CAPS, Take 5,000 Units by mouth daily in the afternoon., Disp: , Rfl:    COMBIVENT  RESPIMAT 20-100 MCG/ACT AERS respimat, Inhale into the lungs. (Patient not taking: Reported on 12/19/2023), Disp: , Rfl:    cyclobenzaprine (FLEXERIL) 10 MG tablet, Take 10 mg by mouth 2 (two) times daily as needed for muscle spasms. (Patient not taking: Reported on 12/19/2023), Disp: , Rfl:    divalproex  (DEPAKOTE ) 250 MG DR tablet, Take 250 mg by mouth., Disp: , Rfl:    Dupilumab  300 MG/2ML SOAJ, Inject 300 mg into the skin every 14 (fourteen) days. Saturday (Patient not taking: Reported on 12/19/2023), Disp: , Rfl:    DUPIXENT  300 MG/2ML prefilled syringe, Inject 300 mg into the skin., Disp: , Rfl:    EPINEPHrine 0.3 mg/0.3 mL IJ SOAJ injection, Inject 0.3 mg into  the muscle as needed for anaphylaxis., Disp: , Rfl:    furosemide  (LASIX ) 40 MG tablet, Take 1 tablet (40 mg total) by mouth daily. (Patient not taking: Reported on 12/19/2023), Disp: 30 tablet, Rfl: 0   ipratropium (ATROVENT ) 0.06 % nasal spray, Place 1 spray into both nostrils daily., Disp: , Rfl:    ipratropium-albuterol  (DUONEB) 0.5-2.5 (3) MG/3ML SOLN, Take 3 mLs by nebulization 2 (two) times daily. Mix with budesonide  (Patient not taking: Reported on 12/19/2023), Disp: , Rfl:    ipratropium-albuterol  (DUONEB) 0.5-2.5 (3) MG/3ML SOLN, Inhale 3 mLs into the lungs. (Patient not taking: Reported on 12/19/2023), Disp: , Rfl:    losartan  (COZAAR ) 100 MG tablet, Take 1 tablet (100 mg total) by mouth daily., Disp: 90 tablet, Rfl: 3   metoprolol  succinate (TOPROL -XL) 25 MG 24 hr tablet, Take 1 tablet (25 mg total) by mouth 2 (two) times daily. (Patient not taking: Reported on 12/19/2023), Disp: 60 tablet, Rfl: 0   Multiple Vitamin (MULTI-VITAMIN) tablet, Take 1 tablet by mouth daily., Disp: , Rfl:    Multiple Vitamins-Minerals (PRESERVISION AREDS 2+MULTI VIT PO), Take 2 tablets by mouth daily. (Patient not taking: Reported on 12/19/2023), Disp: , Rfl:    OHTUVAYRE  3 MG/2.5ML SUSP, Take 3 mg by nebulization daily. (Patient not taking: Reported on 12/19/2023), Disp: , Rfl:    omeprazole (PRILOSEC) 40 MG capsule, Take 40 mg by mouth daily as needed (Heartburn). (Patient not taking: Reported on 12/19/2023), Disp: , Rfl:    oxybutynin (DITROPAN) 5 MG tablet,  Take 5 mg by mouth. (Patient not taking: Reported on 12/19/2023), Disp: , Rfl:    Potassium Chloride  40 MEQ/15ML (20%) SOLN, Take 20 mEq by mouth. (Patient not taking: Reported on 12/19/2023), Disp: , Rfl:    potassium chloride  SA (KLOR-CON  M) 20 MEQ tablet, Take 1 tablet (20 mEq total) by mouth daily. (Patient not taking: Reported on 12/19/2023), Disp: 90 tablet, Rfl: 3   pravastatin  (PRAVACHOL ) 10 MG tablet, Take 10 mg by mouth daily., Disp: , Rfl:    sertraline   (ZOLOFT ) 100 MG tablet, Take 1 tablet (100 mg total) by mouth daily. (Patient not taking: Reported on 12/19/2023), Disp: 30 tablet, Rfl: 11   sulfamethoxazole -trimethoprim  (BACTRIM ) 400-80 MG tablet, Take 1 tablet by mouth 3 (three) times a week., Disp: , Rfl:   Past Medical History: Past Medical History:  Diagnosis Date   Allergies    Anxiety    Aortic atherosclerosis (HCC)    Bipolar affective disorder (HCC)    COPD (chronic obstructive pulmonary disease) (HCC)    Coronary artery calcification seen on CT scan    Depression    Diastolic dysfunction 02/24/2020   a.) TTE 02/24/2020: EF >55%; triv PR, mild TR, mod MR; G1DD.   DOE (dyspnea on exertion)    Emphysema lung (HCC)    History of cataract    HLD (hyperlipidemia)    Hypertension    Macular degeneration    Migraines    Osteoporosis    Pneumonia    Tobacco use     Tobacco Use: Social History   Tobacco Use  Smoking Status Former   Current packs/day: 0.00   Average packs/day: 0.3 packs/day for 50.0 years (12.5 ttl pk-yrs)   Types: Cigarettes   Start date: 01/10/1972   Quit date: 01/09/2022   Years since quitting: 2.0  Smokeless Tobacco Never    Labs: Review Flowsheet  More data exists      Latest Ref Rng & Units 06/27/2023 06/29/2023 07/02/2023 07/17/2023 08/21/2023  Labs for ITP Cardiac and Pulmonary Rehab  PH, Arterial 7.35 - 7.45 7.44  - 7.6  7.4  -  PCO2 arterial 32 - 48 mmHg 46  - 47  33  -  Bicarbonate 20.0 - 28.0 mmol/L 31.2  26.8  26.6  46.1  20.4  25.0   Acid-base deficit 0.0 - 2.0 mmol/L - 0.1  4.5  - 3.6  3.1   O2 Saturation % 99.9  95  55.1  97.1  89.2  90.4     Details       Multiple values from one day are sorted in reverse-chronological order          Pulmonary Assessment Scores:  Pulmonary Assessment Scores     Row Name 11/09/23 1110 11/16/23 0936       ADL UCSD   ADL Phase -- Entry    SOB Score total -- 53    Rest -- 1    Walk -- 0    Stairs -- 5    Bath -- 0    Dress -- 0    Shop  -- 2      CAT Score   CAT Score -- 19      mMRC Score   mMRC Score 2 2       UCSD: Self-administered rating of dyspnea associated with activities of daily living (ADLs) 6-point scale (0 = not at all to 5 = maximal or unable to do because of breathlessness)  Scoring Scores range from 0 to  120.  Minimally important difference is 5 units  CAT: CAT can identify the health impairment of COPD patients and is better correlated with disease progression.  CAT has a scoring range of zero to 40. The CAT score is classified into four groups of low (less than 10), medium (10 - 20), high (21-30) and very high (31-40) based on the impact level of disease on health status. A CAT score over 10 suggests significant symptoms.  A worsening CAT score could be explained by an exacerbation, poor medication adherence, poor inhaler technique, or progression of COPD or comorbid conditions.  CAT MCID is 2 points  mMRC: mMRC (Modified Medical Research Council) Dyspnea Scale is used to assess the degree of baseline functional disability in patients of respiratory disease due to dyspnea. No minimal important difference is established. A decrease in score of 1 point or greater is considered a positive change.   Pulmonary Function Assessment:  Pulmonary Function Assessment - 11/08/23 1429       Breath   Shortness of Breath Yes;Fear of Shortness of Breath;Limiting activity;Panic with Shortness of Breath          Exercise Target Goals: Exercise Program Goal: Individual exercise prescription set using results from initial 6 min walk test and THRR while considering  patient's activity barriers and safety.   Exercise Prescription Goal: Initial exercise prescription builds to 30-45 minutes a day of aerobic activity, 2-3 days per week.  Home exercise guidelines will be given to patient during program as part of exercise prescription that the participant will acknowledge.  Education: Aerobic Exercise: - Group  verbal and visual presentation on the components of exercise prescription. Introduces F.I.T.T principle from ACSM for exercise prescriptions.  Reviews F.I.T.T. principles of aerobic exercise including progression. Written material provided at class time.   Education: Resistance Exercise: - Group verbal and visual presentation on the components of exercise prescription. Introduces F.I.T.T principle from ACSM for exercise prescriptions  Reviews F.I.T.T. principles of resistance exercise including progression. Written material provided at class time.    Education: Exercise & Equipment Safety: - Individual verbal instruction and demonstration of equipment use and safety with use of the equipment. Flowsheet Row Pulmonary Rehab from 11/08/2023 in [facility] Cardiac and Pulmonary Rehab  Date 11/08/23  Educator [facility]  Instruction Review Code 1- Verbalizes Understanding    Education: Exercise Physiology & General Exercise Guidelines: - Group verbal and written instruction with models to review the exercise physiology of the cardiovascular system and associated critical values. Provides general exercise guidelines with specific guidelines to those with heart or lung disease.    Education: Flexibility, Balance, Mind/Body Relaxation: - Group verbal and visual presentation with interactive activity on the components of exercise prescription. Introduces F.I.T.T principle from ACSM for exercise prescriptions. Reviews F.I.T.T. principles of flexibility and balance exercise training including progression. Also discusses the mind body connection.  Reviews various relaxation techniques to help reduce and manage stress (i.e. Deep breathing, progressive muscle relaxation, and visualization). Balance handout provided to take home. Written material provided at class time.   Activity Barriers & Risk Stratification:  Activity Barriers & Cardiac Risk Stratification - 11/09/23 1123       Activity Barriers & Cardiac Risk  Stratification   Activity Barriers Right Hip Replacement;Other (comment);Assistive Device;Shortness of Breath;History of Falls;Balance Concerns    Comments osteoporosis          6 Minute Walk:  6 Minute Walk     Row Name 11/09/23 1117  6 Minute Walk   Phase Initial     Distance 455 feet     Walk Time 6 minutes     # of Rest Breaks 0     MPH 0.86     METS 1.68     RPE 15     Perceived Dyspnea  4     VO2 Peak 5.88     Symptoms Yes (comment)     Comments used walker     Resting HR 85 bpm     Resting BP 132/68     Resting Oxygen Saturation  95 %     Exercise Oxygen Saturation  during 6 min walk 90 %     Max Ex. HR 105 bpm     Max Ex. BP 154/74     2 Minute Post BP 142/72       Interval HR   1 Minute HR 102     2 Minute HR 105     3 Minute HR 104     4 Minute HR 105     5 Minute HR 101     6 Minute HR 98     2 Minute Post HR 93     Interval Heart Rate? Yes       Interval Oxygen   Interval Oxygen? Yes     Baseline Oxygen Saturation % 95 %     1 Minute Oxygen Saturation % 91 %     1 Minute Liters of Oxygen 3 L  pulsed     2 Minute Oxygen Saturation % 92 %     2 Minute Liters of Oxygen 3 L     3 Minute Oxygen Saturation % 91 %     3 Minute Liters of Oxygen 3 L     4 Minute Oxygen Saturation % 90 %     4 Minute Liters of Oxygen 3 L     5 Minute Oxygen Saturation % 91 %     5 Minute Liters of Oxygen 3 L     6 Minute Oxygen Saturation % 91 %     6 Minute Liters of Oxygen 3 L     2 Minute Post Oxygen Saturation % 95 %     2 Minute Post Liters of Oxygen 3 L       Oxygen Initial Assessment:  Oxygen Initial Assessment - 11/08/23 1428       Home Oxygen   Home Oxygen Device None    Sleep Oxygen Prescription Continuous;BiPAP    Liters per minute 3    Home Exercise Oxygen Prescription None    Home Resting Oxygen Prescription None    Compliance with Home Oxygen Use Yes      Initial 6 min Walk   Oxygen Used None      Program Oxygen Prescription    Program Oxygen Prescription None      Intervention   Short Term Goals To learn and demonstrate proper pursed lip breathing techniques or other breathing techniques. ;To learn and understand importance of monitoring SPO2 with pulse oximeter and demonstrate accurate use of the pulse oximeter.;To learn and exhibit compliance with exercise, home and travel O2 prescription;To learn and understand importance of maintaining oxygen saturations>88%    Long  Term Goals Verbalizes importance of monitoring SPO2 with pulse oximeter and return demonstration;Exhibits proper breathing techniques, such as pursed lip breathing or other method taught during program session;Demonstrates proper use of MDI's;Exhibits compliance with exercise, home  and travel O2  prescription;Maintenance of O2 saturations>88%;Compliance with respiratory medication          Oxygen Re-Evaluation:  Oxygen Re-Evaluation     Row Name 11/16/23 0946 12/14/23 0948 01/17/24 0930         Program Oxygen Prescription   Program Oxygen Prescription None Continuous;E-Tanks Continuous;E-Tanks     Liters per minute -- 3 3       Home Oxygen   Home Oxygen Device None E-Tanks;Home Concentrator;Portable Concentrator E-Tanks;Home Concentrator;Portable Concentrator     Sleep Oxygen Prescription Continuous;BiPAP BiPAP BiPAP     Liters per minute 3 3 3      Home Exercise Oxygen Prescription None Continuous Continuous     Liters per minute -- 3 3     Home Resting Oxygen Prescription None None None     Compliance with Home Oxygen Use Yes Yes Yes       Goals/Expected Outcomes   Short Term Goals To learn and demonstrate proper pursed lip breathing techniques or other breathing techniques.  To learn and demonstrate proper pursed lip breathing techniques or other breathing techniques.  To learn and understand importance of maintaining oxygen saturations>88%;To learn and understand importance of monitoring SPO2 with pulse oximeter and demonstrate accurate  use of the pulse oximeter.     Long  Term Goals Exhibits proper breathing techniques, such as pursed lip breathing or other method taught during program session Exhibits proper breathing techniques, such as pursed lip breathing or other method taught during program session Maintenance of O2 saturations>88%;Verbalizes importance of monitoring SPO2 with pulse oximeter and return demonstration     Comments Reviewed PLB technique with pt.  Talked about how it works and it's importance in maintaining their exercise saturations. Informed patient how to perform the Pursed Lipped breathing technique. Told patient to Inhale through the nose and out the mouth with pursed lips to keep their airways open, help oxygenate them better, practice when at rest or doing strenuous activity. Patient Verbalizes understanding of technique and will work on and be reiterated during [facility]. Brielle has a pulse oximeter to check her oxygen saturation at home. Informed and explained why it is important to have one. Reviewed that oxygen saturations should be 88 percent and above.     Goals/Expected Outcomes Short: Become more profiecient at using PLB. Long: Become independent at using PLB. Short: use PLB with exertion. Long: use PLB on exertion proficiently and independently. Short: monitor oxygen at home with exertion. Long: maintain oxygen saturations above 88 percent independently.        Oxygen Discharge (Final Oxygen Re-Evaluation):  Oxygen Re-Evaluation - 01/17/24 0930       Program Oxygen Prescription   Program Oxygen Prescription Continuous;E-Tanks    Liters per minute 3      Home Oxygen   Home Oxygen Device E-Tanks;Home Concentrator;Portable Concentrator    Sleep Oxygen Prescription BiPAP    Liters per minute 3    Home Exercise Oxygen Prescription Continuous    Liters per minute 3    Home Resting Oxygen Prescription None    Compliance with Home Oxygen Use Yes      Goals/Expected Outcomes   Short Term Goals To  learn and understand importance of maintaining oxygen saturations>88%;To learn and understand importance of monitoring SPO2 with pulse oximeter and demonstrate accurate use of the pulse oximeter.    Long  Term Goals Maintenance of O2 saturations>88%;Verbalizes importance of monitoring SPO2 with pulse oximeter and return demonstration    Comments Allene has a pulse oximeter to check  her oxygen saturation at home. Informed and explained why it is important to have one. Reviewed that oxygen saturations should be 88 percent and above.    Goals/Expected Outcomes Short: monitor oxygen at home with exertion. Long: maintain oxygen saturations above 88 percent independently.          Initial Exercise Prescription:  Initial Exercise Prescription - 11/09/23 1100       Date of Initial Exercise RX and Referring Provider   Date 11/09/23    Referring Provider Dr. Fuad Aleskerov, MD      Oxygen   Oxygen Continuous    Liters 3    Maintain Oxygen Saturation 88% or higher      Recumbant Bike   Level 1    RPM 50    Watts 15    Minutes 15    METs 1.68      NuStep   Level 1    SPM 80    Minutes 15    METs 1.68      Track   Laps 12    Minutes 15    METs 1.65      Prescription Details   Frequency (times per week) 2    Duration Progress to 30 minutes of continuous aerobic without signs/symptoms of physical distress      Intensity   THRR 40-80% of Max Heartrate 108-132    Ratings of Perceived Exertion 11-13    Perceived Dyspnea 0-4      Progression   Progression Continue to progress workloads to maintain intensity without signs/symptoms of physical distress.      Resistance Training   Training Prescription Yes    Weight 2 lb    Reps 10-15          Perform Capillary Blood Glucose checks as needed.  Exercise Prescription Changes:   Exercise Prescription Changes     Row Name 11/09/23 1100 11/21/23 1400 12/06/23 1300 12/12/23 1000 12/20/23 1600     Response to Exercise   Blood  Pressure (Admit) 132/68 132/70 132/60 -- 118/64   Blood Pressure (Exercise) 154/74 148/68 156/64 -- 160/70   Blood Pressure (Exit) 142/72 126/68 120/66 -- 128/60   Heart Rate (Admit) 85 bpm 74 bpm 81 bpm -- 95 bpm   Heart Rate (Exercise) 105 bpm 98 bpm 110 bpm -- 103 bpm   Heart Rate (Exit) 93 bpm 90 bpm 72 bpm -- 87 bpm   Oxygen Saturation (Admit) 95 % 98 % 96 % -- 90 %   Oxygen Saturation (Exercise) 90 % 92 % 89 % -- 98 %   Oxygen Saturation (Exit) 95 % 93 % 91 % -- 94 %   Rating of Perceived Exertion (Exercise) 15 15 14  -- 15   Perceived Dyspnea (Exercise) 4 3 2  -- 3   Symptoms used walker none none -- none   Comments Results 1st day of exercise -- -- --   Duration -- Continue with 30 min of aerobic exercise without signs/symptoms of physical distress. Continue with 30 min of aerobic exercise without signs/symptoms of physical distress. -- Continue with 30 min of aerobic exercise without signs/symptoms of physical distress.   Intensity -- THRR unchanged THRR unchanged -- THRR unchanged     Progression   Progression -- Continue to progress workloads to maintain intensity without signs/symptoms of physical distress. Continue to progress workloads to maintain intensity without signs/symptoms of physical distress. -- Continue to progress workloads to maintain intensity without signs/symptoms of physical distress.   Average  METs -- 2.4 1.81 -- 1.48     Resistance Training   Training Prescription -- Yes Yes -- Yes   Weight -- 2 lb 2 lb -- 2 lb   Reps -- 10-15 10-15 -- 10-15     Interval Training   Interval Training -- No No -- No     Oxygen   Oxygen -- Continuous Continuous -- Continuous   Liters -- 2 2 -- 2-3     Recumbant Bike   Level -- 1 -- -- --   Watts -- 15 -- -- --   Minutes -- 15 -- -- --   METs -- 2.99 -- -- --     NuStep   Level -- 1 2 -- 1   Minutes -- 15 15 -- 15   METs -- 1.8 1.6 -- 1.5     Arm Ergometer   Level -- -- -- -- 1   Minutes -- -- -- -- 15    METs -- -- -- -- 1     Track   Laps -- -- 32 -- 24   Minutes -- -- 15 -- 15   METs -- -- 2.74 -- 2.31     Home Exercise Plan   Plans to continue exercise at -- -- -- Home (comment)  walking, resistance w/ weights, keeping up with 73 year old grandbaby Home (comment)  walking, resistance w/ weights, keeping up with 1 year old grandbaby   Frequency -- -- -- Add 3 additional days to program exercise sessions. Add 3 additional days to program exercise sessions.   Initial Home Exercises Provided -- -- -- 12/12/23 12/12/23     Oxygen   Maintain Oxygen Saturation -- 88% or higher 88% or higher -- 88% or higher    Row Name 01/02/24 1400 01/16/24 1500           Response to Exercise   Blood Pressure (Admit) 124/70 136/64      Blood Pressure (Exercise) -- 164/62      Blood Pressure (Exit) 130/60 134/58      Heart Rate (Admit) 97 bpm 87 bpm      Heart Rate (Exercise) 111 bpm 105 bpm      Heart Rate (Exit) 95 bpm 84 bpm      Oxygen Saturation (Admit) 93 % 95 %      Oxygen Saturation (Exercise) 88 % 88 %      Oxygen Saturation (Exit) 93 % 90 %      Rating of Perceived Exertion (Exercise) 17 14      Perceived Dyspnea (Exercise) 4 2      Symptoms none none      Duration Continue with 30 min of aerobic exercise without signs/symptoms of physical distress. Continue with 30 min of aerobic exercise without signs/symptoms of physical distress.      Intensity THRR unchanged THRR unchanged        Progression   Progression Continue to progress workloads to maintain intensity without signs/symptoms of physical distress. Continue to progress workloads to maintain intensity without signs/symptoms of physical distress.      Average METs 2.04 2.21        Resistance Training   Training Prescription Yes Yes      Weight 2 lb 2 lb      Reps 10-15 10-15        Interval Training   Interval Training No No        Oxygen   Oxygen Continuous Continuous  Liters 3 3        NuStep   Level 1 1       Minutes 15 15      METs 1.6 1.6        Track   Laps 52 52      Minutes 15 15      METs 3.8 3.83        Home Exercise Plan   Plans to continue exercise at Home (comment)  walking, resistance w/ weights, keeping up with 70 year old grandbaby Home (comment)  walking, resistance w/ weights, keeping up with 72 year old grandbaby      Frequency Add 3 additional days to program exercise sessions. Add 3 additional days to program exercise sessions.      Initial Home Exercises Provided 12/12/23 12/12/23        Oxygen   Maintain Oxygen Saturation 88% or higher 88% or higher         Exercise Comments:   Exercise Comments     Row Name 11/16/23 0945           Exercise Comments First full day of exercise!  Patient was oriented to gym and equipment including functions, settings, policies, and procedures.  Patient's individual exercise prescription and treatment plan were reviewed.  All starting workloads were established based on the results of the 6 minute walk test done at initial orientation visit.  The plan for exercise progression was also introduced and progression will be customized based on patient's performance and goals.          Exercise Goals and Review:   Exercise Goals     Row Name 11/09/23 1124             Exercise Goals   Increase Physical Activity Yes       Intervention Provide advice, education, support and counseling about physical activity/exercise needs.;Develop an individualized exercise prescription for aerobic and resistive training based on initial evaluation findings, risk stratification, comorbidities and participant's personal goals.       Expected Outcomes Short Term: Attend rehab on a regular basis to increase amount of physical activity.;Long Term: Exercising regularly at least 3-5 days a week.;Long Term: Add in home exercise to make exercise part of routine and to increase amount of physical activity.       Increase Strength and Stamina Yes        Intervention Develop an individualized exercise prescription for aerobic and resistive training based on initial evaluation findings, risk stratification, comorbidities and participant's personal goals.;Provide advice, education, support and counseling about physical activity/exercise needs.       Expected Outcomes Short Term: Increase workloads from initial exercise prescription for resistance, speed, and METs.;Short Term: Perform resistance training exercises routinely during rehab and add in resistance training at home;Long Term: Improve cardiorespiratory fitness, muscular endurance and strength as measured by increased METs and functional capacity ( )       Able to understand and use rate of perceived exertion (RPE) scale Yes       Intervention Provide education and explanation on how to use RPE scale       Expected Outcomes Short Term: Able to use RPE daily in rehab to express subjective intensity level;Long Term:  Able to use RPE to guide intensity level when exercising independently       Able to understand and use Dyspnea scale Yes       Intervention Provide education and explanation on how to use Dyspnea scale  Expected Outcomes Short Term: Able to use Dyspnea scale daily in rehab to express subjective sense of shortness of breath during exertion;Long Term: Able to use Dyspnea scale to guide intensity level when exercising independently       Knowledge and understanding of Target Heart Rate Range (THRR) Yes       Intervention Provide education and explanation of THRR including how the numbers were predicted and where they are located for reference       Expected Outcomes Short Term: Able to state/look up THRR;Long Term: Able to use THRR to govern intensity when exercising independently;Short Term: Able to use daily as guideline for intensity in rehab       Able to check pulse independently Yes       Intervention Provide education and demonstration on how to check pulse in carotid and  radial arteries.;Review the importance of being able to check your own pulse for safety during independent exercise       Expected Outcomes Short Term: Able to explain why pulse checking is important during independent exercise;Long Term: Able to check pulse independently and accurately       Understanding of Exercise Prescription Yes       Intervention Provide education, explanation, and written materials on patient's individual exercise prescription       Expected Outcomes Short Term: Able to explain program exercise prescription;Long Term: Able to explain home exercise prescription to exercise independently          Exercise Goals Re-Evaluation :  Exercise Goals Re-Evaluation     Row Name 11/16/23 0945 11/21/23 1450 12/06/23 1350 12/12/23 1033 12/20/23 1621     Exercise Goal Re-Evaluation   Exercise Goals Review Able to understand and use rate of perceived exertion (RPE) scale;Knowledge and understanding of Target Heart Rate Range (THRR);Able to understand and use Dyspnea scale;Understanding of Exercise Prescription Increase Physical Activity;Understanding of Exercise Prescription;Increase Strength and Stamina Increase Physical Activity;Understanding of Exercise Prescription;Increase Strength and Stamina Increase Physical Activity;Able to understand and use rate of perceived exertion (RPE) scale;Knowledge and understanding of Target Heart Rate Range (THRR);Understanding of Exercise Prescription;Increase Strength and Stamina;Able to understand and use Dyspnea scale;Able to check pulse independently Increase Physical Activity;Understanding of Exercise Prescription;Increase Strength and Stamina   Comments eviewed RPE and dyspnea scale, THR and program prescription with pt today.  Pt voiced understanding and was given a copy of goals to take home. Wladyslawa is off to a good start in the program and completed her first day in this review. She tolerated her exercise prescription well with level 1 on the T4  nustep and level 1 on the recumbent bike. She used 2 lb weights for resistance. We will continue to monitor her progress in the program. Aleja is doing well in rehab. She was recently able to increase from level 1 to 2 on the T4 nustep. She was also able to walk 32 laps on the track. We will continue to monitor her progress in the program. Reviewed home exercise with pt today from 9:25 to 9:35am.  Pt plans to walk for aerobic and use weights for resistance at home and keep up with her 32 year old great grandbaby for exercise. She plans to add 3 additional days at home. Reviewed THR, pulse, RPE, sign and symptoms, pulse oximetery and when to call 911 or MD.  Also discussed weather considerations and indoor options.  Pt voiced understanding. Jaxon is doing well in rehab. She was able to use the arm ergometer at level  1. She worked at level 1 on the T4 nustep and walked 24 laps on the track. We will continue to monitor her progress in the program.   Expected Outcomes Short: Use RPE daily to regulate intensity. Long: Follow program prescription in THR. Short: Continue to follow current exercise prescription. Long: Continue exercise to improve strength and stamina. Short: Continue to follow current exercise prescription. Long: Continue exercise to improve strength and stamina. Short: Add 3 additional days of exercise at home. Long: Continue to exercise at home independently. Short: Try level 2 on the T4 nustep and push for more laps on the track. Long: Continue exercise to improve strength and stamina.    Row Name 01/02/24 1500 01/16/24 1524           Exercise Goal Re-Evaluation   Exercise Goals Review Increase Physical Activity;Understanding of Exercise Prescription;Increase Strength and Stamina Increase Physical Activity;Understanding of Exercise Prescription;Increase Strength and Stamina      Comments Kaitelyn is doing well in rehab. She increased her laps on the track to 30 and 52 laps. She maintained level 1  on the T4 nustep. We will continue to monitor her progress in the program. Naama continues to do well in rehab. She has continued to walk 52 laps on the track. She also has maintained her workload at level 1 on the T4 nustep. We will continue to monitor her progress in the program.      Expected Outcomes Short: Try level 2 on the T4 nustep. Long: Continue exercise to improve strength and stamina. Short: Increase to level 2 on the T4 nustep. Long: Continue exercise to improve strength and stamina.         Discharge Exercise Prescription (Final Exercise Prescription Changes):  Exercise Prescription Changes - 01/16/24 1500       Response to Exercise   Blood Pressure (Admit) 136/64    Blood Pressure (Exercise) 164/62    Blood Pressure (Exit) 134/58    Heart Rate (Admit) 87 bpm    Heart Rate (Exercise) 105 bpm    Heart Rate (Exit) 84 bpm    Oxygen Saturation (Admit) 95 %    Oxygen Saturation (Exercise) 88 %    Oxygen Saturation (Exit) 90 %    Rating of Perceived Exertion (Exercise) 14    Perceived Dyspnea (Exercise) 2    Symptoms none    Duration Continue with 30 min of aerobic exercise without signs/symptoms of physical distress.    Intensity THRR unchanged      Progression   Progression Continue to progress workloads to maintain intensity without signs/symptoms of physical distress.    Average METs 2.21      Resistance Training   Training Prescription Yes    Weight 2 lb    Reps 10-15      Interval Training   Interval Training No      Oxygen   Oxygen Continuous    Liters 3      NuStep   Level 1    Minutes 15    METs 1.6      Track   Laps 52    Minutes 15    METs 3.83      Home Exercise Plan   Plans to continue exercise at Home (comment)   walking, resistance w/ weights, keeping up with 15 year old grandbaby   Frequency Add 3 additional days to program exercise sessions.    Initial Home Exercises Provided 12/12/23      Oxygen   Maintain Oxygen  Saturation 88% or  higher          Nutrition:  Target Goals: Understanding of nutrition guidelines, daily intake of sodium 1500mg , cholesterol 200mg , calories 30% from fat and 7% or less from saturated fats, daily to have 5 or more servings of fruits and vegetables.  Education: Nutrition 1 -Group instruction provided by verbal, written material, interactive activities, discussions, models, and posters to present general guidelines for heart healthy nutrition including macronutrients, label reading, and promoting whole foods over processed counterparts. Education serves as Pensions consultant of discussion of heart healthy eating for all. Written material provided at class time.     Education: Nutrition 2 -Group instruction provided by verbal, written material, interactive activities, discussions, models, and posters to present general guidelines for heart healthy nutrition including sodium, cholesterol, and saturated fat. Providing guidance of habit forming to improve blood pressure, cholesterol, and body weight. Written material provided at class time.     Biometrics:  Pre Biometrics - 11/09/23 1124       Pre Biometrics   Height 5' 2 (1.575 m)    Weight 100 lb 11.2 oz (45.7 kg)    Waist Circumference 26.5 inches    Hip Circumference 33.5 inches    Waist to Hip Ratio 0.79 %    BMI (Calculated) 18.41    Single Leg Stand 1.6 seconds           Nutrition Therapy Plan and Nutrition Goals:  Nutrition Therapy & Goals - 11/09/23 1133       Nutrition Therapy   RD appointment deferred Yes      Intervention Plan   Intervention Prescribe, educate and counsel regarding individualized specific dietary modifications aiming towards targeted core components such as weight, hypertension, lipid management, diabetes, heart failure and other comorbidities.    Expected Outcomes Short Term Goal: Understand basic principles of dietary content, such as calories, fat, sodium, cholesterol and nutrients.;Short Term Goal:  A plan has been developed with personal nutrition goals set during dietitian appointment.;Long Term Goal: Adherence to prescribed nutrition plan.          Nutrition Assessments:  MEDIFICTS Score Key: >=70 Need to make dietary changes  40-70 Heart Healthy Diet <= 40 Therapeutic Level Cholesterol Diet  Flowsheet Row Pulmonary Rehab from 11/16/2023 in [facility] Cardiac [facility] Pulmonary Rehab  Picture Your Plate Total Score on Admission 53   Picture Your Plate Scores: <59 Unhealthy dietary pattern with much room for improvement. 41-50 Dietary pattern unlikely to meet recommendations for good health and room for improvement. 51-60 More healthful dietary pattern, with some room for improvement.  >60 Healthy dietary pattern, although there may be some specific behaviors that could be improved.   Nutrition Goals Re-Evaluation:  Nutrition Goals Re-Evaluation     Row Name 12/14/23 [phone] 01/17/24 0928           Goals   Comment Patient was informed on why it is important to maintain a balanced diet when dealing with Respiratory issues. Explained that it takes a lot of energy to breath and when they are short of breath often they will need to have a good diet to help keep up with the calories they are expending for breathing. Patient deferred RD appointment.      Expected Outcome Short: Choose and plan snacks accordingly to patients caloric intake to improve breathing. Long: Maintain a diet independently that meets their caloric intake to aid in daily shortness of breath. --         Nutrition Goals  Discharge (Final Nutrition Goals Re-Evaluation):  Nutrition Goals Re-Evaluation - 01/17/24 0928       Goals   Comment Patient deferred RD appointment.          Psychosocial: Target Goals: Acknowledge presence or absence of significant depression and/or stress, maximize coping skills, provide positive support system. Participant is able to verbalize types and ability to use techniques and skills  needed for reducing stress and depression.   Education: Stress, Anxiety, and Depression - Group verbal and visual presentation to define topics covered.  Reviews how body is impacted by stress, anxiety, and depression.  Also discusses healthy ways to reduce stress and to treat/manage anxiety and depression.  Written material provided at class time.   Education: Sleep Hygiene -Provides group verbal and written instruction about how sleep can affect your health.  Define sleep hygiene, discuss sleep cycles and impact of sleep habits. Review good sleep hygiene tips.    Initial Review & Psychosocial Screening:  Initial Psych Review & Screening - 11/08/23 1430       Initial Review   Current issues with Current Psychotropic Meds      Family Dynamics   Good Support System? Yes    Comments Kellie sometimes gets depressed because she cannot breatj at times. She is unable to things that she wants to due to her COPD and she takes Zoloft  to help. She can look to her husband and grandchildren for support.      Barriers   Psychosocial barriers to participate in program The patient should benefit from training in stress management and relaxation.;Psychosocial barriers identified (see note)      Screening Interventions   Interventions Encouraged to exercise;Provide feedback about the scores to participant;To provide support and resources with identified psychosocial needs    Expected Outcomes Short Term goal: Utilizing psychosocial counselor, staff and physician to assist with identification of specific Stressors or current issues interfering with healing process. Setting desired goal for each stressor or current issue identified.;Long Term Goal: Stressors or current issues are controlled or eliminated.;Short Term goal: Identification and review with participant of any Quality of Life or Depression concerns found by scoring the questionnaire.;Long Term goal: The participant improves quality of Life and PHQ9  Scores as seen by post scores and/or verbalization of changes          Quality of Life Scores:  Scores of 19 and below usually indicate a poorer quality of life in these areas.  A difference of  2-3 points is a clinically meaningful difference.  A difference of 2-3 points in the total score of the Quality of Life Index has been associated with significant improvement in overall quality of life, self-image, physical symptoms, and general health in studies assessing change in quality of life.  PHQ-9: Review Flowsheet       01/17/2024 11/09/2023  Depression screen PHQ 2/9  Decreased Interest 0 0  Down, Depressed, Hopeless 0 1  PHQ - 2 Score 0 1  Altered sleeping 0 3  Tired, decreased energy 3 3  Change in appetite 0 2  Feeling bad or failure about yourself  0 3  Trouble concentrating 1 3  Moving slowly or fidgety/restless 0 3  Suicidal thoughts 0 2  PHQ-9 Score 4 20  Difficult doing work/chores Not difficult at all Extremely dIfficult   Interpretation of Total Score  Total Score Depression Severity:  1-4 = Minimal depression, 5-9 = Mild depression, 10-14 = Moderate depression, 15-19 = Moderately severe depression, 20-27 = Severe  depression   Psychosocial Evaluation and Intervention:  Psychosocial Evaluation - 11/08/23 1433       Psychosocial Evaluation & Interventions   Interventions Encouraged to exercise with the program and follow exercise prescription;Relaxation education;Stress management education    Comments Dawnetta sometimes gets depressed because she cannot breatj at times. She is unable to things that she wants to due to her COPD and she takes Zoloft  to help. She can look to her husband and grandchildren for support.    Expected Outcomes Short: Start [facility] to help with mood. Long: Maintain a healthy mental state    Continue Psychosocial Services  Follow up required by staff          Psychosocial Re-Evaluation:  Psychosocial Re-Evaluation     Row Name 01/17/24  [phone]             Psychosocial Re-Evaluation   Current issues with Current Psychotropic Meds       Comments Reviewed patient health questionnaire (PHQ-9) with patient for follow up. Previously, patients score indicated signs/symptoms of depression.  Reviewed to see if patient is improving symptom wise while in program.  Score improved and patient states that it is because she is feeling better and able to exercise.       Expected Outcomes Short: Continue to attend [facility] regularly for regular exercise and social engagement. Long: Continue to improve symptoms and manage a positive mental state.       Interventions Encouraged to attend Pulmonary Rehabilitation for the exercise       Continue Psychosocial Services  Follow up required by staff          Psychosocial Discharge (Final Psychosocial Re-Evaluation):  Psychosocial Re-Evaluation - 01/17/24 0938       Psychosocial Re-Evaluation   Current issues with Current Psychotropic Meds    Comments Reviewed patient health questionnaire (PHQ-9) with patient for follow up. Previously, patients score indicated signs/symptoms of depression.  Reviewed to see if patient is improving symptom wise while in program.  Score improved and patient states that it is because she is feeling better and able to exercise.    Expected Outcomes Short: Continue to attend [facility] regularly for regular exercise and social engagement. Long: Continue to improve symptoms and manage a positive mental state.    Interventions Encouraged to attend Pulmonary Rehabilitation for the exercise    Continue Psychosocial Services  Follow up required by staff          Education: Education Goals: Education classes will be provided on a weekly basis, covering required topics. Participant will state understanding/return demonstration of topics presented.  Learning Barriers/Preferences:  Learning Barriers/Preferences - 11/08/23 1430       Learning Barriers/Preferences    Learning Barriers None    Learning Preferences None          General Pulmonary Education Topics:  Infection Prevention: - Provides verbal and written material to individual with discussion of infection control including proper hand washing and proper equipment cleaning during exercise session. Flowsheet Row Pulmonary Rehab from 11/08/2023 in [facility] Cardiac and Pulmonary Rehab  Date 11/08/23  Educator [facility]  Instruction Review Code 1- Verbalizes Understanding    Falls Prevention: - Provides verbal and written material to individual with discussion of falls prevention and safety. Flowsheet Row Pulmonary Rehab from 11/08/2023 in [facility] Cardiac and Pulmonary Rehab  Date 11/08/23  Educator [facility]  Instruction Review Code 1- Verbalizes Understanding    Chronic Lung Disease Review: - Group verbal instruction with posters, models, PowerPoint presentations  and videos,  to review new updates, new respiratory medications, new advancements in procedures and treatments. Providing information on websites and 800 numbers for continued self-education. Includes information about supplement oxygen, available portable oxygen systems, continuous and intermittent flow rates, oxygen safety, concentrators, and Medicare reimbursement for oxygen. Explanation of Pulmonary Drugs, including class, frequency, complications, importance of spacers, rinsing mouth after steroid MDI's, and proper cleaning methods for nebulizers. Review of basic lung anatomy and physiology related to function, structure, and complications of lung disease. Review of risk factors. Discussion about methods for diagnosing sleep apnea and types of masks and machines for OSA. Includes a review of the use of types of environmental controls: home humidity, furnaces, filters, dust mite/pet prevention, HEPA vacuums. Discussion about weather changes, air quality and the benefits of nasal washing. Instruction on Warning signs, infection symptoms, calling MD  promptly, preventive modes, and value of vaccinations. Review of effective airway clearance, coughing and/or vibration techniques. Emphasizing that all should Create an Action Plan. Written material provided at class time.   AED/CPR: - Group verbal and written instruction with the use of models to demonstrate the basic use of the AED with the basic ABC's of resuscitation.    Tests and Procedures:  - Group verbal and visual presentation and models provide information about basic cardiac anatomy and function. Reviews the testing methods done to diagnose heart disease and the outcomes of the test results. Describes the treatment choices: Medical Management, Angioplasty, or Coronary Bypass Surgery for treating various heart conditions including Myocardial Infarction, Angina, Valve Disease, and Cardiac Arrhythmias.  Written material provided at class time.   Medication Safety: - Group verbal and visual instruction to review commonly prescribed medications for heart and lung disease. Reviews the medication, class of the drug, and side effects. Includes the steps to properly store meds and maintain the prescription regimen.  Written material given at graduation.   Other: -Provides group and verbal instruction on various topics (see comments)   Knowledge Questionnaire Score:  Knowledge Questionnaire Score - 11/16/23 0936       Knowledge Questionnaire Score   Pre Score 12/18           Core Components/Risk Factors/Patient Goals at Admission:  Personal Goals and Risk Factors at Admission - 11/08/23 1429       Core Components/Risk Factors/Patient Goals on Admission    Weight Management Yes;Weight Gain    Intervention Weight Management: Develop a combined nutrition and exercise program designed to reach desired caloric intake, while maintaining appropriate intake of nutrient and fiber, sodium and fats, and appropriate energy expenditure required for the weight goal.;Weight Management: Provide  education and appropriate resources to help participant work on and attain dietary goals.;Obesity: Provide education and appropriate resources to help participant work on and attain dietary goals.    Expected Outcomes Short Term: Continue to assess and modify interventions until short term weight is achieved;Weight Loss: Understanding of general recommendations for a balanced deficit meal plan, which promotes 1-2 lb weight loss per week and includes a negative energy balance of [phone] kcal/d;Understanding recommendations for meals to include 15-35% energy as protein, 25-35% energy from fat, 35-60% energy from carbohydrates, less than 200mg  of dietary cholesterol, 20-35 gm of total fiber daily;Understanding of distribution of calorie intake throughout the day with the consumption of 4-5 meals/snacks;Weight Gain: Understanding of general recommendations for a high calorie, high protein meal plan that promotes weight gain by distributing calorie intake throughout the day with the consumption for 4-5 meals, snacks, and/or supplements  Improve shortness of breath with ADL's Yes    Intervention Provide education, individualized exercise plan and daily activity instruction to help decrease symptoms of SOB with activities of daily living.    Expected Outcomes Short Term: Improve cardiorespiratory fitness to achieve a reduction of symptoms when performing ADLs;Long Term: Be able to perform more ADLs without symptoms or delay the onset of symptoms    Hypertension Yes    Intervention Provide education on lifestyle modifcations including regular physical activity/exercise, weight management, moderate sodium restriction and increased consumption of fresh fruit, vegetables, and low fat dairy, alcohol moderation, and smoking cessation.;Monitor prescription use compliance.    Expected Outcomes Short Term: Continued assessment and intervention until BP is < 140/48mm HG in hypertensive participants. < 130/54mm HG in  hypertensive participants with diabetes, heart failure or chronic kidney disease.;Long Term: Maintenance of blood pressure at goal levels.    Lipids Yes    Intervention Provide education and support for participant on nutrition & aerobic/resistive exercise along with prescribed medications to achieve LDL 70mg , HDL >40mg .    Expected Outcomes Short Term: Participant states understanding of desired cholesterol values and is compliant with medications prescribed. Participant is following exercise prescription and nutrition guidelines.;Long Term: Cholesterol controlled with medications as prescribed, with individualized exercise RX and with personalized nutrition plan. Value goals: LDL < 70mg , HDL > 40 mg.          Education:Diabetes - Individual verbal and written instruction to review signs/symptoms of diabetes, desired ranges of glucose level fasting, after meals and with exercise. Acknowledge that pre and post exercise glucose checks will be done for 3 sessions at entry of program.   Know Your Numbers and Heart Failure: - Group verbal and visual instruction to discuss disease risk factors for cardiac and pulmonary disease and treatment options.  Reviews associated critical values for Overweight/Obesity, Hypertension, Cholesterol, and Diabetes.  Discusses basics of heart failure: signs/symptoms and treatments.  Introduces Heart Failure Zone chart for action plan for heart failure. Written material provided at class time.   Core Components/Risk Factors/Patient Goals Review:   Goals and Risk Factor Review     Row Name 12/14/23 0951 01/17/24 0931           Core Components/Risk Factors/Patient Goals Review   Personal Goals Review Improve shortness of breath with ADL's Other      Review Spoke to patient about their shortness of breath and what they can do to improve. Patient has been informed of breathing techniques when starting the program. Patient is informed to tell staff if they have had  any med changes and that certain meds they are taking or not taking can be causing shortness of breath. Diaphragmatic and PLB breathing explained and performed with patient. Patient has a better understanding of how to do these exercises to help with breathing performance and relaxation. Patient performed breathing techniques adequately and to practice further at home.      Expected Outcomes Short: Attend [facility] regularly to improve shortness of breath with ADL's. Long: maintain independence with ADL's Short: practice PLB and diaphragmatic breathing at home. Long: Use PLB and diaphragmatic breathing independently post [facility].         Core Components/Risk Factors/Patient Goals at Discharge (Final Review):   Goals and Risk Factor Review - 01/17/24 0931       Core Components/Risk Factors/Patient Goals Review   Personal Goals Review Other    Review Diaphragmatic and PLB breathing explained and performed with patient. Patient has a better understanding  of how to do these exercises to help with breathing performance and relaxation. Patient performed breathing techniques adequately and to practice further at home.    Expected Outcomes Short: practice PLB and diaphragmatic breathing at home. Long: Use PLB and diaphragmatic breathing independently post [facility].          ITP Comments:  ITP Comments     Row Name 11/08/23 1433 11/09/23 1110 11/16/23 0944 01/03/24 0843 01/31/24 0924   ITP Comments Virtual Visit completed. Patient informed on EP and RD appointment and 6 Minute walk test. Patient also informed of patient health questionnaires on My Chart. Patient Verbalizes understanding. Visit diagnosis can be found in [facility] 10/04/[facility]23. Completed and gym orientation for respiratory care services. Initial ITP created and sent for review to Dr. Faud Aleskerov, Medical Director. First full day of exercise!  Patient was oriented to gym and equipment including functions, settings, policies, and  procedures.  Patient's individual exercise prescription and treatment plan were reviewed.  All starting workloads were established based on the results of the 6 minute walk test done at initial orientation visit.  The plan for exercise progression was also introduced and progression will be customized based on patient's performance and goals. 30 Day review completed. Medical Director ITP review done, changes made as directed, and signed approval by Medical Director. new to program. 30 Day review completed. Medical Director ITP review done; changes made as directed and signed approval by Medical Director.      Comments: 30 day review

## 2024-02-01 ENCOUNTER — Encounter

## 2024-02-06 ENCOUNTER — Encounter

## 2024-02-07 ENCOUNTER — Encounter: Attending: Pulmonary Disease | Admitting: Emergency Medicine

## 2024-02-07 DIAGNOSIS — J449 Chronic obstructive pulmonary disease, unspecified: Secondary | ICD-10-CM | POA: Diagnosis present

## 2024-02-07 NOTE — Progress Notes (Signed)
 Daily Session Note  Patient Details  Name: Mindy Gali MRN: [id number] Date of Birth: 08/04/1945 Referring Provider:   [facility] from 11/09/2023 in [facility] Cardiac and Pulmonary Rehab  Referring Provider Dr. Halina Picking, MD    Encounter Date: 02/07/2024  Check In:  Session Check In - 02/07/24 0931       Check-In   Supervising physician immediately available to respond to emergencies See telemetry face sheet for immediately available ER MD    Location [facility]-Cardiac & Pulmonary Rehab    Staff Present Maxon Conetta BS, Exercise Physiologist;Joseph Rolinda RCP,RRT,BSRT;Heidy Mccubbin RN,BSN;Meredith Tressa RN,BSN    Virtual Visit No    Medication changes reported     No    Fall or balance concerns reported    No    Tobacco Cessation No Change    Warm-up and Cool-down Performed on first and last piece of equipment    Resistance Training Performed Yes    VAD Patient? No    PAD/SET Patient? No      Pain Assessment   Currently in Pain? No/denies             Social History   Tobacco Use  Smoking Status Former   Current packs/day: 0.00   Average packs/day: 0.3 packs/day for 50.0 years (12.5 ttl pk-yrs)   Types: Cigarettes   Start date: 01/10/1972   Quit date: 01/09/2022   Years since quitting: 2.0  Smokeless Tobacco Never    Goals Met:  Proper associated with RPD/PD & O2 Sat Independence with exercise equipment Using PLB without cueing & demonstrates good technique Exercise tolerated well No report of concerns or symptoms today Strength training completed today  Goals Unmet:  Not Applicable  Comments: Pt able to follow exercise prescription today without complaint.  Will continue to monitor for progression.    Dr. Oneil Pinal is Medical Director for [facility] Cardiac Rehabilitation.  Dr. Fuad Aleskerov is Medical Director for [facility] Pulmonary Rehabilitation.

## 2024-02-08 ENCOUNTER — Encounter: Admitting: Emergency Medicine

## 2024-02-08 DIAGNOSIS — J449 Chronic obstructive pulmonary disease, unspecified: Secondary | ICD-10-CM

## 2024-02-08 NOTE — Progress Notes (Signed)
 Daily Session Note  Patient Details  Name: Taylor Chambers MRN: [id number] Date of Birth: 1946-05-04 Referring Provider:   [facility] from 11/09/2023 in [facility] Cardiac and Pulmonary Rehab  Referring Provider Dr. Halina Picking, MD    Encounter Date: 02/08/2024  Check In:  Session Check In - 02/08/24 1004       Check-In   Supervising physician immediately available to respond to emergencies See telemetry face sheet for immediately available ER MD    Location [facility]-Cardiac & Pulmonary Rehab    Staff Present Devaughn Jaeger, BS, Exercise Physiologist;Joseph Rolinda RCP,RRT,BSRT;Kinsley Holderman RN,BSN;Maxon Sheldon BS, Exercise Physiologist    Virtual Visit No    Medication changes reported     No    Fall or balance concerns reported    No    Tobacco Cessation No Change    Warm-up and Cool-down Performed on first and last piece of equipment    Resistance Training Performed Yes    VAD Patient? No    PAD/SET Patient? No      Pain Assessment   Currently in Pain? No/denies             Social History   Tobacco Use  Smoking Status Former   Current packs/day: 0.00   Average packs/day: 0.3 packs/day for 50.0 years (12.5 ttl pk-yrs)   Types: Cigarettes   Start date: 01/10/1972   Quit date: 01/09/2022   Years since quitting: 2.0  Smokeless Tobacco Never    Goals Met:  Proper associated with RPD/PD & O2 Sat Independence with exercise equipment Using PLB without cueing & demonstrates good technique Exercise tolerated well No report of concerns or symptoms today Strength training completed today  Goals Unmet:  Not Applicable  Comments: Pt able to follow exercise prescription today without complaint.  Will continue to monitor for progression.    Dr. Oneil Pinal is Medical Director for [facility] Cardiac Rehabilitation.  Dr. Fuad Aleskerov is Medical Director for [facility] Pulmonary Rehabilitation.

## 2024-02-13 ENCOUNTER — Encounter

## 2024-02-14 ENCOUNTER — Encounter: Admitting: Emergency Medicine

## 2024-02-14 DIAGNOSIS — J449 Chronic obstructive pulmonary disease, unspecified: Secondary | ICD-10-CM | POA: Diagnosis not present

## 2024-02-14 NOTE — Progress Notes (Signed)
 Daily Session Note  Patient Details  Name: Taylor Chambers MRN: [id number] Date of Birth: 1946/04/13 Referring Provider:   [facility] from 11/09/2023 in [facility] Cardiac and Pulmonary Rehab  Referring Provider Dr. Halina Picking, MD    Encounter Date: 02/14/2024  Check In:  Session Check In - 02/14/24 0953       Check-In   Supervising physician immediately available to respond to emergencies See telemetry face sheet for immediately available ER MD    Staff Present Rollene Paterson, MS, Exercise Physiologist;Maxon Conetta BS, Exercise Physiologist;Coumba Kellison RN,BSN    Virtual Visit No    Medication changes reported     No    Fall or balance concerns reported    No    Tobacco Cessation No Change    Warm-up and Cool-down Performed on first and last piece of equipment    Resistance Training Performed Yes    VAD Patient? No    PAD/SET Patient? No      Pain Assessment   Currently in Pain? No/denies             Social History   Tobacco Use  Smoking Status Former   Current packs/day: 0.00   Average packs/day: 0.3 packs/day for 50.0 years (12.5 ttl pk-yrs)   Types: Cigarettes   Start date: 01/10/1972   Quit date: 01/09/2022   Years since quitting: 2.0  Smokeless Tobacco Never    Goals Met:  Proper associated with RPD/PD & O2 Sat Independence with exercise equipment Using PLB without cueing & demonstrates good technique Exercise tolerated well No report of concerns or symptoms today Strength training completed today  Goals Unmet:  Not Applicable  Comments: Pt able to follow exercise prescription today without complaint.  Will continue to monitor for progression.    Dr. Oneil Pinal is Medical Director for [facility] Cardiac Rehabilitation.  Dr. Fuad Aleskerov is Medical Director for [facility] Pulmonary Rehabilitation.

## 2024-02-15 ENCOUNTER — Encounter: Admitting: Emergency Medicine

## 2024-02-15 DIAGNOSIS — J449 Chronic obstructive pulmonary disease, unspecified: Secondary | ICD-10-CM | POA: Diagnosis not present

## 2024-02-15 NOTE — Progress Notes (Signed)
 Daily Session Note  Patient Details  Name: Eboney Claybrook MRN: [id number] Date of Birth: 09-17-1945 Referring Provider:   [facility] from 11/09/2023 in [facility] Cardiac and Pulmonary Rehab  Referring Provider Dr. Halina Picking, MD    Encounter Date: 02/15/2024  Check In:  Session Check In - 02/15/24 0918       Check-In   Supervising physician immediately available to respond to emergencies See telemetry face sheet for immediately available ER MD    Location [facility]-Cardiac & Pulmonary Rehab    Staff Present Devaughn Jaeger, BS, Exercise Physiologist;Maxon Conetta BS, Exercise Physiologist;Joseph Rolinda RCP,RRT,BSRT;Raygen Dahm RN,BSN    Virtual Visit No    Medication changes reported     No    Fall or balance concerns reported    No    Tobacco Cessation No Change    Warm-up and Cool-down Performed on first and last piece of equipment    Resistance Training Performed Yes    VAD Patient? No    PAD/SET Patient? No      Pain Assessment   Currently in Pain? No/denies             Social History   Tobacco Use  Smoking Status Former   Current packs/day: 0.00   Average packs/day: 0.3 packs/day for 50.0 years (12.5 ttl pk-yrs)   Types: Cigarettes   Start date: 01/10/1972   Quit date: 01/09/2022   Years since quitting: 2.1  Smokeless Tobacco Never    Goals Met:  Proper associated with RPD/PD & O2 Sat Independence with exercise equipment Using PLB without cueing & demonstrates good technique Exercise tolerated well No report of concerns or symptoms today Strength training completed today  Goals Unmet:  Not Applicable  Comments: Pt able to follow exercise prescription today without complaint.  Will continue to monitor for progression.    Dr. Oneil Pinal is Medical Director for [facility] Cardiac Rehabilitation.  Dr. Fuad Aleskerov is Medical Director for [facility] Pulmonary Rehabilitation.

## 2024-02-20 ENCOUNTER — Encounter

## 2024-02-21 ENCOUNTER — Encounter

## 2024-02-21 DIAGNOSIS — J449 Chronic obstructive pulmonary disease, unspecified: Secondary | ICD-10-CM

## 2024-02-21 NOTE — Progress Notes (Signed)
 Daily Session Note  Patient Details  Name: Taylor Chambers MRN: [id number] Date of Birth: 1946/05/14 Referring Provider:   [facility] from 11/09/2023 in [facility] Cardiac and Pulmonary Rehab  Referring Provider Dr. Halina Picking, MD    Encounter Date: 02/21/2024  Check In:  Session Check In - 02/21/24 0918       Check-In   Supervising physician immediately available to respond to emergencies See telemetry face sheet for immediately available ER MD    Location [facility]-Cardiac & Pulmonary Rehab    Staff Present Burnard Davenport RN,BSN,MPA;[facility] RCP,RRT,BSRT;Margaret Best, MS, Exercise Physiologist;Jason Elnor RDN,LDN    Virtual Visit No    Medication changes reported     No    Fall or balance concerns reported    No    Tobacco Cessation No Change    Warm-up and Cool-down Performed on first and last piece of equipment    Resistance Training Performed Yes    VAD Patient? No    PAD/SET Patient? No      Pain Assessment   Currently in Pain? No/denies             Social History   Tobacco Use  Smoking Status Former   Current packs/day: 0.00   Average packs/day: 0.3 packs/day for 50.0 years (12.5 ttl pk-yrs)   Types: Cigarettes   Start date: 01/10/1972   Quit date: 01/09/2022   Years since quitting: 2.1  Smokeless Tobacco Never    Goals Met:  Proper associated with RPD/PD & O2 Sat Independence with exercise equipment Using PLB without cueing & demonstrates good technique Exercise tolerated well No report of concerns or symptoms today Strength training completed today  Goals Unmet:  Not Applicable  Comments: Pt able to follow exercise prescription today without complaint.  Will continue to monitor for progression.    Dr. Oneil Pinal is Medical Director for [facility] Cardiac Rehabilitation.  Dr. Fuad Aleskerov is Medical Director for [facility] Pulmonary Rehabilitation.

## 2024-02-22 ENCOUNTER — Encounter: Admitting: Emergency Medicine

## 2024-02-22 DIAGNOSIS — J449 Chronic obstructive pulmonary disease, unspecified: Secondary | ICD-10-CM

## 2024-02-22 NOTE — Progress Notes (Signed)
 Daily Session Note  Patient Details  Name: Taylor Chambers MRN: [id number] Date of Birth: 1945-06-10 Referring Provider:   [facility] from 11/09/2023 in [facility] Cardiac and Pulmonary Rehab  Referring Provider Dr. Halina Picking, MD    Encounter Date: 02/22/2024  Check In:  Session Check In - 02/22/24 0934       Check-In   Supervising physician immediately available to respond to emergencies See telemetry face sheet for immediately available ER MD    Location [facility]-Cardiac & Pulmonary Rehab    Staff Present Selinda Pereyra RDN,LDN;Margaret Best, MS, Exercise Physiologist;Joseph Rolinda RCP,RRT,BSRT;Tabithia Stroder RN,BSN    Virtual Visit No    Medication changes reported     No    Fall or balance concerns reported    No    Tobacco Cessation No Change    Warm-up and Cool-down Performed on first and last piece of equipment    Resistance Training Performed Yes    VAD Patient? No    PAD/SET Patient? No      Pain Assessment   Currently in Pain? No/denies             Social History   Tobacco Use  Smoking Status Former   Current packs/day: 0.00   Average packs/day: 0.3 packs/day for 50.0 years (12.5 ttl pk-yrs)   Types: Cigarettes   Start date: 01/10/1972   Quit date: 01/09/2022   Years since quitting: 2.1  Smokeless Tobacco Never    Goals Met:  Proper associated with RPD/PD & O2 Sat Independence with exercise equipment Using PLB without cueing & demonstrates good technique Exercise tolerated well No report of concerns or symptoms today Strength training completed today  Goals Unmet:  Not Applicable  Comments: Pt able to follow exercise prescription today without complaint.  Will continue to monitor for progression.    Dr. Oneil Pinal is Medical Director for [facility] Cardiac Rehabilitation.  Dr. Fuad Aleskerov is Medical Director for [facility] Pulmonary Rehabilitation.

## 2024-02-27 ENCOUNTER — Encounter

## 2024-02-28 ENCOUNTER — Encounter

## 2024-02-28 DIAGNOSIS — J449 Chronic obstructive pulmonary disease, unspecified: Secondary | ICD-10-CM | POA: Diagnosis not present

## 2024-02-28 NOTE — Progress Notes (Signed)
 Pulmonary Individual Treatment Plan  Patient Details  Name: Taylor Chambers MRN: [id number] Date of Birth: June 08, 1945 Referring Provider:   Conrad Ports Pulmonary Rehab from 11/09/2023 in [facility] Cardiac and Pulmonary Rehab  Referring Provider Dr. Halina Picking, MD    Initial Encounter Date:  Flowsheet Row Pulmonary Rehab from 11/09/2023 in [facility] Cardiac and Pulmonary Rehab  Date 11/09/23    Visit Diagnosis: Chronic obstructive pulmonary disease, unspecified COPD type (HCC)  Patient's Home Medications on Admission:  Current Outpatient Medications:    albuterol  (VENTOLIN  HFA) 108 (90 Base) MCG/ACT inhaler, Inhale 2 puffs into the lungs every 6 (six) hours as needed for wheezing or shortness of breath., Disp: 8 g, Rfl: 2   apixaban  (ELIQUIS ) 5 MG TABS tablet, TAKE 1 TABLET BY MOUTH TWICE A DAY, Disp: 180 tablet, Rfl: 1   budesonide  (PULMICORT ) 0.25 MG/2ML nebulizer solution, SMARTSIG:2 Milliliter(s) Twice Daily (Patient not taking: Reported on 12/19/2023), Disp: , Rfl:    Calcium Carbonate (CALCIUM 500 PO), Take 500 mg by mouth daily at 12 noon. (Patient not taking: Reported on 12/19/2023), Disp: , Rfl:    Cholecalciferol  (VITAMIN D3) 1.25 MG (50000 UT) CAPS, Take 5,000 Units by mouth daily in the afternoon., Disp: , Rfl:    COMBIVENT  RESPIMAT 20-100 MCG/ACT AERS respimat, Inhale into the lungs. (Patient not taking: Reported on 12/19/2023), Disp: , Rfl:    cyclobenzaprine (FLEXERIL) 10 MG tablet, Take 10 mg by mouth 2 (two) times daily as needed for muscle spasms. (Patient not taking: Reported on 12/19/2023), Disp: , Rfl:    divalproex  (DEPAKOTE ) 250 MG DR tablet, Take 250 mg by mouth., Disp: , Rfl:    Dupilumab  300 MG/2ML SOAJ, Inject 300 mg into the skin every 14 (fourteen) days. Saturday (Patient not taking: Reported on 12/19/2023), Disp: , Rfl:    DUPIXENT  300 MG/2ML prefilled syringe, Inject 300 mg into the skin., Disp: , Rfl:    EPINEPHrine 0.3 mg/0.3 mL IJ SOAJ injection, Inject 0.3 mg into  the muscle as needed for anaphylaxis., Disp: , Rfl:    furosemide  (LASIX ) 40 MG tablet, Take 1 tablet (40 mg total) by mouth daily. (Patient not taking: Reported on 12/19/2023), Disp: 30 tablet, Rfl: 0   ipratropium (ATROVENT ) 0.06 % nasal spray, Place 1 spray into both nostrils daily., Disp: , Rfl:    ipratropium-albuterol  (DUONEB) 0.5-2.5 (3) MG/3ML SOLN, Take 3 mLs by nebulization 2 (two) times daily. Mix with budesonide  (Patient not taking: Reported on 12/19/2023), Disp: , Rfl:    ipratropium-albuterol  (DUONEB) 0.5-2.5 (3) MG/3ML SOLN, Inhale 3 mLs into the lungs. (Patient not taking: Reported on 12/19/2023), Disp: , Rfl:    losartan  (COZAAR ) 100 MG tablet, Take 1 tablet (100 mg total) by mouth daily., Disp: 90 tablet, Rfl: 3   metoprolol  succinate (TOPROL -XL) 25 MG 24 hr tablet, Take 1 tablet (25 mg total) by mouth 2 (two) times daily. (Patient not taking: Reported on 12/19/2023), Disp: 60 tablet, Rfl: 0   Multiple Vitamin (MULTI-VITAMIN) tablet, Take 1 tablet by mouth daily., Disp: , Rfl:    Multiple Vitamins-Minerals (PRESERVISION AREDS 2+MULTI VIT PO), Take 2 tablets by mouth daily. (Patient not taking: Reported on 12/19/2023), Disp: , Rfl:    OHTUVAYRE  3 MG/2.5ML SUSP, Take 3 mg by nebulization daily. (Patient not taking: Reported on 12/19/2023), Disp: , Rfl:    omeprazole (PRILOSEC) 40 MG capsule, Take 40 mg by mouth daily as needed (Heartburn). (Patient not taking: Reported on 12/19/2023), Disp: , Rfl:    oxybutynin (DITROPAN) 5 MG tablet,  Take 5 mg by mouth. (Patient not taking: Reported on 12/19/2023), Disp: , Rfl:    Potassium Chloride  40 MEQ/15ML (20%) SOLN, Take 20 mEq by mouth. (Patient not taking: Reported on 12/19/2023), Disp: , Rfl:    potassium chloride  SA (KLOR-CON  M) 20 MEQ tablet, Take 1 tablet (20 mEq total) by mouth daily. (Patient not taking: Reported on 12/19/2023), Disp: 90 tablet, Rfl: 3   pravastatin  (PRAVACHOL ) 10 MG tablet, Take 10 mg by mouth daily., Disp: , Rfl:    sertraline   (ZOLOFT ) 100 MG tablet, Take 1 tablet (100 mg total) by mouth daily. (Patient not taking: Reported on 12/19/2023), Disp: 30 tablet, Rfl: 11   sulfamethoxazole -trimethoprim  (BACTRIM ) 400-80 MG tablet, Take 1 tablet by mouth 3 (three) times a week., Disp: , Rfl:   Past Medical History: Past Medical History:  Diagnosis Date   Allergies    Anxiety    Aortic atherosclerosis    Bipolar affective disorder (HCC)    COPD (chronic obstructive pulmonary disease) (HCC)    Coronary artery calcification seen on CT scan    Depression    Diastolic dysfunction 02/24/2020   a.) TTE 02/24/2020: EF >55%; triv PR, mild TR, mod MR; G1DD.   DOE (dyspnea on exertion)    Emphysema lung (HCC)    History of cataract    HLD (hyperlipidemia)    Hypertension    Macular degeneration    Migraines    Osteoporosis    Pneumonia    Tobacco use     Tobacco Use: Social History   Tobacco Use  Smoking Status Former   Current packs/day: 0.00   Average packs/day: 0.3 packs/day for 50.0 years (12.5 ttl pk-yrs)   Types: Cigarettes   Start date: 01/10/1972   Quit date: 01/09/2022   Years since quitting: 2.1  Smokeless Tobacco Never    Labs: Review Flowsheet  More data exists      Latest Ref Rng & Units 06/27/2023 06/29/2023 07/02/2023 07/17/2023 08/21/2023  Labs for ITP Cardiac and Pulmonary Rehab  PH, Arterial 7.35 - 7.45 7.44  - 7.6  7.4  -  PCO2 arterial 32 - 48 mmHg 46  - 47  33  -  Bicarbonate 20.0 - 28.0 mmol/L 31.2  26.8  26.6  46.1  20.4  25.0   Acid-base deficit 0.0 - 2.0 mmol/L - 0.1  4.5  - 3.6  3.1   O2 Saturation % 99.9  95  55.1  97.1  89.2  90.4     Details       Multiple values from one day are sorted in reverse-chronological order          Pulmonary Assessment Scores:  Pulmonary Assessment Scores     Row Name 11/09/23 1110 11/16/23 0936       ADL UCSD   ADL Phase -- Entry    SOB Score total -- 53    Rest -- 1    Walk -- 0    Stairs -- 5    Bath -- 0    Dress -- 0    Shop -- 2       CAT Score   CAT Score -- 19      mMRC Score   mMRC Score 2 2       UCSD: Self-administered rating of dyspnea associated with activities of daily living (ADLs) 6-point scale (0 = not at all to 5 = maximal or unable to do because of breathlessness)  Scoring Scores range from 0 to 120.  Minimally important difference is 5 units  CAT: CAT can identify the health impairment of COPD patients and is better correlated with disease progression.  CAT has a scoring range of zero to 40. The CAT score is classified into four groups of low (less than 10), medium (10 - 20), high (21-30) and very high (31-40) based on the impact level of disease on health status. A CAT score over 10 suggests significant symptoms.  A worsening CAT score could be explained by an exacerbation, poor medication adherence, poor inhaler technique, or progression of COPD or comorbid conditions.  CAT MCID is 2 points  mMRC: mMRC (Modified Medical Research Council) Dyspnea Scale is used to assess the degree of baseline functional disability in patients of respiratory disease due to dyspnea. No minimal important difference is established. A decrease in score of 1 point or greater is considered a positive change.   Pulmonary Function Assessment:  Pulmonary Function Assessment - 11/08/23 1429       Breath   Shortness of Breath Yes;Fear of Shortness of Breath;Limiting activity;Panic with Shortness of Breath          Exercise Target Goals: Exercise Program Goal: Individual exercise prescription set using results from initial 6 min walk test and THRR while considering  patient's activity barriers and safety.   Exercise Prescription Goal: Initial exercise prescription builds to 30-45 minutes a day of aerobic activity, 2-3 days per week.  Home exercise guidelines will be given to patient during program as part of exercise prescription that the participant will acknowledge.  Education: Aerobic Exercise: - Group verbal  and visual presentation on the components of exercise prescription. Introduces F.I.T.T principle from ACSM for exercise prescriptions.  Reviews F.I.T.T. principles of aerobic exercise including progression. Written material provided at class time.   Education: Resistance Exercise: - Group verbal and visual presentation on the components of exercise prescription. Introduces F.I.T.T principle from ACSM for exercise prescriptions  Reviews F.I.T.T. principles of resistance exercise including progression. Written material provided at class time.    Education: Exercise & Equipment Safety: - Individual verbal instruction and demonstration of equipment use and safety with use of the equipment. Flowsheet Row Pulmonary Rehab from 11/08/2023 in [facility] Cardiac and Pulmonary Rehab  Date 11/08/23  Educator [facility]  Instruction Review Code 1- Verbalizes Understanding    Education: Exercise Physiology & General Exercise Guidelines: - Group verbal and written instruction with models to review the exercise physiology of the cardiovascular system and associated critical values. Provides general exercise guidelines with specific guidelines to those with heart or lung disease.    Education: Flexibility, Balance, Mind/Body Relaxation: - Group verbal and visual presentation with interactive activity on the components of exercise prescription. Introduces F.I.T.T principle from ACSM for exercise prescriptions. Reviews F.I.T.T. principles of flexibility and balance exercise training including progression. Also discusses the mind body connection.  Reviews various relaxation techniques to help reduce and manage stress (i.e. Deep breathing, progressive muscle relaxation, and visualization). Balance handout provided to take home. Written material provided at class time.   Activity Barriers & Risk Stratification:  Activity Barriers & Cardiac Risk Stratification - 11/09/23 1123       Activity Barriers & Cardiac Risk  Stratification   Activity Barriers Right Hip Replacement;Other (comment);Assistive Device;Shortness of Breath;History of Falls;Balance Concerns    Comments osteoporosis          6 Minute Walk:  6 Minute Walk     Row Name 11/09/23 1117         6  Minute Walk   Phase Initial     Distance 455 feet     Walk Time 6 minutes     # of Rest Breaks 0     MPH 0.86     METS 1.68     RPE 15     Perceived Dyspnea  4     VO2 Peak 5.88     Symptoms Yes (comment)     Comments used walker     Resting HR 85 bpm     Resting BP 132/68     Resting Oxygen Saturation  95 %     Exercise Oxygen Saturation  during 6 min walk 90 %     Max Ex. HR 105 bpm     Max Ex. BP 154/74     2 Minute Post BP 142/72       Interval HR   1 Minute HR 102     2 Minute HR 105     3 Minute HR 104     4 Minute HR 105     5 Minute HR 101     6 Minute HR 98     2 Minute Post HR 93     Interval Heart Rate? Yes       Interval Oxygen   Interval Oxygen? Yes     Baseline Oxygen Saturation % 95 %     1 Minute Oxygen Saturation % 91 %     1 Minute Liters of Oxygen 3 L  pulsed     2 Minute Oxygen Saturation % 92 %     2 Minute Liters of Oxygen 3 L     3 Minute Oxygen Saturation % 91 %     3 Minute Liters of Oxygen 3 L     4 Minute Oxygen Saturation % 90 %     4 Minute Liters of Oxygen 3 L     5 Minute Oxygen Saturation % 91 %     5 Minute Liters of Oxygen 3 L     6 Minute Oxygen Saturation % 91 %     6 Minute Liters of Oxygen 3 L     2 Minute Post Oxygen Saturation % 95 %     2 Minute Post Liters of Oxygen 3 L       Oxygen Initial Assessment:  Oxygen Initial Assessment - 11/08/23 1428       Home Oxygen   Home Oxygen Device None    Sleep Oxygen Prescription Continuous;BiPAP    Liters per minute 3    Home Exercise Oxygen Prescription None    Home Resting Oxygen Prescription None    Compliance with Home Oxygen Use Yes      Initial 6 min Walk   Oxygen Used None      Program Oxygen Prescription    Program Oxygen Prescription None      Intervention   Short Term Goals To learn and demonstrate proper pursed lip breathing techniques or other breathing techniques. ;To learn and understand importance of monitoring SPO2 with pulse oximeter and demonstrate accurate use of the pulse oximeter.;To learn and exhibit compliance with exercise, home and travel O2 prescription;To learn and understand importance of maintaining oxygen saturations>88%    Long  Term Goals Verbalizes importance of monitoring SPO2 with pulse oximeter and return demonstration;Exhibits proper breathing techniques, such as pursed lip breathing or other method taught during program session;Demonstrates proper use of MDI's;Exhibits compliance with exercise, home  and travel O2 prescription;Maintenance  of O2 saturations>88%;Compliance with respiratory medication          Oxygen Re-Evaluation:  Oxygen Re-Evaluation     Row Name 11/16/23 0946 12/14/23 0948 01/17/24 0930 02/08/24 1002       Program Oxygen Prescription   Program Oxygen Prescription None Continuous;E-Tanks Continuous;E-Tanks Continuous;E-Tanks    Liters per minute -- 3 3 3       Home Oxygen   Home Oxygen Device None E-Tanks;Home Concentrator;Portable Concentrator E-Tanks;Home Concentrator;Portable Concentrator E-Tanks;Home Concentrator;Portable Concentrator    Sleep Oxygen Prescription Continuous;BiPAP BiPAP BiPAP BiPAP    Liters per minute 3 3 3 3     Home Exercise Oxygen Prescription None Continuous Continuous Continuous    Liters per minute -- 3 3 3     Home Resting Oxygen Prescription None None None None    Compliance with Home Oxygen Use Yes Yes Yes Yes      Goals/Expected Outcomes   Short Term Goals To learn and demonstrate proper pursed lip breathing techniques or other breathing techniques.  To learn and demonstrate proper pursed lip breathing techniques or other breathing techniques.  To learn and understand importance of maintaining oxygen  saturations>88%;To learn and understand importance of monitoring SPO2 with pulse oximeter and demonstrate accurate use of the pulse oximeter. Other;To learn and demonstrate proper pursed lip breathing techniques or other breathing techniques.     Long  Term Goals Exhibits proper breathing techniques, such as pursed lip breathing or other method taught during program session Exhibits proper breathing techniques, such as pursed lip breathing or other method taught during program session Maintenance of O2 saturations>88%;Verbalizes importance of monitoring SPO2 with pulse oximeter and return demonstration Other;Exhibits proper breathing techniques, such as pursed lip breathing or other method taught during program session    Comments Reviewed PLB technique with pt.  Talked about how it works and it's importance in maintaining their exercise saturations. Informed patient how to perform the Pursed Lipped breathing technique. Told patient to Inhale through the nose and out the mouth with pursed lips to keep their airways open, help oxygenate them better, practice when at rest or doing strenuous activity. Patient Verbalizes understanding of technique and will work on and be reiterated during [facility]. Ashonte has a pulse oximeter to check her oxygen saturation at home. Informed and explained why it is important to have one. Reviewed that oxygen saturations should be 88 percent and above. Diaphragmatic and PLB breathing explained and performed with patient. Patient has a better understanding of how to do these exercises to help with breathing performance and relaxation. Patient performed breathing techniques adequately and to practice further at home.    Goals/Expected Outcomes Short: Become more profiecient at using PLB. Long: Become independent at using PLB. Short: use PLB with exertion. Long: use PLB on exertion proficiently and independently. Short: monitor oxygen at home with exertion. Long: maintain oxygen  saturations above 88 percent independently. Short: Practice PLB and diaphragmatic breathing at home. Long: Use PLB and diaphragmatic breathing independently post [facility].       Oxygen Discharge (Final Oxygen Re-Evaluation):  Oxygen Re-Evaluation - 02/08/24 1002       Program Oxygen Prescription   Program Oxygen Prescription Continuous;E-Tanks    Liters per minute 3      Home Oxygen   Home Oxygen Device E-Tanks;Home Concentrator;Portable Concentrator    Sleep Oxygen Prescription BiPAP    Liters per minute 3    Home Exercise Oxygen Prescription Continuous    Liters per minute 3    Home Resting Oxygen  Prescription None    Compliance with Home Oxygen Use Yes      Goals/Expected Outcomes   Short Term Goals Other;To learn and demonstrate proper pursed lip breathing techniques or other breathing techniques.     Long  Term Goals Other;Exhibits proper breathing techniques, such as pursed lip breathing or other method taught during program session    Comments Diaphragmatic and PLB breathing explained and performed with patient. Patient has a better understanding of how to do these exercises to help with breathing performance and relaxation. Patient performed breathing techniques adequately and to practice further at home.    Goals/Expected Outcomes Short: Practice PLB and diaphragmatic breathing at home. Long: Use PLB and diaphragmatic breathing independently post [facility].          Initial Exercise Prescription:  Initial Exercise Prescription - 11/09/23 1100       Date of Initial Exercise RX and Referring Provider   Date 11/09/23    Referring Provider Dr. Fuad Aleskerov, MD      Oxygen   Oxygen Continuous    Liters 3    Maintain Oxygen Saturation 88% or higher      Recumbant Bike   Level 1    RPM 50    Watts 15    Minutes 15    METs 1.68      NuStep   Level 1    SPM 80    Minutes 15    METs 1.68      Track   Laps 12    Minutes 15    METs 1.65      Prescription  Details   Frequency (times per week) 2    Duration Progress to 30 minutes of continuous aerobic without signs/symptoms of physical distress      Intensity   THRR 40-80% of Max Heartrate 108-132    Ratings of Perceived Exertion 11-13    Perceived Dyspnea 0-4      Progression   Progression Continue to progress workloads to maintain intensity without signs/symptoms of physical distress.      Resistance Training   Training Prescription Yes    Weight 2 lb    Reps 10-15          Perform Capillary Blood Glucose checks as needed.  Exercise Prescription Changes:   Exercise Prescription Changes     Row Name 11/09/23 1100 11/21/23 1400 12/06/23 1300 12/12/23 1000 12/20/23 1600     Response to Exercise   Blood Pressure (Admit) 132/68 132/70 132/60 -- 118/64   Blood Pressure (Exercise) 154/74 148/68 156/64 -- 160/70   Blood Pressure (Exit) 142/72 126/68 120/66 -- 128/60   Heart Rate (Admit) 85 bpm 74 bpm 81 bpm -- 95 bpm   Heart Rate (Exercise) 105 bpm 98 bpm 110 bpm -- 103 bpm   Heart Rate (Exit) 93 bpm 90 bpm 72 bpm -- 87 bpm   Oxygen Saturation (Admit) 95 % 98 % 96 % -- 90 %   Oxygen Saturation (Exercise) 90 % 92 % 89 % -- 98 %   Oxygen Saturation (Exit) 95 % 93 % 91 % -- 94 %   Rating of Perceived Exertion (Exercise) 15 15 14  -- 15   Perceived Dyspnea (Exercise) 4 3 2  -- 3   Symptoms used walker none none -- none   Comments Results 1st day of exercise -- -- --   Duration -- Continue with 30 min of aerobic exercise without signs/symptoms of physical distress. Continue with 30 min of aerobic  exercise without signs/symptoms of physical distress. -- Continue with 30 min of aerobic exercise without signs/symptoms of physical distress.   Intensity -- THRR unchanged THRR unchanged -- THRR unchanged     Progression   Progression -- Continue to progress workloads to maintain intensity without signs/symptoms of physical distress. Continue to progress workloads to maintain intensity  without signs/symptoms of physical distress. -- Continue to progress workloads to maintain intensity without signs/symptoms of physical distress.   Average METs -- 2.4 1.81 -- 1.48     Resistance Training   Training Prescription -- Yes Yes -- Yes   Weight -- 2 lb 2 lb -- 2 lb   Reps -- 10-15 10-15 -- 10-15     Interval Training   Interval Training -- No No -- No     Oxygen   Oxygen -- Continuous Continuous -- Continuous   Liters -- 2 2 -- 2-3     Recumbant Bike   Level -- 1 -- -- --   Watts -- 15 -- -- --   Minutes -- 15 -- -- --   METs -- 2.99 -- -- --     NuStep   Level -- 1 2 -- 1   Minutes -- 15 15 -- 15   METs -- 1.8 1.6 -- 1.5     Arm Ergometer   Level -- -- -- -- 1   Minutes -- -- -- -- 15   METs -- -- -- -- 1     Track   Laps -- -- 32 -- 24   Minutes -- -- 15 -- 15   METs -- -- 2.74 -- 2.31     Home Exercise Plan   Plans to continue exercise at -- -- -- Home (comment)  walking, resistance w/ weights, keeping up with 69 year old grandbaby Home (comment)  walking, resistance w/ weights, keeping up with 80 year old grandbaby   Frequency -- -- -- Add 3 additional days to program exercise sessions. Add 3 additional days to program exercise sessions.   Initial Home Exercises Provided -- -- -- 12/12/23 12/12/23     Oxygen   Maintain Oxygen Saturation -- 88% or higher 88% or higher -- 88% or higher    Row Name 01/02/24 1400 01/16/24 1500 01/31/24 1400 02/15/24 1800       Response to Exercise   Blood Pressure (Admit) 124/70 136/64 122/68 136/6    Blood Pressure (Exercise) -- 164/62 -- --    Blood Pressure (Exit) 130/60 134/58 128/78 130/68    Heart Rate (Admit) 97 bpm 87 bpm 91 bpm 93 bpm    Heart Rate (Exercise) 111 bpm 105 bpm 104 bpm 103 bpm    Heart Rate (Exit) 95 bpm 84 bpm 78 bpm 87 bpm    Oxygen Saturation (Admit) 93 % 95 % 92 % 89 %    Oxygen Saturation (Exercise) 88 % 88 % 86 % 86 %  came up to 88 with rest    Oxygen Saturation (Exit) 93 % 90 % 96 % 94 %     Rating of Perceived Exertion (Exercise) 17 14 15 13     Perceived Dyspnea (Exercise) 4 2 2 3     Symptoms none none none none    Duration Continue with 30 min of aerobic exercise without signs/symptoms of physical distress. Continue with 30 min of aerobic exercise without signs/symptoms of physical distress. Continue with 30 min of aerobic exercise without signs/symptoms of physical distress. Continue with 30 min of aerobic  exercise without signs/symptoms of physical distress.    Intensity THRR unchanged THRR unchanged THRR unchanged THRR unchanged      Progression   Progression Continue to progress workloads to maintain intensity without signs/symptoms of physical distress. Continue to progress workloads to maintain intensity without signs/symptoms of physical distress. Continue to progress workloads to maintain intensity without signs/symptoms of physical distress. Continue to progress workloads to maintain intensity without signs/symptoms of physical distress.    Average METs 2.04 2.21 2.03 1.95      Resistance Training   Training Prescription Yes Yes Yes Yes    Weight 2 lb 2 lb 2 lb 2 lb    Reps 10-15 10-15 10-15 10-15      Interval Training   Interval Training No No No No      Oxygen   Oxygen Continuous Continuous Continuous Continuous    Liters 3 3 3  3-4      NuStep   Level 1 1 2 3     Minutes 15 15 15 15     METs 1.6 1.6 2 1.6      Track   Laps 52 52 42 30    Minutes 15 15 15 15     METs 3.8 3.83 3.28 2.63      Home Exercise Plan   Plans to continue exercise at Home (comment)  walking, resistance w/ weights, keeping up with 34 year old grandbaby Home (comment)  walking, resistance w/ weights, keeping up with 67 year old grandbaby Home (comment)  walking, resistance w/ weights, keeping up with 46 year old grandbaby Home (comment)  walking, resistance w/ weights, keeping up with 74 year old grandbaby    Frequency Add 3 additional days to program exercise sessions. Add 3 additional  days to program exercise sessions. Add 3 additional days to program exercise sessions. Add 3 additional days to program exercise sessions.    Initial Home Exercises Provided 12/12/23 12/12/23 12/12/23 12/12/23      Oxygen   Maintain Oxygen Saturation 88% or higher 88% or higher 88% or higher 88% or higher       Exercise Comments:   Exercise Comments     Row Name 11/16/23 0945           Exercise Comments First full day of exercise!  Patient was oriented to gym and equipment including functions, settings, policies, and procedures.  Patient's individual exercise prescription and treatment plan were reviewed.  All starting workloads were established based on the results of the 6 minute walk test done at initial orientation visit.  The plan for exercise progression was also introduced and progression will be customized based on patient's performance and goals.          Exercise Goals and Review:   Exercise Goals     Row Name 11/09/23 1124             Exercise Goals   Increase Physical Activity Yes       Intervention Provide advice, education, support and counseling about physical activity/exercise needs.;Develop an individualized exercise prescription for aerobic and resistive training based on initial evaluation findings, risk stratification, comorbidities and participant's personal goals.       Expected Outcomes Short Term: Attend rehab on a regular basis to increase amount of physical activity.;Long Term: Exercising regularly at least 3-5 days a week.;Long Term: Add in home exercise to make exercise part of routine and to increase amount of physical activity.       Increase Strength and Stamina Yes  Intervention Develop an individualized exercise prescription for aerobic and resistive training based on initial evaluation findings, risk stratification, comorbidities and participant's personal goals.;Provide advice, education, support and counseling about physical  activity/exercise needs.       Expected Outcomes Short Term: Increase workloads from initial exercise prescription for resistance, speed, and METs.;Short Term: Perform resistance training exercises routinely during rehab and add in resistance training at home;Long Term: Improve cardiorespiratory fitness, muscular endurance and strength as measured by increased METs and functional capacity ( )       Able to understand and use rate of perceived exertion (RPE) scale Yes       Intervention Provide education and explanation on how to use RPE scale       Expected Outcomes Short Term: Able to use RPE daily in rehab to express subjective intensity level;Long Term:  Able to use RPE to guide intensity level when exercising independently       Able to understand and use Dyspnea scale Yes       Intervention Provide education and explanation on how to use Dyspnea scale       Expected Outcomes Short Term: Able to use Dyspnea scale daily in rehab to express subjective sense of shortness of breath during exertion;Long Term: Able to use Dyspnea scale to guide intensity level when exercising independently       Knowledge and understanding of Target Heart Rate Range (THRR) Yes       Intervention Provide education and explanation of THRR including how the numbers were predicted and where they are located for reference       Expected Outcomes Short Term: Able to state/look up THRR;Long Term: Able to use THRR to govern intensity when exercising independently;Short Term: Able to use daily as guideline for intensity in rehab       Able to check pulse independently Yes       Intervention Provide education and demonstration on how to check pulse in carotid and radial arteries.;Review the importance of being able to check your own pulse for safety during independent exercise       Expected Outcomes Short Term: Able to explain why pulse checking is important during independent exercise;Long Term: Able to check pulse  independently and accurately       Understanding of Exercise Prescription Yes       Intervention Provide education, explanation, and written materials on patient's individual exercise prescription       Expected Outcomes Short Term: Able to explain program exercise prescription;Long Term: Able to explain home exercise prescription to exercise independently          Exercise Goals Re-Evaluation :  Exercise Goals Re-Evaluation     Row Name 11/16/23 0945 11/21/23 1450 12/06/23 1350 12/12/23 1033 12/20/23 1621     Exercise Goal Re-Evaluation   Exercise Goals Review Able to understand and use rate of perceived exertion (RPE) scale;Knowledge and understanding of Target Heart Rate Range (THRR);Able to understand and use Dyspnea scale;Understanding of Exercise Prescription Increase Physical Activity;Understanding of Exercise Prescription;Increase Strength and Stamina Increase Physical Activity;Understanding of Exercise Prescription;Increase Strength and Stamina Increase Physical Activity;Able to understand and use rate of perceived exertion (RPE) scale;Knowledge and understanding of Target Heart Rate Range (THRR);Understanding of Exercise Prescription;Increase Strength and Stamina;Able to understand and use Dyspnea scale;Able to check pulse independently Increase Physical Activity;Understanding of Exercise Prescription;Increase Strength and Stamina   Comments eviewed RPE and dyspnea scale, THR and program prescription with pt today.  Pt voiced understanding and was given a  copy of goals to take home. Naavya is off to a good start in the program and completed her first day in this review. She tolerated her exercise prescription well with level 1 on the T4 nustep and level 1 on the recumbent bike. She used 2 lb weights for resistance. We will continue to monitor her progress in the program. Kerra is doing well in rehab. She was recently able to increase from level 1 to 2 on the T4 nustep. She was also able to  walk 32 laps on the track. We will continue to monitor her progress in the program. Reviewed home exercise with pt today from 9:25 to 9:35am.  Pt plans to walk for aerobic and use weights for resistance at home and keep up with her 41 year old great grandbaby for exercise. She plans to add 3 additional days at home. Reviewed THR, pulse, RPE, sign and symptoms, pulse oximetery and when to call 911 or MD.  Also discussed weather considerations and indoor options.  Pt voiced understanding. Sallye is doing well in rehab. She was able to use the arm ergometer at level 1. She worked at level 1 on the T4 nustep and walked 24 laps on the track. We will continue to monitor her progress in the program.   Expected Outcomes Short: Use RPE daily to regulate intensity. Long: Follow program prescription in THR. Short: Continue to follow current exercise prescription. Long: Continue exercise to improve strength and stamina. Short: Continue to follow current exercise prescription. Long: Continue exercise to improve strength and stamina. Short: Add 3 additional days of exercise at home. Long: Continue to exercise at home independently. Short: Try level 2 on the T4 nustep and push for more laps on the track. Long: Continue exercise to improve strength and stamina.    Row Name 01/02/24 1500 01/16/24 1524 01/31/24 1411 02/08/24 1004 02/15/24 1825     Exercise Goal Re-Evaluation   Exercise Goals Review Increase Physical Activity;Understanding of Exercise Prescription;Increase Strength and Stamina Increase Physical Activity;Understanding of Exercise Prescription;Increase Strength and Stamina Increase Physical Activity;Understanding of Exercise Prescription;Increase Strength and Stamina Increase Physical Activity;Increase Strength and Stamina;Understanding of Exercise Prescription Increase Physical Activity;Increase Strength and Stamina;Understanding of Exercise Prescription   Comments Jezel is doing well in rehab. She increased her  laps on the track to 30 and 52 laps. She maintained level 1 on the T4 nustep. We will continue to monitor her progress in the program. Taelynn continues to do well in rehab. She has continued to walk 52 laps on the track. She also has maintained her workload at level 1 on the T4 nustep. We will continue to monitor her progress in the program. Shelaine is doing well in the program. She continues to walk at least a mile on the track in 15 minutes. She was also able to increase from level 1 to 2 on the T4 nustep. We will continue to monitor her progress in the program. Takasha states she is going to increase her stamina by turning up her levels on her machines. She is doing well with walking the track and increasing her distance. Anicia is doing well in rehab. She increased to level 3 on the T4 nustep. She decreased her laps on the track slightly to 30 laps from 42. We will continue to monitor her progress in the program.   Expected Outcomes Short: Try level 2 on the T4 nustep. Long: Continue exercise to improve strength and stamina. Short: Increase to level 2 on the T4  nustep. Long: Continue exercise to improve strength and stamina. Short: Continue to increase level on T4 nustep. Long: Continue exercise to improve strength and stamina. Short: increase levels on all machines. Long: continue exercise independently. Short: Push for more laps on the track to get back to 42. Long: Continue exercise to improve strength and stamina.      Discharge Exercise Prescription (Final Exercise Prescription Changes):  Exercise Prescription Changes - 02/15/24 1800       Response to Exercise   Blood Pressure (Admit) 136/6    Blood Pressure (Exit) 130/68    Heart Rate (Admit) 93 bpm    Heart Rate (Exercise) 103 bpm    Heart Rate (Exit) 87 bpm    Oxygen Saturation (Admit) 89 %    Oxygen Saturation (Exercise) 86 %   came up to 88 with rest   Oxygen Saturation (Exit) 94 %    Rating of Perceived Exertion (Exercise) 13    Perceived  Dyspnea (Exercise) 3    Symptoms none    Duration Continue with 30 min of aerobic exercise without signs/symptoms of physical distress.    Intensity THRR unchanged      Progression   Progression Continue to progress workloads to maintain intensity without signs/symptoms of physical distress.    Average METs 1.95      Resistance Training   Training Prescription Yes    Weight 2 lb    Reps 10-15      Interval Training   Interval Training No      Oxygen   Oxygen Continuous    Liters 3-4      NuStep   Level 3    Minutes 15    METs 1.6      Track   Laps 30    Minutes 15    METs 2.63      Home Exercise Plan   Plans to continue exercise at Home (comment)   walking, resistance w/ weights, keeping up with 75 year old grandbaby   Frequency Add 3 additional days to program exercise sessions.    Initial Home Exercises Provided 12/12/23      Oxygen   Maintain Oxygen Saturation 88% or higher          Nutrition:  Target Goals: Understanding of nutrition guidelines, daily intake of sodium 1500mg , cholesterol 200mg , calories 30% from fat and 7% or less from saturated fats, daily to have 5 or more servings of fruits and vegetables.  Education: Nutrition 1 -Group instruction provided by verbal, written material, interactive activities, discussions, models, and posters to present general guidelines for heart healthy nutrition including macronutrients, label reading, and promoting whole foods over processed counterparts. Education serves as Pensions consultant of discussion of heart healthy eating for all. Written material provided at class time.     Education: Nutrition 2 -Group instruction provided by verbal, written material, interactive activities, discussions, models, and posters to present general guidelines for heart healthy nutrition including sodium, cholesterol, and saturated fat. Providing guidance of habit forming to improve blood pressure, cholesterol, and body weight. Written  material provided at class time.     Biometrics:  Pre Biometrics - 11/09/23 1124       Pre Biometrics   Height 5' 2 (1.575 m)    Weight 100 lb 11.2 oz (45.7 kg)    Waist Circumference 26.5 inches    Hip Circumference 33.5 inches    Waist to Hip Ratio 0.79 %    BMI (Calculated) 18.41    Single Leg  Stand 1.6 seconds           Nutrition Therapy Plan and Nutrition Goals:  Nutrition Therapy & Goals - 11/09/23 1133       Nutrition Therapy   RD appointment deferred Yes      Intervention Plan   Intervention Prescribe, educate and counsel regarding individualized specific dietary modifications aiming towards targeted core components such as weight, hypertension, lipid management, diabetes, heart failure and other comorbidities.    Expected Outcomes Short Term Goal: Understand basic principles of dietary content, such as calories, fat, sodium, cholesterol and nutrients.;Short Term Goal: A plan has been developed with personal nutrition goals set during dietitian appointment.;Long Term Goal: Adherence to prescribed nutrition plan.          Nutrition Assessments:  MEDIFICTS Score Key: >=70 Need to make dietary changes  40-70 Heart Healthy Diet <= 40 Therapeutic Level Cholesterol Diet  Flowsheet Row Pulmonary Rehab from 11/16/2023 in [facility] Card[facility] and Pulmonary Rehab  Picture Your Plate Total Score on Admission 53   Picture Your Plate Scores: <59 Unhealthy dietary pattern with much room for improvement. 41-50 Dietary pattern unlikely to meet recommendations for good health and room for improvement. 51-60 More healthful dietary pattern, with some room for improvement.  >60 Healthy dietary pattern, although there may be some specific behaviors that could be improved.   Nutrition Goals Re-Evaluation:  Nutrition Goals Re-Evaluation     Row Name 12/14/23 [phone] 01/17/24 0928 02/08/24 1008         Goals   Comment Patient was informed on why it is important to maintain a  balanced diet when dealing with Respiratory issues. Explained that it takes a lot of energy to breath and when they are short of breath often they will need to have a good diet to help keep up with the calories they are expending for breathing. Patient deferred RD appointment. Patient deferred RD appointment.     Expected Outcome Short: Choose and plan snacks accordingly to patients caloric intake to improve breathing. Long: Maintain a diet independently that meets their caloric intake to aid in daily shortness of breath. -- --        Nutrition Goals Discharge (Final Nutrition Goals Re-Evaluation):  Nutrition Goals Re-Evaluation - 02/08/24 1008       Goals   Comment Patient deferred RD appointment.          Psychosocial: Target Goals: Acknowledge presence or absence of significant depression and/or stress, maximize coping skills, provide positive support system. Participant is able to verbalize types and ability to use techniques and skills needed for reducing stress and depression.   Education: Stress, Anxiety, and Depression - Group verbal and visual presentation to define topics covered.  Reviews how body is impacted by stress, anxiety, and depression.  Also discusses healthy ways to reduce stress and to treat/manage anxiety and depression.  Written material provided at class time.   Education: Sleep Hygiene -Provides group verbal and written instruction about how sleep can affect your health.  Define sleep hygiene, discuss sleep cycles and impact of sleep habits. Review good sleep hygiene tips.    Initial Review & Psychosocial Screening:  Initial Psych Review & Screening - 11/08/23 1430       Initial Review   Current issues with Current Psychotropic Meds      Family Dynamics   Good Support System? Yes    Comments Suha sometimes gets depressed because she cannot breatj at times. She is unable to things that she wants  to due to her COPD and she takes Zoloft  to help. She can  look to her husband and grandchildren for support.      Barriers   Psychosocial barriers to participate in program The patient should benefit from training in stress management and relaxation.;Psychosocial barriers identified (see note)      Screening Interventions   Interventions Encouraged to exercise;Provide feedback about the scores to participant;To provide support and resources with identified psychosocial needs    Expected Outcomes Short Term goal: Utilizing psychosocial counselor, staff and physician to assist with identification of specific Stressors or current issues interfering with healing process. Setting desired goal for each stressor or current issue identified.;Long Term Goal: Stressors or current issues are controlled or eliminated.;Short Term goal: Identification and review with participant of any Quality of Life or Depression concerns found by scoring the questionnaire.;Long Term goal: The participant improves quality of Life and PHQ9 Scores as seen by post scores and/or verbalization of changes          Quality of Life Scores:  Scores of 19 and below usually indicate a poorer quality of life in these areas.  A difference of  2-3 points is a clinically meaningful difference.  A difference of 2-3 points in the total score of the Quality of Life Index has been associated with significant improvement in overall quality of life, self-image, physical symptoms, and general health in studies assessing change in quality of life.  PHQ-9: Review Flowsheet       02/08/2024 01/17/2024 11/09/2023  Depression screen PHQ 2/9  Decreased Interest 0 0 0  Down, Depressed, Hopeless 0 0 1  PHQ - 2 Score 0 0 1  Altered sleeping 0 0 3  Tired, decreased energy 1 3 3   Change in appetite 0 0 2  Feeling bad or failure about yourself  0 0 3  Trouble concentrating 0 1 3  Moving slowly or fidgety/restless 0 0 3  Suicidal thoughts 0 0 2  PHQ-9 Score 1 4 20   Difficult doing work/chores Not difficult  at all Not difficult at all Extremely dIfficult   Interpretation of Total Score  Total Score Depression Severity:  1-4 = Minimal depression, 5-9 = Mild depression, 10-14 = Moderate depression, 15-19 = Moderately severe depression, 20-27 = Severe depression   Psychosocial Evaluation and Intervention:  Psychosocial Evaluation - 11/08/23 1433       Psychosocial Evaluation & Interventions   Interventions Encouraged to exercise with the program and follow exercise prescription;Relaxation education;Stress management education    Comments [place] sometimes gets depressed because she cannot breatj at times. She is unable to things that she wants to due to her COPD and she takes Zoloft  to help. She can look to her husband and grandchildren for support.    Expected Outcomes Short: Start [facility] to help with mood. Long: Maintain a healthy mental state    Continue Psychosocial Services  Follow up required by staff          Psychosocial Re-Evaluation:  Psychosocial Re-Evaluation     Row Name 01/17/24 [phone] 02/08/24 1007           Psychosocial Re-Evaluation   Current issues with Current Psychotropic Meds None Identified      Comments Reviewed patient health questionnaire (PHQ-9) with patient for follow up. Previously, patients score indicated signs/symptoms of depression.  Reviewed to see if patient is improving symptom wise while in program.  Score improved and patient states that it is because she is feeling better and  able to exercise. Reviewed patient health questionnaire (PHQ-9) with patient for follow up. Previously, patients score indicated signs/symptoms of depression.  Reviewed to see if patient is improving symptom wise while in program.  Score improved and patient states that it is because she has been feeling better physically and is improving her mental state.      Expected Outcomes Short: Continue to attend [facility] regularly for regular exercise and social engagement. Long: Continue  to improve symptoms and manage a positive mental state. Short: Continue to attend [facility] regularly for regular exercise and social engagement. Long: Continue to improve symptoms and manage a positive mental state.      Interventions Encouraged to attend Pulmonary Rehabilitation for the exercise Encouraged to attend Pulmonary Rehabilitation for the exercise      Continue Psychosocial Services  Follow up required by staff Follow up required by staff         Psychosocial Discharge (Final Psychosocial Re-Evaluation):  Psychosocial Re-Evaluation - 02/08/24 1007       Psychosocial Re-Evaluation   Current issues with None Identified    Comments Reviewed patient health questionnaire (PHQ-9) with patient for follow up. Previously, patients score indicated signs/symptoms of depression.  Reviewed to see if patient is improving symptom wise while in program.  Score improved and patient states that it is because she has been feeling better physically and is improving her mental state.    Expected Outcomes Short: Continue to attend [facility] regularly for regular exercise and social engagement. Long: Continue to improve symptoms and manage a positive mental state.    Interventions Encouraged to attend Pulmonary Rehabilitation for the exercise    Continue Psychosocial Services  Follow up required by staff          Education: Education Goals: Education classes will be provided on a weekly basis, covering required topics. Participant will state understanding/return demonstration of topics presented.  Learning Barriers/Preferences:  Learning Barriers/Preferences - 11/08/23 1430       Learning Barriers/Preferences   Learning Barriers None    Learning Preferences None          General Pulmonary Education Topics:  Infection Prevention: - Provides verbal and written material to individual with discussion of infection control including proper hand washing and proper equipment cleaning during  exercise session. Flowsheet Row Pulmonary Rehab from 11/08/2023 in [facility] Cardiac and Pulmonary Rehab  Date 11/08/23  Educator [facility]  Instruction Review Code 1- Verbalizes Understanding    Falls Prevention: - Provides verbal and written material to individual with discussion of falls prevention and safety. Flowsheet Row Pulmonary Rehab from 11/08/2023 in [facility] Cardiac and Pulmonary Rehab  Date 11/08/23  Educator [facility]  Instruction Review Code 1- Verbalizes Understanding    Chronic Lung Disease Review: - Group verbal instruction with posters, models, PowerPoint presentations and videos,  to review new updates, new respiratory medications, new advancements in procedures and treatments. Providing information on websites and 800 numbers for continued self-education. Includes information about supplement oxygen, available portable oxygen systems, continuous and intermittent flow rates, oxygen safety, concentrators, and Medicare reimbursement for oxygen. Explanation of Pulmonary Drugs, including class, frequency, complications, importance of spacers, rinsing mouth after steroid MDI's, and proper cleaning methods for nebulizers. Review of basic lung anatomy and physiology related to function, structure, and complications of lung disease. Review of risk factors. Discussion about methods for diagnosing sleep apnea and types of masks and machines for OSA. Includes a review of the use of types of environmental controls: home humidity, furnaces, filters, dust  mite/pet prevention, HEPA vacuums. Discussion about weather changes, air quality and the benefits of nasal washing. Instruction on Warning signs, infection symptoms, calling MD promptly, preventive modes, and value of vaccinations. Review of effective airway clearance, coughing and/or vibration techniques. Emphasizing that all should Create an Action Plan. Written material provided at class time.   AED/CPR: - Group verbal and written instruction with the use of  models to demonstrate the basic use of the AED with the basic ABC's of resuscitation.    Tests and Procedures:  - Group verbal and visual presentation and models provide information about basic cardiac anatomy and function. Reviews the testing methods done to diagnose heart disease and the outcomes of the test results. Describes the treatment choices: Medical Management, Angioplasty, or Coronary Bypass Surgery for treating various heart conditions including Myocardial Infarction, Angina, Valve Disease, and Cardiac Arrhythmias.  Written material provided at class time.   Medication Safety: - Group verbal and visual instruction to review commonly prescribed medications for heart and lung disease. Reviews the medication, class of the drug, and side effects. Includes the steps to properly store meds and maintain the prescription regimen.  Written material given at graduation.   Other: -Provides group and verbal instruction on various topics (see comments)   Knowledge Questionnaire Score:  Knowledge Questionnaire Score - 11/16/23 0936       Knowledge Questionnaire Score   Pre Score 12/18           Core Components/Risk Factors/Patient Goals at Admission:  Personal Goals and Risk Factors at Admission - 11/08/23 1429       Core Components/Risk Factors/Patient Goals on Admission    Weight Management Yes;Weight Gain    Intervention Weight Management: Develop a combined nutrition and exercise program designed to reach desired caloric intake, while maintaining appropriate intake of nutrient and fiber, sodium and fats, and appropriate energy expenditure required for the weight goal.;Weight Management: Provide education and appropriate resources to help participant work on and attain dietary goals.;Obesity: Provide education and appropriate resources to help participant work on and attain dietary goals.    Expected Outcomes Short Term: Continue to assess and modify interventions until short term  weight is achieved;Weight Loss: Understanding of general recommendations for a balanced deficit meal plan, which promotes 1-2 lb weight loss per week and includes a negative energy balance of [phone] kcal/d;Understanding recommendations for meals to include 15-35% energy as protein, 25-35% energy from fat, 35-60% energy from carbohydrates, less than 200mg  of dietary cholesterol, 20-35 gm of total fiber daily;Understanding of distribution of calorie intake throughout the day with the consumption of 4-5 meals/snacks;Weight Gain: Understanding of general recommendations for a high calorie, high protein meal plan that promotes weight gain by distributing calorie intake throughout the day with the consumption for 4-5 meals, snacks, and/or supplements    Improve shortness of breath with ADL's Yes    Intervention Provide education, individualized exercise plan and daily activity instruction to help decrease symptoms of SOB with activities of daily living.    Expected Outcomes Short Term: Improve cardiorespiratory fitness to achieve a reduction of symptoms when performing ADLs;Long Term: Be able to perform more ADLs without symptoms or delay the onset of symptoms    Hypertension Yes    Intervention Provide education on lifestyle modifcations including regular physical activity/exercise, weight management, moderate sodium restriction and increased consumption of fresh fruit, vegetables, and low fat dairy, alcohol moderation, and smoking cessation.;Monitor prescription use compliance.    Expected Outcomes Short Term: Continued assessment and intervention  until BP is < 140/46mm HG in hypertensive participants. < 130/81mm HG in hypertensive participants with diabetes, heart failure or chronic kidney disease.;Long Term: Maintenance of blood pressure at goal levels.    Lipids Yes    Intervention Provide education and support for participant on nutrition & aerobic/resistive exercise along with prescribed medications to  achieve LDL 70mg , HDL >40mg .    Expected Outcomes Short Term: Participant states understanding of desired cholesterol values and is compliant with medications prescribed. Participant is following exercise prescription and nutrition guidelines.;Long Term: Cholesterol controlled with medications as prescribed, with individualized exercise RX and with personalized nutrition plan. Value goals: LDL < 70mg , HDL > 40 mg.          Education:Diabetes - Individual verbal and written instruction to review signs/symptoms of diabetes, desired ranges of glucose level fasting, after meals and with exercise. Acknowledge that pre and post exercise glucose checks will be done for 3 sessions at entry of program.   Know Your Numbers and Heart Failure: - Group verbal and visual instruction to discuss disease risk factors for cardiac and pulmonary disease and treatment options.  Reviews associated critical values for Overweight/Obesity, Hypertension, Cholesterol, and Diabetes.  Discusses basics of heart failure: signs/symptoms and treatments.  Introduces Heart Failure Zone chart for action plan for heart failure. Written material provided at class time.   Core Components/Risk Factors/Patient Goals Review:   Goals and Risk Factor Review     Row Name 12/14/23 0951 01/17/24 0931 02/08/24 1008         Core Components/Risk Factors/Patient Goals Review   Personal Goals Review Improve shortness of breath with ADL's Other Other     Review Spoke to patient about their shortness of breath and what they can do to improve. Patient has been informed of breathing techniques when starting the program. Patient is informed to tell staff if they have had any med changes and that certain meds they are taking or not taking can be causing shortness of breath. Diaphragmatic and PLB breathing explained and performed with patient. Patient has a better understanding of how to do these exercises to help with breathing performance and  relaxation. Patient performed breathing techniques adequately and to practice further at home. Marikay states that she has no questions about her medications or vital signs. She has been doing well in the program and will follow up with patient over the next few weeks.     Expected Outcomes Short: Attend [facility] regularly to improve shortness of breath with ADL's. Long: maintain independence with ADL's Short: practice PLB and diaphragmatic breathing at home. Long: Use PLB and diaphragmatic breathing independently post [facility]. Short: continue to attend [facility]. Long: maintain exercise independently.        Core Components/Risk Factors/Patient Goals at Discharge (Final Review):   Goals and Risk Factor Review - 02/08/24 1008       Core Components/Risk Factors/Patient Goals Review   Personal Goals Review Other    Review Paulla states that she has no questions about her medications or vital signs. She has been doing well in the program and will follow up with patient over the next few weeks.    Expected Outcomes Short: continue to attend [facility]. Long: maintain exercise independently.          ITP Comments:  ITP Comments     Row Name 11/08/23 1433 11/09/23 1110 11/16/23 0944 01/03/24 0843 01/31/24 0924   ITP Comments Virtual Visit completed. Patient informed on EP and RD appointment and 6 Minute  walk test. Patient also informed of patient health questionnaires on My Chart. Patient Verbalizes understanding. Visit diagnosis can be found in [facility] 10/04/2023. Completed and gym orientation for respiratory care services. Initial ITP created and sent for review to Dr. Faud Aleskerov, Medical Director. First full day of exercise!  Patient was oriented to gym and equipment including functions, settings, policies, and procedures.  Patient's individual exercise prescription and treatment plan were reviewed.  All starting workloads were established based on the results of the 6 minute walk test done at  initial orientation visit.  The plan for exercise progression was also introduced and progression will be customized based on patient's performance and goals. 30 Day review completed. Medical Director ITP review done, changes made as directed, and signed approval by Medical Director. new to program. 30 Day review completed. Medical Director ITP review done; changes made as directed and signed approval by Medical Director.    Row Name 02/28/24 1356           ITP Comments 30 Day review completed. Medical Director ITP review done; changes made as directed and signed approval by Medical Director.          Comments: 30 day review

## 2024-02-28 NOTE — Progress Notes (Signed)
 Daily Session Note  Patient Details  Name: Taylor Chambers MRN: [id number] Date of Birth: 1945/08/02 Referring Provider:   Conrad Ports Pulmonary Rehab from 11/09/2023 in [facility] Cardiac and Pulmonary Rehab  Referring Provider Dr. Halina Picking, MD    Encounter Date: 02/28/2024  Check In:  Session Check In - 02/28/24 0929       Check-In   Supervising physician immediately available to respond to emergencies See telemetry face sheet for immediately available ER MD    Location [facility]-Cardiac & Pulmonary Rehab    Staff Present Burnard Davenport [facility] Peggi, RN, DNP, NE-BC;Maxon Conetta BS, Exercise Physiologist;Joseph [employer], MS, Exercise Physiologist;Jason Elnor RDN,LDN    Virtual Visit No    Medication changes reported     No    Fall or balance concerns reported    No    Tobacco Cessation No Change    Warm-up and Cool-down Performed on first and last piece of equipment    Resistance Training Performed Yes    VAD Patient? No    PAD/SET Patient? No      Pain Assessment   Currently in Pain? No/denies             Social History   Tobacco Use  Smoking Status Former   Current packs/day: 0.00   Average packs/day: 0.3 packs/day for 50.0 years (12.5 ttl pk-yrs)   Types: Cigarettes   Start date: 01/10/1972   Quit date: 01/09/2022   Years since quitting: 2.1  Smokeless Tobacco Never    Goals Met:  Proper associated with RPD/PD & O2 Sat Independence with exercise equipment Using PLB without cueing & demonstrates good technique Exercise tolerated well No report of concerns or symptoms today Strength training completed today  Goals Unmet:  Not Applicable  Comments: Pt able to follow exercise prescription today without complaint.  Will continue to monitor for progression.    Dr. Oneil Pinal is Medical Director for [facility] Cardiac Rehabilitation.  Dr. Fuad Aleskerov is Medical Director for [facility] Pulmonary Rehabilitation.

## 2024-02-29 ENCOUNTER — Encounter: Admitting: Emergency Medicine

## 2024-02-29 DIAGNOSIS — J449 Chronic obstructive pulmonary disease, unspecified: Secondary | ICD-10-CM

## 2024-02-29 NOTE — Progress Notes (Signed)
 Daily Session Note  Patient Details  Name: Taylor Chambers MRN: [id number] Date of Birth: 12-23-1945 Referring Provider:   Conrad Ports Pulmonary Rehab from 11/09/2023 in [facility] Cardiac and Pulmonary Rehab  Referring Provider Dr. Halina Picking, MD    Encounter Date: 02/29/2024  Check In:  Session Check In - 02/29/24 0928       Check-In   Supervising physician immediately available to respond to emergencies See telemetry face sheet for immediately available ER MD    Location [facility]-Cardiac & Pulmonary Rehab    Staff Present Selinda Pereyra RDN,LDN;Destani Wamser RN,BSN;Joseph Rolinda RCP,RRT,BSRT;Maxon Conetta BS, Exercise Physiologist    Virtual Visit No    Medication changes reported     No    Fall or balance concerns reported    No    Tobacco Cessation No Change    Warm-up and Cool-down Performed on first and last piece of equipment    Resistance Training Performed Yes    VAD Patient? No    PAD/SET Patient? No      Pain Assessment   Currently in Pain? No/denies             Social History   Tobacco Use  Smoking Status Former   Current packs/day: 0.00   Average packs/day: 0.3 packs/day for 50.0 years (12.5 ttl pk-yrs)   Types: Cigarettes   Start date: 01/10/1972   Quit date: 01/09/2022   Years since quitting: 2.1  Smokeless Tobacco Never    Goals Met:  Proper associated with RPD/PD & O2 Sat Independence with exercise equipment Using PLB without cueing & demonstrates good technique Exercise tolerated well No report of concerns or symptoms today Strength training completed today  Goals Unmet:  Not Applicable  Comments: Pt able to follow exercise prescription today without complaint.  Will continue to monitor for progression.    Dr. Oneil Pinal is Medical Director for [facility] Cardiac Rehabilitation.  Dr. Fuad Aleskerov is Medical Director for [facility] Pulmonary Rehabilitation.

## 2024-03-05 ENCOUNTER — Encounter

## 2024-03-06 ENCOUNTER — Encounter: Attending: Pulmonary Disease

## 2024-03-06 DIAGNOSIS — J449 Chronic obstructive pulmonary disease, unspecified: Secondary | ICD-10-CM | POA: Insufficient documentation

## 2024-03-06 NOTE — Progress Notes (Signed)
 Daily Session Note  Patient Details  Name: Taylor Chambers MRN: [id number] Date of Birth: [birth year] Referring Provider:   Conrad Ports Pulmonary Rehab from 11/09/2023 in [facility] Cardiac and Pulmonary Rehab  Referring Provider Dr. Halina Picking, MD    Encounter Date: 03/06/2024  Check In:  Session Check In - 03/06/24 0923       Check-In   Supervising physician immediately available to respond to emergencies See telemetry face sheet for immediately available ER MD    Location [facility]-Cardiac & Pulmonary Rehab    Staff Present Burnard Davenport [facility] Peggi, RN, DNP, NE-BC;Joseph [facility] RN,BSN;Margaret Best, MS, Exercise Physiologist    Virtual Visit No    Medication changes reported     No    Fall or balance concerns reported    No    Tobacco Cessation No Change    Warm-up and Cool-down Performed on first and last piece of equipment    Resistance Training Performed Yes    VAD Patient? No    PAD/SET Patient? No      Pain Assessment   Currently in Pain? No/denies             Social History   Tobacco Use  Smoking Status Former   Current packs/day: 0.00   Average packs/day: 0.3 packs/day for 50.0 years (12.5 ttl pk-yrs)   Types: Cigarettes   Start date: 01/10/1972   Quit date: 01/09/2022   Years since quitting: 2.1  Smokeless Tobacco Never    Goals Met:  Proper associated with RPD/PD & O2 Sat Independence with exercise equipment Using PLB without cueing & demonstrates good technique Exercise tolerated well No report of concerns or symptoms today Strength training completed today  Goals Unmet:  Not Applicable  Comments: Pt able to follow exercise prescription today without complaint.  Will continue to monitor for progression.    Dr. Oneil Pinal is Medical Director for [facility] Cardiac Rehabilitation.  Dr. Fuad Aleskerov is Medical Director for [facility] Pulmonary Rehabilitation.

## 2024-03-07 ENCOUNTER — Encounter: Admitting: Emergency Medicine

## 2024-03-07 DIAGNOSIS — J449 Chronic obstructive pulmonary disease, unspecified: Secondary | ICD-10-CM | POA: Diagnosis not present

## 2024-03-07 NOTE — Progress Notes (Signed)
 Daily Session Note  Patient Details  Name: Taylor Chambers MRN: [id number] Date of Birth: May 01, 1946 Referring Provider:   Conrad Ports Pulmonary Rehab from 11/09/2023 in [facility] Cardiac and Pulmonary Rehab  Referring Provider Dr. Halina Picking, MD    Encounter Date: 03/07/2024  Check In:  Session Check In - 03/07/24 0941       Check-In   Supervising physician immediately available to respond to emergencies See telemetry face sheet for immediately available ER MD    Location [facility]-Cardiac & Pulmonary Rehab    Staff Present Rollene Paterson, MS, Exercise Physiologist;Joseph Rolinda RCP,RRT,BSRT;Loron Weimer RN,BSN;Maxon Pluckemin BS, Exercise Physiologist    Virtual Visit No    Medication changes reported     No    Fall or balance concerns reported    No    Tobacco Cessation No Change    Warm-up and Cool-down Performed on first and last piece of equipment    Resistance Training Performed Yes    VAD Patient? No    PAD/SET Patient? No      Pain Assessment   Currently in Pain? No/denies             Social History   Tobacco Use  Smoking Status Former   Current packs/day: 0.00   Average packs/day: 0.3 packs/day for 50.0 years (12.5 ttl pk-yrs)   Types: Cigarettes   Start date: 01/10/1972   Quit date: 01/09/2022   Years since quitting: 2.1  Smokeless Tobacco Never    Goals Met:  Proper associated with RPD/PD & O2 Sat Independence with exercise equipment Using PLB without cueing & demonstrates good technique Exercise tolerated well No report of concerns or symptoms today Strength training completed today  Goals Unmet:  Not Applicable  Comments: Pt able to follow exercise prescription today without complaint.  Will continue to monitor for progression.    Dr. Oneil Pinal is Medical Director for [facility] Cardiac Rehabilitation.  Dr. Fuad Aleskerov is Medical Director for [facility] Pulmonary Rehabilitation.

## 2024-03-13 ENCOUNTER — Encounter

## 2024-03-13 VITALS — Ht 62.0 in | Wt 99.4 lb

## 2024-03-13 DIAGNOSIS — J449 Chronic obstructive pulmonary disease, unspecified: Secondary | ICD-10-CM

## 2024-03-13 NOTE — Progress Notes (Signed)
 Daily Session Note  Patient Details  Name: Taylor Chambers MRN: [id number] Date of Birth: 17-May-1946 Referring Provider:   Conrad Ports Pulmonary Rehab from 11/09/2023 in [facility] Cardiac and Pulmonary Rehab  Referring Provider Dr. Halina Picking, MD    Encounter Date: 03/13/2024  Check In:  Session Check In - 03/13/24 0947       Check-In   Supervising physician immediately available to respond to emergencies See telemetry face sheet for immediately available ER MD    Location [facility]-Cardiac & Pulmonary Rehab    Staff Present Burnard Davenport [facility] Peggi, RN, DNP, NE-BC;Joseph [facility] RN,BSN;Margaret Best, MS, Exercise Physiologist;Samya Siciliano Dyane BS, ACSM CEP, Exercise Physiologist    Virtual Visit No    Medication changes reported     No    Fall or balance concerns reported    No    Tobacco Cessation No Change    Warm-up and Cool-down Performed on first and last piece of equipment    Resistance Training Performed Yes    VAD Patient? No    PAD/SET Patient? No      Pain Assessment   Currently in Pain? No/denies             Social History   Tobacco Use  Smoking Status Former   Current packs/day: 0.00   Average packs/day: 0.3 packs/day for 50.0 years (12.5 ttl pk-yrs)   Types: Cigarettes   Start date: 01/10/1972   Quit date: 01/09/2022   Years since quitting: 2.1  Smokeless Tobacco Never    Goals Met:  Proper associated with RPD/PD & O2 Sat Independence with exercise equipment Using PLB without cueing & demonstrates good technique Exercise tolerated well No report of concerns or symptoms today Strength training completed today  Goals Unmet:  Not Applicable  Comments: Pt able to follow exercise prescription today without complaint.  Will continue to monitor for progression.    Dr. Oneil Pinal is Medical Director for [facility] Cardiac Rehabilitation.  Dr. Fuad Aleskerov is Medical Director for [facility] Pulmonary Rehabilitation.

## 2024-03-13 NOTE — Patient Instructions (Signed)
 Discharge Patient Instructions  Patient Details  Name: Taylor Chambers MRN: [id number] Date of Birth: [birth year] Referring Provider:  Epifanio Alm SQUIBB, MD   Number of Visits: 36  Reason for Discharge:  Patient reached a stable level of exercise. Patient independent in their exercise. Patient has met program and personal goals.  Smoking History:  Social History   Tobacco Use  Smoking Status Former   Current packs/day: 0.00   Average packs/day: 0.3 packs/day for 50.0 years (12.5 ttl pk-yrs)   Types: Cigarettes   Start date: 01/10/1972   Quit date: 01/09/2022   Years since quitting: 2.1  Smokeless Tobacco Never    Diagnosis:  Chronic obstructive pulmonary disease, unspecified COPD type (HCC)  Initial Exercise Prescription:  Initial Exercise Prescription - 11/09/23 1100       Date of Initial Exercise RX and Referring Provider   Date 11/09/23    Referring Provider Dr. Fuad Aleskerov, MD      Oxygen   Oxygen Continuous    Liters 3    Maintain Oxygen Saturation 88% or higher      Recumbant Bike   Level 1    RPM 50    Watts 15    Minutes 15    METs 1.68      NuStep   Level 1    SPM 80    Minutes 15    METs 1.68      Track   Laps 12    Minutes 15    METs 1.65      Prescription Details   Frequency (times per week) 2    Duration Progress to 30 minutes of continuous aerobic without signs/symptoms of physical distress      Intensity   THRR 40-80% of Max Heartrate 108-132    Ratings of Perceived Exertion 11-13    Perceived Dyspnea 0-4      Progression   Progression Continue to progress workloads to maintain intensity without signs/symptoms of physical distress.      Resistance Training   Training Prescription Yes    Weight 2 lb    Reps 10-15          Discharge Exercise Prescription (Final Exercise Prescription Changes):  Exercise Prescription Changes - 03/11/24 1100       Response to Exercise   Blood Pressure (Admit) 138/64    Blood Pressure  (Exit) 118/56    Heart Rate (Admit) 74 bpm    Heart Rate (Exercise) 97 bpm    Heart Rate (Exit) 77 bpm    Oxygen Saturation (Admit) 90 %    Oxygen Saturation (Exercise) 87 %    Oxygen Saturation (Exit) 98 %    Rating of Perceived Exertion (Exercise) 14    Perceived Dyspnea (Exercise) 3    Symptoms none    Duration Continue with 30 min of aerobic exercise without signs/symptoms of physical distress.    Intensity THRR unchanged      Progression   Progression Continue to progress workloads to maintain intensity without signs/symptoms of physical distress.    Average METs 1.8      Resistance Training   Training Prescription Yes    Weight 2 lb    Reps 10-15      Interval Training   Interval Training No      Oxygen   Oxygen Continuous    Liters 3      NuStep   Level 3    Minutes 15    METs 1.5  Track   Laps 35    Minutes 15    METs 2.9      Home Exercise Plan   Plans to continue exercise at Home (comment)   walking, resistance w/ weights, keeping up with 47 year old grandbaby   Frequency Add 3 additional days to program exercise sessions.    Initial Home Exercises Provided 12/12/23      Oxygen   Maintain Oxygen Saturation 88% or higher          Functional Capacity:  6 Minute Walk     Row Name 11/09/23 1117 03/13/24 0949       6 Minute Walk   Phase Initial Discharge    Distance 455 feet 900 feet    Distance % Change -- 97.8 %    Distance Feet Change -- 445 ft    Walk Time 6 minutes 6 minutes    # of Rest Breaks 0 0    MPH 0.86 1.7    METS 1.68 2.37    RPE 15 12    Perceived Dyspnea  4 1    VO2 Peak 5.88 8.31    Symptoms Yes (comment) Yes (comment)    Comments used walker used walker    Resting HR 85 bpm 77 bpm    Resting BP 132/68 158/74    Resting Oxygen Saturation  95 % 92 %    Exercise Oxygen Saturation  during 6 min walk 90 % 90 %    Max Ex. HR 105 bpm 92 bpm    Max Ex. BP 154/74 170/72    2 Minute Post BP 142/72 140/68      Interval HR    1 Minute HR 102 85    2 Minute HR 105 92    3 Minute HR 104 90    4 Minute HR 105 87    5 Minute HR 101 88    6 Minute HR 98 92    2 Minute Post HR 93 79    Interval Heart Rate? Yes Yes      Interval Oxygen   Interval Oxygen? Yes Yes    Baseline Oxygen Saturation % 95 % 92 %    1 Minute Oxygen Saturation % 91 % 91 %    1 Minute Liters of Oxygen 3 L  pulsed 3 L    2 Minute Oxygen Saturation % 92 % 92 %    2 Minute Liters of Oxygen 3 L 3 L    3 Minute Oxygen Saturation % 91 % 93 %    3 Minute Liters of Oxygen 3 L 3 L    4 Minute Oxygen Saturation % 90 % 90 %    4 Minute Liters of Oxygen 3 L 3 L    5 Minute Oxygen Saturation % 91 % 91 %    5 Minute Liters of Oxygen 3 L 3 L    6 Minute Oxygen Saturation % 91 % 90 %    6 Minute Liters of Oxygen 3 L 3 L    2 Minute Post Oxygen Saturation % 95 % 95 %    2 Minute Post Liters of Oxygen 3 L 3 L      Nutrition & Weight - Outcomes:  Pre Biometrics - 11/09/23 1124       Pre Biometrics   Height 5' 2 (1.575 m)    Weight 100 lb 11.2 oz (45.7 kg)    Waist Circumference 26.5 inches    Hip Circumference  33.5 inches    Waist to Hip Ratio 0.79 %    BMI (Calculated) 18.41    Single Leg Stand 1.6 seconds          Post Biometrics - 03/13/24 0954        Post  Biometrics   Height 5' 2 (1.575 m)    Weight 99 lb 6.4 oz (45.1 kg)    Waist Circumference 27.7 inches    Hip Circumference 35 inches    Waist to Hip Ratio 0.79 %    BMI (Calculated) 18.18    Single Leg Stand 2 seconds

## 2024-03-14 ENCOUNTER — Encounter: Admitting: Emergency Medicine

## 2024-03-14 DIAGNOSIS — J449 Chronic obstructive pulmonary disease, unspecified: Secondary | ICD-10-CM

## 2024-03-14 NOTE — Progress Notes (Signed)
 Daily Session Note  Patient Details  Name: Taylor Chambers MRN: [id number] Date of Birth: Sep 22, 1945 Referring Provider:   Conrad Ports Pulmonary Rehab from 11/09/2023 in [facility] Cardiac and Pulmonary Rehab  Referring Provider Dr. Halina Picking, MD    Encounter Date: 03/14/2024  Check In:  Session Check In - 03/14/24 0940       Check-In   Supervising physician immediately available to respond to emergencies See telemetry face sheet for immediately available ER MD    Location [facility]-Cardiac & Pulmonary Rehab    Staff Present Fairy Plater RCP,RRT,BSRT;Maxon Conetta BS, Exercise Physiologist;Chastin Riesgo RN,BSN;Margaret Best, MS, Exercise Physiologist;Jason Elnor RDN,LDN    Virtual Visit No    Medication changes reported     No    Fall or balance concerns reported    No    Tobacco Cessation No Change    Warm-up and Cool-down Performed on first and last piece of equipment    Resistance Training Performed Yes    VAD Patient? No    PAD/SET Patient? No      Pain Assessment   Currently in Pain? No/denies             Social History   Tobacco Use  Smoking Status Former   Current packs/day: 0.00   Average packs/day: 0.3 packs/day for 50.0 years (12.5 ttl pk-yrs)   Types: Cigarettes   Start date: 01/10/1972   Quit date: 01/09/2022   Years since quitting: 2.1  Smokeless Tobacco Never    Goals Met:  Proper associated with RPD/PD & O2 Sat Independence with exercise equipment Using PLB without cueing & demonstrates good technique Exercise tolerated well No report of concerns or symptoms today Strength training completed today  Goals Unmet:  Not Applicable  Comments: Pt able to follow exercise prescription today without complaint.  Will continue to monitor for progression.    Dr. Oneil Pinal is Medical Director for [facility] Cardiac Rehabilitation.  Dr. Fuad Aleskerov is Medical Director for [facility] Pulmonary Rehabilitation.

## 2024-03-20 ENCOUNTER — Encounter

## 2024-03-20 DIAGNOSIS — J449 Chronic obstructive pulmonary disease, unspecified: Secondary | ICD-10-CM

## 2024-03-20 NOTE — Progress Notes (Signed)
 Daily Session Note  Patient Details  Name: Taylor Chambers MRN: [id number] Date of Birth: 09/08/1945 Referring Provider:   [facility] from 11/09/2023 in [facility] Cardiac and Pulmonary Rehab  Referring Provider Dr. Halina Picking, MD    Encounter Date: 03/20/2024  Check In:  Session Check In - 03/20/24 0922       Check-In   Supervising physician immediately available to respond to emergencies See telemetry face sheet for immediately available ER MD    Location [facility]-Cardiac & Pulmonary Rehab    Staff Present Burnard Davenport RN,BSN,MPA;Joseph [facility] Dyane BS, ACSM CEP, Exercise Physiologist;Noah Tickle, BS, Exercise Physiologist    Virtual Visit No    Medication changes reported     No    Fall or balance concerns reported    No    Tobacco Cessation No Change    Warm-up and Cool-down Performed on first and last piece of equipment    Resistance Training Performed Yes    VAD Patient? No    PAD/SET Patient? No      Pain Assessment   Currently in Pain? No/denies             Social History   Tobacco Use  Smoking Status Former   Current packs/day: 0.00   Average packs/day: 0.3 packs/day for 50.0 years (12.5 ttl pk-yrs)   Types: Cigarettes   Start date: 01/10/1972   Quit date: 01/09/2022   Years since quitting: 2.1  Smokeless Tobacco Never    Goals Met:  Proper associated with RPD/PD & O2 Sat Independence with exercise equipment Using PLB without cueing & demonstrates good technique Exercise tolerated well No report of concerns or symptoms today Strength training completed today  Goals Unmet:  Not Applicable  Comments: Pt able to follow exercise prescription today without complaint.  Will continue to monitor for progression.    Dr. Oneil Pinal is Medical Director for [facility] Cardiac Rehabilitation.  Dr. Fuad Aleskerov is Medical Director for [facility] Pulmonary Rehabilitation.

## 2024-03-21 ENCOUNTER — Encounter

## 2024-03-21 DIAGNOSIS — J449 Chronic obstructive pulmonary disease, unspecified: Secondary | ICD-10-CM

## 2024-03-21 NOTE — Progress Notes (Signed)
 Daily Session Note  Patient Details  Name: Taylor Chambers MRN: [id number] Date of Birth: 09-04-1945 Referring Provider:   [facility] from 11/09/2023 in [facility] Card[facility] and Pulmonary Rehab  Referring Provider Dr. Halina Picking, MD    Encounter Date: 03/21/2024  Check In:  Session Check In - 03/21/24 0918       Check-In   Supervising physician immediately available to respond to emergencies See telemetry face sheet for immediately available ER MD    Location [facility]-Cardiac & Pulmonary Rehab    Staff Present Burnard Davenport RN,BSN,MPA;Joseph Hood RCP,RRT,BSRT;Maxon Burnell BS, Exercise Physiologist;Margaret Best, MS, Exercise Physiologist;Jason Elnor RDN,LDN    Virtual Visit No    Medication changes reported     No    Fall or balance concerns reported    No    Tobacco Cessation No Change    Warm-up and Cool-down Performed on first and last piece of equipment    Resistance Training Performed Yes    VAD Patient? No    PAD/SET Patient? No      Pain Assessment   Currently in Pain? No/denies             Social History   Tobacco Use  Smoking Status Former   Current packs/day: 0.00   Average packs/day: 0.3 packs/day for 50.0 years (12.5 ttl pk-yrs)   Types: Cigarettes   Start date: 01/10/1972   Quit date: 01/09/2022   Years since quitting: 2.1  Smokeless Tobacco Never    Goals Met:  Proper associated with RPD/PD & O2 Sat Independence with exercise equipment Using PLB without cueing & demonstrates good technique Exercise tolerated well No report of concerns or symptoms today Strength training completed today  Goals Unmet:  Not Applicable  Comments: Pt able to follow exercise prescription today without complaint.  Will continue to monitor for progression.    Dr. Oneil Pinal is Medical Director for [facility] Cardiac Rehabilitation.  Dr. Fuad Aleskerov is Medical Director for [facility] Pulmonary Rehabilitation.

## 2024-03-27 ENCOUNTER — Encounter: Payer: Self-pay | Admitting: *Deleted

## 2024-03-27 ENCOUNTER — Encounter: Admitting: Emergency Medicine

## 2024-03-27 DIAGNOSIS — J449 Chronic obstructive pulmonary disease, unspecified: Secondary | ICD-10-CM | POA: Diagnosis not present

## 2024-03-27 NOTE — Progress Notes (Signed)
 Daily Session Note  Patient Details  Name: Taylor Chambers MRN: [id number] Date of Birth: Sep 19, 1945 Referring Provider:   [facility] from 11/09/2023 in [facility] Cardiac and Pulmonary Rehab  Referring Provider Dr. Halina Picking, MD    Encounter Date: 03/27/2024  Check In:  Session Check In - 03/27/24 0943       Check-In   Supervising physician immediately available to respond to emergencies See telemetry face sheet for immediately available ER MD    Location [facility]-Cardiac & Pulmonary Rehab    Staff Present Burnard Hint BS, ACSM CEP, Exercise Physiologist;Noah Tickle, BS, Exercise Physiologist;Joseph Rolinda RCP,RRT,BSRT;Anishka Bushard RN,BSN    Virtual Visit No    Medication changes reported     No    Fall or balance concerns reported    No    Tobacco Cessation No Change    Warm-up and Cool-down Performed on first and last piece of equipment    Resistance Training Performed Yes    VAD Patient? No    PAD/SET Patient? No      Pain Assessment   Currently in Pain? No/denies             Social History   Tobacco Use  Smoking Status Former   Current packs/day: 0.00   Average packs/day: 0.3 packs/day for 50.0 years (12.5 ttl pk-yrs)   Types: Cigarettes   Start date: 01/10/1972   Quit date: 01/09/2022   Years since quitting: 2.2  Smokeless Tobacco Never    Goals Met:  Proper associated with RPD/PD & O2 Sat Independence with exercise equipment Using PLB without cueing & demonstrates good technique Exercise tolerated well No report of concerns or symptoms today Strength training completed today  Goals Unmet:  Not Applicable  Comments: Pt able to follow exercise prescription today without complaint.  Will continue to monitor for progression.    Dr. Oneil Pinal is Medical Director for [facility] Cardiac Rehabilitation.  Dr. Fuad Aleskerov is Medical Director for [facility] Pulmonary Rehabilitation.

## 2024-03-27 NOTE — Progress Notes (Signed)
 Pulmonary Individual Treatment Plan  Patient Details  Name: Taylor Chambers MRN: [id number] Date of Birth: 12/31/1945 Referring Provider:   Conrad Ports Pulmonary Rehab from 11/09/2023 in [facility] Cardiac and Pulmonary Rehab  Referring Provider Dr. Halina Picking, MD    Initial Encounter Date:  Flowsheet Row Pulmonary Rehab from 11/09/2023 in [facility] Cardiac and Pulmonary Rehab  Date 11/09/23    Visit Diagnosis: Chronic obstructive pulmonary disease, unspecified COPD type (HCC)  Patient's Home Medications on Admission:  Current Outpatient Medications:    albuterol  (VENTOLIN  HFA) 108 (90 Base) MCG/ACT inhaler, Inhale 2 puffs into the lungs every 6 (six) hours as needed for wheezing or shortness of breath., Disp: 8 g, Rfl: 2   apixaban  (ELIQUIS ) 5 MG TABS tablet, TAKE 1 TABLET BY MOUTH TWICE A DAY, Disp: 180 tablet, Rfl: 1   budesonide  (PULMICORT ) 0.25 MG/2ML nebulizer solution, SMARTSIG:2 Milliliter(s) Twice Daily (Patient not taking: Reported on 12/19/2023), Disp: , Rfl:    Calcium Carbonate (CALCIUM 500 PO), Take 500 mg by mouth daily at 12 noon. (Patient not taking: Reported on 12/19/2023), Disp: , Rfl:    Cholecalciferol  (VITAMIN D3) 1.25 MG (50000 UT) CAPS, Take 5,000 Units by mouth daily in the afternoon., Disp: , Rfl:    COMBIVENT  RESPIMAT 20-100 MCG/ACT AERS respimat, Inhale into the lungs. (Patient not taking: Reported on 12/19/2023), Disp: , Rfl:    cyclobenzaprine (FLEXERIL) 10 MG tablet, Take 10 mg by mouth 2 (two) times daily as needed for muscle spasms. (Patient not taking: Reported on 12/19/2023), Disp: , Rfl:    divalproex  (DEPAKOTE ) 250 MG DR tablet, Take 250 mg by mouth., Disp: , Rfl:    Dupilumab  300 MG/2ML SOAJ, Inject 300 mg into the skin every 14 (fourteen) days. Saturday (Patient not taking: Reported on 12/19/2023), Disp: , Rfl:    DUPIXENT  300 MG/2ML prefilled syringe, Inject 300 mg into the skin., Disp: , Rfl:    EPINEPHrine 0.3 mg/0.3 mL IJ SOAJ injection, Inject 0.3 mg into  the muscle as needed for anaphylaxis., Disp: , Rfl:    furosemide  (LASIX ) 40 MG tablet, Take 1 tablet (40 mg total) by mouth daily. (Patient not taking: Reported on 12/19/2023), Disp: 30 tablet, Rfl: 0   ipratropium (ATROVENT ) 0.06 % nasal spray, Place 1 spray into both nostrils daily., Disp: , Rfl:    ipratropium-albuterol  (DUONEB) 0.5-2.5 (3) MG/3ML SOLN, Take 3 mLs by nebulization 2 (two) times daily. Mix with budesonide  (Patient not taking: Reported on 12/19/2023), Disp: , Rfl:    losartan  (COZAAR ) 100 MG tablet, Take 1 tablet (100 mg total) by mouth daily., Disp: 90 tablet, Rfl: 3   metoprolol  succinate (TOPROL -XL) 25 MG 24 hr tablet, Take 1 tablet (25 mg total) by mouth 2 (two) times daily. (Patient not taking: Reported on 12/19/2023), Disp: 60 tablet, Rfl: 0   Multiple Vitamin (MULTI-VITAMIN) tablet, Take 1 tablet by mouth daily., Disp: , Rfl:    Multiple Vitamins-Minerals (PRESERVISION AREDS 2+MULTI VIT PO), Take 2 tablets by mouth daily. (Patient not taking: Reported on 12/19/2023), Disp: , Rfl:    OHTUVAYRE  3 MG/2.5ML SUSP, Take 3 mg by nebulization daily. (Patient not taking: Reported on 12/19/2023), Disp: , Rfl:    omeprazole (PRILOSEC) 40 MG capsule, Take 40 mg by mouth daily as needed (Heartburn). (Patient not taking: Reported on 12/19/2023), Disp: , Rfl:    oxybutynin (DITROPAN) 5 MG tablet, Take 5 mg by mouth. (Patient not taking: Reported on 12/19/2023), Disp: , Rfl:    Potassium Chloride  40 MEQ/15ML (20%) SOLN, Take  20 mEq by mouth. (Patient not taking: Reported on 12/19/2023), Disp: , Rfl:    potassium chloride  SA (KLOR-CON  M) 20 MEQ tablet, Take 1 tablet (20 mEq total) by mouth daily. (Patient not taking: Reported on 12/19/2023), Disp: 90 tablet, Rfl: 3   pravastatin  (PRAVACHOL ) 10 MG tablet, Take 10 mg by mouth daily., Disp: , Rfl:    sertraline  (ZOLOFT ) 100 MG tablet, Take 1 tablet (100 mg total) by mouth daily. (Patient not taking: Reported on 12/19/2023), Disp: 30 tablet, Rfl: 11    sulfamethoxazole -trimethoprim  (BACTRIM ) 400-80 MG tablet, Take 1 tablet by mouth 3 (three) times a week., Disp: , Rfl:   Past Medical History: Past Medical History:  Diagnosis Date   Allergies    Anxiety    Aortic atherosclerosis    Bipolar affective disorder (HCC)    COPD (chronic obstructive pulmonary disease) (HCC)    Coronary artery calcification seen on CT scan    Depression    Diastolic dysfunction 02/24/2020   a.) TTE 02/24/2020: EF >55%; triv PR, mild TR, mod MR; G1DD.   DOE (dyspnea on exertion)    Emphysema lung (HCC)    History of cataract    HLD (hyperlipidemia)    Hypertension    Macular degeneration    Migraines    Osteoporosis    Pneumonia    Tobacco use     Tobacco Use: Social History   Tobacco Use  Smoking Status Former   Current packs/day: 0.00   Average packs/day: 0.3 packs/day for 50.0 years (12.5 ttl pk-yrs)   Types: Cigarettes   Start date: 01/10/1972   Quit date: 01/09/2022   Years since quitting: 2.2  Smokeless Tobacco Never    Labs: Review Flowsheet  More data exists      Latest Ref Rng & Units 06/27/2023 06/29/2023 07/02/2023 07/17/2023 08/21/2023  Labs for ITP Cardiac and Pulmonary Rehab  PH, Arterial 7.35 - 7.45 7.44  - 7.6  7.4  -  PCO2 arterial 32 - 48 mmHg 46  - 47  33  -  Bicarbonate 20.0 - 28.0 mmol/L 31.2  26.8  26.6  46.1  20.4  25.0   Acid-base deficit 0.0 - 2.0 mmol/L - 0.1  4.5  - 3.6  3.1   O2 Saturation % 99.9  95  55.1  97.1  89.2  90.4     Details       Multiple values from one day are sorted in reverse-chronological order          Pulmonary Assessment Scores:  Pulmonary Assessment Scores     Row Name 11/09/23 1110 11/16/23 0936 03/13/24 0954     ADL UCSD   ADL Phase -- Entry --   SOB Score total -- 53 --   Rest -- 1 --   Walk -- 0 --   Stairs -- 5 --   Bath -- 0 --   Dress -- 0 --   Shop -- 2 --     CAT Score   CAT Score -- 19 --     mMRC Score   mMRC Score 2 2 4     Row Name 03/14/24 1011          ADL UCSD   SOB Score total 53     Rest 0     Walk 1     Stairs 5     Bath 0     Dress 0     Shop 4       CAT Score  CAT Score 16        UCSD: Self-administered rating of dyspnea associated with activities of daily living (ADLs) 6-point scale (0 = not at all to 5 = maximal or unable to do because of breathlessness)  Scoring Scores range from 0 to 120.  Minimally important difference is 5 units  CAT: CAT can identify the health impairment of COPD patients and is better correlated with disease progression.  CAT has a scoring range of zero to 40. The CAT score is classified into four groups of low (less than 10), medium (10 - 20), high (21-30) and very high (31-40) based on the impact level of disease on health status. A CAT score over 10 suggests significant symptoms.  A worsening CAT score could be explained by an exacerbation, poor medication adherence, poor inhaler technique, or progression of COPD or comorbid conditions.  CAT MCID is 2 points  mMRC: mMRC (Modified Medical Research Council) Dyspnea Scale is used to assess the degree of baseline functional disability in patients of respiratory disease due to dyspnea. No minimal important difference is established. A decrease in score of 1 point or greater is considered a positive change.   Pulmonary Function Assessment:  Pulmonary Function Assessment - 11/08/23 1429       Breath   Shortness of Breath Yes;Fear of Shortness of Breath;Limiting activity;Panic with Shortness of Breath          Exercise Target Goals: Exercise Program Goal: Individual exercise prescription set using results from initial 6 min walk test and THRR while considering  patient's activity barriers and safety.   Exercise Prescription Goal: Initial exercise prescription builds to 30-45 minutes a day of aerobic activity, 2-3 days per week.  Home exercise guidelines will be given to patient during program as part of exercise prescription that the  participant will acknowledge.  Education: Aerobic Exercise: - Group verbal and visual presentation on the components of exercise prescription. Introduces F.I.T.T principle from ACSM for exercise prescriptions.  Reviews F.I.T.T. principles of aerobic exercise including progression. Written material provided at class time.   Education: Resistance Exercise: - Group verbal and visual presentation on the components of exercise prescription. Introduces F.I.T.T principle from ACSM for exercise prescriptions  Reviews F.I.T.T. principles of resistance exercise including progression. Written material provided at class time.    Education: Exercise & Equipment Safety: - Individual verbal instruction and demonstration of equipment use and safety with use of the equipment. Flowsheet Row Pulmonary Rehab from 11/08/2023 in [facility] Cardiac [facility] Pulmonary Rehab  Date 11/08/23  Educator [facility]  Instruction Review Code 1- Verbalizes Understanding    Education: Exercise Physiology & General Exercise Guidelines: - Group verbal and written instruction with models to review the exercise physiology of the cardiovascular system and associated critical values. Provides general exercise guidelines with specific guidelines to those with heart or lung disease.    Education: Flexibility, Balance, Mind/Body Relaxation: - Group verbal and visual presentation with interactive activity on the components of exercise prescription. Introduces F.I.T.T principle from ACSM for exercise prescriptions. Reviews F.I.T.T. principles of flexibility and balance exercise training including progression. Also discusses the mind body connection.  Reviews various relaxation techniques to help reduce and manage stress (i.e. Deep breathing, progressive muscle relaxation, and visualization). Balance handout provided to take home. Written material provided at class time.   Activity Barriers & Risk Stratification:  Activity Barriers & Cardiac Risk  Stratification - 11/09/23 1123       Activity Barriers & Cardiac Risk Stratification   Activity  Barriers Right Hip Replacement;Other (comment);Assistive Device;Shortness of Breath;History of Falls;Balance Concerns    Comments osteoporosis          6 Minute Walk:  6 Minute Walk     Row Name 11/09/23 1117 03/13/24 0949       6 Minute Walk   Phase Initial Discharge    Distance 455 feet 900 feet    Distance % Change -- 97.8 %    Distance Feet Change -- 445 ft    Walk Time 6 minutes 6 minutes    # of Rest Breaks 0 0    MPH 0.86 1.7    METS 1.68 2.37    RPE 15 12    Perceived Dyspnea  4 1    VO2 Peak 5.88 8.31    Symptoms Yes (comment) Yes (comment)    Comments used walker used walker    Resting HR 85 bpm 77 bpm    Resting BP 132/68 158/74    Resting Oxygen Saturation  95 % 92 %    Exercise Oxygen Saturation  during 6 min walk 90 % 90 %    Max Ex. HR 105 bpm 92 bpm    Max Ex. BP 154/74 170/72    2 Minute Post BP 142/72 140/68      Interval HR   1 Minute HR 102 85    2 Minute HR 105 92    3 Minute HR 104 90    4 Minute HR 105 87    5 Minute HR 101 88    6 Minute HR 98 92    2 Minute Post HR 93 79    Interval Heart Rate? Yes Yes      Interval Oxygen   Interval Oxygen? Yes Yes    Baseline Oxygen Saturation % 95 % 92 %    1 Minute Oxygen Saturation % 91 % 91 %    1 Minute Liters of Oxygen 3 L  pulsed 3 L    2 Minute Oxygen Saturation % 92 % 92 %    2 Minute Liters of Oxygen 3 L 3 L    3 Minute Oxygen Saturation % 91 % 93 %    3 Minute Liters of Oxygen 3 L 3 L    4 Minute Oxygen Saturation % 90 % 90 %    4 Minute Liters of Oxygen 3 L 3 L    5 Minute Oxygen Saturation % 91 % 91 %    5 Minute Liters of Oxygen 3 L 3 L    6 Minute Oxygen Saturation % 91 % 90 %    6 Minute Liters of Oxygen 3 L 3 L    2 Minute Post Oxygen Saturation % 95 % 95 %    2 Minute Post Liters of Oxygen 3 L 3 L      Oxygen Initial Assessment:  Oxygen Initial Assessment - 11/08/23 1428        Home Oxygen   Home Oxygen Device None    Sleep Oxygen Prescription Continuous;BiPAP    Liters per minute 3    Home Exercise Oxygen Prescription None    Home Resting Oxygen Prescription None    Compliance with Home Oxygen Use Yes      Initial 6 min Walk   Oxygen Used None      Program Oxygen Prescription   Program Oxygen Prescription None      Intervention   Short Term Goals To learn and demonstrate proper pursed lip  breathing techniques or other breathing techniques. ;To learn and understand importance of monitoring SPO2 with pulse oximeter and demonstrate accurate use of the pulse oximeter.;To learn and exhibit compliance with exercise, home and travel O2 prescription;To learn and understand importance of maintaining oxygen saturations>88%    Long  Term Goals Verbalizes importance of monitoring SPO2 with pulse oximeter and return demonstration;Exhibits proper breathing techniques, such as pursed lip breathing or other method taught during program session;Demonstrates proper use of MDI's;Exhibits compliance with exercise, home  and travel O2 prescription;Maintenance of O2 saturations>88%;Compliance with respiratory medication          Oxygen Re-Evaluation:  Oxygen Re-Evaluation     Row Name 11/16/23 0946 12/14/23 0948 01/17/24 0930 02/08/24 1002 03/21/24 1006     Program Oxygen Prescription   Program Oxygen Prescription None Continuous;E-Tanks Continuous;E-Tanks Continuous;E-Tanks Continuous;E-Tanks   Liters per minute -- 3 3 3 3      Home Oxygen   Home Oxygen Device None E-Tanks;Home Concentrator;Portable Concentrator E-Tanks;Home Concentrator;Portable Concentrator E-Tanks;Home Concentrator;Portable Concentrator Home Concentrator;Portable Concentrator;E-Tanks   Sleep Oxygen Prescription Continuous;BiPAP BiPAP BiPAP BiPAP BiPAP   Liters per minute 3 3 3 3 3    Home Exercise Oxygen Prescription None Continuous Continuous Continuous Continuous   Liters per minute -- 3 3 3 3    Home  Resting Oxygen Prescription None None None None None   Compliance with Home Oxygen Use Yes Yes Yes Yes Yes     Goals/Expected Outcomes   Short Term Goals To learn and demonstrate proper pursed lip breathing techniques or other breathing techniques.  To learn and demonstrate proper pursed lip breathing techniques or other breathing techniques.  To learn and understand importance of maintaining oxygen saturations>88%;To learn and understand importance of monitoring SPO2 with pulse oximeter and demonstrate accurate use of the pulse oximeter. Other;To learn and demonstrate proper pursed lip breathing techniques or other breathing techniques.  To learn and demonstrate proper pursed lip breathing techniques or other breathing techniques.    Long  Term Goals Exhibits proper breathing techniques, such as pursed lip breathing or other method taught during program session Exhibits proper breathing techniques, such as pursed lip breathing or other method taught during program session Maintenance of O2 saturations>88%;Verbalizes importance of monitoring SPO2 with pulse oximeter and return demonstration Other;Exhibits proper breathing techniques, such as pursed lip breathing or other method taught during program session Exhibits proper breathing techniques, such as pursed lip breathing or other method taught during program session   Comments Reviewed PLB technique with pt.  Talked about how it works and it's importance in maintaining their exercise saturations. Informed patient how to perform the Pursed Lipped breathing technique. Told patient to Inhale through the nose and out the mouth with pursed lips to keep their airways open, help oxygenate them better, practice when at rest or doing strenuous activity. Patient Verbalizes understanding of technique and will work on and be reiterated during [facility]. Amayrany has a pulse oximeter to check her oxygen saturation at home. Informed and explained why it is important to have  one. Reviewed that oxygen saturations should be 88 percent and above. Diaphragmatic and PLB breathing explained and performed with patient. Patient has a better understanding of how to do these exercises to help with breathing performance and relaxation. Patient performed breathing techniques adequately and to practice further at home. Spoke with Coretta abut PLB techinques when exerting herself. Encouraged her to monitor her O2 saturations post rehab graduation   Goals/Expected Outcomes Short: Become more profiecient at using PLB. Long: Become  independent at using PLB. Short: use PLB with exertion. Long: use PLB on exertion proficiently and independently. Short: monitor oxygen at home with exertion. Long: maintain oxygen saturations above 88 percent independently. Short: Practice PLB and diaphragmatic breathing at home. Long: Use PLB and diaphragmatic breathing independently post [facility]. STG: Practice PLB and diaphragmatic breathing at home. LTG: Use PLB and diaphragmatic breathing independently post [facility]      Oxygen Discharge (Final Oxygen Re-Evaluation):  Oxygen Re-Evaluation - 03/21/24 1006       Program Oxygen Prescription   Program Oxygen Prescription Continuous;E-Tanks    Liters per minute 3      Home Oxygen   Home Oxygen Device Home Concentrator;Portable Concentrator;E-Tanks    Sleep Oxygen Prescription BiPAP    Liters per minute 3    Home Exercise Oxygen Prescription Continuous    Liters per minute 3    Home Resting Oxygen Prescription None    Compliance with Home Oxygen Use Yes      Goals/Expected Outcomes   Short Term Goals To learn and demonstrate proper pursed lip breathing techniques or other breathing techniques.     Long  Term Goals Exhibits proper breathing techniques, such as pursed lip breathing or other method taught during program session    Comments Spoke with Mabeline abut PLB techinques when exerting herself. Encouraged her to monitor her O2 saturations post rehab  graduation    Goals/Expected Outcomes STG: Practice PLB and diaphragmatic breathing at home. LTG: Use PLB and diaphragmatic breathing independently post [facility]          Initial Exercise Prescription:  Initial Exercise Prescription - 11/09/23 1100       Date of Initial Exercise RX and Referring Provider   Date 11/09/23    Referring Provider Dr. Fuad Aleskerov, MD      Oxygen   Oxygen Continuous    Liters 3    Maintain Oxygen Saturation 88% or higher      Recumbant Bike   Level 1    RPM 50    Watts 15    Minutes 15    METs 1.68      NuStep   Level 1    SPM 80    Minutes 15    METs 1.68      Track   Laps 12    Minutes 15    METs 1.65      Prescription Details   Frequency (times per week) 2    Duration Progress to 30 minutes of continuous aerobic without signs/symptoms of physical distress      Intensity   THRR 40-80% of Max Heartrate 108-132    Ratings of Perceived Exertion 11-13    Perceived Dyspnea 0-4      Progression   Progression Continue to progress workloads to maintain intensity without signs/symptoms of physical distress.      Resistance Training   Training Prescription Yes    Weight 2 lb    Reps 10-15          Perform Capillary Blood Glucose checks as needed.  Exercise Prescription Changes:   Exercise Prescription Changes     Row Name 11/09/23 1100 11/21/23 1400 12/06/23 1300 12/12/23 1000 12/20/23 1600     Response to Exercise   Blood Pressure (Admit) 132/68 132/70 132/60 -- 118/64   Blood Pressure (Exercise) 154/74 148/68 156/64 -- 160/70   Blood Pressure (Exit) 142/72 126/68 120/66 -- 128/60   Heart Rate (Admit) 85 bpm 74 bpm 81 bpm -- 95 bpm  Heart Rate (Exercise) 105 bpm 98 bpm 110 bpm -- 103 bpm   Heart Rate (Exit) 93 bpm 90 bpm 72 bpm -- 87 bpm   Oxygen Saturation (Admit) 95 % 98 % 96 % -- 90 %   Oxygen Saturation (Exercise) 90 % 92 % 89 % -- 98 %   Oxygen Saturation (Exit) 95 % 93 % 91 % -- 94 %   Rating of Perceived  Exertion (Exercise) 15 15 14  -- 15   Perceived Dyspnea (Exercise) 4 3 2  -- 3   Symptoms used walker none none -- none   Comments Results 1st day of exercise -- -- --   Duration -- Continue with 30 min of aerobic exercise without signs/symptoms of physical distress. Continue with 30 min of aerobic exercise without signs/symptoms of physical distress. -- Continue with 30 min of aerobic exercise without signs/symptoms of physical distress.   Intensity -- THRR unchanged THRR unchanged -- THRR unchanged     Progression   Progression -- Continue to progress workloads to maintain intensity without signs/symptoms of physical distress. Continue to progress workloads to maintain intensity without signs/symptoms of physical distress. -- Continue to progress workloads to maintain intensity without signs/symptoms of physical distress.   Average METs -- 2.4 1.81 -- 1.48     Resistance Training   Training Prescription -- Yes Yes -- Yes   Weight -- 2 lb 2 lb -- 2 lb   Reps -- 10-15 10-15 -- 10-15     Interval Training   Interval Training -- No No -- No     Oxygen   Oxygen -- Continuous Continuous -- Continuous   Liters -- 2 2 -- 2-3     Recumbant Bike   Level -- 1 -- -- --   Watts -- 15 -- -- --   Minutes -- 15 -- -- --   METs -- 2.99 -- -- --     NuStep   Level -- 1 2 -- 1   Minutes -- 15 15 -- 15   METs -- 1.8 1.6 -- 1.5     Arm Ergometer   Level -- -- -- -- 1   Minutes -- -- -- -- 15   METs -- -- -- -- 1     Track   Laps -- -- 32 -- 24   Minutes -- -- 15 -- 15   METs -- -- 2.74 -- 2.31     Home Exercise Plan   Plans to continue exercise at -- -- -- Home (comment)  walking, resistance w/ weights, keeping up with 33 year old grandbaby Home (comment)  walking, resistance w/ weights, keeping up with 62 year old grandbaby   Frequency -- -- -- Add 3 additional days to program exercise sessions. Add 3 additional days to program exercise sessions.   Initial Home Exercises Provided -- --  -- 12/12/23 12/12/23     Oxygen   Maintain Oxygen Saturation -- 88% or higher 88% or higher -- 88% or higher    Row Name 01/02/24 1400 01/16/24 1500 01/31/24 1400 02/15/24 1800 02/29/24 1600     Response to Exercise   Blood Pressure (Admit) 124/70 136/64 122/68 136/6 140/64   Blood Pressure (Exercise) -- 164/62 -- -- --   Blood Pressure (Exit) 130/60 134/58 128/78 130/68 144/60   Heart Rate (Admit) 97 bpm 87 bpm 91 bpm 93 bpm 95 bpm   Heart Rate (Exercise) 111 bpm 105 bpm 104 bpm 103 bpm 105 bpm   Heart  Rate (Exit) 95 bpm 84 bpm 78 bpm 87 bpm 81 bpm   Oxygen Saturation (Admit) 93 % 95 % 92 % 89 % 89 %   Oxygen Saturation (Exercise) 88 % 88 % 86 % 86 %  came up to 88 with rest 88 %   Oxygen Saturation (Exit) 93 % 90 % 96 % 94 % 94 %   Rating of Perceived Exertion (Exercise) 17 14 15 13 13    Perceived Dyspnea (Exercise) 4 2 2 3 3    Symptoms none none none none none   Duration Continue with 30 min of aerobic exercise without signs/symptoms of physical distress. Continue with 30 min of aerobic exercise without signs/symptoms of physical distress. Continue with 30 min of aerobic exercise without signs/symptoms of physical distress. Continue with 30 min of aerobic exercise without signs/symptoms of physical distress. Continue with 30 min of aerobic exercise without signs/symptoms of physical distress.   Intensity THRR unchanged THRR unchanged THRR unchanged THRR unchanged THRR unchanged     Progression   Progression Continue to progress workloads to maintain intensity without signs/symptoms of physical distress. Continue to progress workloads to maintain intensity without signs/symptoms of physical distress. Continue to progress workloads to maintain intensity without signs/symptoms of physical distress. Continue to progress workloads to maintain intensity without signs/symptoms of physical distress. Continue to progress workloads to maintain intensity without signs/symptoms of physical distress.    Average METs 2.04 2.21 2.03 1.95 2.27     Resistance Training   Training Prescription Yes Yes Yes Yes Yes   Weight 2 lb 2 lb 2 lb 2 lb 2 lb   Reps 10-15 10-15 10-15 10-15 10-15     Interval Training   Interval Training No No No No No     Oxygen   Oxygen Continuous Continuous Continuous Continuous Continuous   Liters 3 3 3  3-4 2     NuStep   Level 1 1 2 3 5    Minutes 15 15 15 15 15    METs 1.6 1.6 2 1.6 2     Track   Laps 52 52 42 30 44   Minutes 15 15 15 15 15    METs 3.8 3.83 3.28 2.63 3.39     Home Exercise Plan   Plans to continue exercise at Home (comment)  walking, resistance w/ weights, keeping up with 18 year old grandbaby Home (comment)  walking, resistance w/ weights, keeping up with 60 year old grandbaby Home (comment)  walking, resistance w/ weights, keeping up with 69 year old grandbaby Home (comment)  walking, resistance w/ weights, keeping up with 66 year old grandbaby Home (comment)  walking, resistance w/ weights, keeping up with 32 year old grandbaby   Frequency Add 3 additional days to program exercise sessions. Add 3 additional days to program exercise sessions. Add 3 additional days to program exercise sessions. Add 3 additional days to program exercise sessions. Add 3 additional days to program exercise sessions.   Initial Home Exercises Provided 12/12/23 12/12/23 12/12/23 12/12/23 12/12/23     Oxygen   Maintain Oxygen Saturation 88% or higher 88% or higher 88% or higher 88% or higher 88% or higher    Row Name 03/11/24 1100             Response to Exercise   Blood Pressure (Admit) 138/64       Blood Pressure (Exit) 118/56       Heart Rate (Admit) 74 bpm       Heart Rate (Exercise) 97  bpm       Heart Rate (Exit) 77 bpm       Oxygen Saturation (Admit) 90 %       Oxygen Saturation (Exercise) 87 %       Oxygen Saturation (Exit) 98 %       Rating of Perceived Exertion (Exercise) 14       Perceived Dyspnea (Exercise) 3       Symptoms none       Duration  Continue with 30 min of aerobic exercise without signs/symptoms of physical distress.       Intensity THRR unchanged         Progression   Progression Continue to progress workloads to maintain intensity without signs/symptoms of physical distress.       Average METs 1.8         Resistance Training   Training Prescription Yes       Weight 2 lb       Reps 10-15         Interval Training   Interval Training No         Oxygen   Oxygen Continuous       Liters 3         NuStep   Level 3       Minutes 15       METs 1.5         Track   Laps 35       Minutes 15       METs 2.9         Home Exercise Plan   Plans to continue exercise at Home (comment)  walking, resistance w/ weights, keeping up with 42 year old grandbaby       Frequency Add 3 additional days to program exercise sessions.       Initial Home Exercises Provided 12/12/23         Oxygen   Maintain Oxygen Saturation 88% or higher          Exercise Comments:   Exercise Comments     Row Name 11/16/23 0945           Exercise Comments First full day of exercise!  Patient was oriented to gym and equipment including functions, settings, policies, and procedures.  Patient's individual exercise prescription and treatment plan were reviewed.  All starting workloads were established based on the results of the 6 minute walk test done at initial orientation visit.  The plan for exercise progression was also introduced and progression will be customized based on patient's performance and goals.          Exercise Goals and Review:   Exercise Goals     Row Name 11/09/23 1124             Exercise Goals   Increase Physical Activity Yes       Intervention Provide advice, education, support and counseling about physical activity/exercise needs.;Develop an individualized exercise prescription for aerobic and resistive training based on initial evaluation findings, risk stratification, comorbidities and participant's  personal goals.       Expected Outcomes Short Term: Attend rehab on a regular basis to increase amount of physical activity.;Long Term: Exercising regularly at least 3-5 days a week.;Long Term: Add in home exercise to make exercise part of routine and to increase amount of physical activity.       Increase Strength and Stamina Yes       Intervention Develop an individualized exercise prescription for  aerobic and resistive training based on initial evaluation findings, risk stratification, comorbidities and participant's personal goals.;Provide advice, education, support and counseling about physical activity/exercise needs.       Expected Outcomes Short Term: Increase workloads from initial exercise prescription for resistance, speed, and METs.;Short Term: Perform resistance training exercises routinely during rehab and add in resistance training at home;Long Term: Improve cardiorespiratory fitness, muscular endurance and strength as measured by increased METs and functional capacity ( )       Able to understand and use rate of perceived exertion (RPE) scale Yes       Intervention Provide education and explanation on how to use RPE scale       Expected Outcomes Short Term: Able to use RPE daily in rehab to express subjective intensity level;Long Term:  Able to use RPE to guide intensity level when exercising independently       Able to understand and use Dyspnea scale Yes       Intervention Provide education and explanation on how to use Dyspnea scale       Expected Outcomes Short Term: Able to use Dyspnea scale daily in rehab to express subjective sense of shortness of breath during exertion;Long Term: Able to use Dyspnea scale to guide intensity level when exercising independently       Knowledge and understanding of Target Heart Rate Range (THRR) Yes       Intervention Provide education and explanation of THRR including how the numbers were predicted and where they are located for reference        Expected Outcomes Short Term: Able to state/look up THRR;Long Term: Able to use THRR to govern intensity when exercising independently;Short Term: Able to use daily as guideline for intensity in rehab       Able to check pulse independently Yes       Intervention Provide education and demonstration on how to check pulse in carotid and radial arteries.;Review the importance of being able to check your own pulse for safety during independent exercise       Expected Outcomes Short Term: Able to explain why pulse checking is important during independent exercise;Long Term: Able to check pulse independently and accurately       Understanding of Exercise Prescription Yes       Intervention Provide education, explanation, and written materials on patient's individual exercise prescription       Expected Outcomes Short Term: Able to explain program exercise prescription;Long Term: Able to explain home exercise prescription to exercise independently          Exercise Goals Re-Evaluation :  Exercise Goals Re-Evaluation     Row Name 11/16/23 0945 11/21/23 1450 12/06/23 1350 12/12/23 1033 12/20/23 1621     Exercise Goal Re-Evaluation   Exercise Goals Review Able to understand and use rate of perceived exertion (RPE) scale;Knowledge and understanding of Target Heart Rate Range (THRR);Able to understand and use Dyspnea scale;Understanding of Exercise Prescription Increase Physical Activity;Understanding of Exercise Prescription;Increase Strength and Stamina Increase Physical Activity;Understanding of Exercise Prescription;Increase Strength and Stamina Increase Physical Activity;Able to understand and use rate of perceived exertion (RPE) scale;Knowledge and understanding of Target Heart Rate Range (THRR);Understanding of Exercise Prescription;Increase Strength and Stamina;Able to understand and use Dyspnea scale;Able to check pulse independently Increase Physical Activity;Understanding of Exercise  Prescription;Increase Strength and Stamina   Comments eviewed RPE and dyspnea scale, THR and program prescription with pt today.  Pt voiced understanding and was given a copy of goals to take home. [name]  is off to a good start in the program and completed her first day in this review. She tolerated her exercise prescription well with level 1 on the T4 nustep and level 1 on the recumbent bike. She used 2 lb weights for resistance. We will continue to monitor her progress in the program. Ardith is doing well in rehab. She was recently able to increase from level 1 to 2 on the T4 nustep. She was also able to walk 32 laps on the track. We will continue to monitor her progress in the program. Reviewed home exercise with pt today from 9:25 to 9:35am.  Pt plans to walk for aerobic and use weights for resistance at home and keep up with her 10 year old great grandbaby for exercise. She plans to add 3 additional days at home. Reviewed THR, pulse, RPE, sign and symptoms, pulse oximetery and when to call 911 or MD.  Also discussed weather considerations and indoor options.  Pt voiced understanding. Shavanna is doing well in rehab. She was able to use the arm ergometer at level 1. She worked at level 1 on the T4 nustep and walked 24 laps on the track. We will continue to monitor her progress in the program.   Expected Outcomes Short: Use RPE daily to regulate intensity. Long: Follow program prescription in THR. Short: Continue to follow current exercise prescription. Long: Continue exercise to improve strength and stamina. Short: Continue to follow current exercise prescription. Long: Continue exercise to improve strength and stamina. Short: Add 3 additional days of exercise at home. Long: Continue to exercise at home independently. Short: Try level 2 on the T4 nustep and push for more laps on the track. Long: Continue exercise to improve strength and stamina.    Row Name 01/02/24 1500 01/16/24 1524 01/31/24 1411 02/08/24 1004  02/15/24 1825     Exercise Goal Re-Evaluation   Exercise Goals Review Increase Physical Activity;Understanding of Exercise Prescription;Increase Strength and Stamina Increase Physical Activity;Understanding of Exercise Prescription;Increase Strength and Stamina Increase Physical Activity;Understanding of Exercise Prescription;Increase Strength and Stamina Increase Physical Activity;Increase Strength and Stamina;Understanding of Exercise Prescription Increase Physical Activity;Increase Strength and Stamina;Understanding of Exercise Prescription   Comments Sunaina is doing well in rehab. She increased her laps on the track to 30 and 52 laps. She maintained level 1 on the T4 nustep. We will continue to monitor her progress in the program. Dortha continues to do well in rehab. She has continued to walk 52 laps on the track. She also has maintained her workload at level 1 on the T4 nustep. We will continue to monitor her progress in the program. Timberlee is doing well in the program. She continues to walk at least a mile on the track in 15 minutes. She was also able to increase from level 1 to 2 on the T4 nustep. We will continue to monitor her progress in the program. Lorissa states she is going to increase her stamina by turning up her levels on her machines. She is doing well with walking the track and increasing her distance. Nguyet is doing well in rehab. She increased to level 3 on the T4 nustep. She decreased her laps on the track slightly to 30 laps from 42. We will continue to monitor her progress in the program.   Expected Outcomes Short: Try level 2 on the T4 nustep. Long: Continue exercise to improve strength and stamina. Short: Increase to level 2 on the T4 nustep. Long: Continue exercise to improve strength and  stamina. Short: Continue to increase level on T4 nustep. Long: Continue exercise to improve strength and stamina. Short: increase levels on all machines. Long: continue exercise independently. Short:  Push for more laps on the track to get back to 42. Long: Continue exercise to improve strength and stamina.    Row Name 02/29/24 1615 03/11/24 1141 03/21/24 1004         Exercise Goal Re-Evaluation   Exercise Goals Review Increase Physical Activity;Increase Strength and Stamina;Understanding of Exercise Prescription Increase Physical Activity;Increase Strength and Stamina;Understanding of Exercise Prescription Increase Physical Activity;Increase Strength and Stamina;Understanding of Exercise Prescription     Comments Terin continues to do well in rehab. She was recently able to increase her track laps from 30 to 44 laps. She was also able to increase from level 3 to 5 on the T4 nustep. We will continue to monitor her progress in the program. Akiera is doing well in rehab. She was able to walk 35 laps on the track, and used the T4 nustep at level 3. We will continue to encourage and monitor her progress in the program. Shanta is doing well in rehab and is also active at home, playing with 24.77 year old, which she syas brings her joy and keeps her active     Expected Outcomes Short: Continue to increase workloads on the T4 nustep. Long: Continue exercise to improve strength and stamina. Short: Improve on post-6MWT. Long: Continue to improve strength and stamina. STG: Graduate. LTG: Continue to exercise post rehab graduation        Discharge Exercise Prescription (Final Exercise Prescription Changes):  Exercise Prescription Changes - 03/11/24 1100       Response to Exercise   Blood Pressure (Admit) 138/64    Blood Pressure (Exit) 118/56    Heart Rate (Admit) 74 bpm    Heart Rate (Exercise) 97 bpm    Heart Rate (Exit) 77 bpm    Oxygen Saturation (Admit) 90 %    Oxygen Saturation (Exercise) 87 %    Oxygen Saturation (Exit) 98 %    Rating of Perceived Exertion (Exercise) 14    Perceived Dyspnea (Exercise) 3    Symptoms none    Duration Continue with 30 min of aerobic exercise without  signs/symptoms of physical distress.    Intensity THRR unchanged      Progression   Progression Continue to progress workloads to maintain intensity without signs/symptoms of physical distress.    Average METs 1.8      Resistance Training   Training Prescription Yes    Weight 2 lb    Reps 10-15      Interval Training   Interval Training No      Oxygen   Oxygen Continuous    Liters 3      NuStep   Level 3    Minutes 15    METs 1.5      Track   Laps 35    Minutes 15    METs 2.9      Home Exercise Plan   Plans to continue exercise at Home (comment)   walking, resistance w/ weights, keeping up with 4 year old grandbaby   Frequency Add 3 additional days to program exercise sessions.    Initial Home Exercises Provided 12/12/23      Oxygen   Maintain Oxygen Saturation 88% or higher          Nutrition:  Target Goals: Understanding of nutrition guidelines, daily intake of sodium 1500mg , cholesterol 200mg , calories  30% from fat and 7% or less from saturated fats, daily to have 5 or more servings of fruits and vegetables.  Education: Nutrition 1 -Group instruction provided by verbal, written material, interactive activities, discussions, models, and posters to present general guidelines for heart healthy nutrition including macronutrients, label reading, and promoting whole foods over processed counterparts. Education serves as Pensions consultant of discussion of heart healthy eating for all. Written material provided at class time.     Education: Nutrition 2 -Group instruction provided by verbal, written material, interactive activities, discussions, models, and posters to present general guidelines for heart healthy nutrition including sodium, cholesterol, and saturated fat. Providing guidance of habit forming to improve blood pressure, cholesterol, and body weight. Written material provided at class time.     Biometrics:  Pre Biometrics - 11/09/23 1124       Pre  Biometrics   Height 5' 2 (1.575 m)    Weight 100 lb 11.2 oz (45.7 kg)    Waist Circumference 26.5 inches    Hip Circumference 33.5 inches    Waist to Hip Ratio 0.79 %    BMI (Calculated) 18.41    Single Leg Stand 1.6 seconds          Post Biometrics - 03/13/24 0954        Post  Biometrics   Height 5' 2 (1.575 m)    Weight 99 lb 6.4 oz (45.1 kg)    Waist Circumference 27.7 inches    Hip Circumference 35 inches    Waist to Hip Ratio 0.79 %    BMI (Calculated) 18.18    Single Leg Stand 2 seconds          Nutrition Therapy Plan and Nutrition Goals:  Nutrition Therapy & Goals - 11/09/23 1133       Nutrition Therapy   RD appointment deferred Yes      Intervention Plan   Intervention Prescribe, educate and counsel regarding individualized specific dietary modifications aiming towards targeted core components such as weight, hypertension, lipid management, diabetes, heart failure and other comorbidities.    Expected Outcomes Short Term Goal: Understand basic principles of dietary content, such as calories, fat, sodium, cholesterol and nutrients.;Short Term Goal: A plan has been developed with personal nutrition goals set during dietitian appointment.;Long Term Goal: Adherence to prescribed nutrition plan.          Nutrition Assessments:  MEDIFICTS Score Key: >=70 Need to make dietary changes  40-70 Heart Healthy Diet <= 40 Therapeutic Level Cholesterol Diet  Flowsheet Row Pulmonary Rehab from 03/14/2024 in [facility]  Picture Your Plate Total Score on Admission 53  Picture Your Plate Total Score on Discharge 44   Picture Your Plate Scores: <59 Unhealthy dietary pattern with much room for improvement. 41-50 Dietary pattern unlikely to meet recommendations for good health and room for improvement. 51-60 More healthful dietary pattern, with some room for improvement.  >60 Healthy dietary pattern, although there may be some specific behaviors that  could be improved.   Nutrition Goals Re-Evaluation:  Nutrition Goals Re-Evaluation     Row Name 12/14/23 9047 01/17/24 9071 02/08/24 1008 03/21/24 1011       Goals   Comment Patient was informed on why it is important to maintain a balanced diet when dealing with Respiratory issues. Explained that it takes a lot of energy to breath and when they are short of breath often they will need to have a good diet to help keep up with the calories  they are expending for breathing. Patient deferred RD appointment. Patient deferred RD appointment. Patient deferred RD appointment.    Expected Outcome Short: Choose and plan snacks accordingly to patients caloric intake to improve breathing. Long: Maintain a diet independently that meets their caloric intake to aid in daily shortness of breath. -- -- --       Nutrition Goals Discharge (Final Nutrition Goals Re-Evaluation):  Nutrition Goals Re-Evaluation - 03/21/24 1011       Goals   Comment Patient deferred RD appointment.          Psychosocial: Target Goals: Acknowledge presence or absence of significant depression and/or stress, maximize coping skills, provide positive support system. Participant is able to verbalize types and ability to use techniques and skills needed for reducing stress and depression.   Education: Stress, Anxiety, and Depression - Group verbal and visual presentation to define topics covered.  Reviews how body is impacted by stress, anxiety, and depression.  Also discusses healthy ways to reduce stress and to treat/manage anxiety and depression.  Written material provided at class time.   Education: Sleep Hygiene -Provides group verbal and written instruction about how sleep can affect your health.  Define sleep hygiene, discuss sleep cycles and impact of sleep habits. Review good sleep hygiene tips.    Initial Review & Psychosocial Screening:  Initial Psych Review & Screening - 11/08/23 1430       Initial Review    Current issues with Current Psychotropic Meds      Family Dynamics   Good Support System? Yes    Comments Somaly sometimes gets depressed because she cannot breatj at times. She is unable to things that she wants to due to her COPD and she takes Zoloft  to help. She can look to her husband and grandchildren for support.      Barriers   Psychosocial barriers to participate in program The patient should benefit from training in stress management and relaxation.;Psychosocial barriers identified (see note)      Screening Interventions   Interventions Encouraged to exercise;Provide feedback about the scores to participant;To provide support and resources with identified psychosocial needs    Expected Outcomes Short Term goal: Utilizing psychosocial counselor, staff and physician to assist with identification of specific Stressors or current issues interfering with healing process. Setting desired goal for each stressor or current issue identified.;Long Term Goal: Stressors or current issues are controlled or eliminated.;Short Term goal: Identification and review with participant of any Quality of Life or Depression concerns found by scoring the questionnaire.;Long Term goal: The participant improves quality of Life and PHQ9 Scores as seen by post scores and/or verbalization of changes          Quality of Life Scores:  Scores of 19 and below usually indicate a poorer quality of life in these areas.  A difference of  2-3 points is a clinically meaningful difference.  A difference of 2-3 points in the total score of the Quality of Life Index has been associated with significant improvement in overall quality of life, self-image, physical symptoms, and general health in studies assessing change in quality of life.  PHQ-9: Review Flowsheet  More data may exist      03/21/2024 03/14/2024 02/08/2024 01/17/2024 11/09/2023  Depression screen PHQ 2/9  Decreased Interest 1 2 0 0 0  Down, Depressed, Hopeless 1 2  0 0 1  PHQ - 2 Score 2 4 0 0 1  Altered sleeping 1 2 0 0 3  Tired, decreased energy  2 3 1 3 3   Change in appetite 2 2 0 0 2  Feeling bad or failure about yourself  2 3 0 0 3  Trouble concentrating 2 2 0 1 3  Moving slowly or fidgety/restless 0 0 0 0 3  Suicidal thoughts 0 0 0 0 2  PHQ-9 Score 11 16 1 4 20   Difficult doing work/chores Somewhat difficult Somewhat difficult Not difficult at all Not difficult at all Extremely dIfficult   Interpretation of Total Score  Total Score Depression Severity:  1-4 = Minimal depression, 5-9 = Mild depression, 10-14 = Moderate depression, 15-19 = Moderately severe depression, 20-27 = Severe depression   Psychosocial Evaluation and Intervention:  Psychosocial Evaluation - 11/08/23 1433       Psychosocial Evaluation & Interventions   Interventions Encouraged to exercise with the program and follow exercise prescription;Relaxation education;Stress management education    Comments Ameka sometimes gets depressed because she cannot breatj at times. She is unable to things that she wants to due to her COPD and she takes Zoloft  to help. She can look to her husband and grandchildren for support.    Expected Outcomes Short: Start [facility] to help with mood. Long: Maintain a healthy mental state    Continue Psychosocial Services  Follow up required by staff          Psychosocial Re-Evaluation:  Psychosocial Re-Evaluation     Row Name 01/17/24 9061 02/08/24 1007 03/21/24 1008         Psychosocial Re-Evaluation   Current issues with Current Psychotropic Meds None Identified Current Depression;Current Sleep Concerns     Comments Reviewed patient health questionnaire (PHQ-9) with patient for follow up. Previously, patients score indicated signs/symptoms of depression.  Reviewed to see if patient is improving symptom wise while in program.  Score improved and patient states that it is because she is feeling better and able to exercise. Reviewed patient  health questionnaire (PHQ-9) with patient for follow up. Previously, patients score indicated signs/symptoms of depression.  Reviewed to see if patient is improving symptom wise while in program.  Score improved and patient states that it is because she has been feeling better physically and is improving her mental state. Samentha reports she has improved her mental health while at the program but still has moments where she feels anxious or depressed about her health. She likes coming to the rehab program and she knows she is nearing the end of her program stay. Encouraged her to conitnue to walk and do similar chair exercises done here at rehab when she is home. She sleeps well most nights but says sometimes, like last night she didnt sleep very well.     Expected Outcomes Short: Continue to attend [facility] regularly for regular exercise and social engagement. Long: Continue to improve symptoms and manage a positive mental state. Short: Continue to attend [facility] regularly for regular exercise and social engagement. Long: Continue to improve symptoms and manage a positive mental state. STG: Graduate. LTG: Continue to improve symptoms and manage a positive mental state.     Interventions Encouraged to attend Pulmonary Rehabilitation for the exercise Encouraged to attend Pulmonary Rehabilitation for the exercise Encouraged to attend Pulmonary Rehabilitation for the exercise     Continue Psychosocial Services  Follow up required by staff Follow up required by staff Follow up required by staff        Psychosocial Discharge (Final Psychosocial Re-Evaluation):  Psychosocial Re-Evaluation - 03/21/24 1008       Psychosocial  Re-Evaluation   Current issues with Current Depression;Current Sleep Concerns    Comments Denesha reports she has improved her mental health while at the program but still has moments where she feels anxious or depressed about her health. She likes coming to the rehab program and she knows  she is nearing the end of her program stay. Encouraged her to conitnue to walk and do similar chair exercises done here at rehab when she is home. She sleeps well most nights but says sometimes, like last night she didnt sleep very well.    Expected Outcomes STG: Graduate. LTG: Continue to improve symptoms and manage a positive mental state.    Interventions Encouraged to attend Pulmonary Rehabilitation for the exercise    Continue Psychosocial Services  Follow up required by staff          Education: Education Goals: Education classes will be provided on a weekly basis, covering required topics. Participant will state understanding/return demonstration of topics presented.  Learning Barriers/Preferences:  Learning Barriers/Preferences - 11/08/23 1430       Learning Barriers/Preferences   Learning Barriers None    Learning Preferences None          General Pulmonary Education Topics:  Infection Prevention: - Provides verbal and written material to individual with discussion of infection control including proper hand washing and proper equipment cleaning during exercise session. Flowsheet Row Pulmonary Rehab from 11/08/2023 in [facility] Cardiac and Pulmonary Rehab  Date 11/08/23  Educator [facility]  Instruction Review Code 1- Verbalizes Understanding    Falls Prevention: - Provides verbal and written material to individual with discussion of falls prevention and safety. Flowsheet Row Pulmonary Rehab from 11/08/2023 in [facility] Cardiac and Pulmonary Rehab  Date 11/08/23  Educator [facility]  Instruction Review Code 1- Verbalizes Understanding    Chronic Lung Disease Review: - Group verbal instruction with posters, models, PowerPoint presentations and videos,  to review new updates, new respiratory medications, new advancements in procedures and treatments. Providing information on websites and 800 numbers for continued self-education. Includes information about supplement oxygen, available portable  oxygen systems, continuous and intermittent flow rates, oxygen safety, concentrators, and Medicare reimbursement for oxygen. Explanation of Pulmonary Drugs, including class, frequency, complications, importance of spacers, rinsing mouth after steroid MDI's, and proper cleaning methods for nebulizers. Review of basic lung anatomy and physiology related to function, structure, and complications of lung disease. Review of risk factors. Discussion about methods for diagnosing sleep apnea and types of masks and machines for OSA. Includes a review of the use of types of environmental controls: home humidity, furnaces, filters, dust mite/pet prevention, HEPA vacuums. Discussion about weather changes, air quality and the benefits of nasal washing. Instruction on Warning signs, infection symptoms, calling MD promptly, preventive modes, and value of vaccinations. Review of effective airway clearance, coughing and/or vibration techniques. Emphasizing that all should Create an Action Plan. Written material provided at class time.   AED/CPR: - Group verbal and written instruction with the use of models to demonstrate the basic use of the AED with the basic ABC's of resuscitation.    Tests and Procedures:  - Group verbal and visual presentation and models provide information about basic cardiac anatomy and function. Reviews the testing methods done to diagnose heart disease and the outcomes of the test results. Describes the treatment choices: Medical Management, Angioplasty, or Coronary Bypass Surgery for treating various heart conditions including Myocardial Infarction, Angina, Valve Disease, and Cardiac Arrhythmias.  Written material provided at class time.   Medication  Safety: - Group verbal and visual instruction to review commonly prescribed medications for heart and lung disease. Reviews the medication, class of the drug, and side effects. Includes the steps to properly store meds and maintain the prescription  regimen.  Written material given at graduation.   Other: -Provides group and verbal instruction on various topics (see comments)   Knowledge Questionnaire Score:  Knowledge Questionnaire Score - 03/14/24 1009       Knowledge Questionnaire Score   Pre Score 12/18    Post Score 15/18           Core Components/Risk Factors/Patient Goals at Admission:  Personal Goals and Risk Factors at Admission - 11/08/23 1429       Core Components/Risk Factors/Patient Goals on Admission    Weight Management Yes;Weight Gain    Intervention Weight Management: Develop a combined nutrition and exercise program designed to reach desired caloric intake, while maintaining appropriate intake of nutrient and fiber, sodium and fats, and appropriate energy expenditure required for the weight goal.;Weight Management: Provide education and appropriate resources to help participant work on and attain dietary goals.;Obesity: Provide education and appropriate resources to help participant work on and attain dietary goals.    Expected Outcomes Short Term: Continue to assess and modify interventions until short term weight is achieved;Weight Loss: Understanding of general recommendations for a balanced deficit meal plan, which promotes 1-2 lb weight loss per week and includes a negative energy balance of [phone] kcal/d;Understanding recommendations for meals to include 15-35% energy as protein, 25-35% energy from fat, 35-60% energy from carbohydrates, less than 200mg  of dietary cholesterol, 20-35 gm of total fiber daily;Understanding of distribution of calorie intake throughout the day with the consumption of 4-5 meals/snacks;Weight Gain: Understanding of general recommendations for a high calorie, high protein meal plan that promotes weight gain by distributing calorie intake throughout the day with the consumption for 4-5 meals, snacks, and/or supplements    Improve shortness of breath with ADL's Yes    Intervention  Provide education, individualized exercise plan and daily activity instruction to help decrease symptoms of SOB with activities of daily living.    Expected Outcomes Short Term: Improve cardiorespiratory fitness to achieve a reduction of symptoms when performing ADLs;Long Term: Be able to perform more ADLs without symptoms or delay the onset of symptoms    Hypertension Yes    Intervention Provide education on lifestyle modifcations including regular physical activity/exercise, weight management, moderate sodium restriction and increased consumption of fresh fruit, vegetables, and low fat dairy, alcohol moderation, and smoking cessation.;Monitor prescription use compliance.    Expected Outcomes Short Term: Continued assessment and intervention until BP is < 140/1mm HG in hypertensive participants. < 130/36mm HG in hypertensive participants with diabetes, heart failure or chronic kidney disease.;Long Term: Maintenance of blood pressure at goal levels.    Lipids Yes    Intervention Provide education and support for participant on nutrition & aerobic/resistive exercise along with prescribed medications to achieve LDL 70mg , HDL >40mg .    Expected Outcomes Short Term: Participant states understanding of desired cholesterol values and is compliant with medications prescribed. Participant is following exercise prescription and nutrition guidelines.;Long Term: Cholesterol controlled with medications as prescribed, with individualized exercise RX and with personalized nutrition plan. Value goals: LDL < 70mg , HDL > 40 mg.          Education:Diabetes - Individual verbal and written instruction to review signs/symptoms of diabetes, desired ranges of glucose level fasting, after meals and with exercise. Acknowledge that pre and  post exercise glucose checks will be done for 3 sessions at entry of program.   Know Your Numbers and Heart Failure: - Group verbal and visual instruction to discuss disease risk  factors for cardiac and pulmonary disease and treatment options.  Reviews associated critical values for Overweight/Obesity, Hypertension, Cholesterol, and Diabetes.  Discusses basics of heart failure: signs/symptoms and treatments.  Introduces Heart Failure Zone chart for action plan for heart failure. Written material provided at class time.   Core Components/Risk Factors/Patient Goals Review:   Goals and Risk Factor Review     Row Name 12/14/23 0951 01/17/24 0931 02/08/24 1008 03/21/24 1012       Core Components/Risk Factors/Patient Goals Review   Personal Goals Review Improve shortness of breath with ADL's Other Other Improve shortness of breath with ADL's    Review Spoke to patient about their shortness of breath and what they can do to improve. Patient has been informed of breathing techniques when starting the program. Patient is informed to tell staff if they have had any med changes and that certain meds they are taking or not taking can be causing shortness of breath. Diaphragmatic and PLB breathing explained and performed with patient. Patient has a better understanding of how to do these exercises to help with breathing performance and relaxation. Patient performed breathing techniques adequately and to practice further at home. Shalina states that she has no questions about her medications or vital signs. She has been doing well in the program and will follow up with patient over the next few weeks. Penni reports she is bale to breath and moe better since starting the program and wants to make sure she can continue to improve even after graduating from rehab program    Expected Outcomes Short: Attend [facility] regularly to improve shortness of breath with ADL's. Long: maintain independence with ADL's Short: practice PLB and diaphragmatic breathing at home. Long: Use PLB and diaphragmatic breathing independently post [facility]. Short: continue to attend [facility]. Long: maintain exercise  independently. STG: Graduate and continue home exercise plan established by EP. LTG: maintain exercise independently.       Core Components/Risk Factors/Patient Goals at Discharge (Final Review):   Goals and Risk Factor Review - 03/21/24 1012       Core Components/Risk Factors/Patient Goals Review   Personal Goals Review Improve shortness of breath with ADL's    Review Shaakira reports she is bale to breath and moe better since starting the program and wants to make sure she can continue to improve even after graduating from rehab program    Expected Outcomes STG: Graduate and continue home exercise plan established by EP. LTG: maintain exercise independently.          ITP Comments:  ITP Comments     Row Name 11/08/23 1433 11/09/23 1110 11/16/23 0944 01/03/24 0843 01/31/24 0924   ITP Comments Virtual Visit completed. Patient informed on EP and RD appointment and 6 Minute walk test. Patient also informed of patient health questionnaires on My Chart. Patient Verbalizes understanding. Visit diagnosis can be found in [facility] 10/04/2023. Completed and gym orientation for respiratory care services. Initial ITP created and sent for review to Dr. Faud Aleskerov, Medical Director. First full day of exercise!  Patient was oriented to gym and equipment including functions, settings, policies, and procedures.  Patient's individual exercise prescription and treatment plan were reviewed.  All starting workloads were established based on the results of the 6 minute walk test done at initial orientation visit.  The plan for exercise progression was also introduced and progression will be customized based on patient's performance and goals. 30 Day review completed. Medical Director ITP review done, changes made as directed, and signed approval by Medical Director. new to program. 30 Day review completed. Medical Director ITP review done; changes made as directed and signed approval by Medical Director.    Row  Name 02/28/24 1356 03/27/24 1008         ITP Comments 30 Day review completed. Medical Director ITP review done; changes made as directed and signed approval by Medical Director. 30 Day review completed. Medical Director ITP review done; changes made as directed and signed approval by Medical Director.         Comments:  30 day review

## 2024-03-28 ENCOUNTER — Encounter: Admitting: Emergency Medicine

## 2024-03-28 DIAGNOSIS — J449 Chronic obstructive pulmonary disease, unspecified: Secondary | ICD-10-CM

## 2024-03-28 NOTE — Progress Notes (Signed)
 Daily Session Note  Patient Details  Name: Taylor Chambers MRN: [id number] Date of Birth: 1945/11/15 Referring Provider:   [facility] Pulmonary Rehab from 11/09/2023 in [facility] Cardiac and Pulmonary Rehab  Referring Provider Dr. Halina Picking, MD    Encounter Date: 03/28/2024  Check In:  Session Check In - 03/28/24 0939       Check-In   Supervising physician immediately available to respond to emergencies See telemetry face sheet for immediately available ER MD    Location [facility]-Cardiac & Pulmonary Rehab    Staff Present Rollene Paterson, MS, Exercise Physiologist;Shatora Weatherbee Vita RN,BSN;Joseph [facility] BS, Exercise Physiologist    Virtual Visit No    Medication changes reported     No    Fall or balance concerns reported    No    Tobacco Cessation No Change    Warm-up and Cool-down Performed on first and last piece of equipment    Resistance Training Performed Yes    VAD Patient? No    PAD/SET Patient? No      Pain Assessment   Currently in Pain? No/denies             Social History   Tobacco Use  Smoking Status Former   Current packs/day: 0.00   Average packs/day: 0.3 packs/day for 50.0 years (12.5 ttl pk-yrs)   Types: Cigarettes   Start date: 01/10/1972   Quit date: 01/09/2022   Years since quitting: 2.2  Smokeless Tobacco Never    Goals Met:  Proper associated with RPD/PD & O2 Sat Independence with exercise equipment Using PLB without cueing & demonstrates good technique Exercise tolerated well No report of concerns or symptoms today Strength training completed today  Goals Unmet:  Not Applicable  Comments:  [name] graduated today from  rehab with 36 sessions completed.  Details of the patient's exercise prescription and what He needs to do in order to continue the prescription and progress were discussed with patient.  Patient was given a copy of prescription and goals.  Patient verbalized understanding. [name] plans to continue to exercise  by walking and doing resistance/weights.    Dr. Oneil Pinal is Medical Director for [facility] Cardiac Rehabilitation.  Dr. Fuad Aleskerov is Medical Director for [facility] Pulmonary Rehabilitation.

## 2024-03-28 NOTE — Progress Notes (Signed)
 Pulmonary Individual Treatment Plan  Patient Details  Name: Taylor Chambers MRN: [id number] Date of Birth: August 09, 1945 Referring Provider:   Conrad Ports Pulmonary Rehab from 11/09/2023 in [facility] Cardiac and Pulmonary Rehab  Referring Provider Dr. Halina Picking, MD    Initial Encounter Date:  Flowsheet Row Pulmonary Rehab from 11/09/2023 in [facility] Cardiac and Pulmonary Rehab  Date 11/09/23    Visit Diagnosis: Chronic obstructive pulmonary disease, unspecified COPD type (HCC)  Patient's Home Medications on Admission:  Current Outpatient Medications:    albuterol  (VENTOLIN  HFA) 108 (90 Base) MCG/ACT inhaler, Inhale 2 puffs into the lungs every 6 (six) hours as needed for wheezing or shortness of breath., Disp: 8 g, Rfl: 2   apixaban  (ELIQUIS ) 5 MG TABS tablet, TAKE 1 TABLET BY MOUTH TWICE A DAY, Disp: 180 tablet, Rfl: 1   budesonide  (PULMICORT ) 0.25 MG/2ML nebulizer solution, SMARTSIG:2 Milliliter(s) Twice Daily (Patient not taking: Reported on 12/19/2023), Disp: , Rfl:    Calcium Carbonate (CALCIUM 500 PO), Take 500 mg by mouth daily at 12 noon. (Patient not taking: Reported on 12/19/2023), Disp: , Rfl:    Cholecalciferol  (VITAMIN D3) 1.25 MG (50000 UT) CAPS, Take 5,000 Units by mouth daily in the afternoon., Disp: , Rfl:    COMBIVENT  RESPIMAT 20-100 MCG/ACT AERS respimat, Inhale into the lungs. (Patient not taking: Reported on 12/19/2023), Disp: , Rfl:    cyclobenzaprine (FLEXERIL) 10 MG tablet, Take 10 mg by mouth 2 (two) times daily as needed for muscle spasms. (Patient not taking: Reported on 12/19/2023), Disp: , Rfl:    divalproex  (DEPAKOTE ) 250 MG DR tablet, Take 250 mg by mouth., Disp: , Rfl:    Dupilumab  300 MG/2ML SOAJ, Inject 300 mg into the skin every 14 (fourteen) days. Saturday (Patient not taking: Reported on 12/19/2023), Disp: , Rfl:    DUPIXENT  300 MG/2ML prefilled syringe, Inject 300 mg into the skin., Disp: , Rfl:    EPINEPHrine 0.3 mg/0.3 mL IJ SOAJ injection, Inject 0.3 mg into  the muscle as needed for anaphylaxis., Disp: , Rfl:    furosemide  (LASIX ) 40 MG tablet, Take 1 tablet (40 mg total) by mouth daily. (Patient not taking: Reported on 12/19/2023), Disp: 30 tablet, Rfl: 0   ipratropium (ATROVENT ) 0.06 % nasal spray, Place 1 spray into both nostrils daily., Disp: , Rfl:    ipratropium-albuterol  (DUONEB) 0.5-2.5 (3) MG/3ML SOLN, Take 3 mLs by nebulization 2 (two) times daily. Mix with budesonide  (Patient not taking: Reported on 12/19/2023), Disp: , Rfl:    losartan  (COZAAR ) 100 MG tablet, Take 1 tablet (100 mg total) by mouth daily., Disp: 90 tablet, Rfl: 3   metoprolol  succinate (TOPROL -XL) 25 MG 24 hr tablet, Take 1 tablet (25 mg total) by mouth 2 (two) times daily. (Patient not taking: Reported on 12/19/2023), Disp: 60 tablet, Rfl: 0   Multiple Vitamin (MULTI-VITAMIN) tablet, Take 1 tablet by mouth daily., Disp: , Rfl:    Multiple Vitamins-Minerals (PRESERVISION AREDS 2+MULTI VIT PO), Take 2 tablets by mouth daily. (Patient not taking: Reported on 12/19/2023), Disp: , Rfl:    OHTUVAYRE  3 MG/2.5ML SUSP, Take 3 mg by nebulization daily. (Patient not taking: Reported on 12/19/2023), Disp: , Rfl:    omeprazole (PRILOSEC) 40 MG capsule, Take 40 mg by mouth daily as needed (Heartburn). (Patient not taking: Reported on 12/19/2023), Disp: , Rfl:    oxybutynin (DITROPAN) 5 MG tablet, Take 5 mg by mouth. (Patient not taking: Reported on 12/19/2023), Disp: , Rfl:    Potassium Chloride  40 MEQ/15ML (20%) SOLN, Take  20 mEq by mouth. (Patient not taking: Reported on 12/19/2023), Disp: , Rfl:    potassium chloride  SA (KLOR-CON  M) 20 MEQ tablet, Take 1 tablet (20 mEq total) by mouth daily. (Patient not taking: Reported on 12/19/2023), Disp: 90 tablet, Rfl: 3   pravastatin  (PRAVACHOL ) 10 MG tablet, Take 10 mg by mouth daily., Disp: , Rfl:    sertraline  (ZOLOFT ) 100 MG tablet, Take 1 tablet (100 mg total) by mouth daily. (Patient not taking: Reported on 12/19/2023), Disp: 30 tablet, Rfl: 11    sulfamethoxazole -trimethoprim  (BACTRIM ) 400-80 MG tablet, Take 1 tablet by mouth 3 (three) times a week., Disp: , Rfl:   Past Medical History: Past Medical History:  Diagnosis Date   Allergies    Anxiety    Aortic atherosclerosis    Bipolar affective disorder (HCC)    COPD (chronic obstructive pulmonary disease) (HCC)    Coronary artery calcification seen on CT scan    Depression    Diastolic dysfunction 02/24/2020   a.) TTE 02/24/2020: EF >55%; triv PR, mild TR, mod MR; G1DD.   DOE (dyspnea on exertion)    Emphysema lung (HCC)    History of cataract    HLD (hyperlipidemia)    Hypertension    Macular degeneration    Migraines    Osteoporosis    Pneumonia    Tobacco use     Tobacco Use: Social History   Tobacco Use  Smoking Status Former   Current packs/day: 0.00   Average packs/day: 0.3 packs/day for 50.0 years (12.5 ttl pk-yrs)   Types: Cigarettes   Start date: 01/10/1972   Quit date: 01/09/2022   Years since quitting: 2.2  Smokeless Tobacco Never    Labs: Review Flowsheet  More data exists      Latest Ref Rng & Units 06/27/2023 06/29/2023 07/02/2023 07/17/2023 08/21/2023  Labs for ITP Cardiac and Pulmonary Rehab  PH, Arterial 7.35 - 7.45 7.44  - 7.6  7.4  -  PCO2 arterial 32 - 48 mmHg 46  - 47  33  -  Bicarbonate 20.0 - 28.0 mmol/L 31.2  26.8  26.6  46.1  20.4  25.0   Acid-base deficit 0.0 - 2.0 mmol/L - 0.1  4.5  - 3.6  3.1   O2 Saturation % 99.9  95  55.1  97.1  89.2  90.4     Details       Multiple values from one day are sorted in reverse-chronological order          Pulmonary Assessment Scores:  Pulmonary Assessment Scores     Row Name 11/09/23 1110 11/16/23 0936 03/13/24 0954     ADL UCSD   ADL Phase -- Entry --   SOB Score total -- 53 --   Rest -- 1 --   Walk -- 0 --   Stairs -- 5 --   Bath -- 0 --   Dress -- 0 --   Shop -- 2 --     CAT Score   CAT Score -- 19 --     mMRC Score   mMRC Score 2 2 4     Row Name 03/14/24 1011          ADL UCSD   SOB Score total 53     Rest 0     Walk 1     Stairs 5     Bath 0     Dress 0     Shop 4       CAT Score  CAT Score 16        UCSD: Self-administered rating of dyspnea associated with activities of daily living (ADLs) 6-point scale (0 = not at all to 5 = maximal or unable to do because of breathlessness)  Scoring Scores range from 0 to 120.  Minimally important difference is 5 units  CAT: CAT can identify the health impairment of COPD patients and is better correlated with disease progression.  CAT has a scoring range of zero to 40. The CAT score is classified into four groups of low (less than 10), medium (10 - 20), high (21-30) and very high (31-40) based on the impact level of disease on health status. A CAT score over 10 suggests significant symptoms.  A worsening CAT score could be explained by an exacerbation, poor medication adherence, poor inhaler technique, or progression of COPD or comorbid conditions.  CAT MCID is 2 points  mMRC: mMRC (Modified Medical Research Council) Dyspnea Scale is used to assess the degree of baseline functional disability in patients of respiratory disease due to dyspnea. No minimal important difference is established. A decrease in score of 1 point or greater is considered a positive change.   Pulmonary Function Assessment:  Pulmonary Function Assessment - 11/08/23 1429       Breath   Shortness of Breath Yes;Fear of Shortness of Breath;Limiting activity;Panic with Shortness of Breath          Exercise Target Goals: Exercise Program Goal: Individual exercise prescription set using results from initial 6 min walk test and THRR while considering  patient's activity barriers and safety.   Exercise Prescription Goal: Initial exercise prescription builds to 30-45 minutes a day of aerobic activity, 2-3 days per week.  Home exercise guidelines will be given to patient during program as part of exercise prescription that the  participant will acknowledge.  Education: Aerobic Exercise: - Group verbal and visual presentation on the components of exercise prescription. Introduces F.I.T.T principle from ACSM for exercise prescriptions.  Reviews F.I.T.T. principles of aerobic exercise including progression. Written material provided at class time.   Education: Resistance Exercise: - Group verbal and visual presentation on the components of exercise prescription. Introduces F.I.T.T principle from ACSM for exercise prescriptions  Reviews F.I.T.T. principles of resistance exercise including progression. Written material provided at class time.    Education: Exercise & Equipment Safety: - Individual verbal instruction and demonstration of equipment use and safety with use of the equipment. Flowsheet Row Pulmonary Rehab from 11/08/2023 in [facility] Cardiac and Pulmonary Rehab  Date 11/08/23  Educator [facility]  Instruction Review Code 1- Verbalizes Understanding    Education: Exercise Physiology & General Exercise Guidelines: - Group verbal and written instruction with models to review the exercise physiology of the cardiovascular system and associated critical values. Provides general exercise guidelines with specific guidelines to those with heart or lung disease.    Education: Flexibility, Balance, Mind/Body Relaxation: - Group verbal and visual presentation with interactive activity on the components of exercise prescription. Introduces F.I.T.T principle from ACSM for exercise prescriptions. Reviews F.I.T.T. principles of flexibility and balance exercise training including progression. Also discusses the mind body connection.  Reviews various relaxation techniques to help reduce and manage stress (i.e. Deep breathing, progressive muscle relaxation, and visualization). Balance handout provided to take home. Written material provided at class time.   Activity Barriers & Risk Stratification:  Activity Barriers & Cardiac Risk  Stratification - 11/09/23 1123       Activity Barriers & Cardiac Risk Stratification   Activity  Barriers Right Hip Replacement;Other (comment);Assistive Device;Shortness of Breath;History of Falls;Balance Concerns    Comments osteoporosis          6 Minute Walk:  6 Minute Walk     Row Name 11/09/23 1117 03/13/24 0949       6 Minute Walk   Phase Initial Discharge    Distance 455 feet 900 feet    Distance % Change -- 97.8 %    Distance Feet Change -- 445 ft    Walk Time 6 minutes 6 minutes    # of Rest Breaks 0 0    MPH 0.86 1.7    METS 1.68 2.37    RPE 15 12    Perceived Dyspnea  4 1    VO2 Peak 5.88 8.31    Symptoms Yes (comment) Yes (comment)    Comments used walker used walker    Resting HR 85 bpm 77 bpm    Resting BP 132/68 158/74    Resting Oxygen Saturation  95 % 92 %    Exercise Oxygen Saturation  during 6 min walk 90 % 90 %    Max Ex. HR 105 bpm 92 bpm    Max Ex. BP 154/74 170/72    2 Minute Post BP 142/72 140/68      Interval HR   1 Minute HR 102 85    2 Minute HR 105 92    3 Minute HR 104 90    4 Minute HR 105 87    5 Minute HR 101 88    6 Minute HR 98 92    2 Minute Post HR 93 79    Interval Heart Rate? Yes Yes      Interval Oxygen   Interval Oxygen? Yes Yes    Baseline Oxygen Saturation % 95 % 92 %    1 Minute Oxygen Saturation % 91 % 91 %    1 Minute Liters of Oxygen 3 L  pulsed 3 L    2 Minute Oxygen Saturation % 92 % 92 %    2 Minute Liters of Oxygen 3 L 3 L    3 Minute Oxygen Saturation % 91 % 93 %    3 Minute Liters of Oxygen 3 L 3 L    4 Minute Oxygen Saturation % 90 % 90 %    4 Minute Liters of Oxygen 3 L 3 L    5 Minute Oxygen Saturation % 91 % 91 %    5 Minute Liters of Oxygen 3 L 3 L    6 Minute Oxygen Saturation % 91 % 90 %    6 Minute Liters of Oxygen 3 L 3 L    2 Minute Post Oxygen Saturation % 95 % 95 %    2 Minute Post Liters of Oxygen 3 L 3 L      Oxygen Initial Assessment:  Oxygen Initial Assessment - 11/08/23 1428        Home Oxygen   Home Oxygen Device None    Sleep Oxygen Prescription Continuous;BiPAP    Liters per minute 3    Home Exercise Oxygen Prescription None    Home Resting Oxygen Prescription None    Compliance with Home Oxygen Use Yes      Initial 6 min Walk   Oxygen Used None      Program Oxygen Prescription   Program Oxygen Prescription None      Intervention   Short Term Goals To learn and demonstrate proper pursed lip  breathing techniques or other breathing techniques. ;To learn and understand importance of monitoring SPO2 with pulse oximeter and demonstrate accurate use of the pulse oximeter.;To learn and exhibit compliance with exercise, home and travel O2 prescription;To learn and understand importance of maintaining oxygen saturations>88%    Long  Term Goals Verbalizes importance of monitoring SPO2 with pulse oximeter and return demonstration;Exhibits proper breathing techniques, such as pursed lip breathing or other method taught during program session;Demonstrates proper use of MDI's;Exhibits compliance with exercise, home  and travel O2 prescription;Maintenance of O2 saturations>88%;Compliance with respiratory medication          Oxygen Re-Evaluation:  Oxygen Re-Evaluation     Row Name 11/16/23 0946 12/14/23 0948 01/17/24 0930 02/08/24 1002 03/21/24 1006     Program Oxygen Prescription   Program Oxygen Prescription None Continuous;E-Tanks Continuous;E-Tanks Continuous;E-Tanks Continuous;E-Tanks   Liters per minute -- 3 3 3 3      Home Oxygen   Home Oxygen Device None E-Tanks;Home Concentrator;Portable Concentrator E-Tanks;Home Concentrator;Portable Concentrator E-Tanks;Home Concentrator;Portable Concentrator Home Concentrator;Portable Concentrator;E-Tanks   Sleep Oxygen Prescription Continuous;BiPAP BiPAP BiPAP BiPAP BiPAP   Liters per minute 3 3 3 3 3    Home Exercise Oxygen Prescription None Continuous Continuous Continuous Continuous   Liters per minute -- 3 3 3 3    Home  Resting Oxygen Prescription None None None None None   Compliance with Home Oxygen Use Yes Yes Yes Yes Yes     Goals/Expected Outcomes   Short Term Goals To learn and demonstrate proper pursed lip breathing techniques or other breathing techniques.  To learn and demonstrate proper pursed lip breathing techniques or other breathing techniques.  To learn and understand importance of maintaining oxygen saturations>88%;To learn and understand importance of monitoring SPO2 with pulse oximeter and demonstrate accurate use of the pulse oximeter. Other;To learn and demonstrate proper pursed lip breathing techniques or other breathing techniques.  To learn and demonstrate proper pursed lip breathing techniques or other breathing techniques.    Long  Term Goals Exhibits proper breathing techniques, such as pursed lip breathing or other method taught during program session Exhibits proper breathing techniques, such as pursed lip breathing or other method taught during program session Maintenance of O2 saturations>88%;Verbalizes importance of monitoring SPO2 with pulse oximeter and return demonstration Other;Exhibits proper breathing techniques, such as pursed lip breathing or other method taught during program session Exhibits proper breathing techniques, such as pursed lip breathing or other method taught during program session   Comments Reviewed PLB technique with pt.  Talked about how it works and it's importance in maintaining their exercise saturations. Informed patient how to perform the Pursed Lipped breathing technique. Told patient to Inhale through the nose and out the mouth with pursed lips to keep their airways open, help oxygenate them better, practice when at rest or doing strenuous activity. Patient Verbalizes understanding of technique and will work on and be reiterated during [facility]. Briseis has a pulse oximeter to check her oxygen saturation at home. Informed and explained why it is important to have  one. Reviewed that oxygen saturations should be 88 percent and above. Diaphragmatic and PLB breathing explained and performed with patient. Patient has a better understanding of how to do these exercises to help with breathing performance and relaxation. Patient performed breathing techniques adequately and to practice further at home. Spoke with Kristle abut PLB techinques when exerting herself. Encouraged her to monitor her O2 saturations post rehab graduation   Goals/Expected Outcomes Short: Become more profiecient at using PLB. Long: Become  independent at using PLB. Short: use PLB with exertion. Long: use PLB on exertion proficiently and independently. Short: monitor oxygen at home with exertion. Long: maintain oxygen saturations above 88 percent independently. Short: Practice PLB and diaphragmatic breathing at home. Long: Use PLB and diaphragmatic breathing independently post [facility]. STG: Practice PLB and diaphragmatic breathing at home. LTG: Use PLB and diaphragmatic breathing independently post [facility]      Oxygen Discharge (Final Oxygen Re-Evaluation):  Oxygen Re-Evaluation - 03/21/24 1006       Program Oxygen Prescription   Program Oxygen Prescription Continuous;E-Tanks    Liters per minute 3      Home Oxygen   Home Oxygen Device Home Concentrator;Portable Concentrator;E-Tanks    Sleep Oxygen Prescription BiPAP    Liters per minute 3    Home Exercise Oxygen Prescription Continuous    Liters per minute 3    Home Resting Oxygen Prescription None    Compliance with Home Oxygen Use Yes      Goals/Expected Outcomes   Short Term Goals To learn and demonstrate proper pursed lip breathing techniques or other breathing techniques.     Long  Term Goals Exhibits proper breathing techniques, such as pursed lip breathing or other method taught during program session    Comments Spoke with Daly abut PLB techinques when exerting herself. Encouraged her to monitor her O2 saturations post rehab  graduation    Goals/Expected Outcomes STG: Practice PLB and diaphragmatic breathing at home. LTG: Use PLB and diaphragmatic breathing independently post [facility]          Initial Exercise Prescription:  Initial Exercise Prescription - 11/09/23 1100       Date of Initial Exercise RX and Referring Provider   Date 11/09/23    Referring Provider Dr. Fuad Aleskerov, MD      Oxygen   Oxygen Continuous    Liters 3    Maintain Oxygen Saturation 88% or higher      Recumbant Bike   Level 1    RPM 50    Watts 15    Minutes 15    METs 1.68      NuStep   Level 1    SPM 80    Minutes 15    METs 1.68      Track   Laps 12    Minutes 15    METs 1.65      Prescription Details   Frequency (times per week) 2    Duration Progress to 30 minutes of continuous aerobic without signs/symptoms of physical distress      Intensity   THRR 40-80% of Max Heartrate 108-132    Ratings of Perceived Exertion 11-13    Perceived Dyspnea 0-4      Progression   Progression Continue to progress workloads to maintain intensity without signs/symptoms of physical distress.      Resistance Training   Training Prescription Yes    Weight 2 lb    Reps 10-15          Perform Capillary Blood Glucose checks as needed.  Exercise Prescription Changes:   Exercise Prescription Changes     Row Name 11/09/23 1100 11/21/23 1400 12/06/23 1300 12/12/23 1000 12/20/23 1600     Response to Exercise   Blood Pressure (Admit) 132/68 132/70 132/60 -- 118/64   Blood Pressure (Exercise) 154/74 148/68 156/64 -- 160/70   Blood Pressure (Exit) 142/72 126/68 120/66 -- 128/60   Heart Rate (Admit) 85 bpm 74 bpm 81 bpm -- 95 bpm  Heart Rate (Exercise) 105 bpm 98 bpm 110 bpm -- 103 bpm   Heart Rate (Exit) 93 bpm 90 bpm 72 bpm -- 87 bpm   Oxygen Saturation (Admit) 95 % 98 % 96 % -- 90 %   Oxygen Saturation (Exercise) 90 % 92 % 89 % -- 98 %   Oxygen Saturation (Exit) 95 % 93 % 91 % -- 94 %   Rating of Perceived  Exertion (Exercise) 15 15 14  -- 15   Perceived Dyspnea (Exercise) 4 3 2  -- 3   Symptoms used walker none none -- none   Comments Results 1st day of exercise -- -- --   Duration -- Continue with 30 min of aerobic exercise without signs/symptoms of physical distress. Continue with 30 min of aerobic exercise without signs/symptoms of physical distress. -- Continue with 30 min of aerobic exercise without signs/symptoms of physical distress.   Intensity -- THRR unchanged THRR unchanged -- THRR unchanged     Progression   Progression -- Continue to progress workloads to maintain intensity without signs/symptoms of physical distress. Continue to progress workloads to maintain intensity without signs/symptoms of physical distress. -- Continue to progress workloads to maintain intensity without signs/symptoms of physical distress.   Average METs -- 2.4 1.81 -- 1.48     Resistance Training   Training Prescription -- Yes Yes -- Yes   Weight -- 2 lb 2 lb -- 2 lb   Reps -- 10-15 10-15 -- 10-15     Interval Training   Interval Training -- No No -- No     Oxygen   Oxygen -- Continuous Continuous -- Continuous   Liters -- 2 2 -- 2-3     Recumbant Bike   Level -- 1 -- -- --   Watts -- 15 -- -- --   Minutes -- 15 -- -- --   METs -- 2.99 -- -- --     NuStep   Level -- 1 2 -- 1   Minutes -- 15 15 -- 15   METs -- 1.8 1.6 -- 1.5     Arm Ergometer   Level -- -- -- -- 1   Minutes -- -- -- -- 15   METs -- -- -- -- 1     Track   Laps -- -- 32 -- 24   Minutes -- -- 15 -- 15   METs -- -- 2.74 -- 2.31     Home Exercise Plan   Plans to continue exercise at -- -- -- Home (comment)  walking, resistance w/ weights, keeping up with 17 year old grandbaby Home (comment)  walking, resistance w/ weights, keeping up with 68 year old grandbaby   Frequency -- -- -- Add 3 additional days to program exercise sessions. Add 3 additional days to program exercise sessions.   Initial Home Exercises Provided -- --  -- 12/12/23 12/12/23     Oxygen   Maintain Oxygen Saturation -- 88% or higher 88% or higher -- 88% or higher    Row Name 01/02/24 1400 01/16/24 1500 01/31/24 1400 02/15/24 1800 02/29/24 1600     Response to Exercise   Blood Pressure (Admit) 124/70 136/64 122/68 136/6 140/64   Blood Pressure (Exercise) -- 164/62 -- -- --   Blood Pressure (Exit) 130/60 134/58 128/78 130/68 144/60   Heart Rate (Admit) 97 bpm 87 bpm 91 bpm 93 bpm 95 bpm   Heart Rate (Exercise) 111 bpm 105 bpm 104 bpm 103 bpm 105 bpm   Heart  Rate (Exit) 95 bpm 84 bpm 78 bpm 87 bpm 81 bpm   Oxygen Saturation (Admit) 93 % 95 % 92 % 89 % 89 %   Oxygen Saturation (Exercise) 88 % 88 % 86 % 86 %  came up to 88 with rest 88 %   Oxygen Saturation (Exit) 93 % 90 % 96 % 94 % 94 %   Rating of Perceived Exertion (Exercise) 17 14 15 13 13    Perceived Dyspnea (Exercise) 4 2 2 3 3    Symptoms none none none none none   Duration Continue with 30 min of aerobic exercise without signs/symptoms of physical distress. Continue with 30 min of aerobic exercise without signs/symptoms of physical distress. Continue with 30 min of aerobic exercise without signs/symptoms of physical distress. Continue with 30 min of aerobic exercise without signs/symptoms of physical distress. Continue with 30 min of aerobic exercise without signs/symptoms of physical distress.   Intensity THRR unchanged THRR unchanged THRR unchanged THRR unchanged THRR unchanged     Progression   Progression Continue to progress workloads to maintain intensity without signs/symptoms of physical distress. Continue to progress workloads to maintain intensity without signs/symptoms of physical distress. Continue to progress workloads to maintain intensity without signs/symptoms of physical distress. Continue to progress workloads to maintain intensity without signs/symptoms of physical distress. Continue to progress workloads to maintain intensity without signs/symptoms of physical distress.    Average METs 2.04 2.21 2.03 1.95 2.27     Resistance Training   Training Prescription Yes Yes Yes Yes Yes   Weight 2 lb 2 lb 2 lb 2 lb 2 lb   Reps 10-15 10-15 10-15 10-15 10-15     Interval Training   Interval Training No No No No No     Oxygen   Oxygen Continuous Continuous Continuous Continuous Continuous   Liters 3 3 3  3-4 2     NuStep   Level 1 1 2 3 5    Minutes 15 15 15 15 15    METs 1.6 1.6 2 1.6 2     Track   Laps 52 52 42 30 44   Minutes 15 15 15 15 15    METs 3.8 3.83 3.28 2.63 3.39     Home Exercise Plan   Plans to continue exercise at Home (comment)  walking, resistance w/ weights, keeping up with 5 year old grandbaby Home (comment)  walking, resistance w/ weights, keeping up with 24 year old grandbaby Home (comment)  walking, resistance w/ weights, keeping up with 50 year old grandbaby Home (comment)  walking, resistance w/ weights, keeping up with 1 year old grandbaby Home (comment)  walking, resistance w/ weights, keeping up with 65 year old grandbaby   Frequency Add 3 additional days to program exercise sessions. Add 3 additional days to program exercise sessions. Add 3 additional days to program exercise sessions. Add 3 additional days to program exercise sessions. Add 3 additional days to program exercise sessions.   Initial Home Exercises Provided 12/12/23 12/12/23 12/12/23 12/12/23 12/12/23     Oxygen   Maintain Oxygen Saturation 88% or higher 88% or higher 88% or higher 88% or higher 88% or higher    Row Name 03/11/24 1100 03/28/24 0700           Response to Exercise   Blood Pressure (Admit) 138/64 146/64      Blood Pressure (Exit) 118/56 120/70      Heart Rate (Admit) 74 bpm 82 bpm      Heart Rate (Exercise)  97 bpm 103 bpm      Heart Rate (Exit) 77 bpm 86 bpm      Oxygen Saturation (Admit) 90 % 89 %      Oxygen Saturation (Exercise) 87 % 88 %      Oxygen Saturation (Exit) 98 % 90 %      Rating of Perceived Exertion (Exercise) 14 13      Perceived  Dyspnea (Exercise) 3 3      Symptoms none none      Duration Continue with 30 min of aerobic exercise without signs/symptoms of physical distress. Continue with 30 min of aerobic exercise without signs/symptoms of physical distress.      Intensity THRR unchanged THRR unchanged        Progression   Progression Continue to progress workloads to maintain intensity without signs/symptoms of physical distress. Continue to progress workloads to maintain intensity without signs/symptoms of physical distress.      Average METs 1.8 2.16        Resistance Training   Training Prescription Yes Yes      Weight 2 lb 2 lb      Reps 10-15 10-15        Interval Training   Interval Training No No        Oxygen   Oxygen Continuous Continuous      Liters 3 3        NuStep   Level 3 3      Minutes 15 15      METs 1.5 2        Track   Laps 35 48      Minutes 15 15      METs 2.9 3.61        Home Exercise Plan   Plans to continue exercise at Home (comment)  walking, resistance w/ weights, keeping up with 50 year old grandbaby Home (comment)  walking, resistance w/ weights, keeping up with 33 year old grandbaby      Frequency Add 3 additional days to program exercise sessions. Add 3 additional days to program exercise sessions.      Initial Home Exercises Provided 12/12/23 12/12/23        Oxygen   Maintain Oxygen Saturation 88% or higher 88% or higher         Exercise Comments:   Exercise Comments     Row Name 11/16/23 0945           Exercise Comments First full day of exercise!  Patient was oriented to gym and equipment including functions, settings, policies, and procedures.  Patient's individual exercise prescription and treatment plan were reviewed.  All starting workloads were established based on the results of the 6 minute walk test done at initial orientation visit.  The plan for exercise progression was also introduced and progression will be customized based on patient's performance  and goals.          Exercise Goals and Review:   Exercise Goals     Row Name 11/09/23 1124             Exercise Goals   Increase Physical Activity Yes       Intervention Provide advice, education, support and counseling about physical activity/exercise needs.;Develop an individualized exercise prescription for aerobic and resistive training based on initial evaluation findings, risk stratification, comorbidities and participant's personal goals.       Expected Outcomes Short Term: Attend rehab on a regular basis to increase amount of physical  activity.;Long Term: Exercising regularly at least 3-5 days a week.;Long Term: Add in home exercise to make exercise part of routine and to increase amount of physical activity.       Increase Strength and Stamina Yes       Intervention Develop an individualized exercise prescription for aerobic and resistive training based on initial evaluation findings, risk stratification, comorbidities and participant's personal goals.;Provide advice, education, support and counseling about physical activity/exercise needs.       Expected Outcomes Short Term: Increase workloads from initial exercise prescription for resistance, speed, and METs.;Short Term: Perform resistance training exercises routinely during rehab and add in resistance training at home;Long Term: Improve cardiorespiratory fitness, muscular endurance and strength as measured by increased METs and functional capacity ( )       Able to understand and use rate of perceived exertion (RPE) scale Yes       Intervention Provide education and explanation on how to use RPE scale       Expected Outcomes Short Term: Able to use RPE daily in rehab to express subjective intensity level;Long Term:  Able to use RPE to guide intensity level when exercising independently       Able to understand and use Dyspnea scale Yes       Intervention Provide education and explanation on how to use Dyspnea scale        Expected Outcomes Short Term: Able to use Dyspnea scale daily in rehab to express subjective sense of shortness of breath during exertion;Long Term: Able to use Dyspnea scale to guide intensity level when exercising independently       Knowledge and understanding of Target Heart Rate Range (THRR) Yes       Intervention Provide education and explanation of THRR including how the numbers were predicted and where they are located for reference       Expected Outcomes Short Term: Able to state/look up THRR;Long Term: Able to use THRR to govern intensity when exercising independently;Short Term: Able to use daily as guideline for intensity in rehab       Able to check pulse independently Yes       Intervention Provide education and demonstration on how to check pulse in carotid and radial arteries.;Review the importance of being able to check your own pulse for safety during independent exercise       Expected Outcomes Short Term: Able to explain why pulse checking is important during independent exercise;Long Term: Able to check pulse independently and accurately       Understanding of Exercise Prescription Yes       Intervention Provide education, explanation, and written materials on patient's individual exercise prescription       Expected Outcomes Short Term: Able to explain program exercise prescription;Long Term: Able to explain home exercise prescription to exercise independently          Exercise Goals Re-Evaluation :  Exercise Goals Re-Evaluation     Row Name 11/16/23 0945 11/21/23 1450 12/06/23 1350 12/12/23 1033 12/20/23 1621     Exercise Goal Re-Evaluation   Exercise Goals Review Able to understand and use rate of perceived exertion (RPE) scale;Knowledge and understanding of Target Heart Rate Range (THRR);Able to understand and use Dyspnea scale;Understanding of Exercise Prescription Increase Physical Activity;Understanding of Exercise Prescription;Increase Strength and Stamina Increase  Physical Activity;Understanding of Exercise Prescription;Increase Strength and Stamina Increase Physical Activity;Able to understand and use rate of perceived exertion (RPE) scale;Knowledge and understanding of Target Heart Rate Range (THRR);Understanding of Exercise Prescription;Increase  Strength and Stamina;Able to understand and use Dyspnea scale;Able to check pulse independently Increase Physical Activity;Understanding of Exercise Prescription;Increase Strength and Stamina   Comments eviewed RPE and dyspnea scale, THR and program prescription with pt today.  Pt voiced understanding and was given a copy of goals to take home. Cydney is off to a good start in the program and completed her first day in this review. She tolerated her exercise prescription well with level 1 on the T4 nustep and level 1 on the recumbent bike. She used 2 lb weights for resistance. We will continue to monitor her progress in the program. Rashawna is doing well in rehab. She was recently able to increase from level 1 to 2 on the T4 nustep. She was also able to walk 32 laps on the track. We will continue to monitor her progress in the program. Reviewed home exercise with pt today from 9:25 to 9:35am.  Pt plans to walk for aerobic and use weights for resistance at home and keep up with her 21 year old great grandbaby for exercise. She plans to add 3 additional days at home. Reviewed THR, pulse, RPE, sign and symptoms, pulse oximetery and when to call 911 or MD.  Also discussed weather considerations and indoor options.  Pt voiced understanding. Mikaelah is doing well in rehab. She was able to use the arm ergometer at level 1. She worked at level 1 on the T4 nustep and walked 24 laps on the track. We will continue to monitor her progress in the program.   Expected Outcomes Short: Use RPE daily to regulate intensity. Long: Follow program prescription in THR. Short: Continue to follow current exercise prescription. Long: Continue exercise to  improve strength and stamina. Short: Continue to follow current exercise prescription. Long: Continue exercise to improve strength and stamina. Short: Add 3 additional days of exercise at home. Long: Continue to exercise at home independently. Short: Try level 2 on the T4 nustep and push for more laps on the track. Long: Continue exercise to improve strength and stamina.    Row Name 01/02/24 1500 01/16/24 1524 01/31/24 1411 02/08/24 1004 02/15/24 1825     Exercise Goal Re-Evaluation   Exercise Goals Review Increase Physical Activity;Understanding of Exercise Prescription;Increase Strength and Stamina Increase Physical Activity;Understanding of Exercise Prescription;Increase Strength and Stamina Increase Physical Activity;Understanding of Exercise Prescription;Increase Strength and Stamina Increase Physical Activity;Increase Strength and Stamina;Understanding of Exercise Prescription Increase Physical Activity;Increase Strength and Stamina;Understanding of Exercise Prescription   Comments Catelin is doing well in rehab. She increased her laps on the track to 30 and 52 laps. She maintained level 1 on the T4 nustep. We will continue to monitor her progress in the program. Samariyah continues to do well in rehab. She has continued to walk 52 laps on the track. She also has maintained her workload at level 1 on the T4 nustep. We will continue to monitor her progress in the program. Philena is doing well in the program. She continues to walk at least a mile on the track in 15 minutes. She was also able to increase from level 1 to 2 on the T4 nustep. We will continue to monitor her progress in the program. Annebelle states she is going to increase her stamina by turning up her levels on her machines. She is doing well with walking the track and increasing her distance. Maricel is doing well in rehab. She increased to level 3 on the T4 nustep. She decreased her laps on the track  slightly to 30 laps from 42. We will continue to  monitor her progress in the program.   Expected Outcomes Short: Try level 2 on the T4 nustep. Long: Continue exercise to improve strength and stamina. Short: Increase to level 2 on the T4 nustep. Long: Continue exercise to improve strength and stamina. Short: Continue to increase level on T4 nustep. Long: Continue exercise to improve strength and stamina. Short: increase levels on all machines. Long: continue exercise independently. Short: Push for more laps on the track to get back to 42. Long: Continue exercise to improve strength and stamina.    Row Name 02/29/24 1615 03/11/24 1141 03/21/24 1004 03/28/24 0758       Exercise Goal Re-Evaluation   Exercise Goals Review Increase Physical Activity;Increase Strength and Stamina;Understanding of Exercise Prescription Increase Physical Activity;Increase Strength and Stamina;Understanding of Exercise Prescription Increase Physical Activity;Increase Strength and Stamina;Understanding of Exercise Prescription Increase Physical Activity;Increase Strength and Stamina;Understanding of Exercise Prescription    Comments Zoila continues to do well in rehab. She was recently able to increase her track laps from 30 to 44 laps. She was also able to increase from level 3 to 5 on the T4 nustep. We will continue to monitor her progress in the program. Katilyn is doing well in rehab. She was able to walk 35 laps on the track, and used the T4 nustep at level 3. We will continue to encourage and monitor her progress in the program. Jeanelle is doing well in rehab and is also active at home, playing with 78.77 year old, which she syas brings her joy and keeps her active Lorilee continues to do well in rehab and will graduate soon. She improved on her post by 97.8% with a total of 964ft, which is incredible! She was also able to increase her track laps to 48 laps. We will continue to monitor her progress in the program until graduation.    Expected Outcomes Short: Continue to increase  workloads on the T4 nustep. Long: Continue exercise to improve strength and stamina. Short: Improve on post-6MWT. Long: Continue to improve strength and stamina. STG: Graduate. LTG: Continue to exercise post rehab graduation Short: Graduate. Long: Continue to exercise independently.       Discharge Exercise Prescription (Final Exercise Prescription Changes):  Exercise Prescription Changes - 03/28/24 0700       Response to Exercise   Blood Pressure (Admit) 146/64    Blood Pressure (Exit) 120/70    Heart Rate (Admit) 82 bpm    Heart Rate (Exercise) 103 bpm    Heart Rate (Exit) 86 bpm    Oxygen Saturation (Admit) 89 %    Oxygen Saturation (Exercise) 88 %    Oxygen Saturation (Exit) 90 %    Rating of Perceived Exertion (Exercise) 13    Perceived Dyspnea (Exercise) 3    Symptoms none    Duration Continue with 30 min of aerobic exercise without signs/symptoms of physical distress.    Intensity THRR unchanged      Progression   Progression Continue to progress workloads to maintain intensity without signs/symptoms of physical distress.    Average METs 2.16      Resistance Training   Training Prescription Yes    Weight 2 lb    Reps 10-15      Interval Training   Interval Training No      Oxygen   Oxygen Continuous    Liters 3      NuStep   Level 3  Minutes 15    METs 2      Track   Laps 48    Minutes 15    METs 3.61      Home Exercise Plan   Plans to continue exercise at Home (comment)   walking, resistance w/ weights, keeping up with 80 year old grandbaby   Frequency Add 3 additional days to program exercise sessions.    Initial Home Exercises Provided 12/12/23      Oxygen   Maintain Oxygen Saturation 88% or higher          Nutrition:  Target Goals: Understanding of nutrition guidelines, daily intake of sodium 1500mg , cholesterol 200mg , calories 30% from fat and 7% or less from saturated fats, daily to have 5 or more servings of fruits and  vegetables.  Education: Nutrition 1 -Group instruction provided by verbal, written material, interactive activities, discussions, models, and posters to present general guidelines for heart healthy nutrition including macronutrients, label reading, and promoting whole foods over processed counterparts. Education serves as Pensions consultant of discussion of heart healthy eating for all. Written material provided at class time.     Education: Nutrition 2 -Group instruction provided by verbal, written material, interactive activities, discussions, models, and posters to present general guidelines for heart healthy nutrition including sodium, cholesterol, and saturated fat. Providing guidance of habit forming to improve blood pressure, cholesterol, and body weight. Written material provided at class time.     Biometrics:  Pre Biometrics - 11/09/23 1124       Pre Biometrics   Height 5' 2 (1.575 m)    Weight 100 lb 11.2 oz (45.7 kg)    Waist Circumference 26.5 inches    Hip Circumference 33.5 inches    Waist to Hip Ratio 0.79 %    BMI (Calculated) 18.41    Single Leg Stand 1.6 seconds          Post Biometrics - 03/13/24 0954        Post  Biometrics   Height 5' 2 (1.575 m)    Weight 99 lb 6.4 oz (45.1 kg)    Waist Circumference 27.7 inches    Hip Circumference 35 inches    Waist to Hip Ratio 0.79 %    BMI (Calculated) 18.18    Single Leg Stand 2 seconds          Nutrition Therapy Plan and Nutrition Goals:  Nutrition Therapy & Goals - 11/09/23 1133       Nutrition Therapy   RD appointment deferred Yes      Intervention Plan   Intervention Prescribe, educate and counsel regarding individualized specific dietary modifications aiming towards targeted core components such as weight, hypertension, lipid management, diabetes, heart failure and other comorbidities.    Expected Outcomes Short Term Goal: Understand basic principles of dietary content, such as calories, fat, sodium,  cholesterol and nutrients.;Short Term Goal: A plan has been developed with personal nutrition goals set during dietitian appointment.;Long Term Goal: Adherence to prescribed nutrition plan.          Nutrition Assessments:  MEDIFICTS Score Key: >=70 Need to make dietary changes  40-70 Heart Healthy Diet <= 40 Therapeutic Level Cholesterol Diet  Flowsheet Row Pulmonary Rehab from 03/14/2024 in [facility]  Picture Your Plate Total Score on Admission 53  Picture Your Plate Total Score on Discharge 44   Picture Your Plate Scores: <59 Unhealthy dietary pattern with much room for improvement. 41-50 Dietary pattern unlikely to meet recommendations for good  health and room for improvement. 51-60 More healthful dietary pattern, with some room for improvement.  >60 Healthy dietary pattern, although there may be some specific behaviors that could be improved.   Nutrition Goals Re-Evaluation:  Nutrition Goals Re-Evaluation     Row Name 12/14/23 9047 01/17/24 9071 02/08/24 1008 03/21/24 1011       Goals   Comment Patient was informed on why it is important to maintain a balanced diet when dealing with Respiratory issues. Explained that it takes a lot of energy to breath and when they are short of breath often they will need to have a good diet to help keep up with the calories they are expending for breathing. Patient deferred RD appointment. Patient deferred RD appointment. Patient deferred RD appointment.    Expected Outcome Short: Choose and plan snacks accordingly to patients caloric intake to improve breathing. Long: Maintain a diet independently that meets their caloric intake to aid in daily shortness of breath. -- -- --       Nutrition Goals Discharge (Final Nutrition Goals Re-Evaluation):  Nutrition Goals Re-Evaluation - 03/21/24 1011       Goals   Comment Patient deferred RD appointment.          Psychosocial: Target Goals: Acknowledge presence or  absence of significant depression and/or stress, maximize coping skills, provide positive support system. Participant is able to verbalize types and ability to use techniques and skills needed for reducing stress and depression.   Education: Stress, Anxiety, and Depression - Group verbal and visual presentation to define topics covered.  Reviews how body is impacted by stress, anxiety, and depression.  Also discusses healthy ways to reduce stress and to treat/manage anxiety and depression.  Written material provided at class time.   Education: Sleep Hygiene -Provides group verbal and written instruction about how sleep can affect your health.  Define sleep hygiene, discuss sleep cycles and impact of sleep habits. Review good sleep hygiene tips.    Initial Review & Psychosocial Screening:  Initial Psych Review & Screening - 11/08/23 1430       Initial Review   Current issues with Current Psychotropic Meds      Family Dynamics   Good Support System? Yes    Comments Keeley sometimes gets depressed because she cannot breatj at times. She is unable to things that she wants to due to her COPD and she takes Zoloft  to help. She can look to her husband and grandchildren for support.      Barriers   Psychosocial barriers to participate in program The patient should benefit from training in stress management and relaxation.;Psychosocial barriers identified (see note)      Screening Interventions   Interventions Encouraged to exercise;Provide feedback about the scores to participant;To provide support and resources with identified psychosocial needs    Expected Outcomes Short Term goal: Utilizing psychosocial counselor, staff and physician to assist with identification of specific Stressors or current issues interfering with healing process. Setting desired goal for each stressor or current issue identified.;Long Term Goal: Stressors or current issues are controlled or eliminated.;Short Term goal:  Identification and review with participant of any Quality of Life or Depression concerns found by scoring the questionnaire.;Long Term goal: The participant improves quality of Life and PHQ9 Scores as seen by post scores and/or verbalization of changes          Quality of Life Scores:  Scores of 19 and below usually indicate a poorer quality of life in these areas.  A difference of  2-3 points is a clinically meaningful difference.  A difference of 2-3 points in the total score of the Quality of Life Index has been associated with significant improvement in overall quality of life, self-image, physical symptoms, and general health in studies assessing change in quality of life.  PHQ-9: Review Flowsheet  More data may exist      03/21/2024 03/14/2024 02/08/2024 01/17/2024 11/09/2023  Depression screen PHQ 2/9  Decreased Interest 1 2 0 0 0  Down, Depressed, Hopeless 1 2 0 0 1  PHQ - 2 Score 2 4 0 0 1  Altered sleeping 1 2 0 0 3  Tired, decreased energy 2 3 1 3 3   Change in appetite 2 2 0 0 2  Feeling bad or failure about yourself  2 3 0 0 3  Trouble concentrating 2 2 0 1 3  Moving slowly or fidgety/restless 0 0 0 0 3  Suicidal thoughts 0 0 0 0 2  PHQ-9 Score 11 16 1 4 20   Difficult doing work/chores Somewhat difficult Somewhat difficult Not difficult at all Not difficult at all Extremely dIfficult   Interpretation of Total Score  Total Score Depression Severity:  1-4 = Minimal depression, 5-9 = Mild depression, 10-14 = Moderate depression, 15-19 = Moderately severe depression, 20-27 = Severe depression   Psychosocial Evaluation and Intervention:  Psychosocial Evaluation - 11/08/23 1433       Psychosocial Evaluation & Interventions   Interventions Encouraged to exercise with the program and follow exercise prescription;Relaxation education;Stress management education    Comments Samanthajo sometimes gets depressed because she cannot breatj at times. She is unable to things that she wants to  due to her COPD and she takes Zoloft  to help. She can look to her husband and grandchildren for support.    Expected Outcomes Short: Start [facility] to help with mood. Long: Maintain a healthy mental state    Continue Psychosocial Services  Follow up required by staff          Psychosocial Re-Evaluation:  Psychosocial Re-Evaluation     Row Name 01/17/24 9061 02/08/24 1007 03/21/24 1008         Psychosocial Re-Evaluation   Current issues with Current Psychotropic Meds None Identified Current Depression;Current Sleep Concerns     Comments Reviewed patient health questionnaire (PHQ-9) with patient for follow up. Previously, patients score indicated signs/symptoms of depression.  Reviewed to see if patient is improving symptom wise while in program.  Score improved and patient states that it is because she is feeling better and able to exercise. Reviewed patient health questionnaire (PHQ-9) with patient for follow up. Previously, patients score indicated signs/symptoms of depression.  Reviewed to see if patient is improving symptom wise while in program.  Score improved and patient states that it is because she has been feeling better physically and is improving her mental state. Mariavictoria reports she has improved her mental health while at the program but still has moments where she feels anxious or depressed about her health. She likes coming to the rehab program and she knows she is nearing the end of her program stay. Encouraged her to conitnue to walk and do similar chair exercises done here at rehab when she is home. She sleeps well most nights but says sometimes, like last night she didnt sleep very well.     Expected Outcomes Short: Continue to attend [facility] regularly for regular exercise and social engagement. Long: Continue to improve symptoms and manage a positive  mental state. Short: Continue to attend [facility] regularly for regular exercise and social engagement. Long: Continue to improve  symptoms and manage a positive mental state. STG: Graduate. LTG: Continue to improve symptoms and manage a positive mental state.     Interventions Encouraged to attend Pulmonary Rehabilitation for the exercise Encouraged to attend Pulmonary Rehabilitation for the exercise Encouraged to attend Pulmonary Rehabilitation for the exercise     Continue Psychosocial Services  Follow up required by staff Follow up required by staff Follow up required by staff        Psychosocial Discharge (Final Psychosocial Re-Evaluation):  Psychosocial Re-Evaluation - 03/21/24 1008       Psychosocial Re-Evaluation   Current issues with Current Depression;Current Sleep Concerns    Comments Donnisha reports she has improved her mental health while at the program but still has moments where she feels anxious or depressed about her health. She likes coming to the rehab program and she knows she is nearing the end of her program stay. Encouraged her to conitnue to walk and do similar chair exercises done here at rehab when she is home. She sleeps well most nights but says sometimes, like last night she didnt sleep very well.    Expected Outcomes STG: Graduate. LTG: Continue to improve symptoms and manage a positive mental state.    Interventions Encouraged to attend Pulmonary Rehabilitation for the exercise    Continue Psychosocial Services  Follow up required by staff          Education: Education Goals: Education classes will be provided on a weekly basis, covering required topics. Participant will state understanding/return demonstration of topics presented.  Learning Barriers/Preferences:  Learning Barriers/Preferences - 11/08/23 1430       Learning Barriers/Preferences   Learning Barriers None    Learning Preferences None          General Pulmonary Education Topics:  Infection Prevention: - Provides verbal and written material to individual with discussion of infection control including proper hand  washing and proper equipment cleaning during exercise session. Flowsheet Row Pulmonary Rehab from 11/08/2023 in [facility]  Date 11/08/23  Educator [facility]  Instruction Review Code 1- Verbalizes Understanding    Falls Prevention: - Provides verbal and written material to individual with discussion of falls prevention and safety. Flowsheet Row Pulmonary Rehab from 11/08/2023 in [facility]monary Re[facility]  Date 11/08/23  Educator [facility]  Instruction Review Code 1- Verbalizes Understanding    Chronic Lung Disease Review: - Group verbal instruction with posters, models, PowerPoint presentations and videos,  to review new updates, new respiratory medications, new advancements in procedures and treatments. Providing information on websites and 800 numbers for continued self-education. Includes information about supplement oxygen, available portable oxygen systems, continuous and intermittent flow rates, oxygen safety, concentrators, and Medicare reimbursement for oxygen. Explanation of Pulmonary Drugs, including class, frequency, complications, importance of spacers, rinsing mouth after steroid MDI's, and proper cleaning methods for nebulizers. Review of basic lung anatomy and physiology related to function, structure, and complications of lung disease. Review of risk factors. Discussion about methods for diagnosing sleep apnea and types of masks and machines for OSA. Includes a review of the use of types of environmental controls: home humidity, furnaces, filters, dust mite/pet prevention, HEPA vacuums. Discussion about weather changes, air quality and the benefits of nasal washing. Instruction on Warning signs, infection symptoms, calling MD promptly, preventive modes, and value of vaccinations. Review of effective airway clearance, coughing and/or vibration techniques. Emphasizing  that all should Create an Action Plan. Written material provided at class time.   AED/CPR: - Group  verbal and written instruction with the use of models to demonstrate the basic use of the AED with the basic ABC's of resuscitation.    Tests and Procedures:  - Group verbal and visual presentation and models provide information about basic cardiac anatomy and function. Reviews the testing methods done to diagnose heart disease and the outcomes of the test results. Describes the treatment choices: Medical Management, Angioplasty, or Coronary Bypass Surgery for treating various heart conditions including Myocardial Infarction, Angina, Valve Disease, and Cardiac Arrhythmias.  Written material provided at class time.   Medication Safety: - Group verbal and visual instruction to review commonly prescribed medications for heart and lung disease. Reviews the medication, class of the drug, and side effects. Includes the steps to properly store meds and maintain the prescription regimen.  Written material given at graduation.   Other: -Provides group and verbal instruction on various topics (see comments)   Knowledge Questionnaire Score:  Knowledge Questionnaire Score - 03/14/24 1009       Knowledge Questionnaire Score   Pre Score 12/18    Post Score 15/18           Core Components/Risk Factors/Patient Goals at Admission:  Personal Goals and Risk Factors at Admission - 11/08/23 1429       Core Components/Risk Factors/Patient Goals on Admission    Weight Management Yes;Weight Gain    Intervention Weight Management: Develop a combined nutrition and exercise program designed to reach desired caloric intake, while maintaining appropriate intake of nutrient and fiber, sodium and fats, and appropriate energy expenditure required for the weight goal.;Weight Management: Provide education and appropriate resources to help participant work on and attain dietary goals.;Obesity: Provide education and appropriate resources to help participant work on and attain dietary goals.    Expected Outcomes Short  Term: Continue to assess and modify interventions until short term weight is achieved;Weight Loss: Understanding of general recommendations for a balanced deficit meal plan, which promotes 1-2 lb weight loss per week and includes a negative energy balance of [phone] kcal/d;Understanding recommendations for meals to include 15-35% energy as protein, 25-35% energy from fat, 35-60% energy from carbohydrates, less than 200mg  of dietary cholesterol, 20-35 gm of total fiber daily;Understanding of distribution of calorie intake throughout the day with the consumption of 4-5 meals/snacks;Weight Gain: Understanding of general recommendations for a high calorie, high protein meal plan that promotes weight gain by distributing calorie intake throughout the day with the consumption for 4-5 meals, snacks, and/or supplements    Improve shortness of breath with ADL's Yes    Intervention Provide education, individualized exercise plan and daily activity instruction to help decrease symptoms of SOB with activities of daily living.    Expected Outcomes Short Term: Improve cardiorespiratory fitness to achieve a reduction of symptoms when performing ADLs;Long Term: Be able to perform more ADLs without symptoms or delay the onset of symptoms    Hypertension Yes    Intervention Provide education on lifestyle modifcations including regular physical activity/exercise, weight management, moderate sodium restriction and increased consumption of fresh fruit, vegetables, and low fat dairy, alcohol moderation, and smoking cessation.;Monitor prescription use compliance.    Expected Outcomes Short Term: Continued assessment and intervention until BP is < 140/107mm HG in hypertensive participants. < 130/72mm HG in hypertensive participants with diabetes, heart failure or chronic kidney disease.;Long Term: Maintenance of blood pressure at goal levels.    Lipids  Yes    Intervention Provide education and support for participant on nutrition &  aerobic/resistive exercise along with prescribed medications to achieve LDL 70mg , HDL >40mg .    Expected Outcomes Short Term: Participant states understanding of desired cholesterol values and is compliant with medications prescribed. Participant is following exercise prescription and nutrition guidelines.;Long Term: Cholesterol controlled with medications as prescribed, with individualized exercise RX and with personalized nutrition plan. Value goals: LDL < 70mg , HDL > 40 mg.          Education:Diabetes - Individual verbal and written instruction to review signs/symptoms of diabetes, desired ranges of glucose level fasting, after meals and with exercise. Acknowledge that pre and post exercise glucose checks will be done for 3 sessions at entry of program.   Know Your Numbers and Heart Failure: - Group verbal and visual instruction to discuss disease risk factors for cardiac and pulmonary disease and treatment options.  Reviews associated critical values for Overweight/Obesity, Hypertension, Cholesterol, and Diabetes.  Discusses basics of heart failure: signs/symptoms and treatments.  Introduces Heart Failure Zone chart for action plan for heart failure. Written material provided at class time.   Core Components/Risk Factors/Patient Goals Review:   Goals and Risk Factor Review     Row Name 12/14/23 0951 01/17/24 0931 02/08/24 1008 03/21/24 1012       Core Components/Risk Factors/Patient Goals Review   Personal Goals Review Improve shortness of breath with ADL's Other Other Improve shortness of breath with ADL's    Review Spoke to patient about their shortness of breath and what they can do to improve. Patient has been informed of breathing techniques when starting the program. Patient is informed to tell staff if they have had any med changes and that certain meds they are taking or not taking can be causing shortness of breath. Diaphragmatic and PLB breathing explained and performed with  patient. Patient has a better understanding of how to do these exercises to help with breathing performance and relaxation. Patient performed breathing techniques adequately and to practice further at home. Zola states that she has no questions about her medications or vital signs. She has been doing well in the program and will follow up with patient over the next few weeks. Sharlynn reports she is bale to breath and moe better since starting the program and wants to make sure she can continue to improve even after graduating from rehab program    Expected Outcomes Short: Attend [facility] regularly to improve shortness of breath with ADL's. Long: maintain independence with ADL's Short: practice PLB and diaphragmatic breathing at home. Long: Use PLB and diaphragmatic breathing independently post [facility]. Short: continue to attend [facility]. Long: maintain exercise independently. STG: Graduate and continue home exercise plan established by EP. LTG: maintain exercise independently.       Core Components/Risk Factors/Patient Goals at Discharge (Final Review):   Goals and Risk Factor Review - 03/21/24 1012       Core Components/Risk Factors/Patient Goals Review   Personal Goals Review Improve shortness of breath with ADL's    Review Amerah reports she is bale to breath and moe better since starting the program and wants to make sure she can continue to improve even after graduating from rehab program    Expected Outcomes STG: Graduate and continue home exercise plan established by EP. LTG: maintain exercise independently.          ITP Comments:  ITP Comments     Row Name 11/08/23 1433 11/09/23 1110 11/16/23 0944 01/03/24  [id number] 01/31/24 0924   ITP Comments Virtual Visit completed. Patient informed on EP and RD appointment and 6 Minute walk test. Patient also informed of patient health questionnaires on My Chart. Patient Verbalizes understanding. Visit diagnosis can be found in [facility] 10/04/2023. Completed  and gym orientation for respiratory care services. Initial ITP created and sent for review to Dr. Faud Aleskerov, Medical Director. First full day of exercise!  Patient was oriented to gym and equipment including functions, settings, policies, and procedures.  Patient's individual exercise prescription and treatment plan were reviewed.  All starting workloads were established based on the results of the 6 minute walk test done at initial orientation visit.  The plan for exercise progression was also introduced and progression will be customized based on patient's performance and goals. 30 Day review completed. Medical Director ITP review done, changes made as directed, and signed approval by Medical Director. new to program. 30 Day review completed. Medical Director ITP review done; changes made as directed and signed approval by Medical Director.    Row Name 02/28/24 1356 03/27/24 1008 03/28/24 0941       ITP Comments 30 Day review completed. Medical Director ITP review done; changes made as directed and signed approval by Medical Director. 30 Day review completed. Medical Director ITP review done; changes made as directed and signed approval by Medical Director. Taj graduated today from  rehab with 36 sessions completed.  Details of the patient's exercise prescription and what He needs to do in order to continue the prescription and progress were discussed with patient.  Patient was given a copy of prescription and goals.  Patient verbalized understanding. Yizel plans to continue to exercise by walking and doing resistance/weights.        Comments: Discharge ITP

## 2024-03-28 NOTE — Progress Notes (Signed)
 Discharge Summary   Taylor Chambers  [birth year]   [name] graduated today from  rehab with 36 sessions completed.  Details of the patient's exercise prescription and what Taylor Chambers needs to do in order to continue the prescription and progress were discussed with patient.  Patient was given a copy of prescription and goals.  Patient verbalized understanding. Taylor Chambers plans to continue to exercise by walking and doing resistance/weights.   6 Minute Walk     Row Name 11/09/23 1117 03/13/24 0949       6 Minute Walk   Phase Initial Discharge    Distance 455 feet 900 feet    Distance % Change -- 97.8 %    Distance Feet Change -- 445 ft    Walk Time 6 minutes 6 minutes    # of Rest Breaks 0 0    MPH 0.86 1.7    METS 1.68 2.37    RPE 15 12    Perceived Dyspnea  4 1    VO2 Peak 5.88 8.31    Symptoms Yes (comment) Yes (comment)    Comments used walker used walker    Resting HR 85 bpm 77 bpm    Resting BP 132/68 158/74    Resting Oxygen Saturation  95 % 92 %    Exercise Oxygen Saturation  during 6 min walk 90 % 90 %    Max Ex. HR 105 bpm 92 bpm    Max Ex. BP 154/74 170/72    2 Minute Post BP 142/72 140/68      Interval HR   1 Minute HR 102 85    2 Minute HR 105 92    3 Minute HR 104 90    4 Minute HR 105 87    5 Minute HR 101 88    6 Minute HR 98 92    2 Minute Post HR 93 79    Interval Heart Rate? Yes Yes      Interval Oxygen   Interval Oxygen? Yes Yes    Baseline Oxygen Saturation % 95 % 92 %    1 Minute Oxygen Saturation % 91 % 91 %    1 Minute Liters of Oxygen 3 L  pulsed 3 L    2 Minute Oxygen Saturation % 92 % 92 %    2 Minute Liters of Oxygen 3 L 3 L    3 Minute Oxygen Saturation % 91 % 93 %    3 Minute Liters of Oxygen 3 L 3 L    4 Minute Oxygen Saturation % 90 % 90 %    4 Minute Liters of Oxygen 3 L 3 L    5 Minute Oxygen Saturation % 91 % 91 %    5 Minute Liters of Oxygen 3 L 3 L    6 Minute Oxygen Saturation % 91 % 90 %    6 Minute Liters of Oxygen 3 L 3 L    2  Minute Post Oxygen Saturation % 95 % 95 %    2 Minute Post Liters of Oxygen 3 L 3 L

## 2024-04-03 ENCOUNTER — Encounter

## 2024-04-04 ENCOUNTER — Encounter

## 2024-06-03 ENCOUNTER — Other Ambulatory Visit: Payer: Self-pay | Admitting: Cardiovascular Disease

## 2024-06-03 DIAGNOSIS — I480 Paroxysmal atrial fibrillation: Secondary | ICD-10-CM

## 2024-06-03 NOTE — Telephone Encounter (Signed)
 Prescription refill request for Eliquis  received. Indication:[facility]IB Last office visit:7/25 Scr: 0.78  7/25 Age:78 Weight:45.1  kg  Prescription refilled
